# Patient Record
Sex: Male | Born: 1940 | Race: White | Hispanic: No | Smoking: Former smoker
Health system: Southern US, Community
[De-identification: ages and names within clinical notes are randomized; demographics above are authoritative.]

## PROBLEM LIST (undated history)

## (undated) DIAGNOSIS — I2510 Atherosclerotic heart disease of native coronary artery without angina pectoris: Secondary | ICD-10-CM

## (undated) DIAGNOSIS — Z8709 Personal history of other diseases of the respiratory system: Secondary | ICD-10-CM

## (undated) DIAGNOSIS — N183 Chronic kidney disease, stage 3 unspecified: Secondary | ICD-10-CM

## (undated) DIAGNOSIS — M1990 Unspecified osteoarthritis, unspecified site: Secondary | ICD-10-CM

## (undated) DIAGNOSIS — I10 Essential (primary) hypertension: Secondary | ICD-10-CM

## (undated) DIAGNOSIS — I714 Abdominal aortic aneurysm, without rupture, unspecified: Secondary | ICD-10-CM

## (undated) DIAGNOSIS — C649 Malignant neoplasm of unspecified kidney, except renal pelvis: Secondary | ICD-10-CM

## (undated) DIAGNOSIS — G4730 Sleep apnea, unspecified: Secondary | ICD-10-CM

## (undated) DIAGNOSIS — J449 Chronic obstructive pulmonary disease, unspecified: Secondary | ICD-10-CM

## (undated) DIAGNOSIS — G43909 Migraine, unspecified, not intractable, without status migrainosus: Secondary | ICD-10-CM

## (undated) DIAGNOSIS — B029 Zoster without complications: Secondary | ICD-10-CM

## (undated) DIAGNOSIS — E785 Hyperlipidemia, unspecified: Secondary | ICD-10-CM

## (undated) DIAGNOSIS — F32A Depression, unspecified: Secondary | ICD-10-CM

## (undated) DIAGNOSIS — I639 Cerebral infarction, unspecified: Secondary | ICD-10-CM

## (undated) DIAGNOSIS — K219 Gastro-esophageal reflux disease without esophagitis: Secondary | ICD-10-CM

## (undated) DIAGNOSIS — F329 Major depressive disorder, single episode, unspecified: Secondary | ICD-10-CM

## (undated) DIAGNOSIS — I5022 Chronic systolic (congestive) heart failure: Secondary | ICD-10-CM

## (undated) DIAGNOSIS — C4491 Basal cell carcinoma of skin, unspecified: Secondary | ICD-10-CM

## (undated) DIAGNOSIS — I482 Chronic atrial fibrillation, unspecified: Secondary | ICD-10-CM

## (undated) DIAGNOSIS — Z87442 Personal history of urinary calculi: Secondary | ICD-10-CM

## (undated) DIAGNOSIS — I219 Acute myocardial infarction, unspecified: Secondary | ICD-10-CM

## (undated) HISTORY — DX: Hyperlipidemia, unspecified: E78.5

## (undated) HISTORY — DX: Depression, unspecified: F32.A

## (undated) HISTORY — DX: Essential (primary) hypertension: I10

## (undated) HISTORY — DX: Malignant neoplasm of unspecified kidney, except renal pelvis: C64.9

## (undated) HISTORY — DX: Major depressive disorder, single episode, unspecified: F32.9

## (undated) HISTORY — DX: Atherosclerotic heart disease of native coronary artery without angina pectoris: I25.10

## (undated) HISTORY — DX: Abdominal aortic aneurysm, without rupture, unspecified: I71.40

## (undated) HISTORY — DX: Gastro-esophageal reflux disease without esophagitis: K21.9

## (undated) HISTORY — PX: LITHOTRIPSY: SUR834

## (undated) HISTORY — DX: Personal history of other diseases of the respiratory system: Z87.09

## (undated) HISTORY — DX: Abdominal aortic aneurysm, without rupture: I71.4

## (undated) HISTORY — PX: COLONOSCOPY: SHX174

## (undated) HISTORY — PX: KNEE ARTHROSCOPY: SHX127

---

## 1962-05-08 HISTORY — PX: TONSILLECTOMY: SUR1361

## 1997-11-05 ENCOUNTER — Ambulatory Visit (HOSPITAL_COMMUNITY): Admission: RE | Admit: 1997-11-05 | Discharge: 1997-11-05 | Payer: Self-pay | Admitting: Orthopedic Surgery

## 1997-11-17 ENCOUNTER — Ambulatory Visit (HOSPITAL_COMMUNITY): Admission: RE | Admit: 1997-11-17 | Discharge: 1997-11-17 | Payer: Self-pay | Admitting: Gastroenterology

## 2002-09-22 LAB — HM COLONOSCOPY: HM Colonoscopy: NORMAL

## 2004-01-06 ENCOUNTER — Emergency Department (HOSPITAL_COMMUNITY): Admission: EM | Admit: 2004-01-06 | Discharge: 2004-01-06 | Payer: Self-pay | Admitting: *Deleted

## 2004-05-08 DIAGNOSIS — I219 Acute myocardial infarction, unspecified: Secondary | ICD-10-CM

## 2004-05-08 HISTORY — DX: Acute myocardial infarction, unspecified: I21.9

## 2004-07-01 ENCOUNTER — Encounter: Admission: RE | Admit: 2004-07-01 | Discharge: 2004-07-01 | Payer: Self-pay | Admitting: *Deleted

## 2004-07-04 ENCOUNTER — Ambulatory Visit (HOSPITAL_COMMUNITY): Admission: RE | Admit: 2004-07-04 | Discharge: 2004-07-06 | Payer: Self-pay | Admitting: *Deleted

## 2004-07-04 HISTORY — PX: CARDIAC CATHETERIZATION: SHX172

## 2004-07-05 HISTORY — PX: CORONARY ANGIOPLASTY WITH STENT PLACEMENT: SHX49

## 2008-09-30 ENCOUNTER — Encounter: Admission: RE | Admit: 2008-09-30 | Discharge: 2008-11-05 | Payer: Self-pay | Admitting: Orthopedic Surgery

## 2008-11-11 ENCOUNTER — Ambulatory Visit: Payer: Self-pay | Admitting: Internal Medicine

## 2008-11-11 DIAGNOSIS — I2510 Atherosclerotic heart disease of native coronary artery without angina pectoris: Secondary | ICD-10-CM | POA: Insufficient documentation

## 2008-11-11 DIAGNOSIS — E785 Hyperlipidemia, unspecified: Secondary | ICD-10-CM

## 2008-11-11 DIAGNOSIS — Z87442 Personal history of urinary calculi: Secondary | ICD-10-CM

## 2008-11-11 DIAGNOSIS — K219 Gastro-esophageal reflux disease without esophagitis: Secondary | ICD-10-CM | POA: Insufficient documentation

## 2008-11-11 DIAGNOSIS — Z85828 Personal history of other malignant neoplasm of skin: Secondary | ICD-10-CM

## 2008-11-11 DIAGNOSIS — I10 Essential (primary) hypertension: Secondary | ICD-10-CM | POA: Insufficient documentation

## 2008-11-11 DIAGNOSIS — F329 Major depressive disorder, single episode, unspecified: Secondary | ICD-10-CM

## 2008-11-18 ENCOUNTER — Ambulatory Visit: Payer: Self-pay | Admitting: Cardiology

## 2008-11-18 ENCOUNTER — Encounter: Payer: Self-pay | Admitting: Cardiology

## 2008-11-26 ENCOUNTER — Telehealth (INDEPENDENT_AMBULATORY_CARE_PROVIDER_SITE_OTHER): Payer: Self-pay | Admitting: Radiology

## 2008-11-30 ENCOUNTER — Ambulatory Visit: Payer: Self-pay

## 2008-11-30 ENCOUNTER — Encounter: Payer: Self-pay | Admitting: Cardiovascular Disease

## 2009-03-01 ENCOUNTER — Telehealth: Payer: Self-pay | Admitting: Internal Medicine

## 2009-03-13 ENCOUNTER — Emergency Department (HOSPITAL_BASED_OUTPATIENT_CLINIC_OR_DEPARTMENT_OTHER): Admission: EM | Admit: 2009-03-13 | Discharge: 2009-03-13 | Payer: Self-pay | Admitting: Emergency Medicine

## 2009-03-13 ENCOUNTER — Ambulatory Visit: Payer: Self-pay | Admitting: Diagnostic Radiology

## 2009-04-05 ENCOUNTER — Telehealth: Payer: Self-pay | Admitting: Internal Medicine

## 2009-05-08 HISTORY — PX: ABDOMINAL AORTIC ANEURYSM REPAIR: SUR1152

## 2009-05-13 ENCOUNTER — Ambulatory Visit: Payer: Self-pay | Admitting: Internal Medicine

## 2009-05-13 ENCOUNTER — Ambulatory Visit (HOSPITAL_BASED_OUTPATIENT_CLINIC_OR_DEPARTMENT_OTHER): Admission: RE | Admit: 2009-05-13 | Discharge: 2009-05-13 | Payer: Self-pay | Admitting: Internal Medicine

## 2009-05-13 ENCOUNTER — Ambulatory Visit: Payer: Self-pay | Admitting: Diagnostic Radiology

## 2009-05-13 DIAGNOSIS — R066 Hiccough: Secondary | ICD-10-CM | POA: Insufficient documentation

## 2009-05-13 DIAGNOSIS — R05 Cough: Secondary | ICD-10-CM | POA: Insufficient documentation

## 2009-05-14 ENCOUNTER — Ambulatory Visit: Payer: Self-pay | Admitting: Internal Medicine

## 2009-05-14 ENCOUNTER — Ambulatory Visit: Payer: Self-pay | Admitting: Diagnostic Radiology

## 2009-05-14 ENCOUNTER — Telehealth: Payer: Self-pay | Admitting: Internal Medicine

## 2009-05-14 ENCOUNTER — Ambulatory Visit (HOSPITAL_BASED_OUTPATIENT_CLINIC_OR_DEPARTMENT_OTHER): Admission: RE | Admit: 2009-05-14 | Discharge: 2009-05-14 | Payer: Self-pay | Admitting: Internal Medicine

## 2009-05-14 LAB — CONVERTED CEMR LAB
AST: 33 units/L (ref 0–37)
Albumin: 4.1 g/dL (ref 3.5–5.2)
BUN: 33 mg/dL — ABNORMAL HIGH (ref 6–23)
Basophils Relative: 1 % (ref 0–1)
Bilirubin, Direct: 0.2 mg/dL (ref 0.0–0.3)
Cholesterol: 160 mg/dL (ref 0–200)
Creatinine, Ser: 1.69 mg/dL — ABNORMAL HIGH (ref 0.40–1.50)
Eosinophils Absolute: 0.1 10*3/uL (ref 0.0–0.7)
Hemoglobin: 13 g/dL (ref 13.0–17.0)
MCV: 91.1 fL (ref 78.0–100.0)
Neutro Abs: 3.8 10*3/uL (ref 1.7–7.7)
Neutrophils Relative %: 60 % (ref 43–77)
RBC: 4.48 M/uL (ref 4.22–5.81)
RDW: 14.9 % (ref 11.5–15.5)
Sodium: 143 meq/L (ref 135–145)
TSH: 1.678 microintl units/mL (ref 0.350–4.500)
Total CHOL/HDL Ratio: 3.6
Total Protein: 7.1 g/dL (ref 6.0–8.3)
Triglycerides: 89 mg/dL (ref <150)
VLDL: 18 mg/dL (ref 0–40)
WBC: 6.2 10*3/uL (ref 4.0–10.5)

## 2009-05-17 ENCOUNTER — Encounter: Payer: Self-pay | Admitting: Internal Medicine

## 2009-05-28 ENCOUNTER — Ambulatory Visit: Payer: Self-pay | Admitting: Interventional Radiology

## 2009-05-28 ENCOUNTER — Ambulatory Visit: Payer: Self-pay | Admitting: Internal Medicine

## 2009-05-28 ENCOUNTER — Ambulatory Visit (HOSPITAL_BASED_OUTPATIENT_CLINIC_OR_DEPARTMENT_OTHER): Admission: RE | Admit: 2009-05-28 | Discharge: 2009-05-28 | Payer: Self-pay | Admitting: Internal Medicine

## 2009-05-28 DIAGNOSIS — I714 Abdominal aortic aneurysm, without rupture: Secondary | ICD-10-CM

## 2009-05-28 DIAGNOSIS — N259 Disorder resulting from impaired renal tubular function, unspecified: Secondary | ICD-10-CM

## 2009-05-28 LAB — CONVERTED CEMR LAB
Bilirubin Urine: NEGATIVE
Calcium: 9 mg/dL (ref 8.4–10.5)
Glucose, Bld: 86 mg/dL (ref 70–99)
Ketones, ur: NEGATIVE mg/dL
Leukocytes, UA: NEGATIVE
Nitrite: NEGATIVE
Potassium: 3.7 meq/L (ref 3.5–5.3)
Protein, ur: NEGATIVE mg/dL
Sodium: 136 meq/L (ref 135–145)
Urobilinogen, UA: 0.2 (ref 0.0–1.0)

## 2009-05-29 ENCOUNTER — Encounter: Payer: Self-pay | Admitting: Internal Medicine

## 2009-05-31 ENCOUNTER — Ambulatory Visit: Payer: Self-pay | Admitting: Surgery

## 2009-05-31 ENCOUNTER — Encounter: Admission: RE | Admit: 2009-05-31 | Discharge: 2009-05-31 | Payer: Self-pay | Admitting: Surgery

## 2009-05-31 ENCOUNTER — Encounter: Payer: Self-pay | Admitting: Internal Medicine

## 2009-06-02 ENCOUNTER — Telehealth (INDEPENDENT_AMBULATORY_CARE_PROVIDER_SITE_OTHER): Payer: Self-pay | Admitting: *Deleted

## 2009-06-03 ENCOUNTER — Inpatient Hospital Stay (HOSPITAL_COMMUNITY): Admission: AD | Admit: 2009-06-03 | Discharge: 2009-06-04 | Payer: Self-pay | Admitting: Surgery

## 2009-06-03 ENCOUNTER — Ambulatory Visit: Payer: Self-pay | Admitting: Surgery

## 2009-06-04 ENCOUNTER — Telehealth: Payer: Self-pay | Admitting: Internal Medicine

## 2009-06-18 ENCOUNTER — Telehealth: Payer: Self-pay | Admitting: Internal Medicine

## 2009-06-28 ENCOUNTER — Ambulatory Visit: Payer: Self-pay | Admitting: Surgery

## 2009-06-28 ENCOUNTER — Encounter: Payer: Self-pay | Admitting: Cardiology

## 2009-06-28 ENCOUNTER — Encounter: Admission: RE | Admit: 2009-06-28 | Discharge: 2009-06-28 | Payer: Self-pay | Admitting: Surgery

## 2009-06-29 ENCOUNTER — Ambulatory Visit: Payer: Self-pay | Admitting: Internal Medicine

## 2009-06-29 LAB — CONVERTED CEMR LAB
Chloride: 101 meq/L (ref 96–112)
Creatinine, Ser: 1.55 mg/dL — ABNORMAL HIGH (ref 0.40–1.50)
Potassium: 4.1 meq/L (ref 3.5–5.3)

## 2009-06-30 ENCOUNTER — Encounter: Payer: Self-pay | Admitting: Internal Medicine

## 2009-08-05 ENCOUNTER — Ambulatory Visit: Payer: Self-pay | Admitting: Vascular Surgery

## 2009-08-30 ENCOUNTER — Ambulatory Visit: Payer: Self-pay | Admitting: Surgery

## 2009-09-27 ENCOUNTER — Ambulatory Visit: Payer: Self-pay | Admitting: Surgery

## 2009-09-27 ENCOUNTER — Encounter: Payer: Self-pay | Admitting: Internal Medicine

## 2009-09-27 ENCOUNTER — Encounter: Payer: Self-pay | Admitting: Cardiology

## 2009-10-13 ENCOUNTER — Encounter: Payer: Self-pay | Admitting: Internal Medicine

## 2009-12-14 ENCOUNTER — Encounter: Payer: Self-pay | Admitting: Internal Medicine

## 2009-12-21 ENCOUNTER — Ambulatory Visit: Payer: Self-pay | Admitting: Internal Medicine

## 2009-12-21 DIAGNOSIS — N400 Enlarged prostate without lower urinary tract symptoms: Secondary | ICD-10-CM | POA: Insufficient documentation

## 2009-12-27 ENCOUNTER — Encounter: Admission: RE | Admit: 2009-12-27 | Discharge: 2009-12-27 | Payer: Self-pay | Admitting: Surgery

## 2010-02-22 ENCOUNTER — Encounter: Payer: Self-pay | Admitting: Internal Medicine

## 2010-02-22 ENCOUNTER — Telehealth: Payer: Self-pay | Admitting: Internal Medicine

## 2010-02-22 LAB — CONVERTED CEMR LAB
ALT: 13 units/L (ref 0–53)
BUN: 29 mg/dL — ABNORMAL HIGH (ref 6–23)
Chloride: 103 meq/L (ref 96–112)
Creatinine, Ser: 1.53 mg/dL — ABNORMAL HIGH (ref 0.40–1.50)
Glucose, Bld: 95 mg/dL (ref 70–99)
Sodium: 139 meq/L (ref 135–145)

## 2010-02-25 ENCOUNTER — Ambulatory Visit: Payer: Self-pay | Admitting: Internal Medicine

## 2010-02-25 LAB — CONVERTED CEMR LAB
HDL: 37 mg/dL — ABNORMAL LOW (ref >39)
VLDL: 21 mg/dL (ref 0–40)

## 2010-05-12 ENCOUNTER — Ambulatory Visit
Admission: RE | Admit: 2010-05-12 | Discharge: 2010-05-12 | Payer: Self-pay | Source: Home / Self Care | Attending: Internal Medicine | Admitting: Internal Medicine

## 2010-05-12 LAB — CONVERTED CEMR LAB
BUN: 25 mg/dL — ABNORMAL HIGH (ref 6–23)
Calcium: 8.8 mg/dL (ref 8.4–10.5)
Chloride: 101 meq/L (ref 96–112)
Creatinine, Ser: 1.4 mg/dL (ref 0.40–1.50)
Potassium: 3.8 meq/L (ref 3.5–5.3)

## 2010-05-13 ENCOUNTER — Encounter: Payer: Self-pay | Admitting: Internal Medicine

## 2010-05-27 ENCOUNTER — Telehealth: Payer: Self-pay | Admitting: Internal Medicine

## 2010-06-09 NOTE — Progress Notes (Signed)
Summary: Pending ABN  Phone Note Other Incoming   Caller: Katrina @ [facility] ext [phone] Summary of Call: Received call from [place] in the lab stating pt had labs drawn today but an ABN printed stating labs were denied as too frequent. I forwarded a copy of the ABN to you for review. Please let me know what needs to be done with the blood that was drawn. Katrina is holding it until she hears back from [place].  Please advise. Nicki Guadalajara Fergerson CMA Duncan Dull)  February 22, 2010 11:27 AM

## 2010-06-09 NOTE — Progress Notes (Signed)
Summary: Lab Results  Phone Note Outgoing Call   Summary of Call: call pt - CT of chest negative for signs of malignancy.  It shows changes suggest of COPD.  inform pt kidney function blood test is slightly elevated.  please make sure he is not taking any NSAIDs.  I suggest he increase fluid intake, hold triamternene/hctz and f/u in 2 weeks Initial call taken by: D. Thomos Lemons DO,  May 14, 2009 1:04 PM  Follow-up for Phone Call        attempted to contact patient at [phone] no answer, detailed voice message left advising patient per Dr Artist Pais instructions. Message was left for patient to call back and schedule 2 week follow up Follow-up by: Glendell Docker CMA,  May 14, 2009 1:16 PM    New/Updated Medications: TRIAMTERENE-HCTZ 37.5-25 MG TABS (TRIAMTERENE-HCTZ) Take 1 tablet by mouth every morning (HOLD)

## 2010-06-09 NOTE — Assessment & Plan Note (Signed)
Summary: 2 month follow up/mhf rsc with pt from [place]/mhf   Vital Signs:  Patient profile:   70 year old male Height:      71 inches Weight:      228 pounds BMI:     31.91 O2 Sat:      93 % on Room air Temp:     98.2 degrees F oral Pulse rate:   73 / minute Resp:     18 per minute BP sitting:   110 / 80  (right arm) Cuff size:   large  Vitals Entered By: Glendell Docker CMA (May 12, 2010 10:42 AM)  O2 Flow:  Room air CC: 2 Month Follow up  Is Patient Diabetic? No Pain Assessment Patient in pain? no      Comments refill on medication to mail order   Primary Care Provider:  D. Thomos Lemons DO  CC:  2 Month Follow up .  History of Present Illness:  Hypertension Follow-Up      This is a 70 year old man who presents for Hypertension follow-up.  The patient denies lightheadedness and headaches.  The patient denies the following associated symptoms: chest pain.  Compliance with medications (by patient report) has been near 100%.  The patient reports that dietary compliance has been fair.  The patient reports exercising occasionally.    hyperlipidemia -stable.  denies myalgias  Preventive Screening-Counseling & Management  Alcohol-Tobacco     Smoking Status: quit  Allergies: 1)  ! * Intolerance Ace Inhibitors  Past History:  Past Medical History: Current Problems:  HYPERTENSION (ICD-401.9) HYPERLIPIDEMIA (ICD-272.4)   CORONARY ARTERY DISEASE (ICD-414.00)  FAMILY HISTORY OF CAD MALE 1ST DEGREE RELATIVE <60 (ICD-V16.49) NEPHROLITHIASIS, HX OF (ICD-V13.01) GERD (ICD-530.81) DEPRESSION (ICD-311)  SKIN CANCER, HX OF (ICD-V10.83) H/O pnemothorax AAA 9.1 cm - S/P repair 05/2009   Past Surgical History: cardiac Cath 06/2004   PROCEDURE:  Percutaneous coronary intervention, drug-eluting stent implantationproximal left anterior descending. Percutaneous coronary intervention, balloon dilatation only, first diagonal branch.Percutaneous closure right femoral  artery.ANGIOGRAPHY:  The lesion was treated with a complex LAD diagonal stenosis.  The diagonal had an 80% stenosis at its origin and then was patent for about 1 cm and then there was a 70-80% stenosis that extended about 1 cm.  The vessel was moderate in size.  The anterior descending artery itself had a90% diffuse stenosis.  Following balloon dilatation and stent implantation, there was no residual stenosis in the anterior descending artery and there was an 80% stenosis at the origin of the diagonal branch.  There was some dissection, nonpropagating and non-occlusive, seen about 10-15 mm more distally in the diagonal.  TIMI-3 flow was present throughout.HE/MEDQ  D:  07/05/2004  T:  07/06/2004  Job:  [id number] cc:   Tasia Catchings, M.D. [address]  [address] [place] [address] Fax: [phone] Colonoscopy early 30's - negative for polyp  Tonsillectomy Lithotripsy Arthroscopic knee surgery 9.1-cm infrarenal abdominal aortic aneurysm status post stent graft repair.   Social History: Retired Divorced 2 sons 23 &30   Occupation:  Part time sports Corporate investment banker  Smoking Status:  quit (08/24/1980) Packs/Day:  1.0  Caffeine use/day:  3 beverages daily  Does Patient Exercise:  no Alcohol Use - yes  Physical Exam  General:  alert, well-developed, and well-nourished.   Lungs:  normal respiratory effort and normal breath sounds.   Heart:  normal rate, regular rhythm, and no gallop.   Extremities:  trace left pedal edema and trace  right pedal edema.   Neurologic:  cranial nerves II-XII intact and gait normal.   Psych:  normally interactive, good eye contact, not anxious appearing, and not depressed appearing.     Impression & Recommendations:  Problem # 1:  HYPERTENSION (ICD-401.9) Assessment Improved  His updated medication list for this problem includes:    Amlodipine Besylate 10 Mg Tabs (Amlodipine besylate) .Marland Kitchen... Take one half  tablet by mouth daily    Triamterene-hctz 37.5-25 Mg Tabs  (Triamterene-hctz) .Marland Kitchen... Take 1/2  tablet by mouth every morning    Metoprolol Tartrate 100 Mg Tabs (Metoprolol tartrate) .Marland Kitchen... 1 tab by mouth two times a day    Losartan Potassium 25 Mg Tabs (Losartan potassium) ..... One by mouth once daily  Orders: T-Basic Metabolic Panel [id number])  BP today: 110/80 Prior BP: 122/78 (02/25/2010)  Labs Reviewed: K+: 3.9 (02/22/2010) Creat: : 1.53 (02/22/2010)   Chol: 136 (02/25/2010)   HDL: 37 (02/25/2010)   LDL: 78 (02/25/2010)   TG: 106 (02/25/2010)  Problem # 2:  HYPERLIPIDEMIA (ICD-272.4)  His updated medication list for this problem includes:    Simvastatin 20 Mg Tabs (Simvastatin) ..... One by mouth q pm  Labs Reviewed: SGOT: 22 (02/22/2010)   SGPT: 13 (02/22/2010)   HDL:37 (02/25/2010), 44 (05/14/2009)  LDL:78 (02/25/2010), 98 (05/14/2009)  Chol:136 (02/25/2010), 160 (05/14/2009)  Trig:106 (02/25/2010), 89 (05/14/2009)  Problem # 3:  DEPRESSION (ICD-311) Assessment: Unchanged Maintain current medication regimen.  His updated medication list for this problem includes:    Sertraline Hcl 100 Mg Tabs (Sertraline hcl) ..... One by mouth once daily  Complete Medication List: 1)  Multivitamins Tabs (Multiple vitamin) .... Take 1 tablet by mouth once a day 2)  Vitamin C 500 Mg Tabs (Ascorbic acid) .... Take 1 tablet by mouth once a day 3)  Fish Oil 1200 Mg Caps (Omega-3 fatty acids) .... Take 1 tablet by mouth once a day 4)  Amlodipine Besylate 10 Mg Tabs (Amlodipine besylate) .... Take one half  tablet by mouth daily 5)  Isosorbide Dinitrate 20 Mg Tabs (Isosorbide dinitrate) .... Take 1 tablet by mouth three times a day 6)  Triamterene-hctz 37.5-25 Mg Tabs (Triamterene-hctz) .... Take 1/2  tablet by mouth every morning 7)  Aspirin 81 Mg Tabs (Aspirin) .... Take 1 tablet by mouth four times a day 8)  Sertraline Hcl 100 Mg Tabs (Sertraline hcl) .... One by mouth once daily 9)  Metoprolol Tartrate 100 Mg Tabs (Metoprolol tartrate) .Marland Kitchen.. 1 tab  by mouth two times a day 10)  Ranitidine Hcl 150 Mg Tabs (Ranitidine hcl) .... One by mouth bid 11)  Pa Melatonin 5 Mg Tabs (Melatonin) .... Take 1 tab by mouth at bedtime 12)  Simvastatin 20 Mg Tabs (Simvastatin) .... One by mouth q pm 13)  Losartan Potassium 25 Mg Tabs (Losartan potassium) .... One by mouth once daily 14)  Co Q-10 100 Mg Caps (Coenzyme q10) .... Take 1 capsule by mouth once a day  Patient Instructions: 1)  Please schedule a follow-up appointment in 6 months for CPX Prescriptions: SIMVASTATIN 20 MG TABS (SIMVASTATIN) one by mouth q pm  #90 x 1   Entered and Authorized by:   D. Thomos Lemons DO   Signed by:   D. Thomos Lemons DO on 05/12/2010   Method used:   Faxed to ...       [facility] (mail-order)       [address]       [place], [place]  [id number]  Ph: [phone]       Fax: [phone]   RxID:   [id number] METOPROLOL TARTRATE 100 MG TABS (METOPROLOL TARTRATE) 1 tab by mouth two times a day  #180 x 1   Entered and Authorized by:   D. Thomos Lemons DO   Signed by:   D. Thomos Lemons DO on 05/12/2010   Method used:   Faxed to ...       [facility] (mail-order)       [address]       [place], [place]  [id number]       Ph: [phone]       Fax: [phone]   RxID:   [id number] SERTRALINE HCL 100 MG TABS (SERTRALINE HCL) one by mouth once daily  #90 x 1   Entered and Authorized by:   D. Thomos Lemons DO   Signed by:   D. Thomos Lemons DO on 05/12/2010   Method used:   Faxed to ...       [facility] (mail-order)       [address]       [place], [place]  [id number]       Ph: [phone]       Fax: [phone]   RxID:   [id number] TRIAMTERENE-HCTZ 37.5-25 MG TABS (TRIAMTERENE-HCTZ) Take 1/2  tablet by mouth every morning  #45 x 01   Entered and Authorized by:   D. Thomos Lemons DO   Signed by:   D. Thomos Lemons DO on 05/12/2010   Method used:   Faxed to ...       [facility] (mail-order)        [address]       [place], [place]  [id number]       Ph: [phone]       Fax: [phone]   RxID:   [id number] ISOSORBIDE DINITRATE 20 MG TABS (ISOSORBIDE DINITRATE) Take 1 tablet by mouth three times a day  #270 x 1   Entered and Authorized by:   D. Thomos Lemons DO   Signed by:   D. Thomos Lemons DO on 05/12/2010   Method used:   Faxed to ...       [facility] (mail-order)       [address]       [place], [place]  [id number]       Ph: [phone]       Fax: [phone]   RxID:   [id number] AMLODIPINE BESYLATE 10 MG TABS (AMLODIPINE BESYLATE) Take one half  tablet by mouth daily  #45 x 1   Entered and Authorized by:   D. Thomos Lemons DO   Signed by:   D. Thomos Lemons DO on 05/12/2010   Method used:   Faxed to ...       [facility] (mail-order)       [address]       [place], [place]  [id number]       Ph: [phone]       Fax: [phone]   RxID:   [id number]    Orders Added: 1)  T-Basic Metabolic Panel [phone] [id number])  Est. Patient Level III [[address]]    Current Allergies (reviewed today): ! * INTOLERANCE ACE INHIBITORS

## 2010-06-09 NOTE — Medication Information (Signed)
Summary: Nonadherence with Sertraline/[facility]  Nonadherence with Sertraline/[facility]   Imported By: Lanelle Bal 11/03/2009 08:34:10  _____________________________________________________________________  External Attachment:    Type:   Image     Comment:   External Document

## 2010-06-09 NOTE — Letter (Signed)
Summary: [facility]  [facility]   Imported By: Debby Freiberg 11/10/2009 14:47:28  _____________________________________________________________________  External Attachment:    Type:   Image     Comment:   External Document

## 2010-06-09 NOTE — Assessment & Plan Note (Signed)
Summary: 2 wk. f/u on cxr- jr   Vital Signs:  Patient profile:   70 year old male Weight:      225.50 pounds BMI:     31.56 Temp:     97.8 degrees F oral Pulse rate:   85 / minute Pulse rhythm:   regular Resp:     95 per minute BP sitting:   136 / 90  (left arm) Cuff size:   large  Vitals Entered By: Glendell Docker CMA (May 28, 2009 9:27 AM)  O2 Flow:  Room air  Primary Care Provider:  D. Thomos Lemons DO  CC:  2 Week Follow up.  History of Present Illness: 2 Week follow up Hiccups resolved  70 y/o white male with hx of CAD, Htn, and tobacco abuse for follow up. Hiccups resolved.  still has occasional cough. reviewed CT of chest -   1.  COPD/emphysema.  No acute cardiopulmonary disease. 2.  Borderline heart size.  Three-vessel coronary artery calcification and LAD stent. 3.  Borderline hiatal hernia.  New renal insuff - pt denies chronic use of NSAIDs.    no dehydrating illness  Allergies: 1)  ! * Intolerance Ace Inhibitors  Past History:  Past Medical History: Current Problems:  HYPERTENSION (ICD-401.9) HYPERLIPIDEMIA (ICD-272.4)  CORONARY ARTERY DISEASE (ICD-414.00) FAMILY HISTORY OF CAD MALE 1ST DEGREE RELATIVE <60 (ICD-V16.49) NEPHROLITHIASIS, HX OF (ICD-V13.01) GERD (ICD-530.81) DEPRESSION (ICD-311)  SKIN CANCER, HX OF (ICD-V10.83) H/O pnemothorax  Past Surgical History: cardiac Cath 06/2004  PROCEDURE:  Percutaneous coronary intervention, drug-eluting stent implantationproximal left anterior descending. Percutaneous coronary intervention, balloon dilatation only, first diagonal branch.Percutaneous closure right femoral artery.ANGIOGRAPHY:  The lesion was treated with a complex LAD diagonal stenosis.  The diagonal had an 80% stenosis at its origin and then was patent for about 1 cm and then there was a 70-80% stenosis that extended about 1 cm.  The vessel was moderate in size.  The anterior descending artery itself had a90% diffuse stenosis.   Following balloon dilatation and stent implantation, there was no residual stenosis in the anterior descending artery and there was an 80% stenosis at the origin of the diagonal branch.  There was some dissection, nonpropagating and non-occlusive, seen about 10-15 mm more distally in the diagonal.  TIMI-3 flow was present throughout.HE/MEDQ  D:  07/05/2004  T:  07/06/2004  Job:  [id number] cc:   Tasia Catchings, M.D. 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    Physical Exam  General:  alert, well-developed, and well-nourished.   Neck:  supple, no masses, and no carotid bruits.   Lungs:  normal respiratory effort and normal breath sounds.   Heart:  normal rate, regular rhythm, and no gallop.   Abdomen:  soft, non-tender, normal bowel sounds, and no  masses.   Extremities:  No lower extremity edema  Neurologic:  cranial nerves II-XII intact and gait normal.   Psych:  normally interactive and good eye contact.     Impression & Recommendations:  Problem # 1:  HICCUPS (ICD-786.8) Assessment Improved Resolved.  CT of chest negative for malignancy.  Problem # 2:  RENAL INSUFFICIENCY (ICD-588.9) Baseline Cr from 07/2004 1.1.  He denies hx of chronic NSAID use.  he takes occ BC  powder for headache.  Repeat BMET, U/A, renal ultrasound.  Pt expressed understanding that he will avoid NSADs and dehydration.  Hold maxzide. He did not hold maxzide as prev instructed.  Orders: T-Basic Metabolic Panel [id number]) T-Urinalysis [id number]) Ultrasound (Ultrasound)  Problem # 3:  ABDOMINAL AORTIC ANEURYSM (ICD-441.4)  Renal U/S shows very large AAA 9.2 cm.  Pt not having any abd pain.  We discussed risk of rupture.  Discussed case with Dr. Edilia Bo.  Pt advised to cancel his trip to [place].   Arrange early f/u with vascular surgery next week.  Pt advised to avoid strenuous activity and take BP meds as usual.  Orders: Vascular Clinic (Vascular)  Problem # 4:  HYPERTENSION (ICD-401.9) Assessment: Improved Maintain current medication regimen.  His updated medication list for this problem includes:    Amlodipine Besylate 10 Mg Tabs (Amlodipine besylate) .Marland Kitchen... Take one tablet by mouth daily    Triamterene-hctz 37.5-25 Mg Tabs (Triamterene-hctz) .Marland Kitchen... Take 1 tablet by mouth every morning    Metoprolol Tartrate 100 Mg Tabs (Metoprolol tartrate) .Marland Kitchen... 1 tab by mouth two times a day  BP today: 136/90 Prior BP: 142/100 (05/13/2009)  Labs Reviewed: K+: 3.8 (05/14/2009) Creat: : 1.69 (05/14/2009)   Chol: 160 (05/14/2009)   HDL: 44 (05/14/2009)   LDL: 98 (05/14/2009)   TG: 89 (05/14/2009)  Complete Medication List: 1)  Multivitamins Tabs (Multiple vitamin) .... Take 1 tablet by mouth once a day 2)  Vitamin C 500 Mg Tabs (Ascorbic acid) .... Take 1 tablet by mouth once a day 3)  Fish Oil 1200 Mg Caps (Omega-3 fatty acids) .... Take 1 tablet by mouth once a day 4)  Amlodipine Besylate 10 Mg Tabs (Amlodipine besylate) .... Take one tablet by mouth daily 5)  Isosorbide Dinitrate 20 Mg Tabs (Isosorbide dinitrate) .... Take 1 tablet by mouth three times a day 6)  Simvastatin 20 Mg Tabs (Simvastatin) .... Take 1 tablet by mouth once a day 7)  Triamterene-hctz 37.5-25 Mg Tabs  (Triamterene-hctz) .... Take 1 tablet by mouth every morning 8)  Aspirin 81 Mg Tabs (Aspirin) .... Take 1 tablet by mouth four times a day 9)  Sertraline Hcl 100 Mg Tabs (Sertraline hcl) .... One by mouth once daily 10)  Metoprolol Tartrate 100 Mg Tabs (Metoprolol tartrate) .Marland Kitchen.. 1 tab by mouth two times a day 11)  Polyethylene Glycol 8000 Powd (Polyethylene glycol 8000) .... Once daily 12)  Plavix 75 Mg Tabs (Clopidogrel bisulfate) .... Take 1 tablet by mouth once a day 13)  Chlorpromazine Hcl 25 Mg Tabs (Chlorpromazine hcl) .... One by mouth three times a day  Patient Instructions: 1)  Avoid NSAIDs 2)  Avoid dehydration 3)  Please schedule a follow-up appointment in 2 months.    Contraindications/Deferment of Procedures/Staging:    Test/Procedure: FLU VAX    Reason for deferment: patient declined   Current Allergies (reviewed today): ! * INTOLERANCE ACE INHIBITORS

## 2010-06-09 NOTE — Assessment & Plan Note (Signed)
Summary: 6 month follow up/mhf   Vital Signs:  Patient profile:   70 year old [name] Height:      71 inches Weight:      223.50 pounds BMI:     31.28 O2 Sat:      95 % on Room air Temp:     98.0 degrees F oral Pulse rate:   56 / minute Pulse rhythm:   regular Resp:     16 per minute BP sitting:   130 / 70  (right arm) Cuff size:   large  Vitals Entered By: Glendell Docker CMA (December 21, 2009 9:28 AM)  O2 Flow:  Room air CC: Rm 2- 6 Month Follow up  Is Patient Diabetic? No Pain Assessment Patient in pain? no      Comments questions about the Melatonin tabs if it is okay to continue to take them, it helps him sleep   Primary Care Tres Grzywacz:  D. Thomos Lemons DO  CC:  Rm 2- 6 Month Follow up .  History of Present Illness:  Hypertension Follow-Up      This is a 70 year old man who presents for Hypertension follow-up.  The patient denies lightheadedness.  The patient denies the following associated symptoms: chest pain.  Compliance with medications (by patient report) has been near 100%.  The patient reports that dietary compliance has been fair.    hyperlipidemia - stable  Preventive Screening-Counseling & Management  Alcohol-Tobacco     Smoking Status: quit  Allergies: 1)  ! * Intolerance Ace Inhibitors  Past History:  Past Medical History: Current Problems:  HYPERTENSION (ICD-401.9) HYPERLIPIDEMIA (ICD-272.4)  CORONARY ARTERY DISEASE (ICD-414.00) FAMILY HISTORY OF CAD [name] 1ST DEGREE RELATIVE <60 (ICD-V16.49) NEPHROLITHIASIS, HX OF (ICD-V13.01) GERD (ICD-530.81) DEPRESSION (ICD-311)  SKIN CANCER, HX OF (ICD-V10.83) H/O pnemothorax AAA 9.1 cm - S/P repair 05/2009   Family History: Family History of CAD [name] 1st degree relative <60 Family History High cholesterol Family History Hypertension Mother died of PE Family History of Prostate CA 1st degree relative Colon CA - no   Father has hx of popliteal aneurysm  No family hx of polycystic kidney disease     Social History: Retired Divorced 2 sons 47 &30   Occupation:  Part time sports Corporate investment banker  Smoking Status:  quit Packs/Day:  1.0  Caffeine use/day:  3 beverages daily  Does Patient Exercise:  no Alcohol Use - yes  Physical Exam  General:  alert, well-developed, and well-nourished.   Lungs:  normal respiratory effort and normal breath sounds.   Heart:  normal rate, regular rhythm, and no gallop.   Extremities:  trace left pedal edema and trace right pedal edema.     Impression & Recommendations:  Problem # 1:  HYPERTENSION (ICD-401.9) add low dose ARB considering renal insuff  His updated medication list for this problem includes:    Amlodipine Besylate 10 Mg Tabs (Amlodipine besylate) .Marland Kitchen... Take one tablet by mouth daily    Triamterene-hctz 37.5-25 Mg Tabs (Triamterene-hctz) .Marland Kitchen... Take 1/2  tablet by mouth every morning    Metoprolol Tartrate 100 Mg Tabs (Metoprolol tartrate) .Marland Kitchen... 1 tab by mouth two times a day    Losartan Potassium 25 Mg Tabs (Losartan potassium) ..... One by mouth once daily  Orders: T-Basic Metabolic Panel ([id number])  BP today: 130/70 Prior BP: 110/72 (06/29/2009)  Labs Reviewed: K+: 4.1 (06/29/2009) Creat: : 1.55 (06/29/2009)   Chol: 160 (05/14/2009)   HDL: 44 (05/14/2009)   LDL: 98 (05/14/2009)  TG: 89 (05/14/2009)  Problem # 2:  HYPERLIPIDEMIA (ICD-272.4) goal LDL < 70  The following medications were removed from the medication list:    Simvastatin 40 Mg Tabs (Simvastatin) ..... One by mouth qpm His updated medication list for this problem includes:    Crestor 20 Mg Tabs (Rosuvastatin calcium) ..... One by mouth once daily  Orders: T-Hepatic Function [id number]) T-Lipid Profile [id number])  Labs Reviewed: SGOT: 33 (05/14/2009)   SGPT: 19 (05/14/2009)   HDL:44 (05/14/2009)  LDL:98 (05/14/2009)  Chol:160 (05/14/2009)  Trig:89 (05/14/2009)  Complete Medication List: 1)  Multivitamins Tabs (Multiple vitamin) .... Take 1 tablet  by mouth once a day 2)  Vitamin C 500 Mg Tabs (Ascorbic acid) .... Take 1 tablet by mouth once a day 3)  Fish Oil 1200 Mg Caps (Omega-3 fatty acids) .... Take 1 tablet by mouth once a day 4)  Amlodipine Besylate 10 Mg Tabs (Amlodipine besylate) .... Take one tablet by mouth daily 5)  Isosorbide Dinitrate 20 Mg Tabs (Isosorbide dinitrate) .... Take 1 tablet by mouth three times a day 6)  Triamterene-hctz 37.5-25 Mg Tabs (Triamterene-hctz) .... Take 1/2  tablet by mouth every morning 7)  Aspirin 81 Mg Tabs (Aspirin) .... Take 1 tablet by mouth four times a day 8)  Sertraline Hcl 100 Mg Tabs (Sertraline hcl) .... One by mouth once daily 9)  Metoprolol Tartrate 100 Mg Tabs (Metoprolol tartrate) .Marland Kitchen.. 1 tab by mouth two times a day 10)  Ranitidine Hcl 150 Mg Tabs (Ranitidine hcl) .... One by mouth bid 11)  Pa Melatonin 5 Mg Tabs (Melatonin) .... Take 1 tab by mouth at bedtime 12)  Crestor 20 Mg Tabs (Rosuvastatin calcium) .... One by mouth once daily 13)  Losartan Potassium 25 Mg Tabs (Losartan potassium) .... One by mouth once daily  Other Orders: T-PSA ([id number])  Patient Instructions: 1)  Please schedule a follow-up appointment in 2 months. Prescriptions: LOSARTAN POTASSIUM 25 MG TABS (LOSARTAN POTASSIUM) one by mouth once daily  #30 x 2   Entered and Authorized by:   D. Thomos Lemons DO   Signed by:   D. Thomos Lemons DO on 12/21/2009   Method used:   Electronically to        [facility]* (retail)       [address] [place], [place]  [address]       Ph: [phone]       Fax: [phone]   RxID:   [id number] CRESTOR 20 MG TABS (ROSUVASTATIN CALCIUM) one by mouth once daily  #90 x 1   Entered and Authorized by:   D. Thomos Lemons DO   Signed by:   D. Thomos Lemons DO on 12/21/2009   Method used:   Electronically to        [facility]* (retail)       [address] [place], [place]  [address]       Ph: [phone]        Fax: [phone]   RxID:   [id number]   Current Allergies (reviewed today): ! * INTOLERANCE ACE INHIBITORS

## 2010-06-09 NOTE — Letter (Signed)
   [place] at [facility] [address] [place], [place]  [address]  [place] Phone: [phone]      June 04, 2009   [facility] [address] [place], [place] [address]  RE:  LAB RESULTS  Dear  Mr. ZINNI,  The following is an interpretation of your most recent lab tests.  Please take note of any instructions provided or changes to medications that have resulted from your lab work.  ELECTROLYTES:  Good - no changes needed  KIDNEY FUNCTION TESTS:  Stable - no changes needed        Sincerely Yours,    Dr. Thomos Lemons

## 2010-06-09 NOTE — Letter (Signed)
   [place] at [facility] [address] [place], [place]  [address]  [place] Phone: [phone]      June 30, 2009   [facility] [address] [place], [place] [address]  RE:  LAB RESULTS  Dear  Mr. SCHREIER,  The following is an interpretation of your most recent lab tests.  Please take note of any instructions provided or changes to medications that have resulted from your lab work.  ELECTROLYTES:  Good - no changes needed  KIDNEY FUNCTION TESTS:  Fair - review at your next visit    I suggest you cut your Triamterene/Hctz dose in half.   As previously discussed,  please avoid over the NSAIDs (ibuprofen, aleve, etc).       Sincerely Yours,    Dr. Thomos Lemons

## 2010-06-09 NOTE — Progress Notes (Signed)
Summary: wants cholesterol checked as well   Phone Note Call from Patient   Caller: Patient Call For: yoo  Summary of Call: He is scheduled for a PSA prior to his visit  He would like his cholestoral checked as well  Initial call taken by: Roselle Locus,  February 22, 2010 9:38 AM  Follow-up for Phone Call        orders entered Follow-up by: Glendell Docker CMA,  February 22, 2010 9:43 AM

## 2010-06-09 NOTE — Letter (Signed)
Summary: [facility]  [facility]   Imported By: Lanelle Bal 10/18/2009 12:14:44  _____________________________________________________________________  External Attachment:    Type:   Image     Comment:   External Document

## 2010-06-09 NOTE — Letter (Signed)
   [place] at [facility] [address] [place], [place]  [address]  [place] Phone: [phone]      May 13, 2010   [facility] [address] [place], [place] [address]  RE:  LAB RESULTS  Dear  Mr. ALCALDE,  The following is an interpretation of your most recent lab tests.  Please take note of any instructions provided or changes to medications that have resulted from your lab work.  ELECTROLYTES:  Good - no changes needed  KIDNEY FUNCTION TESTS:  Good - no changes needed         Sincerely Yours,    Dr. Thomos Lemons  Appended Document:  mailed

## 2010-06-09 NOTE — Letter (Signed)
   [place] at [facility] [address] [place], [place]  [address]  [place] Phone: [phone]      May 17, 2009   [facility] [address] [place], [place] [address]  RE:  LAB RESULTS  Dear  Mark [name],  The following is an interpretation of your most recent lab tests.  Please take note of any instructions provided or changes to medications that have resulted from your lab work.  ELECTROLYTES:  Good - no changes needed  KIDNEY FUNCTION TESTS:  Abnormal - schedule a follow-up appointment  LIVER FUNCTION TESTS:  Good - no changes needed  LIPID PANEL:  Stable - no changes needed Triglyceride: 89   Cholesterol: 160   LDL: 98   HDL: 44   Chol/HDL%:  3.6 Ratio  THYROID STUDIES:  Thyroid studies normal TSH: 1.678     CBC:  Good - no changes needed    I will further discuss your lab results at your next follow up appointment.     Sincerely Yours,    Dr. Thomos Lemons

## 2010-06-09 NOTE — Assessment & Plan Note (Signed)
Summary: 2 month fu/dt--Rm 2   Vital Signs:  Patient profile:   70 year old male Height:      71 inches Weight:      226.25 pounds BMI:     31.67 O2 Sat:      95 % on Room air Temp:     97.9 degrees F oral Pulse rate:   70 / minute Pulse rhythm:   regular Resp:     12 per minute BP sitting:   122 / 78  (right arm) Cuff size:   large  Vitals Entered By: Mervin Kung CMA Duncan Dull) (February 25, 2010 10:39 AM)  O2 Flow:  Room air CC: 2 month follow up. Would like to get Pneumonia vaccine today. Is Patient Diabetic? No Pain Assessment Patient in pain? no        Primary Care Provider:  Dondra Spry DO  CC:  2 month follow up. Would like to get Pneumonia vaccine today.Marland Kitchen  History of Present Illness: 70 y/o white male for f/u he never started crestor due to fear of increasing risk for diabetes finances also playing role  htn - only took losartan x 1 month  Preventive Screening-Counseling & Management  Alcohol-Tobacco     Alcohol drinks/day: 1 daily     Alcohol type: wine     Alcohol Counseling: to decrease amount and/or frequency of alcohol intake     Smoking Status: quit     Packs/Day: 1.0     Year Started: 1959     Year Quit: 1982     Tobacco Counseling: not to resume use of tobacco products  Allergies: 1)  ! * Intolerance Ace Inhibitors  Past History:  Past Medical History: Current Problems:  HYPERTENSION (ICD-401.9) HYPERLIPIDEMIA (ICD-272.4)   CORONARY ARTERY DISEASE (ICD-414.00) FAMILY HISTORY OF CAD MALE 1ST DEGREE RELATIVE <60 (ICD-V16.49) NEPHROLITHIASIS, HX OF (ICD-V13.01) GERD (ICD-530.81) DEPRESSION (ICD-311)  SKIN CANCER, HX OF (ICD-V10.83) H/O pnemothorax AAA 9.1 cm - S/P repair 05/2009   Past Surgical History: cardiac Cath 06/2004   PROCEDURE:  Percutaneous coronary intervention, drug-eluting stent implantationproximal left anterior descending. Percutaneous coronary intervention, balloon dilatation only, first diagonal  Jonte Wollam.Percutaneous closure right femoral artery.ANGIOGRAPHY:  The lesion was treated with a complex LAD diagonal stenosis.  The diagonal had an 80% stenosis at its origin and then was patent for about 1 cm and then there was a 70-80% stenosis that extended about 1 cm.  The vessel was moderate in size.  The anterior descending artery itself had a90% diffuse stenosis.  Following balloon dilatation and stent implantation, there was no residual stenosis in the anterior descending artery and there was an 80% stenosis at the origin of the diagonal Alim Cattell.  There was some dissection, nonpropagating and non-occlusive, seen about 10-15 mm more distally in the diagonal.  TIMI-3 flow was present throughout.HE/MEDQ  D:  07/05/2004  T:  07/06/2004  Job:  [id number] cc:   Tasia Catchings, M.D. [address] [place] [address] Fax: [phone] Colonoscopy early 12's - negative for polyp  Tonsillectomy Lithotripsy Arthroscopic knee surgery 9.1-cm infrarenal abdominal aortic aneurysm status post stent graft repair. PMH-FH-SH reviewed-no changes except otherwise noted  Family History: Family History of CAD Male 1st degree relative <60 Family History High cholesterol Family History Hypertension Mother died of PE Family History of Prostate CA 1st degree relative Colon CA - no   Father has hx of popliteal aneurysm  No family hx of polycystic kidney disease  Physical Exam  General:  alert, well-developed, and well-nourished.   Lungs:  normal respiratory effort and normal breath sounds.   Heart:  normal rate, regular rhythm, and no gallop.   Extremities:  trace left pedal edema and trace right pedal edema.     Impression & Recommendations:  Problem # 1:  HYPERLIPIDEMIA (ICD-272.4) pt never started crestor due to concerns of increase risk of diabetes with crestor  decrease simvastatin dose due to potential interaction with amlodipine  The following medications were removed from the medication  list:    Crestor 20 Mg Tabs (Rosuvastatin calcium) ..... One by mouth once daily His updated medication list for this problem includes:    Simvastatin 20 Mg Tabs (Simvastatin) ..... One by mouth q pm  Labs Reviewed: SGOT: 22 (02/22/2010)   SGPT: 13 (02/22/2010)   HDL:44 (05/14/2009)  LDL:98 (05/14/2009)  Chol:160 (05/14/2009)  Trig:89 (05/14/2009)  Problem # 2:  HYPERTENSION (ICD-401.9) pt only used losartan x 1 month I suggest he use daily lower amlodipine dose due to potential for hypotension  The following medications were removed from the medication list:    Losartan Potassium 25 Mg Tabs (Losartan potassium) ..... One by mouth once daily His updated medication list for this problem includes:    Amlodipine Besylate 10 Mg Tabs (Amlodipine besylate) .Marland Kitchen... Take one half  tablet by mouth daily    Triamterene-hctz 37.5-25 Mg Tabs (Triamterene-hctz) .Marland Kitchen... Take 1/2  tablet by mouth every morning    Metoprolol Tartrate 100 Mg Tabs (Metoprolol tartrate) .Marland Kitchen... 1 tab by mouth two times a day    Losartan Potassium 25 Mg Tabs (Losartan potassium) ..... One by mouth once daily  Complete Medication List: 1)  Multivitamins Tabs (Multiple vitamin) .... Take 1 tablet by mouth once a day 2)  Vitamin C 500 Mg Tabs (Ascorbic acid) .... Take 1 tablet by mouth once a day 3)  Fish Oil 1200 Mg Caps (Omega-3 fatty acids) .... Take 1 tablet by mouth once a day 4)  Amlodipine Besylate 10 Mg Tabs (Amlodipine besylate) .... Take one half  tablet by mouth daily 5)  Isosorbide Dinitrate 20 Mg Tabs (Isosorbide dinitrate) .... Take 1 tablet by mouth three times a day 6)  Triamterene-hctz 37.5-25 Mg Tabs (Triamterene-hctz) .... Take 1/2  tablet by mouth every morning 7)  Aspirin 81 Mg Tabs (Aspirin) .... Take 1 tablet by mouth four times a day 8)  Sertraline Hcl 100 Mg Tabs (Sertraline hcl) .... One by mouth once daily 9)  Metoprolol Tartrate 100 Mg Tabs (Metoprolol tartrate) .Marland Kitchen.. 1 tab by mouth two times a day 10)   Ranitidine Hcl 150 Mg Tabs (Ranitidine hcl) .... One by mouth bid 11)  Pa Melatonin 5 Mg Tabs (Melatonin) .... Take 1 tab by mouth at bedtime 12)  Simvastatin 20 Mg Tabs (Simvastatin) .... One by mouth q pm 13)  Losartan Potassium 25 Mg Tabs (Losartan potassium) .... One by mouth once daily  Other Orders: Flu Vaccine 27yrs + MEDICARE PATIENTS (J4782) Administration Flu vaccine - MCR (N5621) Pneumococcal Vaccine (30865) Admin 1st Vaccine (78469)  Patient Instructions: 1)  Please schedule a follow-up appointment in 2 months. 2)  Keep BP log - take blood pressure once daily Prescriptions: SIMVASTATIN 20 MG TABS (SIMVASTATIN) one by mouth q pm  #90 x 1   Entered and Authorized by:   D. Thomos Lemons DO   Signed by:   D. Thomos Lemons DO on 02/25/2010   Method used:   Faxed to .Marland KitchenMarland Kitchen  [facility] (mail-order)       [address]       [place], [place]  [id number]       Ph: [phone]       Fax: [phone]   RxID:   [id number] LOSARTAN POTASSIUM 25 MG TABS (LOSARTAN POTASSIUM) one by mouth once daily  #30 x 3   Entered and Authorized by:   D. Thomos Lemons DO   Signed by:   D. Thomos Lemons DO on 02/25/2010   Method used:   Print then Give to Patient   RxID:   [id number] SIMVASTATIN 20 MG TABS (SIMVASTATIN) one by mouth q pm  #90 x 1   Entered and Authorized by:   D. Thomos Lemons DO   Signed by:   D. Thomos Lemons DO on 02/25/2010   Method used:   Print then Give to Patient   RxID:   [id number]    Orders Added: 1)  Flu Vaccine 95yrs + MEDICARE PATIENTS [Q2039] 2)  Administration Flu vaccine - MCR [G0008] 3)  Pneumococcal Vaccine [90732] 4)  Admin 1st Vaccine [90471] 5)  Est. Patient Level III [606[address]]   Immunizations Administered:  Pneumonia Vaccine:    Vaccine Type: Pneumovax (Medicare)    Site: right deltoid    Mfr: Merck    Dose: 0.5 ml    Route: IM    Given by: Mervin Kung CMA (AAMA)    Exp. Date: 07/21/2011    Lot #: [id number]    VIS given:  04/12/09 version given February 28, 2010.  Vaccine was actually given on 02/25/10. Nicki Guadalajara Fergerson CMA Duncan Dull)  February 28, 2010 8:54 AM   Immunizations Administered:  Pneumonia Vaccine:    Vaccine Type: Pneumovax (Medicare)    Site: right deltoid    Mfr: Merck    Dose: 0.5 ml    Route: IM    Given by: Mervin Kung CMA (AAMA)    Exp. Date: 07/21/2011    Lot #: [id number]    VIS given: 04/12/09 version given February 28, 2010.    Current Allergies (reviewed today): ! * INTOLERANCE ACE INHIBITORS   Flu Vaccine Consent Questions     Do you have a history of severe allergic reactions to this vaccine? no    Any prior history of allergic reactions to egg and/or gelatin? no    Do you have a sensitivity to the preservative Thimersol? no    Do you have a past history of Guillan-Barre Syndrome? no    Do you currently have an acute febrile illness? no    Have you ever had a severe reaction to latex? no    Vaccine information given and explained to patient? yes    Are you currently pregnant? no    Lot Number:[id number]   Exp Date:11/05/2010   Site Given  Left Deltoid IM.  Nicki Guadalajara Fergerson CMA Duncan Dull)  February 25, 2010 10:57 AM

## 2010-06-09 NOTE — Progress Notes (Signed)
Summary: rantadine rx [facility]   Phone Note Call from Patient   Caller: patient live Call For: yoo  Summary of Call: he needs a writtne rx for rantidine for 90 day supply .  please send to [facility]  fax [phone] Initial call taken by: Roselle Locus,  June 18, 2009 4:14 PM    New/Updated Medications: RANITIDINE HCL 150 MG TABS (RANITIDINE HCL) one by mouth bid Prescriptions: RANITIDINE HCL 150 MG TABS (RANITIDINE HCL) one by mouth bid  #180 x 3   Entered and Authorized by:   D. Thomos Lemons DO   Signed by:   D. Thomos Lemons DO on 06/18/2009   Method used:   Faxed to ...       [facility] (mail-order)       [address]       [place], [place]  [id number]       Ph: [phone]       Fax: [phone]   RxID:   [id number]

## 2010-06-09 NOTE — Assessment & Plan Note (Signed)
Summary: 1 month follow up/mhf   Vital Signs:  Patient profile:   70 year old male Weight:      220.50 pounds BMI:     30.86 O2 Sat:      96 % on Room air Temp:     98.3 degrees F oral Pulse rate:   77 / minute Pulse rhythm:   regular Resp:     18 per minute BP sitting:   110 / 72  (right arm) Cuff size:   regular  Vitals Entered By: Glendell Docker CMA (June 29, 2009 8:57 AM)  O2 Flow:  Room air CC:  Rm 2 -1 month follow up Is Patient Diabetic? No Comments 1 month follow up had repair on anuerysm on 06/03/2009 , follow yesterday and was cleared   Primary Care Provider:  D. Thomos Lemons DO  CC:   Rm 2 -1 month follow up.  History of Present Illness: 69 y/o white male for f/u on prev visit, large 9.1cm  AAA discovered. he was urgently seen by vascular surgeon.   He underwent stend placement.   he did well,  no complications from procedure  Preventive Screening-Counseling & Management  Alcohol-Tobacco     Smoking Status: quit  Allergies: 1)  ! * Intolerance Ace Inhibitors  Past History:  Past Medical History: Current Problems:  HYPERTENSION (ICD-401.9) HYPERLIPIDEMIA (ICD-272.4)  CORONARY ARTERY DISEASE (ICD-414.00) FAMILY HISTORY OF CAD MALE 1ST DEGREE RELATIVE <60 (ICD-V16.49) NEPHROLITHIASIS, HX OF (ICD-V13.01) GERD (ICD-530.81) DEPRESSION (ICD-311)  SKIN CANCER, HX OF (ICD-V10.83) H/O pnemothorax AAA 9.1 cm - S/P repair 05/2009  Past Surgical History: cardiac Cath 06/2004  PROCEDURE:  Percutaneous coronary intervention, drug-eluting stent implantationproximal left anterior descending. Percutaneous coronary intervention, balloon dilatation only, first diagonal branch.Percutaneous closure right femoral artery.ANGIOGRAPHY:  The lesion was treated with a complex LAD diagonal stenosis.  The diagonal had an 80% stenosis at its origin and then was patent for about 1 cm and then there was a 70-80% stenosis that extended about 1 cm.  The vessel was moderate  in size.  The anterior descending artery itself had a90% diffuse stenosis.  Following balloon dilatation and stent implantation, there was no residual stenosis in the anterior descending artery and there was an 80% stenosis at the origin of the diagonal branch.  There was some dissection, nonpropagating and non-occlusive, seen about 10-15 mm more distally in the diagonal.  TIMI-3 flow was present throughout.HE/MEDQ  D:  07/05/2004  T:  07/06/2004  Job:  [id number] cc:   Tasia Catchings, M.D. [address] [place] [address] Fax: [phone] Colonoscopy early 3's - negative for polyp  Tonsillectomy Lithotripsy Arthroscopic knee surgery 9.1-cm infrarenal abdominal aortic aneurysm status post stent graft repair.  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   Neck:  supple, no masses, and no carotid bruits.   Lungs:  normal respiratory effort and normal breath sounds.   Heart:  normal rate,  regular rhythm, and no gallop.   Pulses:  dorsalis pedis and posterior tibial pulses are full and equal bilaterally Extremities:  trace left pedal edema and trace right pedal edema.     Impression & Recommendations:  Problem # 1:  ABDOMINAL AORTIC ANEURYSM (ICD-441.4) 9.1-cm infrarenal abdominal aortic aneurysm status post stent graft repair.  He did very well.   He has resumed his normal activity.  He has f/u vascular specialist in 3 months Dr. Myra Gianotti.     Carotid doppler screening performed- reported negative for significant carotid artery stenosis.  Problem # 2:  HYPERTENSION (ICD-401.9) well controlled.  repeat BMET.  Cr improving.  potassium was low during hospitalization.  His updated medication list for this problem includes:    Amlodipine Besylate 10 Mg Tabs (Amlodipine besylate) .Marland Kitchen... Take one tablet by mouth daily    Triamterene-hctz 37.5-25 Mg Tabs (Triamterene-hctz) .Marland Kitchen... Take 1 tablet by mouth every morning    Metoprolol Tartrate 100 Mg Tabs (Metoprolol tartrate) .Marland Kitchen... 1 tab by mouth two times a day  BP today: 110/72 Prior BP: 136/90 (05/28/2009)  Labs Reviewed: K+: 3.7 (05/28/2009) Creat: : 1.45 (05/28/2009)   Chol: 160 (05/14/2009)   HDL: 44 (05/14/2009)   LDL: 98 (05/14/2009)   TG: 89 (05/14/2009)  Problem # 3:  HYPERLIPIDEMIA (ICD-272.4) Goal LDL < 70.  Increase simvastatin to 40 mg.  Labs before next OV.  His updated medication list for this problem includes:    Simvastatin 40 Mg Tabs (Simvastatin) ..... One by mouth qpm  Labs Reviewed: SGOT: 33 (05/14/2009)   SGPT: 19 (05/14/2009)   HDL:44 (05/14/2009)  LDL:98 (05/14/2009)  Chol:160 (05/14/2009)  Trig:89 (05/14/2009)  Complete Medication List: 1)  Multivitamins Tabs (Multiple vitamin) .... Take 1 tablet by mouth once a day 2)  Vitamin C 500 Mg Tabs (Ascorbic acid) .... Take 1 tablet by mouth once a day 3)  Fish Oil 1200 Mg Caps (Omega-3 fatty acids) .... Take 1 tablet by mouth once a day 4)  Amlodipine Besylate 10 Mg Tabs (Amlodipine besylate) .... Take one tablet by mouth daily 5)  Isosorbide Dinitrate 20 Mg Tabs (Isosorbide dinitrate) .... Take 1 tablet by mouth three times a day 6)  Simvastatin 40 Mg Tabs (Simvastatin) .... One by mouth qpm 7)  Triamterene-hctz 37.5-25 Mg Tabs (Triamterene-hctz) .... Take 1 tablet by mouth every morning 8)  Aspirin 81 Mg Tabs (Aspirin) .... Take 1 tablet by mouth four times a day 9)  Sertraline Hcl 100 Mg Tabs (Sertraline  hcl) .... One by mouth once daily 10)  Metoprolol Tartrate 100 Mg Tabs (Metoprolol tartrate) .Marland Kitchen.. 1 tab by mouth two times a day 11)  Polyethylene Glycol 8000 Powd (Polyethylene glycol 8000) .... Once daily 12)  Ranitidine Hcl 150 Mg Tabs (Ranitidine hcl) .... One by mouth bid  Other Orders: T-Basic Metabolic Panel [id number])  Patient Instructions: 1)  Please schedule a follow-up appointment in 6 months. 2)  AST, ALT, FLP:  272.4 3)  BMET:  401.9 4)  PSA:  V76.44 5)  Please return for lab work one (1) week before your next appointment.  Prescriptions: SIMVASTATIN 40 MG TABS (SIMVASTATIN) one by mouth qpm  #90 x 3   Entered and Authorized by:   D. Thomos Lemons DO   Signed by:   D. Thomos Lemons DO on 06/29/2009   Method used:   Faxed to ...       [facility] (mail-order)       [address]  [place], [place]  [id number]       Ph: [phone]       Fax: [phone]   RxID:   [id number]   Current Allergies (reviewed today): ! * INTOLERANCE ACE INHIBITORS

## 2010-06-09 NOTE — Progress Notes (Signed)
Summary: Losartan Potassium   Phone Note Refill Request Message from:  Patient on May 27, 2010 1:19 PM  Refills Requested: Medication #1:  LOSARTAN POTASSIUM 25 MG TABS one by mouth once daily   Dosage confirmed as above?Dosage Confirmed   Brand Name Necessary? No   Supply Requested: 3 months he needs new rx [facility] as he has no refills left    Method Requested: Electronic Next Appointment Scheduled: 10-17-10 Dr Artist Pais  Initial call taken by: Roselle Locus,  May 27, 2010 1:20 PM    Prescriptions: LOSARTAN POTASSIUM 25 MG TABS (LOSARTAN POTASSIUM) one by mouth once daily  #90 x 1   Entered by:   Glendell Docker CMA   Authorized by:   D. Thomos Lemons DO   Signed by:   Glendell Docker CMA on 05/27/2010   Method used:   Faxed to ...       [facility] (mail-order)       [address]       [place], [place]  [id number]       Ph: [phone]       Fax: [phone]   RxID:   [id number]

## 2010-06-09 NOTE — Progress Notes (Signed)
  Phone Note From Other Clinic   Caller: Karen/[facility] Pre-Admit Details for Reason: Pt.Information Initial call taken by: Mikel Cella lead faxed to [phone] [facility]  June 02, 2009 11:27 AM

## 2010-06-09 NOTE — Consult Note (Signed)
Summary: [facility]  [facility]   Imported By: Lanelle Bal 06/07/2009 13:47:42  _____________________________________________________________________  External Attachment:    Type:   Image     Comment:   External Document

## 2010-06-09 NOTE — Progress Notes (Signed)
Summary: surgery update  Phone Note Call from Patient   Summary of Call: Pt. wants Dr.Makinna Andy to know that they think his surgery went well. His Creatnine is down and that is good because he said Dr.Elaisha Zahniser was a little concerned about that. Patient just wanted to mainly say thank you to Dr.Nayvie Lips and staff. Initial call taken by: Michaelle Copas,  June 04, 2009 12:40 PM  Follow-up for Phone Call        noted  Follow-up by: D. Thomos Lemons DO,  June 04, 2009 2:27 PM

## 2010-06-09 NOTE — Assessment & Plan Note (Signed)
Summary: hiccups x 3 days/mhf   Vital Signs:  Patient profile:   70 year old male Weight:      218.50 pounds BMI:     30.58 O2 Sat:      96 % on Room air Temp:     98.5 degrees F oral Pulse rate:   106 / minute Pulse rhythm:   regular Resp:     18 per minute BP sitting:   142 / 100  (right arm) Cuff size:   large  Vitals Entered By: Glendell Docker CMA (May 13, 2009 1:21 PM)  O2 Flow:  Room air   Primary Care Provider:  D. Thomos Lemons DO  CC:  Follow up .  History of Present Illness: Follow up   70 y/o with  hx of CAD and tobacco abuse c/o intractable hiccups since monday.  Previous he had on and off cough x 6 months.  cough stopped after hiccups started.  htn - he reports good med compliance  depression - stable.  he would like generic for lexapro  Preventive Screening-Counseling & Management  Alcohol-Tobacco     Smoking Status: quit  Allergies: 1)  ! * Intolerance Ace Inhibitors  Past History:  Past Medical History: Current Problems:  HYPERTENSION (ICD-401.9) HYPERLIPIDEMIA (ICD-272.4)  CORONARY ARTERY DISEASE (ICD-414.00) FAMILY HISTORY OF CAD MALE 1ST DEGREE RELATIVE <60 (ICD-V16.49) NEPHROLITHIASIS, HX OF (ICD-V13.01) GERD (ICD-530.81) DEPRESSION (ICD-311) SKIN CANCER, HX OF (ICD-V10.83) H/O pnemothorax  Family History: Family History of CAD Male 1st degree relative <60 Family History High cholesterol Family History Hypertension Mother died of PE Family History of Prostate CA 1st degree relative Colon CA - no   Social History: Retired Divorced 2 sons 31 &30 Occupation:  Part time sports Corporate investment banker Smoking Status:  quit Packs/Day:  1.0 Caffeine use/day:  3 beverages daily  Does Patient Exercise:  no Alcohol Use - yes  Physical Exam  General:  alert, well-developed, and well-nourished.   Eyes:  vision grossly intact, pupils equal, pupils round, and pupils reactive to light.   Lungs:  normal respiratory effort, normal breath  sounds, no crackles, and no wheezes.   Heart:  normal rate, regular rhythm, no murmur, and no gallop.  (heart sounds distant) Extremities:  trace left pedal edema and trace right pedal edema.   Psych:  normally interactive, good eye contact, and slightly anxious.     Impression & Recommendations:  Problem # 1:  HICCUPS (ICD-786.8) 70 y/o with intractable hiccups.  He has hx of tob abuse.  check CXR.   Orders: T-2 View CXR, Same Day (71020.5TC)  Problem # 2:  COUGH (ICD-786.2) 70 y/o with prev smoking hx with intermittent cough.  check cxr Orders: T-2 View CXR, Same Day (71020.5TC) Radiology Referral (Radiology)  Problem # 3:  HYPERTENSION (ICD-401.9) suboptimal  change verapemil to amlodipine  His updated medication list for this problem includes:    Amlodipine Besylate 10 Mg Tabs (Amlodipine besylate) .Marland Kitchen... Take one tablet by mouth daily    Triamterene-hctz 37.5-25 Mg Tabs (Triamterene-hctz) .Marland Kitchen... Take 1 tablet by mouth every morning (hold)    Metoprolol Tartrate 100 Mg Tabs (Metoprolol tartrate) .Marland Kitchen... 1 tab by mouth two times a day  Orders: T-Basic Metabolic Panel [phone])  BP today: 142/100 Prior BP: 150/100 (11/18/2008)  Problem # 4:  DEPRESSION (ICD-311) change to sertraline from lexapro due to cost reasons.  pt advised to report any significant change in mood His updated medication list for this problem includes:    Sertraline  Hcl 100 Mg Tabs (Sertraline hcl) ..... One by mouth once daily  Complete Medication List: 1)  Multivitamins Tabs (Multiple vitamin) .... Take 1 tablet by mouth once a day 2)  Vitamin C 500 Mg Tabs (Ascorbic acid) .... Take 1 tablet by mouth once a day 3)  Fish Oil 1200 Mg Caps (Omega-3 fatty acids) .... Take 1 tablet by mouth once a day 4)  Amlodipine Besylate 10 Mg Tabs (Amlodipine besylate) .... Take one tablet by mouth daily 5)  Isosorbide Dinitrate 20 Mg Tabs (Isosorbide dinitrate) .... Take 1 tablet by mouth three times a day 6)   Simvastatin 20 Mg Tabs (Simvastatin) .... Take 1 tablet by mouth once a day 7)  Triamterene-hctz 37.5-25 Mg Tabs (Triamterene-hctz) .... Take 1 tablet by mouth every morning (hold) 8)  Aspirin 81 Mg Tabs (Aspirin) .... Take 1 tablet by mouth four times a day 9)  Sertraline Hcl 100 Mg Tabs (Sertraline hcl) .... One by mouth once daily 10)  Metoprolol Tartrate 100 Mg Tabs (Metoprolol tartrate) .Marland Kitchen.. 1 tab by mouth two times a day 11)  Prilosec 20 Mg Cpdr (Omeprazole) .... Take 1 capsule by mouth once a day 12)  Polyethylene Glycol 8000 Powd (Polyethylene glycol 8000) .... Once daily 13)  Plavix 75 Mg Tabs (Clopidogrel bisulfate) .... Take 1 tablet by mouth once a day 14)  Chlorpromazine Hcl 25 Mg Tabs (Chlorpromazine hcl) .... One by mouth three times a day  Patient Instructions: 1)  Please schedule a follow-up appointment in 1 month. 2)  BMP prior to visit, ICD-9: 401.9 3)  Hepatic Panel prior to visit, ICD-9: 272.4 4)  Lipid Panel prior to visit, ICD-9: 272.4 5)  TSH prior to visit, ICD-9: 272.4 6)  CBC w/ Diff prior to visit, ICD-9: 401.9 7)  Please return for lab work within 1 - 2 days Prescriptions: PLAVIX 75 MG TABS (CLOPIDOGREL BISULFATE) Take 1 tablet by mouth once a day  #90 x 3   Entered and Authorized by:   D. Thomos Lemons DO   Signed by:   D. Thomos Lemons DO on 05/13/2009   Method used:   Print then Give to Patient   RxID:   [id number] METOPROLOL TARTRATE 100 MG TABS (METOPROLOL TARTRATE) 1 tab by mouth two times a day  #180 x 3   Entered and Authorized by:   D. Thomos Lemons DO   Signed by:   D. Thomos Lemons DO on 05/13/2009   Method used:   Print then Give to Patient   RxID:   [id number] TRIAMTERENE-HCTZ 37.5-25 MG TABS (TRIAMTERENE-HCTZ) Take 1 tablet by mouth every morning  #90 x 3   Entered and Authorized by:   D. Thomos Lemons DO   Signed by:   D. Thomos Lemons DO on 05/13/2009   Method used:   Print then Give to Patient   RxID:   [id number] SIMVASTATIN 20 MG TABS  (SIMVASTATIN) Take 1 tablet by mouth once a day  #90 x 3   Entered and Authorized by:   D. Thomos Lemons DO   Signed by:   D. Thomos Lemons DO on 05/13/2009   Method used:   Print then Give to Patient   RxID:   [id number] ISOSORBIDE DINITRATE 20 MG TABS (ISOSORBIDE DINITRATE) Take 1 tablet by mouth three times a day  #270 x 3   Entered and Authorized by:   D. Thomos Lemons DO   Signed by:   D. Thomos Lemons DO on 05/13/2009   Method  used:   Print then Give to Patient   RxID:   [id number] AMLODIPINE BESYLATE 10 MG TABS (AMLODIPINE BESYLATE) Take one tablet by mouth daily  #90 x 3   Entered and Authorized by:   D. Thomos Lemons DO   Signed by:   D. Thomos Lemons DO on 05/13/2009   Method used:   Print then Give to Patient   RxID:   [id number] SERTRALINE HCL 100 MG TABS (SERTRALINE HCL) one by mouth once daily  #90 x 3   Entered and Authorized by:   D. Thomos Lemons DO   Signed by:   D. Thomos Lemons DO on 05/13/2009   Method used:   Print then Give to Patient   RxID:   [id number] METOPROLOL TARTRATE 100 MG TABS (METOPROLOL TARTRATE) 1 tab by mouth two times a day  #180 x 3   Entered and Authorized by:   D. Thomos Lemons DO   Signed by:   D. Thomos Lemons DO on 05/13/2009   Method used:   Electronically to        [facility]* (retail)       [address] [place], [place]  [address]       Ph: [phone]       Fax: [phone]   RxID:   [id number] CHLORPROMAZINE HCL 25 MG TABS (CHLORPROMAZINE HCL) one by mouth three times a day  #15 x 0   Entered and Authorized by:   D. Thomos Lemons DO   Signed by:   D. Thomos Lemons DO on 05/13/2009   Method used:   Electronically to        [facility]* (retail)       [address] [place], [place]  [address]       Ph: [phone]       Fax: [phone]   RxID:   [id number]   Current Allergies (reviewed today): ! * INTOLERANCE ACE INHIBITORS

## 2010-06-09 NOTE — Letter (Signed)
Summary: [facility]   [facility]   Imported By: Roderic Ovens 07/15/2009 14:34:03  _____________________________________________________________________  External Attachment:    Type:   Image     Comment:   External Document

## 2010-06-09 NOTE — Letter (Signed)
   [facility] [address] [place], [place]  [address]  [place] Phone: [phone]      February 25, 2010   [facility] [address] [place], [place] [address]  RE:  LAB RESULTS  Dear  Mr. DAVITT,  The following is an interpretation of your most recent lab tests.  Please take note of any instructions provided or changes to medications that have resulted from your lab work.  LIPID PANEL:  Fair - review at your next visit Triglyceride: 106   Cholesterol: 136   LDL: 78   HDL: 37   Chol/HDL%:  3.7 Ratio    I have attached a low cholesterol diet handout.     Sincerely Yours,    Dr. Thomos Lemons  Appended Document:  mailed

## 2010-06-09 NOTE — Progress Notes (Signed)
Summary: ok to do ct without contrast per Dr Artist Pais   Phone Note From Other Clinic   Caller: [facility] Call For: Artist Pais  Summary of Call: Gerarda Gunther called from imaging.  The patient's labs are too high to inject him and she wanted verbal permisson to do CT without contrast.  Per Dr Artist Pais ok  Initial call taken by: Roselle Locus,  May 14, 2009 10:42 AM

## 2010-07-24 LAB — PROTIME-INR
INR: 1.03 (ref 0.00–1.49)
Prothrombin Time: 13.4 seconds (ref 11.6–15.2)

## 2010-07-24 LAB — URINALYSIS, ROUTINE W REFLEX MICROSCOPIC
Bilirubin Urine: NEGATIVE
Hgb urine dipstick: NEGATIVE
Specific Gravity, Urine: 1.018 (ref 1.005–1.030)
pH: 7 (ref 5.0–8.0)

## 2010-07-24 LAB — COMPREHENSIVE METABOLIC PANEL
Alkaline Phosphatase: 61 U/L (ref 39–117)
CO2: 22 mEq/L (ref 19–32)
Calcium: 8.9 mg/dL (ref 8.4–10.5)
Chloride: 104 mEq/L (ref 96–112)
GFR calc Af Amer: >60 mL/min (ref >60)
GFR calc non Af Amer: 51 mL/min — ABNORMAL LOW (ref >60)
Total Bilirubin: 0.6 mg/dL (ref 0.3–1.2)

## 2010-07-24 LAB — TYPE AND SCREEN: Antibody Screen: NEGATIVE

## 2010-07-24 LAB — BASIC METABOLIC PANEL
BUN: 12 mg/dL (ref 6–23)
Calcium: 8.1 mg/dL — ABNORMAL LOW (ref 8.4–10.5)
GFR calc non Af Amer: >60 mL/min (ref >60)
Potassium: 2.9 mEq/L — ABNORMAL LOW (ref 3.5–5.1)

## 2010-07-24 LAB — BLOOD GAS, ARTERIAL
FIO2: 0.21 %
Patient temperature: 98.6
pO2, Arterial: 72.9 mmHg — ABNORMAL LOW (ref 80.0–100.0)

## 2010-07-24 LAB — CBC
HCT: 32 % — ABNORMAL LOW (ref 39.0–52.0)
HCT: 37 % — ABNORMAL LOW (ref 39.0–52.0)
MCHC: 34.4 g/dL (ref 30.0–36.0)
MCV: 88.9 fL (ref 78.0–100.0)
Platelets: 143 10*3/uL — ABNORMAL LOW (ref 150–400)
RBC: 4.17 MIL/uL — ABNORMAL LOW (ref 4.22–5.81)
RDW: 14.5 % (ref 11.5–15.5)
WBC: 7.6 10*3/uL (ref 4.0–10.5)

## 2010-07-24 LAB — APTT: aPTT: 31 seconds (ref 24–37)

## 2010-07-24 LAB — ABO/RH: ABO/RH(D): A POS

## 2010-09-20 NOTE — Assessment & Plan Note (Signed)
OFFICE VISIT   Mark [name], Mark [name]  DOB:  June 07, 1940                                       08/05/2009  UXNAT#:[id number]   REASON FOR VISIT:  Right groin swelling, status post EVAR.   Patient is [name] 70 year old Caucasian male who is status post endovascular  repair of his abdominal aortic aneurysm on 06/03/2009 by Dr. Venida Jarvis.  The 9.1 cm aneurysm that was repaired with [name] Gore excluder  stent graft.  He was last seen for his 54-month follow-up on February 21.  The CT showed the graft in good position without evidence of endoleak.  Apparently he had mentioned that he had some bilateral groin swelling at  that time.  There is no specific mention of this in Dr. Theora Gianotti note but  appears that he felt these were most likely lymphoceles.  He was going  to monitor them.  Since that visit, his left groin swelling has nearly  resolved.  However, his right groin continues to be swollen.  There has  been no significant change from that visit, however.  There has been no  redness, and he has been afebrile.  It is not causing him [name] significant  amount of discomfort, although it is sore with palpation.   He underwent [name] right groin duplex at our office today which showed [name]  nonvascular cystic structure located at the right groin.  There was no  evidence of pseudoaneurysm or arteriovenous fistula.  The vascular  technician reported that there was no compression of his femoral vein by  the pseudoaneurysm.  He also underwent [name] right leg venous Doppler to  rule out DVT, as his right leg was more swollen than his left.  The  patient reports that it had been this way for about 1 month.  He has had  no pleuritic chest pain or shortness of breath or leg pain.  The right  leg venous duplex was negative for deep vein thrombosis.   Physical exam findings show [name] blood pressure of 141/89, heart rate of  70, oxygen saturation 96% on room air.  This is [name] well-nourished, well-  developed male who appears his stated age in no acute distress.  Lung  sounds were clear.  Heart has [name] regular rate and rhythm.  Abdomen is  soft.  Bilateral groins show evidence of well-healed incisions.  Left  groin is relatively flat.  The right groin shows [name] soft palpable mass  which measures approximately 8.5 x 4.5 cm.  There is no erythema or  drainage.  Lower extremities show mild generalized edema, more  pronounced on the right than on the left.  Next   Dr. 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  After discussing options of  aspiration of the lymphocele versus continue monitoring of the  lymphocele, the patient opted to proceed with drainage of the  lymphocele.  He was instructed that there was [name] possibility that it  could reaccumulate.  Dr. Edilia Bo aspirated 35 mL of clear yellow fluid  consistent with lymphatic fluid.  This was not sent for culture.  [name]  dressing was placed over the aspiration site.  Patient was told to call  our office if there is redness at the aspiration site.  If the  lymphocele  reaccumulates, he can continue to watch this unless the size  seems significantly worse, then he can let our office know, otherwise we  will plan on having him seen back in approximately 2 months for [name]  scheduled appointment with Dr. Myra Gianotti.   Jerold Coombe, PA   Di Kindle. Edilia Bo, M.D.  Electronically Signed   AWZ/MEDQ  D:  08/05/2009  T:  08/06/2009  Job:  [id number]

## 2010-09-20 NOTE — Procedures (Signed)
CAROTID DUPLEX EXAM   INDICATION:  Carotid bruit.   HISTORY:  Diabetes:  No.  Cardiac:  No.  Hypertension:  Yes.  Smoking:  Previous.  Previous Surgery:  AAA repair.  CV History:  Asymptomatic.  Amaurosis Fugax No, Paresthesias No, Hemiparesis No.                                       RIGHT             LEFT  Brachial systolic pressure:         131               132  Brachial Doppler waveforms:         Triphasic         Triphasic  Vertebral direction of flow:        Antegrade         Antegrade  DUPLEX VELOCITIES (cm/sec)  CCA peak systolic                   72                67  ECA peak systolic                   92                71  ICA peak systolic                   47                67  ICA end diastolic                   19                29  PLAQUE MORPHOLOGY:                  Calcified         Mixed  PLAQUE AMOUNT:                      Mild              Mild  PLAQUE LOCATION:                    Bifurcation       Bifurcation   IMPRESSION:  1. 1-19% stenosis noted in bilateral bifurcations.  2. Mild plaque noted,  bilateral bifurcations.        ___________________________________________  V. Charlena Cross, MD   CJ/MEDQ  D:  06/28/2009  T:  06/29/2009  Job:  [id number]

## 2010-09-20 NOTE — Assessment & Plan Note (Signed)
OFFICE VISIT   Mark [name], Mark [name]  DOB:  1940-10-17                                       06/28/2009  EAVWU#:[id number]   Patient returns today, having undergone endovascular repair of his  abdominal aortic aneurysm on 06/03/2009.  He had [name] 9.1 cm aneurysm that  was repaired with [name] Gore excluder device.  His postoperative course was  uncomplicated.  He comes back in today.  He has been doing well at home  without complaints.   The patient had [name] carotid ultrasound today which shows 1-19% stenosis  bilaterally.  All ABIs were calculated today.  Both legs were greater  than 1 with triphasic wave forms.   The patient had [name] CT scan today, which shows no evidence of endoleak,  and the graft is in good position.   With the patient's renal insufficiency, I will plan on obtaining as many  ultrasounds as possible to follow his aneurysm.  I will see him back in  3 months with an ultrasound.  I will also get an ultrasound in 6 months  and then will consider whether or not there is [name] need for repeat CT  scan.     Jorge Ny, MD  Electronically Signed   VWB/MEDQ  D:  06/28/2009  T:  06/29/2009  Job:  [id number]   cc:   Barbette Hair. Artist Pais, DO  Madolyn Frieze. Jens Som, MD, [facility]

## 2010-09-20 NOTE — Assessment & Plan Note (Signed)
OFFICE VISIT   Mark, MCCAREY [name]  DOB:  Nov 05, 1940                                       08/30/2009  NFAOZ#:[id number]   Mr. Mark [name] comes in today for worsening swelling in the right leg.  He  underwent endovascular aneurysm repair for [name] 9.5 Cm aneurysm.  He had  bilateral seromas postoperatively.  He was recently seen in the office  complaining of swelling in his right leg.  He was seen by Dr. Edilia Bo  and Revonda Standard.  [name] duplex was performed which was negative for DVT.  The  seroma was aspirated.  [name] clear fluid was returned.  The patient stated  that he had early recurrence of [name] seroma and that his swelling is  slightly worse.  I repeated his ultrasound today.  This again is  negative for DVT.  The right groin shows evidence of seroma.  There is  no evidence of infection.  I told the patient that I would not recommend  compression stockings to help with the swelling and this should resolve  over time.  I will plan on seeing him back at his next scheduled visit  to evaluate his stent graft.  He will call me if he has any questions.     Mark Ny, MD  Electronically Signed   VWB/MEDQ  D:  08/30/2009  T:  08/31/2009  Job:  [id number]

## 2010-09-20 NOTE — Assessment & Plan Note (Signed)
OFFICE VISIT   Mark Mark [name], Mark Mark [name]  DOB:  1941-03-30                                       05/31/2009  [id number]#:[id number]   REASON FOR VISIT:  Abdominal aneurysm.   CHIEF COMPLAINT:  None.   HISTORY:  This is Mark [name] 70 year old gentleman that I am seeing at the  request of Dr. Artist [name] for evaluation of abdominal aortic aneurysm.  The  patient was being evaluated for elevated creatinine and underwent an  abdominal ultrasound.  This revealed Mark [name] 9.1-cm abdominal aortic aneurysm.  The patient was sent to me for further evaluation and management.  The  patient has Mark [name] history of COPD and coronary artery disease, having  undergone an LAD stent in 2007.  He has recently been evaluated by Dr.  Jens [name], and per the patient's report, everything looked appropriate.  The patient also has Mark [name] history of hypercholesterolemia (he tells me his  cholesterol is 170), as well as hypertension which is medically managed.   REVIEW OF SYSTEMS:  GENERAL:  Negative for fevers, chills, weight gain,  weight loss.  CARDIAC:  Negative.  PULMONARY:  Positive for productive cough.  GI:  Negative.  GU:  Negative.  VASCULAR:  Aneurysm.  NEURO:  Positive for headaches with migraine symptoms.  MUSCULOSKELETAL:  Negative.  PSYCH:  Positive for depression.  ENT:  Positive for nosebleeds.  HEME:  Negative.  SKIN:  Negative.   PAST MEDICAL HISTORY:  Hypertension, hypercholesterolemia, COPD,  coronary artery disease.   PAST SURGICAL HISTORY:  LAD stent in 2007, right knee arthroscopy and  tonsillectomy.   FAMILY HISTORY:  Positive for cardiovascular in his father who had Mark [name]  popliteal aneurysm.   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  He does not smoke.  Has Mark [name] history of smoking but  quit in 1982.  Drinks alcohol approximately 1-2 drinks per week.   PHYSICAL EXAMINATION:  Heart rate 79, blood pressure is 118/84,  temperature is 98.7.  General:   He is well-appearing, in no distress.  HEENT:  Normal.  Pulmonary:  Lungs are clear bilaterally.  Cardiovascular:  Regular rate and rhythm.  No carotid bruits.  He has  bounding bilateral popliteal, dorsalis pedis and posterior tibial  pulses.  Abdomen:  Soft.  Pulsatile abdominal mass is easily  appreciated.  It is tender to the touch but is not causing him pain at  rest.  Musculoskeletal:  Without major deformities.  Neuro:  No has no  focal weaknesses.  Skin:  Without rash.   ASSESSMENT:  Large abdominal aortic aneurysm.   PLAN:  At this point I need more information to evaluate whether he is  an endovascular versus an open candidate.  Given that he was tender upon  palpation of his aneurysm, I am going to expedite Mark [name] CT scan and get this  done today.  He does have Mark [name] mild elevation in his creatinine; however, I  think the risks of aneurysm rupture far outweigh the risks of renal  insufficiency in this scenario.  The patient is being sent to our  radiology center for Mark [name] CT scan.  I will call him tonight with results,  and we will plan on his repair.  He does have full popliteal pulses.  He  will also need evaluation to rule out  popliteal aneurysm.  I will also  need ultrasound to evaluate his carotid disease; however, I think the  aorta takes precedence.     Mark Ny, MD  Electronically Signed   VWB/MEDQ  D:  05/31/2009  T:  06/01/2009  Job:  [id number]   cc:   Mark Hair. Artist Pais, DO  Mark Frieze. Jens Som, MD, [facility]

## 2010-09-20 NOTE — Procedures (Signed)
VASCULAR LAB EXAM   INDICATION:  Right groin knot.   HISTORY:   EXAM:  Right groin duplex.   IMPRESSION:  There appears to be a nonvascularized cystic structure  located at the right groin.  There does not appear to be any evidence of  a pseudoaneurysm or arteriovenous fistula noted.   ___________________________________________  Di Kindle. Edilia Bo, M.D.   CB/MEDQ  D:  08/05/2009  T:  08/06/2009  Job:  [id number]

## 2010-09-20 NOTE — Assessment & Plan Note (Signed)
OFFICE VISIT   Mark [name], Mark [name] A  DOB:  10-Nov-1940                                       09/27/2009  YNWGN#:[id number]   VISIT REASON:  Followup.   HISTORY:  This is a 70 year old gentleman who is status post  endovascular aneurysm repair for a 9.5 cm aneurysm on June 03, 2009.  A Gore device was deployed.  His postoperative course was complicated by  bilateral seromas.  One was drained by Dr. Edilia Bo but it recurred.  He  has also had swelling in his right leg.  He has had multiple venous  Dopplers which have been negative for DVT.  I placed him in compression  stockings the last time I saw him.  He is back today for follow-up.  He  denies having any abdominal pain at this time.  He feels his symptoms  are improving with compression.  It is more comfortable to walk and he  feels his groin lymphoceles are resolving.   REVIEW OF SYSTEMS:  Negative except what is documented in the HPI.   PHYSICAL EXAMINATION:  Heart is 51, blood pressure 160/90, temperature  is 97.7.  General:  He is well-appearing in no distress.  HEENT:  Within  normal limits.  Respirations:  Nonlabored.  Cardiovascular:  Regular  rate and rhythm.  Abdomen:  Soft.  Musculoskeletal:  Right leg edema.  No ulceration.  Neurology:  No focal deficits.  Skin:  Without rash.   DIAGNOSTIC STUDIES:  I have independently reviewed his ultrasound.  His  aneurysm has decreased in size.  It is now measuring 7.2 x 98.5.  ABIs  are  1.0 bilaterally.   ASSESSMENT/PLAN:  1. Status post endovascular aneurysm repair.  The patient's initial      postop CT scan showed no evidence of endoleak.  He had an      ultrasound today which shows that his aneurysm is decreasing in      size.  I will plan on repeating a CT angiogram in 3 months with      follow-up.  2. Right leg swelling.  I am optimistic that since his groin seromas      are decreasing in size, in fact, they are nearly resolved on      physical  exam,  I am hopeful that his right leg swelling will go      down over time.  If, however, he comes back and he still has      prominent right leg swelling, I will plan on getting a  venous      reflux study at that time.  He has been encouraged to continue      wearing his compression stockings.  I will see the patient back in      3 months.     Jorge Ny, MD  Electronically Signed   VWB/MEDQ  D:  09/27/2009  T:  09/28/2009  Job:  [id number]   cc:   Madolyn Frieze. Jens Som, MD, [facility]  Barbette Hair. Artist Pais, DO

## 2010-09-20 NOTE — Procedures (Signed)
ENDOVASCULAR STENT GRAFT EXAM   INDICATION:  Followup abdominal aortic aneurysm stent graft repair.   HISTORY:  Abdominal aortic aneurysm repair done on June 03, 2009                           DUPLEX EVALUATION   AAA Sac Size:                 7.21 CM AP            8.51 CM TRV  Previous Sac Size:            CM AP                 CM TRV  Evidence of an endoleak?      No                    No   Velocity Criteria:  Proximal Aorta                cm/sec  Proximal Stent Graft          scant cm/sec  Main Body Stent Graft-Mid     35 cm/sec  Right Limb-Proximal           46 cm/sec  Right Limb-Distal             70 cm/sec  Left Limb-Proximal            117 cm/sec  Left Limb-Distal              54 cm/sec  Patent Renal Arteries?        Yes   IMPRESSION:  1. Patent stent graft.  2. ABIs are normal with triphasic wave forms.  3. No evidence of endo leak.  4. Unable to visualize the proximal aorta and the proximal stent graft      due to excess bowel gas and patient body habitus.         ___________________________________________  V. Charlena Cross, MD   NT/MEDQ  D:  09/28/2009  T:  09/28/2009  Job:  [id number]

## 2010-09-20 NOTE — Procedures (Signed)
DUPLEX DEEP VENOUS EXAM - LOWER EXTREMITY   INDICATION:  Right lower extremity edema.   HISTORY:  Edema:  Right leg.  Trauma/Surgery:  Endograft 06/04/09.  Pain:  No.  PE:  No.  Previous DVT:  No.  Anticoagulants:  No.  Other:  No.   DUPLEX EXAM:                CFV   SFV   PopV  PTV    GSV                R  L  R  L  R  L  R   L  R  L  Thrombosis    o  o  o     o     o      o  Spontaneous   +  +  +     +     +      +  Phasic        +  +  +     +     +      +  Augmentation  +  +  +     +     +      +  Compressible  +  +  +     +     +      +  Competent     +  +  +     +     +      +   Legend:  + - yes  o - no  p - partial  D - decreased   IMPRESSION:  There does not appear to be any evidence of deep venous  thrombosis noted in the right lower extremity.    _____________________________  Di Kindle. Edilia Bo, M.D.   CB/MEDQ  D:  08/05/2009  T:  08/05/2009  Job:  [id number]

## 2010-09-23 NOTE — Cardiovascular Report (Signed)
NAMEJARMAN, LITTON NO.:  [id number]   MEDICAL RECORD NO.:  [id number]          PATIENT TYPE:  OIB   LOCATION:  6524                         FACILITY:  [facility]   PHYSICIAN:  Meade Maw, M.D.    DATE OF BIRTH:  [birth year]   DATE OF PROCEDURE:  DATE OF DISCHARGE:                              CARDIAC CATHETERIZATION   REFERRING PHYSICIAN:  Tasia Catchings, M.D.   INDICATIONS FOR PROCEDURE:  Ongoing atypical chest pain, recent stress  Cardiolite revealing inferior apical defect but no reversal.   DESCRIPTION OF PROCEDURE:  After obtaining written informed consent, the  patient was brought to the cardiac catheterization in a postabsorptive  state.  Preoperative sedation was achieved using IV Versed.  Groin was  prepped and draped in the usual sterile fashion.  Local anesthesia was  achieved using 1% Xylocaine.  A 6 French hemostasis sheath was placed into  the right femoral artery suing the modified Seldinger technique.  Selective  coronary angiography was performed using a JL4, JR4 Judkins catheter.  Multiple views were obtained.  All catheter exchanges were made over a  guidewire.  Single plain ventriculogram was performed in the RAO position  using the 6 French pigtail curved catheter.  The patient tolerated the  procedure well.  There was no immediate complications.  The films were  reviewed with Dr. Corliss Marcus.  Further intervention pending the discussion  between the patient and Dr. Amil Amen.   FINDINGS:  Aortic pressure is 130/79 with mean of 104.  LV pressure is 131/7  with EDP of 12.  Single plain ventriculogram revealed normal wall motion,  ejection fraction of 70%.  There was no mitral regurgitation noted.   Coronary angiography:  The left main bifurcates into the left anterior  descending and circumflex vessel. There is no significant disease in the  left main coronary artery.  There is mild calcification noted in the  proximal left anterior  descending. The left anterior descending gives rise  to a moderate large D1, moderate large D2, a smaller D3, it goes in as an  apical branch. There is a complex lesion in the proximal left anterior  descending that is involving the second and third diagonal.  The first  diagonal has luminal irregularities only.   Circumflex vessel:  Circumflex vessel is a moderate size vessel, gives rise  to trivial obtuse marginals and there is a small posterolateral branch.  There is no significant disease noted in the circumflex or its branches.   Right coronary artery:  The right coronary artery is a large artery.  There  are areas of ectasia noted on the right coronary artery in the proximal  portion.  There is an appearance of an ulcerated lesion in the distal right  coronary artery. There is a 50 to 60% proximal right coronary artery lesion  noted.  The PDA is subtotally occluded.   FINAL IMPRESSION:  1.  Complex disease involving the proximal left anterior descending,      diagonal 2 and diagonal 3.  2.  Subtotal posterior descending artery.  3.  Preserved systolic function,  ejection fraction 65 to 70%.  4.  Further intervention pending discussion with the patient.      HP/MEDQ  D:  07/04/2004  T:  07/04/2004  Job:  [id number]   cc:   Tasia Catchings, M.D.  [address]  [place]  [place] [address]  Fax: [phone]

## 2010-09-23 NOTE — Cardiovascular Report (Signed)
[name], [name]                ACCOUNT NO.:  [id number]   MEDICAL RECORD NO.:  [id number]          PATIENT TYPE:  OIB   LOCATION:  6524                         FACILITY:  [facility]   PHYSICIAN:  Francisca December, M.D.  DATE OF BIRTH:  18-Jul-1940   DATE OF PROCEDURE:  07/05/2004  DATE OF DISCHARGE:                              CARDIAC CATHETERIZATION   PROCEDURE:  1.  Percutaneous coronary intervention, drug-eluting stent implantation      proximal left anterior descending.  2.  Percutaneous coronary intervention, balloon dilatation only, first      diagonal branch.  3.  Percutaneous closure right femoral artery.   INDICATIONS:  Mr. Mark [name] is a 70 year old man who presented with  atypical angina.  He had symptoms of severe dyspnea and  lightheadedness/diaphoresis with exertion.  No clear anginal chest pain.  Dr. Meade Maw has completed coronary angiography revealing a complex LAD  diagonal stenosis.  He is to undergo catheter-based revascularization at  this time with the plan for a crushed stent technique.   DESCRIPTION OF PROCEDURE:  The patient was brought to the cardiac  catheterization laboratory in the fasting state.  The right groin was  prepped and draped in the usual sterile fashion.  Local anesthesia was  obtained with infiltration of 1% lidocaine.  A 7-French catheter sheath was  inserted percutaneously into the right femoral artery utilizing an anterior  approach over a guiding J wire.  The patient then received 0.75 mg/kg of  bivalirudin over two minutes and then a constant infusion of 1.75 mg/kg per  hour.  This resulted in ACT of greater than 400 seconds.  A 0.014 inch luge  intracoronary guidewire was passed across the lesion and into the diagonal  branch without excessive difficulty.  A second luge wire was advanced down  the anterior descending artery.  Initial balloon dilatation of the anterior  descending artery was performed with a 3.0/30 mm Voyager.   Three inflations  were performed for a peak pressure of 6 atmospheres for a peak duration 59  seconds.  A 2.5/20 mm Scimed Maverick intracoronary balloon is advanced into  the diagonal branch.  It was inflated on three occasions to a peak pressure  of 12 atmospheres for a peak duration of 84 seconds.  I then attempted to  advance a 2.5/18 mm Cypher intracoronary drug-eluting stent into the  diagonal.  It would not pass beyond the initial ostium despite adequate  backup.  It was removed and a 2.5/9 mm Scimed Monorail Lyon was advanced into  place and inflated 12 atmospheres for 46 seconds.  This was in the origin of  the diagonal and anterior descending artery.  This balloon was removed and  an attempt was made to pass a 2.5/16 mm Taxus intracoronary stent.  This was  unsuccessful.  We then attempted to use a buddy wire technique.  However,  was unsuccessful in advancing a high-torque floppy, extra support and a  grand slam wire into the diagonal period. They were entering a dissection  plane.  I, therefore, discontinued any further attempts at stent  implantation and dilated again in the diagonal with the 2.5/20 mm Maverick  to 5 atmospheres for 108 seconds.  I allowed the wire to remain in place and  a 3.5/28  mm Scimed Taxus intracoronary drug-eluting stent was advanced and  placed in the anterior descending artery.  It was inflated to 14 atmospheres  for 50 seconds.  This stent balloon was inflated and remove and a 3.25/20 mm  Scimed Quantum intracoronary balloon advanced and placed the more distal  portion of the stent, inflated to 16 atmospheres for 22 seconds.  It was  deflated and pulled proximally slightly and reinflated to 16 atmospheres for  23 seconds.  Finally the balloon was deflated and removed and following  confirmation of adequate patency and orthogonal views in the anterior  descending artery, the guiding catheter was removed.  There was residual 80%  stenosis in the ostium  of the diagonal.  A 45-degree RAO Finney femoral  arteriogram by hand injection via the sheath was performed.  It documented  adequate anatomy for deployment of the Angioseal device.  This was  successfully completed with good hemostasis and an intact distal pulse.  The  patient was transported to the recovery area in stable condition.   ANGIOGRAPHY:  The lesion was treated with a complex LAD diagonal stenosis.  The diagonal had an 80% stenosis at its origin and then was patent for about  1 cm and then there was a 70-80% stenosis that extended about 1 cm.  The  vessel was moderate in size.  The anterior descending artery itself had a  90% diffuse stenosis.  Following balloon dilatation and stent implantation,  there was no residual stenosis in the anterior descending artery and there  was an 80% stenosis at the origin of the diagonal branch.  There was some  dissection, nonpropagating and non-occlusive, seen about 10-15 mm more  distally in the diagonal.  TIMI-3 flow was present throughout.   FINAL IMPRESSION:  1.  Atherosclerotic coronary artery disease, two vessel.  2.  Status post successful percutaneous coronary intervention/drug-eluting      stent implantation in the left anterior descending.  3.  Unsuccessful percutaneous coronary intervention/balloon dilatation only      of the diagonal branch.  4.  Typical angina was not reproduced with device insertion or balloon      inflation.      JHE/MEDQ  D:  07/05/2004  T:  07/06/2004  Job:  [id number]   cc:   Tasia Catchings, M.D.  [address]  [place]  [place] [address]  Fax: [phone]

## 2010-10-07 ENCOUNTER — Telehealth: Payer: Self-pay | Admitting: *Deleted

## 2010-10-07 MED ORDER — RANITIDINE HCL 150 MG PO TABS: 150.0000 mg | ORAL_TABLET | Freq: Two times a day (BID) | ORAL | Status: DC

## 2010-10-07 MED ORDER — ISOSORBIDE DINITRATE 20 MG PO TABS: 20.0000 mg | ORAL_TABLET | Freq: Three times a day (TID) | ORAL | Status: DC

## 2010-10-07 NOTE — Telephone Encounter (Signed)
Ranitidine rx to [facility] was printed and faxed to [phone].

## 2010-10-07 NOTE — Telephone Encounter (Signed)
Received message from pt stating [facility] is unable to get a supply of Isosorbide Dinitrate until middle of July. Pt needs refill sent to [facility] for 1 month supply with additional refill. He also requests that refill of ranitidine 150mg  bid be sent to mail order. Pt notified requests have been completed.

## 2010-10-12 ENCOUNTER — Encounter: Payer: Self-pay | Admitting: Internal Medicine

## 2010-10-17 ENCOUNTER — Encounter: Payer: Self-pay | Admitting: Internal Medicine

## 2010-10-27 ENCOUNTER — Other Ambulatory Visit: Payer: Self-pay | Admitting: *Deleted

## 2010-10-27 MED ORDER — METOPROLOL TARTRATE 100 MG PO TABS: 100.0000 mg | ORAL_TABLET | Freq: Two times a day (BID) | ORAL | Status: DC

## 2010-10-27 NOTE — Telephone Encounter (Signed)
Patient called and left voice message requesting refill on Metoprolol and Losartan. He is requesting rx refill to [facility].   Call was placed to patient at [phone], he was informed that his rx's would be refilled, however additional refills would require office visit. He was informed per Dr Artist Pais last office note he was to schedule a 6 month follow to have CPX. Patient stated he was informed that he could not just stop taking his medication, and wanted to know what was he suppose to do.Patient was reminded the refill would take him out at least 3 months, and patient may not have heard what was being said to him, because he was constantly speaking and he was being informed. Patient hung the phone up in the middle of me explaining to him the status of his refills. The call was disconnected.  Rx refill per Dr Rodena Medin authorize for 30 days only. Patient will need to schedule office visit for future refills. Rx for Losartan not on patients current list of medication

## 2010-11-10 ENCOUNTER — Encounter: Payer: Self-pay | Admitting: Internal Medicine

## 2010-11-10 ENCOUNTER — Ambulatory Visit (INDEPENDENT_AMBULATORY_CARE_PROVIDER_SITE_OTHER): Payer: Medicare PPO | Admitting: Internal Medicine

## 2010-11-10 DIAGNOSIS — N259 Disorder resulting from impaired renal tubular function, unspecified: Secondary | ICD-10-CM

## 2010-11-10 DIAGNOSIS — N289 Disorder of kidney and ureter, unspecified: Secondary | ICD-10-CM

## 2010-11-10 DIAGNOSIS — E785 Hyperlipidemia, unspecified: Secondary | ICD-10-CM

## 2010-11-10 DIAGNOSIS — N400 Enlarged prostate without lower urinary tract symptoms: Secondary | ICD-10-CM

## 2010-11-10 DIAGNOSIS — I10 Essential (primary) hypertension: Secondary | ICD-10-CM

## 2010-11-10 DIAGNOSIS — N401 Enlarged prostate with lower urinary tract symptoms: Secondary | ICD-10-CM

## 2010-11-10 LAB — CBC WITH DIFFERENTIAL/PLATELET
Hemoglobin: 14.1 g/dL (ref 13.0–17.0)
Lymphocytes Relative: 24 % (ref 12–46)
Lymphs Abs: 1.6 10*3/uL (ref 0.7–4.0)
MCH: 30 pg (ref 26.0–34.0)
Monocytes Relative: 7 % (ref 3–12)
Neutro Abs: 4.5 10*3/uL (ref 1.7–7.7)
Neutrophils Relative %: 67 % (ref 43–77)
Platelets: 192 10*3/uL (ref 150–400)
RBC: 4.7 MIL/uL (ref 4.22–5.81)
WBC: 6.7 10*3/uL (ref 4.0–10.5)

## 2010-11-10 LAB — HEPATIC FUNCTION PANEL
AST: 22 U/L (ref 0–37)
Albumin: 4.4 g/dL (ref 3.5–5.2)
Bilirubin, Direct: 0.2 mg/dL (ref 0.0–0.3)
Total Bilirubin: 0.8 mg/dL (ref 0.3–1.2)

## 2010-11-10 LAB — BASIC METABOLIC PANEL
CO2: 31 mEq/L (ref 19–32)
Chloride: 104 mEq/L (ref 96–112)
Glucose, Bld: 87 mg/dL (ref 70–99)
Potassium: 4 mEq/L (ref 3.5–5.3)
Sodium: 142 mEq/L (ref 135–145)

## 2010-11-10 LAB — LIPID PANEL
HDL: 34 mg/dL — ABNORMAL LOW (ref >39)
Total CHOL/HDL Ratio: 4.7 Ratio
VLDL: 26 mg/dL (ref 0–40)

## 2010-11-10 MED ORDER — SIMVASTATIN 20 MG PO TABS: 20.0000 mg | ORAL_TABLET | Freq: Every day | ORAL | Status: DC

## 2010-11-10 MED ORDER — METOPROLOL TARTRATE 100 MG PO TABS: 100.0000 mg | ORAL_TABLET | Freq: Two times a day (BID) | ORAL | Status: DC

## 2010-11-10 MED ORDER — SERTRALINE HCL 100 MG PO TABS: 100.0000 mg | ORAL_TABLET | Freq: Every day | ORAL | Status: DC

## 2010-11-10 MED ORDER — RANITIDINE HCL 150 MG PO TABS: 150.0000 mg | ORAL_TABLET | Freq: Two times a day (BID) | ORAL | Status: DC

## 2010-11-10 MED ORDER — AMLODIPINE BESYLATE 10 MG PO TABS: 10.0000 mg | ORAL_TABLET | Freq: Every day | ORAL | Status: DC

## 2010-11-10 NOTE — Patient Instructions (Signed)
Lipid/lft 272.4  Cbc/chem7 (renal insufficiency) before next appt

## 2010-11-11 LAB — PSA: PSA: 2.28 ng/mL (ref <=4.00)

## 2010-11-12 NOTE — Assessment & Plan Note (Signed)
Discussed pro's and cons of psa screening. Obtain psa

## 2010-11-12 NOTE — Assessment & Plan Note (Signed)
Continue statin tx. Obtain flp/lft 

## 2010-11-12 NOTE — Progress Notes (Signed)
  Subjective:    Patient ID: Mark [name], male    DOB: 06-24-1940, 70 y.o.   MRN: [id number]  HPI Pt presents to clinic for f/u of mulitple medical problems. Tolerates statin tx without myalgias or abn lft. H/o GERD with c/o heartburn or dysphagia. Suffers from depression but believes is currently well controlled. H/o CRI avoiding nsaids. States h/o AAA now s/p stent placement. No active complaints.  Reviewed pmh, medications and allergies    Review of Systems see hpi     Objective:   Physical Exam  Nursing note and vitals reviewed. Constitutional: Mark [name] appears well-developed and well-nourished. No distress.  HENT:  Head: Normocephalic and atraumatic.  Right Ear: Tympanic membrane, external ear and ear canal normal.  Left Ear: Tympanic membrane, external ear and ear canal normal.  Nose: Nose normal.  Mouth/Throat: Oropharynx is clear and moist. No oropharyngeal exudate.  Eyes: Conjunctivae and EOM are normal. Pupils are equal, round, and reactive to light. No scleral icterus.  Neck: Neck supple. No JVD present. Carotid bruit is not present.  Cardiovascular: Normal rate, regular rhythm and normal heart sounds.  Exam reveals no gallop and no friction rub.   No murmur heard. Pulmonary/Chest: Effort normal and breath sounds normal. No respiratory distress. Mark [name] has no wheezes. Mark [name] has no rales.  Abdominal: Soft. Bowel sounds are normal. Mark [name] exhibits no distension and no mass. There is no tenderness. There is no rebound and no guarding. Hernia confirmed negative in the right inguinal area and confirmed negative in the left inguinal area.  Genitourinary: Testes normal. Right testis shows no mass and no tenderness. Left testis shows no mass and no tenderness.  Lymphadenopathy:    Mark [name] has no cervical adenopathy.       Right: No inguinal adenopathy present.       Left: No inguinal adenopathy present.  Neurological: Mark [name] is alert.  Skin: Skin is warm and dry. Mark [name] is not diaphoretic.  Psychiatric:  Mark [name] has a normal mood and affect.          Assessment & Plan:

## 2010-11-12 NOTE — Assessment & Plan Note (Signed)
Stable. No evidence of volume overload. Avoid nsaids. Obtain chem7

## 2010-11-12 NOTE — Assessment & Plan Note (Signed)
Stable. Continue current regimen. Obtain cbc and ua.

## 2010-11-15 ENCOUNTER — Encounter: Payer: Self-pay | Admitting: Internal Medicine

## 2010-11-17 ENCOUNTER — Other Ambulatory Visit: Payer: Self-pay | Admitting: Internal Medicine

## 2010-11-17 DIAGNOSIS — I10 Essential (primary) hypertension: Secondary | ICD-10-CM

## 2010-11-18 NOTE — Telephone Encounter (Signed)
Pt last seen 11/10/10, follow up appt on 05/11/11.  6 month rx sent.

## 2011-01-24 ENCOUNTER — Telehealth: Payer: Self-pay | Admitting: Internal Medicine

## 2011-01-24 DIAGNOSIS — E785 Hyperlipidemia, unspecified: Secondary | ICD-10-CM

## 2011-01-24 DIAGNOSIS — N289 Disorder of kidney and ureter, unspecified: Secondary | ICD-10-CM

## 2011-01-24 NOTE — Telephone Encounter (Signed)
Please order labs. Lipid/lft 272.4 Cbc/chem7 (renal insufficiency) before next appt on 05-21-11. Patient will be going downstairs.

## 2011-01-25 NOTE — Telephone Encounter (Signed)
Lab orders entered for December 2012. 

## 2011-02-02 ENCOUNTER — Encounter: Payer: Self-pay | Admitting: Internal Medicine

## 2011-02-02 ENCOUNTER — Ambulatory Visit (INDEPENDENT_AMBULATORY_CARE_PROVIDER_SITE_OTHER): Payer: Medicare PPO | Admitting: Internal Medicine

## 2011-02-02 DIAGNOSIS — R634 Abnormal weight loss: Secondary | ICD-10-CM

## 2011-02-02 DIAGNOSIS — R350 Frequency of micturition: Secondary | ICD-10-CM | POA: Insufficient documentation

## 2011-02-02 DIAGNOSIS — R112 Nausea with vomiting, unspecified: Secondary | ICD-10-CM

## 2011-02-02 LAB — CBC WITH DIFFERENTIAL/PLATELET
Basophils Absolute: 0 10*3/uL (ref 0.0–0.1)
Basophils Relative: 0 % (ref 0–1)
Eosinophils Relative: 0 % (ref 0–5)
HCT: 46.3 % (ref 39.0–52.0)
Hemoglobin: 15.2 g/dL (ref 13.0–17.0)
MCHC: 32.8 g/dL (ref 30.0–36.0)
MCV: 91.9 fL (ref 78.0–100.0)
Monocytes Absolute: 1.2 10*3/uL — ABNORMAL HIGH (ref 0.1–1.0)
Monocytes Relative: 11 % (ref 3–12)
RDW: 14.7 % (ref 11.5–15.5)

## 2011-02-02 LAB — BASIC METABOLIC PANEL
BUN: 31 mg/dL — ABNORMAL HIGH (ref 6–23)
Chloride: 98 mEq/L (ref 96–112)
Creat: 2.18 mg/dL — ABNORMAL HIGH (ref 0.50–1.35)
Glucose, Bld: 109 mg/dL — ABNORMAL HIGH (ref 70–99)
Potassium: 4 mEq/L (ref 3.5–5.3)

## 2011-02-02 LAB — HEPATIC FUNCTION PANEL
ALT: 11 U/L (ref 0–53)
AST: 22 U/L (ref 0–37)
Albumin: 4.4 g/dL (ref 3.5–5.2)
Bilirubin, Direct: 0.4 mg/dL — ABNORMAL HIGH (ref 0.0–0.3)
Total Bilirubin: 1.5 mg/dL — ABNORMAL HIGH (ref 0.3–1.2)

## 2011-02-02 LAB — URINALYSIS, ROUTINE W REFLEX MICROSCOPIC
Glucose, UA: NEGATIVE mg/dL
Hgb urine dipstick: NEGATIVE
Protein, ur: >300 mg/dL — AB
Urobilinogen, UA: 1 mg/dL (ref 0.0–1.0)

## 2011-02-02 LAB — AMYLASE: Amylase: 40 U/L (ref 0–105)

## 2011-02-02 MED ORDER — CIPROFLOXACIN HCL 500 MG PO TABS: 500.0000 mg | ORAL_TABLET | Freq: Two times a day (BID) | ORAL | Status: AC

## 2011-02-02 NOTE — Assessment & Plan Note (Signed)
Obtain ua and cx. Empiric course of cipro for possible prostatitis. Close followup scheduled for re-evaluation

## 2011-02-02 NOTE — Progress Notes (Signed)
  Subjective:    Patient ID: Mark [name], male    DOB: October 14, 1940, 70 y.o.   MRN: [id number]  HPI Pt presents to clinic for evaluation of wt loss and urinary frequency. Notes unintended wt loss x 3wks noted to be ~ 7lbs. Has associated decreased appetite, nocturia 3-4x/night, urinary frequency and bilateral inguinal pain. Had single episode of n/v yesterday. Denies f/c, dysuria, cp, dyspnea,  or blood in stool. No obvious alleviating or exacerbating factors. Home bp's nl. No other complaints.  Past Medical History  Diagnosis Date  . Hypertension   . Hyperlipidemia   . CAD (coronary artery disease)   . History of nephrolithiasis   . GERD (gastroesophageal reflux disease)   . Depression   . History of skin cancer   . History of pneumothorax   . AAA (abdominal aortic aneurysm)     9.1 cm repair 05/2009   Past Surgical History  Procedure Date  . Cardiac catheterization 06/2004    reports that he quit smoking about 30 years ago. He does not have any smokeless tobacco history on file. He reports that he drinks alcohol. His drug history not on file. family history includes Anuerysm in his father; Coronary artery disease in an unspecified family member; Hyperlipidemia in an unspecified family member; Hypertension in an unspecified family member; Prostate cancer in an unspecified family member; and Pulmonary embolism in his mother.  There is no history of Other. Allergies  Allergen Reactions  . Ace Inhibitors     REACTION: Cough       Review of Systems see hpi     Objective:   Physical Exam  Nursing note and vitals reviewed. Constitutional: He appears well-developed and well-nourished. No distress.  HENT:  Head: Normocephalic and atraumatic.  Right Ear: Tympanic membrane, external ear and ear canal normal.  Left Ear: Tympanic membrane, external ear and ear canal normal.  Nose: Nose normal.  Mouth/Throat: Oropharynx is clear and moist. No oropharyngeal exudate.  Eyes:  Conjunctivae are normal. Pupils are equal, round, and reactive to light. Right eye exhibits no discharge. Left eye exhibits no discharge. No scleral icterus.  Neck: Neck supple.  Cardiovascular: Normal rate, regular rhythm and normal heart sounds.  Exam reveals no gallop and no friction rub.   No murmur heard. Pulmonary/Chest: Effort normal and breath sounds normal. No respiratory distress. He has no wheezes. He has no rales.  Abdominal: Soft. Bowel sounds are normal. He exhibits no distension and no mass. There is no tenderness. There is no rebound.  Lymphadenopathy:    He has no cervical adenopathy.  Neurological: He is alert.  Skin: Skin is warm and dry. He is not diaphoretic.  Psychiatric: He has a normal mood and affect.          Assessment & Plan:

## 2011-02-02 NOTE — Assessment & Plan Note (Signed)
Obtain cbc, chem7, esr, amylase, lipase. Schedule close followup

## 2011-02-03 LAB — URINALYSIS, MICROSCOPIC ONLY: Bacteria, UA: NONE SEEN

## 2011-02-03 LAB — SEDIMENTATION RATE: Sed Rate: 30 mm/hr — ABNORMAL HIGH (ref 0–16)

## 2011-02-09 ENCOUNTER — Ambulatory Visit (INDEPENDENT_AMBULATORY_CARE_PROVIDER_SITE_OTHER): Payer: Medicare PPO | Admitting: Internal Medicine

## 2011-02-09 ENCOUNTER — Encounter: Payer: Self-pay | Admitting: Internal Medicine

## 2011-02-09 DIAGNOSIS — R748 Abnormal levels of other serum enzymes: Secondary | ICD-10-CM

## 2011-02-09 DIAGNOSIS — N419 Inflammatory disease of prostate, unspecified: Secondary | ICD-10-CM

## 2011-02-09 DIAGNOSIS — Z1211 Encounter for screening for malignant neoplasm of colon: Secondary | ICD-10-CM

## 2011-02-09 DIAGNOSIS — N289 Disorder of kidney and ureter, unspecified: Secondary | ICD-10-CM

## 2011-02-09 LAB — BASIC METABOLIC PANEL
CO2: 27 mEq/L (ref 19–32)
Calcium: 9.2 mg/dL (ref 8.4–10.5)
Creat: 1.5 mg/dL — ABNORMAL HIGH (ref 0.50–1.35)
Sodium: 139 mEq/L (ref 135–145)

## 2011-02-09 LAB — HEPATIC FUNCTION PANEL
ALT: 26 U/L (ref 0–53)
AST: 28 U/L (ref 0–37)
Bilirubin, Direct: 0.2 mg/dL (ref 0.0–0.3)
Indirect Bilirubin: 0.4 mg/dL (ref 0.0–0.9)
Total Protein: 6.8 g/dL (ref 6.0–8.3)

## 2011-02-12 DIAGNOSIS — N289 Disorder of kidney and ureter, unspecified: Secondary | ICD-10-CM | POA: Insufficient documentation

## 2011-02-12 DIAGNOSIS — R748 Abnormal levels of other serum enzymes: Secondary | ICD-10-CM | POA: Insufficient documentation

## 2011-02-12 DIAGNOSIS — N419 Inflammatory disease of prostate, unspecified: Secondary | ICD-10-CM | POA: Insufficient documentation

## 2011-02-12 NOTE — Progress Notes (Signed)
  Subjective:    Patient ID: Mark [name], male    DOB: July 16, 1940, 70 y.o.   MRN: [id number]  HPI Pt presents to clinic for followup of multiple medical problems. Recently tx'ed with abx course for possible prostatitis. Notes resolution of wt loss, decreased appetite and notes improved energy since taking abx. Reviewed elevated cr 2.18 up from baseline elevation as well as increased TB. States last colonoscopy over 10 years ago. Nocturia almost resolved and has no inguinal pain. Feels like voice mildly weak the last several days. Has some sinus drainage. No other complaints.  Past Medical History  Diagnosis Date  . Hypertension   . Hyperlipidemia   . CAD (coronary artery disease)   . History of nephrolithiasis   . GERD (gastroesophageal reflux disease)   . Depression   . History of skin cancer   . History of pneumothorax   . AAA (abdominal aortic aneurysm)     9.1 cm repair 05/2009   Past Surgical History  Procedure Date  . Cardiac catheterization 06/2004    reports that he quit smoking about 30 years ago. He has never used smokeless tobacco. He reports that he drinks alcohol. His drug history not on file. family history includes Anuerysm in his father; Coronary artery disease in an unspecified family member; Hyperlipidemia in an unspecified family member; Hypertension in an unspecified family member; Prostate cancer in an unspecified family member; and Pulmonary embolism in his mother.  There is no history of Other. Allergies  Allergen Reactions  . Ace Inhibitors     REACTION: Cough      Review of Systems see hpi    Objective:   Physical Exam  Nursing note and vitals reviewed. Constitutional: He appears well-developed and well-nourished. No distress.  HENT:  Head: Normocephalic and atraumatic.  Right Ear: Tympanic membrane, external ear and ear canal normal.  Left Ear: Tympanic membrane, external ear and ear canal normal.  Nose: Nose normal.  Mouth/Throat: Oropharynx is  clear and moist. No oropharyngeal exudate.       Op clear drainage   Skin: He is not diaphoretic.          Assessment & Plan:

## 2011-02-12 NOTE — Assessment & Plan Note (Signed)
Increase from baseline with recent infxn. Repeat chem7

## 2011-02-12 NOTE — Assessment & Plan Note (Signed)
Repeat lft

## 2011-02-15 ENCOUNTER — Other Ambulatory Visit: Payer: Self-pay | Admitting: Internal Medicine

## 2011-02-16 NOTE — Telephone Encounter (Signed)
Refills sent to mail order for #45 x 1 refill on each medication.

## 2011-02-16 NOTE — Telephone Encounter (Signed)
Patient returned call. Best # [phone]

## 2011-02-16 NOTE — Telephone Encounter (Signed)
Ok [id number] 

## 2011-02-16 NOTE — Telephone Encounter (Signed)
Spoke to pt and verified that he is currently taking Norvasc and Triamterene-HCTZ. Pt states he is taking 1/2 tablet daily on each of these medications. Please advise if ok to refill both meds and how many refills?

## 2011-02-16 NOTE — Telephone Encounter (Signed)
Need to verify Norvasc directions and that pt is taking Triamterene-HCT as it is not on current med list. Also noted 90 day supply with 2 refills of Norvasc on 11/10/10. Should still have refills remaining.  Per pharmacist at [facility], she states last refill on both medications was 05/12/10 #45 1/2 tablet daily on each and meds were prescribed by Dr Artist Pais.  Left message for pt to return my call for clarification.

## 2011-03-14 ENCOUNTER — Other Ambulatory Visit: Payer: Self-pay | Admitting: Internal Medicine

## 2011-03-14 ENCOUNTER — Encounter: Payer: Self-pay | Admitting: Internal Medicine

## 2011-03-15 ENCOUNTER — Telehealth: Payer: Self-pay | Admitting: Internal Medicine

## 2011-03-15 MED ORDER — TRIAMTERENE-HCTZ 37.5-25 MG PO TABS: ORAL_TABLET | ORAL | Status: DC

## 2011-03-15 MED ORDER — ISOSORBIDE DINITRATE 20 MG PO TABS: 20.0000 mg | ORAL_TABLET | Freq: Three times a day (TID) | ORAL | Status: DC

## 2011-03-15 MED ORDER — AMLODIPINE BESYLATE 10 MG PO TABS: ORAL_TABLET | ORAL | Status: DC

## 2011-03-15 NOTE — Telephone Encounter (Signed)
Rx refills sent to pharmacy. 

## 2011-03-15 NOTE — Telephone Encounter (Signed)
TRIAMTERINE HYDROCHLOTHIAZIDE 37.5 25 MG TAKE 1/2 PER DAY QTY 45  AMLODIPINE 10 MG 1/2 PILL PER DAY QTY 45  ISORIL 20 MG TAKE 3 PER DAY 270 QTY  [facility]

## 2011-03-15 NOTE — Telephone Encounter (Signed)
Rx refill sent to pharmacy. 

## 2011-04-06 ENCOUNTER — Ambulatory Visit (AMBULATORY_SURGERY_CENTER): Payer: Medicare PPO

## 2011-04-06 ENCOUNTER — Encounter: Payer: Self-pay | Admitting: Internal Medicine

## 2011-04-06 VITALS — Ht 71.0 in | Wt 221.0 lb

## 2011-04-06 DIAGNOSIS — Z1211 Encounter for screening for malignant neoplasm of colon: Secondary | ICD-10-CM

## 2011-04-06 MED ORDER — PEG-KCL-NACL-NASULF-NA ASC-C 100 G PO SOLR: 1.0000 | Freq: Once | ORAL | Status: DC

## 2011-04-20 ENCOUNTER — Other Ambulatory Visit: Payer: Medicare PPO | Admitting: Internal Medicine

## 2011-05-11 ENCOUNTER — Ambulatory Visit (INDEPENDENT_AMBULATORY_CARE_PROVIDER_SITE_OTHER): Payer: Medicare PPO | Admitting: Internal Medicine

## 2011-05-11 ENCOUNTER — Encounter: Payer: Self-pay | Admitting: Internal Medicine

## 2011-05-11 ENCOUNTER — Telehealth: Payer: Self-pay | Admitting: Internal Medicine

## 2011-05-11 VITALS — BP 130/80 | HR 69 | Temp 97.9°F | Resp 18 | Wt 227.0 lb

## 2011-05-11 DIAGNOSIS — E785 Hyperlipidemia, unspecified: Secondary | ICD-10-CM

## 2011-05-11 DIAGNOSIS — N259 Disorder resulting from impaired renal tubular function, unspecified: Secondary | ICD-10-CM

## 2011-05-11 DIAGNOSIS — N529 Male erectile dysfunction, unspecified: Secondary | ICD-10-CM

## 2011-05-11 DIAGNOSIS — Z23 Encounter for immunization: Secondary | ICD-10-CM

## 2011-05-11 NOTE — Progress Notes (Signed)
  Subjective:    Patient ID: Mark [name], male    DOB: Jan 15, 1941, 71 y.o.   MRN: [id number]  HPI Pt presents to clinic for followup of multiple medical problems. Did not go forward with colonoscopy. Aware of colorectal cancer screening recommendations. 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 Has had discussion with a medical supple company re possible vacuum pump. No other complaints.  Past Medical History  Diagnosis Date  . Hypertension   . Hyperlipidemia   . CAD (coronary artery disease)   . History of nephrolithiasis   . GERD (gastroesophageal reflux disease)   . Depression   . History of skin cancer   . History of pneumothorax   . AAA (abdominal aortic aneurysm)     9.1 cm repair 05/2009   Past Surgical History  Procedure Date  . Cardiac catheterization 06/2004    reports that he quit smoking about 30 years ago. He has never used smokeless tobacco. He reports that he drinks alcohol. His drug history not on file. family history includes Anuerysm in his father; Coronary artery disease in an unspecified family member; Hyperlipidemia in an unspecified family member; Hypertension in an unspecified family member; Prostate cancer in an unspecified family member; and Pulmonary embolism in his mother.  There is no history of Other and Colon cancer. Allergies  Allergen Reactions  . Ace Inhibitors     REACTION: Cough     Review of Systems see hpi     Objective:   Physical Exam  Physical Exam  Nursing note and vitals reviewed. Constitutional: Appears well-developed and well-nourished. No distress.  HENT:  Head: Normocephalic and atraumatic.  Right Ear: External ear normal.  Left Ear: External ear normal.  Eyes: Conjunctivae are normal. No scleral icterus.  Neck: Neck supple. .  Cardiovascular: Normal rate, regular rhythm and normal heart sounds.  Exam reveals no gallop and no friction rub.   No murmur heard. Pulmonary/Chest: Effort normal and  breath sounds normal. No respiratory distress. He has no wheezes. no rales.  Lymphadenopathy:    He has no cervical adenopathy.  Neurological:Alert.  Skin: Skin is warm and dry. Not diaphoretic.  Psychiatric: Has a normal mood and affect.        Assessment & Plan:

## 2011-05-11 NOTE — Telephone Encounter (Signed)
Lab orders entered for  January and July 2013.

## 2011-05-11 NOTE — Assessment & Plan Note (Signed)
Avoid medication due to nitroglycerin use. Ok to proceed with vacuum pump.

## 2011-05-11 NOTE — Assessment & Plan Note (Signed)
Obtain chem7 

## 2011-05-11 NOTE — Telephone Encounter (Signed)
Please schedule labs for next week. Chem7(renal insufficiency) and lipid/lft 272.4   Also, patient has upcoming appt on 11/08/11. Please schedule cbc,chem7(renal insufficiency) lipid/lft 272.4 prior to visit. Patient will be going to [place] [facility].

## 2011-05-11 NOTE — Assessment & Plan Note (Signed)
Obtain lipid/lft. 

## 2011-05-11 NOTE — Patient Instructions (Signed)
Please schedule chem7(renal insufficiency) and lipid/lft 272.4 next week. Also schedule cbc, chem7 (renal insufficiency) and lipid/lft 272.4 prior to next visit

## 2011-05-17 ENCOUNTER — Other Ambulatory Visit: Payer: Self-pay | Admitting: *Deleted

## 2011-05-17 ENCOUNTER — Telehealth: Payer: Self-pay | Admitting: Internal Medicine

## 2011-05-17 DIAGNOSIS — I10 Essential (primary) hypertension: Secondary | ICD-10-CM

## 2011-05-17 DIAGNOSIS — N259 Disorder resulting from impaired renal tubular function, unspecified: Secondary | ICD-10-CM

## 2011-05-17 DIAGNOSIS — E785 Hyperlipidemia, unspecified: Secondary | ICD-10-CM

## 2011-05-17 DIAGNOSIS — N289 Disorder of kidney and ureter, unspecified: Secondary | ICD-10-CM

## 2011-05-17 LAB — BASIC METABOLIC PANEL
CO2: 27 mEq/L (ref 19–32)
Calcium: 9 mg/dL (ref 8.4–10.5)
Creat: 1.34 mg/dL (ref 0.50–1.35)

## 2011-05-17 LAB — LIPID PANEL
HDL: 41 mg/dL (ref >39)
Triglycerides: 62 mg/dL (ref <150)

## 2011-05-17 LAB — HEPATIC FUNCTION PANEL
AST: 16 U/L (ref 0–37)
Albumin: 3.9 g/dL (ref 3.5–5.2)
Alkaline Phosphatase: 69 U/L (ref 39–117)
Total Bilirubin: 0.6 mg/dL (ref 0.3–1.2)

## 2011-05-17 MED ORDER — SIMVASTATIN 20 MG PO TABS: 20.0000 mg | ORAL_TABLET | Freq: Every day | ORAL | Status: DC

## 2011-05-17 MED ORDER — LOSARTAN POTASSIUM 25 MG PO TABS: 25.0000 mg | ORAL_TABLET | Freq: Every day | ORAL | Status: DC

## 2011-05-17 NOTE — Telephone Encounter (Signed)
Rx refills sent to pharmacy. 

## 2011-05-17 NOTE — Telephone Encounter (Signed)
Patient came in office wanting refill of losartan and simvastatin to be sent to [facility]  Also, patient states that he is almost out of simvastatin and would like that called in to [facility] pharmacy.-30 day supply.

## 2011-09-21 ENCOUNTER — Other Ambulatory Visit: Payer: Self-pay | Admitting: *Deleted

## 2011-09-21 DIAGNOSIS — I10 Essential (primary) hypertension: Secondary | ICD-10-CM

## 2011-09-21 MED ORDER — METOPROLOL TARTRATE 100 MG PO TABS: 100.0000 mg | ORAL_TABLET | Freq: Two times a day (BID) | ORAL | Status: DC

## 2011-09-21 MED ORDER — RANITIDINE HCL 150 MG PO TABS: 150.0000 mg | ORAL_TABLET | Freq: Two times a day (BID) | ORAL | Status: DC

## 2011-09-21 MED ORDER — ISOSORBIDE DINITRATE 20 MG PO TABS: 20.0000 mg | ORAL_TABLET | Freq: Three times a day (TID) | ORAL | Status: DC

## 2011-09-21 NOTE — Telephone Encounter (Signed)
Patient called and left voice message requesting refill on Metoprolol, isosorbide and Zantac to [facility]. His message stated the pharmacy informed him they have requested refills on these medications and have not heard anything from the office. Patient stated in his message that he a follow up appointment for July 2013 with Dr Rodena Medin.  Rx refills sent to pharmacy.  Call placed to patient at [phone], no answer.  A detailed voice message was left informing patient Rx request was not received from pharmacy, however the refill were sent. He was advised to call back if any questions.

## 2011-11-08 ENCOUNTER — Ambulatory Visit (INDEPENDENT_AMBULATORY_CARE_PROVIDER_SITE_OTHER): Payer: Medicare PPO | Admitting: Internal Medicine

## 2011-11-08 ENCOUNTER — Encounter: Payer: Self-pay | Admitting: Internal Medicine

## 2011-11-08 VITALS — BP 140/88 | HR 65 | Temp 98.0°F | Wt 233.0 lb

## 2011-11-08 DIAGNOSIS — I714 Abdominal aortic aneurysm, without rupture: Secondary | ICD-10-CM

## 2011-11-08 DIAGNOSIS — E785 Hyperlipidemia, unspecified: Secondary | ICD-10-CM

## 2011-11-08 DIAGNOSIS — I10 Essential (primary) hypertension: Secondary | ICD-10-CM

## 2011-11-08 LAB — CBC
HCT: 40.7 % (ref 39.0–52.0)
MCHC: 35.1 g/dL (ref 30.0–36.0)
MCV: 85.9 fL (ref 78.0–100.0)
RDW: 15.1 % (ref 11.5–15.5)

## 2011-11-08 NOTE — Addendum Note (Signed)
Addended by: Mervin Kung A on: 11/08/2011 08:45 AM   Modules accepted: Orders

## 2011-11-08 NOTE — Assessment & Plan Note (Addendum)
Mildly suboptimal control. Increase losartan 50mg  qd. Monitor bp as outpt and f/u in clinic as scheduled. Obtain cbc and chem7

## 2011-11-08 NOTE — Assessment & Plan Note (Signed)
Obtain lipid profile. 

## 2011-11-08 NOTE — Progress Notes (Signed)
  Subjective:    Patient ID: Mark [name], male    DOB: 1941-04-26, 71 y.o.   MRN: [id number]  HPI Pt presents to clinic for followup of multiple medical problems. BP remains mildly elevated. Compliant with medication without adverse effect. H/o AAA previously followed by vascular surgery however last evaluation ~2011. Attempting to change eating habits to lose weight.  Past Medical History  Diagnosis Date  . Hypertension   . Hyperlipidemia   . CAD (coronary artery disease)   . History of nephrolithiasis   . GERD (gastroesophageal reflux disease)   . Depression   . History of skin cancer   . History of pneumothorax   . AAA (abdominal aortic aneurysm)     9.1 cm repair 05/2009   Past Surgical History  Procedure Date  . Cardiac catheterization 06/2004    reports that he quit smoking about 31 years ago. He has never used smokeless tobacco. He reports that he drinks about 1.5 ounces of alcohol per week. He reports that he does not use illicit drugs. family history includes Anuerysm in his father; Coronary artery disease in an unspecified family member; Hyperlipidemia in an unspecified family member; Hypertension in an unspecified family member; Prostate cancer in an unspecified family member; and Pulmonary embolism in his mother.  There is no history of Other and Colon cancer. Allergies  Allergen Reactions  . Ace Inhibitors     REACTION: Cough      Review of Systems see hpi     Objective:   Physical Exam  Physical Exam  Nursing note and vitals reviewed. Constitutional: Appears well-developed and well-nourished. No distress.  HENT:  Head: Normocephalic and atraumatic.  Right Ear: External ear normal.  Left Ear: External ear normal.  Eyes: Conjunctivae are normal. No scleral icterus.  Neck: Neck supple. Carotid bruit is not present.  Cardiovascular: Normal rate, regular rhythm and normal heart sounds.  Exam reveals no gallop and no friction rub.   No murmur  heard. Pulmonary/Chest: Effort normal and breath sounds normal. No respiratory distress. He has no wheezes. no rales.  Lymphadenopathy:    He has no cervical adenopathy.  Neurological:Alert.  Skin: Skin is warm and dry. Not diaphoretic.  Psychiatric: Has a normal mood and affect.        Assessment & Plan:

## 2011-11-08 NOTE — Assessment & Plan Note (Signed)
Needs f/u with vascular. States will call for appt

## 2011-11-08 NOTE — Progress Notes (Signed)
Pt presented to the lab, future orders released. 

## 2011-11-09 LAB — BASIC METABOLIC PANEL
BUN: 20 mg/dL (ref 6–23)
Creat: 1.37 mg/dL — ABNORMAL HIGH (ref 0.50–1.35)

## 2011-11-09 LAB — LIPID PANEL
Cholesterol: 153 mg/dL (ref 0–200)
LDL Cholesterol: 98 mg/dL (ref 0–99)
Triglycerides: 97 mg/dL (ref <150)

## 2011-11-16 ENCOUNTER — Telehealth: Payer: Self-pay | Admitting: Internal Medicine

## 2011-11-20 MED ORDER — LOSARTAN POTASSIUM 50 MG PO TABS: 50.0000 mg | ORAL_TABLET | Freq: Every day | ORAL | Status: DC

## 2011-11-20 NOTE — Telephone Encounter (Signed)
Refill sent to [facility] for losartan #90 x 1 refill.

## 2011-12-01 ENCOUNTER — Telehealth: Payer: Self-pay | Admitting: *Deleted

## 2011-12-01 MED ORDER — SERTRALINE HCL 100 MG PO TABS: 100.0000 mg | ORAL_TABLET | Freq: Every day | ORAL | Status: DC

## 2011-12-01 NOTE — Telephone Encounter (Signed)
Refills sent

## 2011-12-01 NOTE — Telephone Encounter (Signed)
Request for refill Sertraline 100 mg to [facility] [last refill 07.05.12 #90x2]/SLS Please advise.

## 2011-12-01 NOTE — Telephone Encounter (Signed)
#  90 rf3 

## 2011-12-21 ENCOUNTER — Telehealth: Payer: Self-pay | Admitting: *Deleted

## 2011-12-21 NOTE — Telephone Encounter (Signed)
FYI: [facility] will be faxing medical clearance form for patient to participate in either: weight loss, aerobics, and/or weight training research study program-believes to be in line with discussed health needs; unsure of which program he will be placed in. Form received & placed in your folder/SLS

## 2011-12-28 ENCOUNTER — Other Ambulatory Visit: Payer: Self-pay | Admitting: Internal Medicine

## 2012-01-15 ENCOUNTER — Ambulatory Visit (HOSPITAL_BASED_OUTPATIENT_CLINIC_OR_DEPARTMENT_OTHER)
Admission: RE | Admit: 2012-01-15 | Discharge: 2012-01-15 | Disposition: A | Discharge status: Home / Self Care | Payer: Medicare PPO | Source: Ambulatory Visit | Attending: Internal Medicine | Admitting: Internal Medicine

## 2012-01-15 ENCOUNTER — Encounter: Payer: Self-pay | Admitting: Internal Medicine

## 2012-01-15 ENCOUNTER — Ambulatory Visit (INDEPENDENT_AMBULATORY_CARE_PROVIDER_SITE_OTHER): Payer: Medicare PPO | Admitting: Internal Medicine

## 2012-01-15 VITALS — BP 118/84 | HR 81 | Temp 98.0°F | Resp 16 | Wt 229.8 lb

## 2012-01-15 DIAGNOSIS — R079 Chest pain, unspecified: Secondary | ICD-10-CM | POA: Insufficient documentation

## 2012-01-15 DIAGNOSIS — W19XXXA Unspecified fall, initial encounter: Secondary | ICD-10-CM | POA: Insufficient documentation

## 2012-01-15 DIAGNOSIS — R0781 Pleurodynia: Secondary | ICD-10-CM

## 2012-01-15 MED ORDER — TRAMADOL HCL 50 MG PO TABS: 50.0000 mg | ORAL_TABLET | Freq: Three times a day (TID) | ORAL | Status: AC | PRN

## 2012-01-20 DIAGNOSIS — R0781 Pleurodynia: Secondary | ICD-10-CM | POA: Insufficient documentation

## 2012-01-20 DIAGNOSIS — R0789 Other chest pain: Secondary | ICD-10-CM | POA: Insufficient documentation

## 2012-01-20 NOTE — Assessment & Plan Note (Signed)
Obtain right rib series x-ray. Attempt Ultram when necessary pain. Followup if no improvement or worsening.

## 2012-01-20 NOTE — Progress Notes (Signed)
  Subjective:    Patient ID: Mark [name], male    DOB: 05-Sep-1940, 71 y.o.   MRN: [id number]  HPI patient presents to clinic for evaluation of fall. Last week fell striking his right lower rib/chest wall area against the sofa. Has had persistent pain without shortness of breath. Using over-the-counter medication for pain. Pain worsened by inspiration or movement. No other alleviating or exacerbating factors. No other complaints.  Past Medical History  Diagnosis Date  . Hypertension   . Hyperlipidemia   . CAD (coronary artery disease)   . History of nephrolithiasis   . GERD (gastroesophageal reflux disease)   . Depression   . History of skin cancer   . History of pneumothorax   . AAA (abdominal aortic aneurysm)     9.1 cm repair 05/2009   Past Surgical History  Procedure Date  . Cardiac catheterization 06/2004    reports that he quit smoking about 31 years ago. He has never used smokeless tobacco. He reports that he drinks about 1.5 ounces of alcohol per week. He reports that he does not use illicit drugs. family history includes Anuerysm in his father; Coronary artery disease in an unspecified family member; Hyperlipidemia in an unspecified family member; Hypertension in an unspecified family member; Prostate cancer in an unspecified family member; and Pulmonary embolism in his mother.  There is no history of Other and Colon cancer. Allergies  Allergen Reactions  . Ace Inhibitors     REACTION: Cough     Review of Systems see history of present illness     Objective:   Physical Exam  Nursing note and vitals reviewed. Constitutional: He appears well-developed and well-nourished. No distress.  HENT:  Head: Normocephalic and atraumatic.  Pulmonary/Chest: Effort normal and breath sounds normal. No respiratory distress. He has no wheezes. He has no rales.  Musculoskeletal:       Tenderness along her right anterior costal margin. No bony abnormality. Mild bruising noted  Skin:  He is not diaphoretic.          Assessment & Plan:

## 2012-02-09 ENCOUNTER — Ambulatory Visit: Payer: Medicare PPO | Admitting: Internal Medicine

## 2012-03-25 ENCOUNTER — Telehealth: Payer: Self-pay | Admitting: Internal Medicine

## 2012-03-25 MED ORDER — RANITIDINE HCL 150 MG PO TABS: 150.0000 mg | ORAL_TABLET | Freq: Two times a day (BID) | ORAL | Status: DC

## 2012-03-25 NOTE — Telephone Encounter (Signed)
Rx to pharmacy/SLS 

## 2012-03-25 NOTE — Telephone Encounter (Signed)
Refill- ranitidine 150mg  tab. Take one tablet twice daily. Qty 180

## 2012-03-28 ENCOUNTER — Telehealth: Payer: Self-pay | Admitting: *Deleted

## 2012-03-28 MED ORDER — ISOSORBIDE DINITRATE 20 MG PO TABS: 20.0000 mg | ORAL_TABLET | Freq: Three times a day (TID) | ORAL | Status: DC

## 2012-03-28 MED ORDER — METOPROLOL TARTRATE 100 MG PO TABS: 100.0000 mg | ORAL_TABLET | Freq: Two times a day (BID) | ORAL | Status: DC

## 2012-03-28 NOTE — Telephone Encounter (Signed)
Pt called reporting that there is an issue in "communication with [facility], that [facility] 'has the wrong numbers[s]'"? And that he finds it "absolutely horrid in his opinion to refuse to refill medication d/t non-compliance with OV & that he may have ot find new PCP, even though he does like Dr. Rodena Medin; he is not going to put up with '[facility]'".  Could not find any instance with Dr. Rodena Medin as PCP where medication was not filled for any reason; [last notation regarding this would have been on 06.21.12 with previous PCP] listed in EMR. Pt had refills to [facility] 07.13.12, 11.07.12 Reinaldo Raddle inquiry as to dosing of medications, still w/previous provider]. Pt has refills to [facility] with Dr. Rodena Medin as PCP 05.16.13 & 11.18.13 Dannielle Karvonen for Ranitidine only]. Reviewed pt's last refills & confirmed due for Metoprolol & Isosorbide refills [from last 05.16.13 to mail pharmacy] and sent those to [facility] as well; Maxide last filled 08.22.13 #45 x 1 refill [should not need until Feb 2014]/SLS

## 2012-04-17 ENCOUNTER — Telehealth: Payer: Self-pay | Admitting: Internal Medicine

## 2012-04-17 MED ORDER — LOSARTAN POTASSIUM 50 MG PO TABS: 50.0000 mg | ORAL_TABLET | Freq: Every day | ORAL | Status: DC

## 2012-04-17 NOTE — Telephone Encounter (Signed)
Rx sent 

## 2012-04-17 NOTE — Telephone Encounter (Signed)
Refill- losartan 50mg  tab. Take one tablet every day. Qty 90

## 2012-04-22 ENCOUNTER — Other Ambulatory Visit: Payer: Self-pay | Admitting: *Deleted

## 2012-04-22 MED ORDER — AMLODIPINE BESYLATE 10 MG PO TABS: ORAL_TABLET | ORAL | Status: DC

## 2012-04-22 NOTE — Telephone Encounter (Signed)
Pt called stating that there is "a communication problem between office & [facility]"; requesting refill Amlodipine, no request received from [facility] [or any other pharmacy]-Last Rx in EMR 11.07.12 #45x1]; Rx to pharmacy/SLS

## 2012-04-25 ENCOUNTER — Encounter: Payer: Self-pay | Admitting: Vascular Surgery

## 2012-06-19 ENCOUNTER — Other Ambulatory Visit: Payer: Self-pay | Admitting: Internal Medicine

## 2012-06-22 ENCOUNTER — Other Ambulatory Visit: Payer: Self-pay

## 2012-06-24 NOTE — Telephone Encounter (Signed)
Refills sent on losartan. Pt is due for follow up. Please call pt to arrange appt.

## 2012-06-25 NOTE — Telephone Encounter (Signed)
Informed patient of medication refill and he scheduled an appointment in march/2014

## 2012-06-26 ENCOUNTER — Ambulatory Visit (INDEPENDENT_AMBULATORY_CARE_PROVIDER_SITE_OTHER): Payer: Medicare PPO

## 2012-06-26 DIAGNOSIS — Z23 Encounter for immunization: Secondary | ICD-10-CM

## 2012-07-25 ENCOUNTER — Encounter: Payer: Self-pay | Admitting: Family

## 2012-07-25 ENCOUNTER — Ambulatory Visit (INDEPENDENT_AMBULATORY_CARE_PROVIDER_SITE_OTHER): Payer: Medicare PPO | Admitting: Family

## 2012-07-25 VITALS — BP 126/82 | HR 79 | Temp 97.9°F | Resp 16 | Ht 71.5 in | Wt 227.1 lb

## 2012-07-25 DIAGNOSIS — N289 Disorder of kidney and ureter, unspecified: Secondary | ICD-10-CM

## 2012-07-25 DIAGNOSIS — Z85828 Personal history of other malignant neoplasm of skin: Secondary | ICD-10-CM

## 2012-07-25 DIAGNOSIS — Z125 Encounter for screening for malignant neoplasm of prostate: Secondary | ICD-10-CM

## 2012-07-25 DIAGNOSIS — Z13820 Encounter for screening for osteoporosis: Secondary | ICD-10-CM

## 2012-07-25 DIAGNOSIS — F329 Major depressive disorder, single episode, unspecified: Secondary | ICD-10-CM

## 2012-07-25 DIAGNOSIS — I482 Chronic atrial fibrillation, unspecified: Secondary | ICD-10-CM | POA: Insufficient documentation

## 2012-07-25 DIAGNOSIS — I2510 Atherosclerotic heart disease of native coronary artery without angina pectoris: Secondary | ICD-10-CM

## 2012-07-25 DIAGNOSIS — Z0000 Encounter for general adult medical examination without abnormal findings: Secondary | ICD-10-CM

## 2012-07-25 DIAGNOSIS — E785 Hyperlipidemia, unspecified: Secondary | ICD-10-CM

## 2012-07-25 DIAGNOSIS — N401 Enlarged prostate with lower urinary tract symptoms: Secondary | ICD-10-CM

## 2012-07-25 DIAGNOSIS — I10 Essential (primary) hypertension: Secondary | ICD-10-CM

## 2012-07-25 DIAGNOSIS — N259 Disorder resulting from impaired renal tubular function, unspecified: Secondary | ICD-10-CM

## 2012-07-25 DIAGNOSIS — I4891 Unspecified atrial fibrillation: Secondary | ICD-10-CM

## 2012-07-25 LAB — LIPID PANEL
HDL: 46 mg/dL (ref >39)
LDL Cholesterol: 89 mg/dL (ref 0–99)
Total CHOL/HDL Ratio: 3.2 Ratio
Triglycerides: 70 mg/dL (ref <150)
VLDL: 14 mg/dL (ref 0–40)

## 2012-07-25 LAB — HEPATIC FUNCTION PANEL
Albumin: 4.3 g/dL (ref 3.5–5.2)
Total Bilirubin: 0.8 mg/dL (ref 0.3–1.2)
Total Protein: 6.9 g/dL (ref 6.0–8.3)

## 2012-07-25 LAB — BASIC METABOLIC PANEL WITH GFR
BUN: 34 mg/dL — ABNORMAL HIGH (ref 6–23)
CO2: 28 mEq/L (ref 19–32)
Chloride: 103 mEq/L (ref 96–112)
GFR, Est African American: 55 mL/min — ABNORMAL LOW
Glucose, Bld: 90 mg/dL (ref 70–99)
Potassium: 3.9 mEq/L (ref 3.5–5.3)

## 2012-07-25 LAB — PSA: PSA: 2.13 ng/mL (ref <=4.00)

## 2012-07-25 MED ORDER — RIVAROXABAN 20 MG PO TABS: 20.0000 mg | ORAL_TABLET | Freq: Every day | ORAL | Status: DC

## 2012-07-25 NOTE — Assessment & Plan Note (Signed)
Stable on Zoloft. Continue same. 

## 2012-07-25 NOTE — Assessment & Plan Note (Signed)
Tolerating statin, obtain flp/lft.  

## 2012-07-25 NOTE — Assessment & Plan Note (Signed)
Obtain BMET. 

## 2012-07-25 NOTE — Assessment & Plan Note (Signed)
New.  Noted on EKG today. Rate is stable. Will initiate xarelto and refer back to Dr. Jens Som.  We discussed risk of stroke with AF as well as risk of bleed on xarelto.  I believe that the benefits outweigh the risks.

## 2012-07-25 NOTE — Assessment & Plan Note (Signed)
BP Readings from Last 3 Encounters:  07/25/12 126/82  01/15/12 118/84  11/08/11 140/88   BP is stable.  Continue current meds.

## 2012-07-25 NOTE — Assessment & Plan Note (Signed)
Clinically stable off of meds.  Monitor.

## 2012-07-25 NOTE — Progress Notes (Signed)
Subjective:    Patient ID: Mark [name], male    DOB: 1940/05/12, 72 y.o.   MRN: [id number]  HPI  Subjective:   Patient here for Medicare annual wellness visit and management of other chronic and acute problems.  HTN- on losartan and amlodipine.  Denies SOB.  Mild LE edema.    Hyperlipidemia- on simvastatin, tolerating without myalgia.   Renal insufficiency-  Last Cr 1.37  CAD- Pt is maintained on metoprolol, statin, asprin and isosorbide. He is s/p cardiac cath in 2006.  Per pt the LAD had angioplasty.  He has worked with Dr. Jens Som.    GERD-On Zantac.  Hx Skin Cancer- basal cell carcinoma (nose) >10 yrs ago. Has a dermatologist who he sees.    Depression- on zoloft.   Reports that he is feeling well.    BPH- Not currently on medication for this.  He has some nocturia,  2-4 times.  Reports good stream and sense of bladder emptying.   AAA- has hx of stent.  He is followed with Dr. Myra Gianotti. He will arrange follow up.    Preventative- last colo 2004, Tetanus is up to date, pneumovax up to date.  Flu up to date.  He has not had zostavax.  Declines colo.   Risk factors: at risk for CAD complications and Skin cancer.    Roster of Physicians Providing Medical Care to Patient: Activities of Daily Living  In your present state of health, do you have any difficulty performing the following activities? Preparing food and eating?: No  Bathing yourself: No  Getting dressed: No  Using the toilet:No  Moving around from place to place: No  In the past year have you fallen or had a near fall?:Yes- reports 6 months ago, tripped on a cord.    Home Safety: Has smoke detector and wears seat belts. He has one gun in the house- no ammunition. No excess sun exposure.  Diet and Exercise  Current exercise habits: regular weight training Dietary issues discussed: healthy diet   Depression Screen  (Note: if answer to either of the following is "Yes", then a more complete depression  screening is indicated)  Q1: Over the past two weeks, have you felt down, depressed or hopeless?no  Q2: Over the past two weeks, have you felt little interest or pleasure in doing things? no   The following portions of the patient's history were reviewed and updated as appropriate: allergies, current medications, past family history, past medical history, past social history, past surgical history and problem list.   Past Medical History  Diagnosis Date  . Hypertension   . Hyperlipidemia   . CAD (coronary artery disease)   . History of nephrolithiasis   . GERD (gastroesophageal reflux disease)   . Depression   . History of skin cancer   . History of pneumothorax   . AAA (abdominal aortic aneurysm)     9.1 cm repair 05/2009    History   Social History  . Marital Status: Divorced    Spouse Name: N/A    Number of Children: N/A  . Years of Education: N/A   Occupational History  . Not on file.   Social History Main Topics  . Smoking status: Former Smoker    Quit date: 08/24/1980  . Smokeless tobacco: Never Used     Comment: quit   . Alcohol Use: 1.5 oz/week    3 drink(s) per week  . Drug Use: No  . Sexually Active: Not on file  Other Topics Concern  . Not on file   Social History Narrative   Retired   Divorced   2 sons 34 &30     Occupation:  Part time sports broadcaster    Smoking Status:  quit (08/24/1980)   Packs/Day:  1.0    Caffeine use/day:  3 beverages daily    Does Patient Exercise:  no   Alcohol Use - yes    Past Surgical History  Procedure Laterality Date  . Cardiac catheterization  06/2004    Family History  Problem Relation Age of Onset  . Coronary artery disease    . Hyperlipidemia    . Hypertension    . Pulmonary embolism Mother     died of PE  . Prostate cancer    . Anuerysm Father     history of popliteal  . Other Neg Hx     polycystic kidney disease, and colon cancer  . Colon cancer Neg Hx     Allergies  Allergen Reactions  .  Ace Inhibitors     REACTION: Cough    Current Outpatient Prescriptions on File Prior to Visit  Medication Sig Dispense Refill  . amLODipine (NORVASC) 10 MG tablet 1/2 tablet by mouth once a day  45 tablet  1  . aspirin 81 MG tablet Take 81 mg by mouth 4 (four) times daily.        . Coenzyme Q10 (COQ-10) 100 MG CAPS Take 200 mg by mouth daily.       . isosorbide dinitrate (ISORDIL) 20 MG tablet Take 1 tablet (20 mg total) by mouth 3 (three) times daily.  270 tablet  1  . losartan (COZAAR) 50 MG tablet TAKE 1 TABLET EVERY DAY  90 tablet  1  . metoprolol (LOPRESSOR) 100 MG tablet Take 1 tablet (100 mg total) by mouth 2 (two) times daily.  180 tablet  1  . Multiple Vitamin (MULTIVITAMIN) tablet Take 1 tablet by mouth daily.        . Nutritional Supplements (MELATONIN PO) Take by mouth daily as needed.       . Omega-3 Fatty Acids (FISH OIL) 1200 MG CAPS Take 1,200 mg by mouth 2 (two) times daily.       . ranitidine (ZANTAC) 150 MG tablet Take 1 tablet (150 mg total) by mouth 2 (two) times daily.  180 tablet  1  . sertraline (ZOLOFT) 100 MG tablet Take 1 tablet (100 mg total) by mouth daily.  90 tablet  3  . simvastatin (ZOCOR) 20 MG tablet Take 1 tablet (20 mg total) by mouth at bedtime.  90 tablet  1  . triamterene-hydrochlorothiazide (MAXZIDE-25) 37.5-25 MG per tablet TAKE 1/2 TABLET EVERY MORNING  45 tablet  1  . DiphenhydrAMINE HCl, Sleep, (SOMINEX PO) Take 25 mg by mouth 3 times/day as needed-between meals & bedtime.       . vitamin C (ASCORBIC ACID) 500 MG tablet Take 500 mg by mouth daily.         No current facility-administered medications on file prior to visit.    BP 126/82  Pulse 79  Temp(Src) 97.9 F (36.6 C) (Oral)  Resp 16  Ht 5' 11.5" (1.816 m)  Wt 227 lb 1.3 oz (103.003 kg)  BMI 31.23 kg/m2  SpO2 97%    Objective:   Vision: see nursing Hearing: able to hear forced whisper at 6 feet. Body mass index: see HPI Cognitive Impairment Assessment: cognition, memory and  judgment appear normal.   Assessment:  Medicare wellness utd on preventive parameters   Plan:   During the course of the visit the patient was educated and counseled about appropriate screening and preventive services including:       Fall prevention   Bone densitometry screening  Diabetes screening  Nutrition counseling   Vaccines / LABS He will consider zostavax and let us know if he wishes to proceed.  Fasting labs as below.   Patient Instructions (the written plan) was given to the patient.       Review of Systems     Objective:   Physical Exam  Constitutional: He is oriented to person, place, and time. He appears well-developed and well-nourished. No distress.  HENT:  Head: Normocephalic and atraumatic.  Mouth/Throat: No oropharyngeal exudate.  Cardiovascular: Normal rate.  An irregular rhythm present.  No murmur heard. Pulmonary/Chest: Effort normal and breath sounds normal.  Abdominal: Soft. Bowel sounds are normal.  Genitourinary:  Prostate 1+, no nodules or asymetry noted. Stool heme neg.  Musculoskeletal: He exhibits no edema.  Lymphadenopathy:    He has no cervical adenopathy.  Neurological: He is alert and oriented to person, place, and time.  Skin: Skin is warm and dry. No rash noted. No erythema. No pallor.  Psychiatric: He has a normal mood and affect. His behavior is normal. Judgment and thought content normal.          Assessment & Plan:

## 2012-07-25 NOTE — Assessment & Plan Note (Signed)
Recommended follow up with derm.  Pt wishes to arrange.

## 2012-07-25 NOTE — Patient Instructions (Addendum)
Complete your lab work prior to leaving.  Please schedule a follow up appointment with Dr. Jens Som, Dermatology and Dr. Myra Gianotti. You will be contacted about your bone density test. Follow up in 3 months.   Atrial Fibrillation Your caregiver has diagnosed you with atrial fibrillation (AFib). The heart normally beats very regularly; AFib is a type of irregular heartbeat. The heart rate may be faster or slower than normal. This can prevent your heart from pumping as well as it should. AFib can be constant (chronic) or intermittent (paroxysmal). CAUSES  Atrial fibrillation may be caused by:  Heart disease, including heart attack, coronary artery disease, heart failure, diseases of the heart valves, and others.  Blood clot in the lungs (pulmonary embolism).  Pneumonia or other infections.  Chronic lung disease.  Thyroid disease.  Toxins. These include alcohol, some medications (such as decongestant medications or diet pills), and caffeine. In some people, no cause for AFib can be found. This is referred to as Lone Atrial Fibrillation. SYMPTOMS   Palpitations or a fluttering in your chest.  A vague sense of chest discomfort.  Shortness of breath.  Sudden onset of lightheadedness or weakness. Sometimes, the first sign of AFib can be a complication of the condition. This could be a stroke or heart failure. DIAGNOSIS  Your description of your condition may make your caregiver suspicious of atrial fibrillation. Your caregiver will examine your pulse to determine if fibrillation is present. An EKG (electrocardiogram) will confirm the diagnosis. Further testing may help determine what caused you to have atrial fibrillation. This may include chest x-ray, echocardiogram, blood tests, or CT scans. PREVENTION  If you have previously had atrial fibrillation, your caregiver may advise you to avoid substances known to cause the condition (such as stimulant medications, and possibly caffeine or  alcohol). You may be advised to use medications to prevent recurrence. Proper treatment of any underlying condition is important to help prevent recurrence. PROGNOSIS  Atrial fibrillation does tend to become a chronic condition over time. It can cause significant complications (see below). Atrial fibrillation is not usually immediately life-threatening, but it can shorten your life expectancy. This seems to be worse in women. If you have lone atrial fibrillation and are under 42 years old, the risk of complications is very low, and life expectancy is not shortened. RISKS AND COMPLICATIONS  Complications of atrial fibrillation can include stroke, chest pain, and heart failure. Your caregiver will recommend treatments for the atrial fibrillation, as well as for any underlying conditions, to help minimize risk of complications. TREATMENT  Treatment for AFib is divided into several categories:  Treatment of any underlying condition.  Converting you out of AFib into a regular (sinus) rhythm.  Controlling rapid heart rate.  Prevention of blood clots and stroke. Medications and procedures are available to convert your atrial fibrillation to sinus rhythm. However, recent studies have shown that this may not offer you any advantage, and cardiac experts are continuing research and debate on this topic. More important is controlling your rapid heartbeat. The rapid heartbeat causes more symptoms, and places strain on your heart. Your caregiver will advise you on the use of medications that can control your heart rate. Atrial fibrillation is a strong stroke risk. You can lessen this risk by taking blood thinning medications such as Coumadin (warfarin), or sometimes aspirin. These medications need close monitoring by your caregiver. Over-medication can cause bleeding. Too little medication may not protect against stroke. HOME CARE INSTRUCTIONS   If your caregiver prescribed medicine  to make your heartbeat more  normally, take as directed.  If blood thinners were prescribed by your caregiver, take EXACTLY as directed.  Perform blood tests EXACTLY as directed.  Quit smoking. Smoking increases your cardiac and lung (pulmonary) risks.  DO NOT drink alcohol.  DO NOT drink caffeinated drinks (e.g. coffee, soda, chocolate, and leaf teas). You may drink decaffeinated coffee, soda or tea.  If you are overweight, you should choose a reduced calorie diet to lose weight. Please see a registered dietitian if you need more information about healthy weight loss. DO NOT USE DIET PILLS as they may aggravate heart problems.  If you have other heart problems that are causing AFib, you may need to eat a low salt, fat, and cholesterol diet. Your caregiver will tell you if this is necessary.  Exercise every day to improve your physical fitness. Stay active unless advised otherwise.  If your caregiver has given you a follow-up appointment, it is very important to keep that appointment. Not keeping the appointment could result in heart failure or stroke. If there is any problem keeping the appointment, you must call back to this facility for assistance. SEEK MEDICAL CARE IF:  You notice a change in the rate, rhythm or strength of your heartbeat.  You develop an infection or any other change in your overall health status. SEEK IMMEDIATE MEDICAL CARE IF:   You develop chest pain, abdominal pain, sweating, weakness or feel sick to your stomach (nausea).  You develop shortness of breath.  You develop swollen feet and ankles.  You develop dizziness, numbness, or weakness of your face or limbs, or any change in vision or speech. MAKE SURE YOU:   Understand these instructions.  Will watch your condition.  Will get help right away if you are not doing well or get worse. Document Released: 04/24/2005 Document Revised: 07/17/2011 Document Reviewed: 11/27/2007 [facility] Patient Information 2013 [place], [place].

## 2012-08-14 ENCOUNTER — Other Ambulatory Visit: Payer: Self-pay | Admitting: Internal Medicine

## 2012-08-14 NOTE — Telephone Encounter (Signed)
Rx request to pharmacy/SLS  

## 2012-08-16 ENCOUNTER — Other Ambulatory Visit: Payer: Self-pay | Admitting: *Deleted

## 2012-08-16 MED ORDER — RIVAROXABAN 20 MG PO TABS: 20.0000 mg | ORAL_TABLET | Freq: Every day | ORAL | Status: DC

## 2012-08-20 ENCOUNTER — Telehealth: Payer: Self-pay | Admitting: Internal Medicine

## 2012-08-20 ENCOUNTER — Other Ambulatory Visit: Payer: Self-pay

## 2012-08-20 MED ORDER — SIMVASTATIN 20 MG PO TABS: 20.0000 mg | ORAL_TABLET | Freq: Every day | ORAL | Status: DC

## 2012-08-20 MED ORDER — SIMVASTATIN 20 MG PO TABS
20.0000 mg | ORAL_TABLET | Freq: Every day | ORAL | Status: DC
Start: 2012-08-20 — End: 2012-10-30

## 2012-08-20 NOTE — Telephone Encounter (Signed)
RX sent

## 2012-08-20 NOTE — Telephone Encounter (Signed)
SIMVASTATIN 20 MG QTY 90

## 2012-08-28 ENCOUNTER — Ambulatory Visit: Payer: Medicare PPO | Admitting: Cardiology

## 2012-10-22 ENCOUNTER — Telehealth: Payer: Self-pay | Admitting: *Deleted

## 2012-10-22 ENCOUNTER — Other Ambulatory Visit: Payer: Self-pay | Admitting: Internal Medicine

## 2012-10-22 MED ORDER — AMLODIPINE BESYLATE 10 MG PO TABS: ORAL_TABLET | ORAL | Status: DC

## 2012-10-22 MED ORDER — TRIAMTERENE-HCTZ 37.5-25 MG PO TABS: ORAL_TABLET | ORAL | Status: DC

## 2012-10-22 NOTE — Telephone Encounter (Signed)
Left msg on vm twice. Pt states pharmacy states they sent request on meds. Haven't received response bck from office. Requesting call back ASAP. Called pt back inform him office hasn't received request from [facility]. Inform pt will send refill and also will send 2 week supply to [facility] since he is out of meds...Raechel Chute

## 2012-10-22 NOTE — Telephone Encounter (Signed)
Escript request for SERTRALINE Last Rx: 07.26.13, #90x3 Last OV: 03.20.14 Return: 3-Months Rx to pharmacy per [facility] refill protocol/SLS

## 2012-10-30 ENCOUNTER — Encounter: Payer: Self-pay | Admitting: Cardiology

## 2012-10-30 ENCOUNTER — Ambulatory Visit (INDEPENDENT_AMBULATORY_CARE_PROVIDER_SITE_OTHER): Payer: Medicare PPO | Admitting: Family

## 2012-10-30 ENCOUNTER — Encounter: Payer: Self-pay | Admitting: Family

## 2012-10-30 ENCOUNTER — Ambulatory Visit (INDEPENDENT_AMBULATORY_CARE_PROVIDER_SITE_OTHER): Payer: Medicare PPO | Admitting: Cardiology

## 2012-10-30 VITALS — BP 128/80 | HR 81 | Wt 233.0 lb

## 2012-10-30 VITALS — HR 80 | Temp 97.6°F | Resp 16 | Ht 71.5 in | Wt 233.0 lb

## 2012-10-30 DIAGNOSIS — I10 Essential (primary) hypertension: Secondary | ICD-10-CM

## 2012-10-30 DIAGNOSIS — I4891 Unspecified atrial fibrillation: Secondary | ICD-10-CM

## 2012-10-30 DIAGNOSIS — N259 Disorder resulting from impaired renal tubular function, unspecified: Secondary | ICD-10-CM

## 2012-10-30 DIAGNOSIS — E785 Hyperlipidemia, unspecified: Secondary | ICD-10-CM

## 2012-10-30 MED ORDER — PRAVASTATIN SODIUM 40 MG PO TABS: 40.0000 mg | ORAL_TABLET | Freq: Every evening | ORAL | Status: DC

## 2012-10-30 MED ORDER — APIXABAN 5 MG PO TABS: 5.0000 mg | ORAL_TABLET | Freq: Two times a day (BID) | ORAL | Status: DC

## 2012-10-30 NOTE — Patient Instructions (Addendum)
Please schedule a follow up appointment in 6 months.   

## 2012-10-30 NOTE — Patient Instructions (Addendum)
Your physician wants you to follow-up in: 6 MONTHS WITH DR Jens Som You will receive a reminder letter in the mail two months in advance. If you don't receive a letter, please call our office to schedule the follow-up appointment.   STOP ASPIRIN  STOP SIMVASTATIN  START PRAVASTATIN 40 MG ONCE DAILY AT BEDTIME  STOP XARELTO  START ELIQUIS 5 MG ONE TABLET TWICE DAILY  Your physician recommends that you return for lab work in: 4 WEEKS [place]  Your physician has requested that you have an echocardiogram. Echocardiography is a painless test that uses sound waves to create images of your heart. It provides your doctor with information about the size and shape of your heart and how well your heart's chambers and valves are working. This procedure takes approximately one hour. There are no restrictions for this procedure.   Your physician has requested that you have a lexiscan myoview. For further information please visit [email or web address] Please follow instruction sheet, as given.

## 2012-10-30 NOTE — Assessment & Plan Note (Signed)
Blood pressure controlled. Continue present medications. 

## 2012-10-30 NOTE — Assessment & Plan Note (Signed)
The patient has atrial fibrillation of unknown duration. He is asymptomatic. Continue metoprolol for rate control. Embolic risk factors of age greater than 59 and hypertension. He has renal insufficiency on previous lab work. Discontinue xeralto and begin apixaban 5 mg po BID. Check potassium, renal function and hemoglobin in 4 weeks. We discussed rate control versus rhythm control. Given that he is asymptomatic we will plan rate control and anticoagulation. Check echocardiogram and TSH.

## 2012-10-30 NOTE — Assessment & Plan Note (Signed)
Management per primary care. 

## 2012-10-30 NOTE — Progress Notes (Signed)
Subjective:    Patient ID: Mark [name], male    DOB: 1940/08/01, 72 y.o.   MRN: [id number]  HPI  Mark [name] is a 72 yr old male who presents today for follow up.  1) Renal Insufficiency- last creatine was 1.49.  2) HTN- BP stable.  Amlodipine, lopressor, maxide.  3) Hyperlipidemia- cardiology d/cd simvastatin and started pravastatin.    Review of Systems See HPI  Past Medical History  Diagnosis Date  . Hypertension   . Hyperlipidemia   . CAD (coronary artery disease)   . History of nephrolithiasis   . GERD (gastroesophageal reflux disease)   . Depression   . History of skin cancer   . History of pneumothorax   . AAA (abdominal aortic aneurysm)     9.1 cm repair 05/2009  . Atrial fibrillation     History   Social History  . Marital Status: Divorced    Spouse Name: N/A    Number of Children: 2  . Years of Education: N/A   Occupational History  .     Social History Main Topics  . Smoking status: Former Smoker    Quit date: 08/24/1980  . Smokeless tobacco: Never Used     Comment: quit   . Alcohol Use: 1.5 oz/week    3 drink(s) per week     Comment: Occasional  . Drug Use: No  . Sexually Active: Not on file   Other Topics Concern  . Not on file   Social History Narrative   Retired   Divorced   2 sons 20 &30     Occupation:  Part time sports broadcaster    Smoking Status:  quit (08/24/1980)   Packs/Day:  1.0    Caffeine use/day:  3 beverages daily    Does Patient Exercise:  no   Alcohol Use - yes    Past Surgical History  Procedure Laterality Date  . Cardiac catheterization  06/2004  . Abdominal aortic aneurysm repair    . Tonsillectomy    . Athroscopic knee surgery      Family History  Problem Relation Age of Onset  . Coronary artery disease    . Hyperlipidemia    . Hypertension    . Pulmonary embolism Mother     died of PE  . Prostate cancer    . Anuerysm Father     history of popliteal  . Other Neg Hx     polycystic kidney  disease, and colon cancer  . Colon cancer Neg Hx     Allergies  Allergen Reactions  . Ace Inhibitors     REACTION: Cough    Current Outpatient Prescriptions on File Prior to Visit  Medication Sig Dispense Refill  . amLODipine (NORVASC) 10 MG tablet 1/2 tablet by mouth once a day  10 tablet  0  . Coenzyme Q10 (COQ-10) 100 MG CAPS Take 200 mg by mouth daily.       . isosorbide dinitrate (ISORDIL) 20 MG tablet TAKE 1 TABLET THREE TIMES DAILY  270 tablet  1  . losartan (COZAAR) 50 MG tablet TAKE 1 TABLET EVERY DAY  90 tablet  1  . metoprolol (LOPRESSOR) 100 MG tablet TAKE 1 TABLET TWICE DAILY  180 tablet  1  . Multiple Vitamin (MULTIVITAMIN) tablet Take 1 tablet by mouth daily.        . Nutritional Supplements (MELATONIN PO) Take by mouth daily as needed.       . Omega-3 Fatty Acids (FISH OIL)  1200 MG CAPS Take 1,200 mg by mouth 2 (two) times daily.       . ranitidine (ZANTAC) 150 MG tablet TAKE 1 TABLET TWICE DAILY  180 tablet  1  . sertraline (ZOLOFT) 100 MG tablet TAKE 1 TABLET EVERY DAY  90 tablet  1  . triamterene-hydrochlorothiazide (MAXZIDE-25) 37.5-25 MG per tablet TAKE 1/2 TABLET EVERY MORNING  10 tablet  0   No current facility-administered medications on file prior to visit.    Pulse 80  Temp(Src) 97.6 F (36.4 C) (Oral)  Resp 16  Ht 5' 11.5" (1.816 m)  Wt 233 lb (105.688 kg)  BMI 32.05 kg/m2  SpO2 96%       Objective:   Physical Exam  Constitutional: He is oriented to person, place, and time. He appears well-developed and well-nourished. No distress.  Cardiovascular: Normal rate and regular rhythm.   No murmur heard. Pulmonary/Chest: Effort normal and breath sounds normal. No respiratory distress. He has no wheezes. He has no rales. He exhibits no tenderness.  Neurological: He is alert and oriented to person, place, and time.  Skin: Skin is warm and dry.  Psychiatric: He has a normal mood and affect. His behavior is normal. Judgment and thought content normal.           Assessment & Plan:   BP Readings from Last 3 Encounters:  10/30/12 128/80  07/25/12 126/82  01/15/12 118/84

## 2012-10-30 NOTE — Assessment & Plan Note (Signed)
Given the potential interaction with Norvasc I will discontinue Zocor. Instead I will treat with Pravachol 40 mg daily. Check lipids and liver in 6 weeks.

## 2012-10-30 NOTE — Progress Notes (Signed)
HPI: 72 yo male with past medical history of coronary disease, hypertension and hyperlipidemia presents for establishment. Patient was seen in this office previously but not since July of 2010. The patient's cardiac history dates back to 2006 when he had PCI of his LAD and first diagonal. Note his LV function was normal. He also had a subtotal of his PDA. Myoview in July of 2010 showed and EF of 64 and inferior ischemia; medial therapy recommended. Patient had repair of AAA in Jan 2011. Patient also noted to have atrial fibrillation on ECG in March of 2014. Recent laboratories in March of 2014 showed a BUN of 34 and creatinine of 1.49. Patient has mild dyspnea on exertion which is chronic and unchanged. No orthopnea, PND, pedal edema, exertional chest pain, palpitations or bleeding.  Current Outpatient Prescriptions  Medication Sig Dispense Refill  . amLODipine (NORVASC) 10 MG tablet 1/2 tablet by mouth once a day  10 tablet  0  . aspirin 81 MG tablet Take 81 mg by mouth 4 (four) times daily.        . Coenzyme Q10 (COQ-10) 100 MG CAPS Take 200 mg by mouth daily.       . isosorbide dinitrate (ISORDIL) 20 MG tablet TAKE 1 TABLET THREE TIMES DAILY  270 tablet  1  . losartan (COZAAR) 50 MG tablet TAKE 1 TABLET EVERY DAY  90 tablet  1  . metoprolol (LOPRESSOR) 100 MG tablet TAKE 1 TABLET TWICE DAILY  180 tablet  1  . Multiple Vitamin (MULTIVITAMIN) tablet Take 1 tablet by mouth daily.        . Nutritional Supplements (MELATONIN PO) Take by mouth daily as needed.       . Omega-3 Fatty Acids (FISH OIL) 1200 MG CAPS Take 1,200 mg by mouth 2 (two) times daily.       . ranitidine (ZANTAC) 150 MG tablet TAKE 1 TABLET TWICE DAILY  180 tablet  1  . Rivaroxaban (XARELTO) 20 MG TABS Take 1 tablet (20 mg total) by mouth daily.  90 tablet  1  . sertraline (ZOLOFT) 100 MG tablet TAKE 1 TABLET EVERY DAY  90 tablet  1  . simvastatin (ZOCOR) 20 MG tablet Take 1 tablet (20 mg total) by mouth at bedtime.  90 tablet  1    . triamterene-hydrochlorothiazide (MAXZIDE-25) 37.5-25 MG per tablet TAKE 1/2 TABLET EVERY MORNING  10 tablet  0   No current facility-administered medications for this visit.    Allergies  Allergen Reactions  . Ace Inhibitors     REACTION: Cough    Past Medical History  Diagnosis Date  . Hypertension   . Hyperlipidemia   . CAD (coronary artery disease)   . History of nephrolithiasis   . GERD (gastroesophageal reflux disease)   . Depression   . History of skin cancer   . History of pneumothorax   . AAA (abdominal aortic aneurysm)     9.1 cm repair 05/2009  . Atrial fibrillation     Past Surgical History  Procedure Laterality Date  . Cardiac catheterization  06/2004  . Abdominal aortic aneurysm repair    . Tonsillectomy    . Athroscopic knee surgery      History   Social History  . Marital Status: Divorced    Spouse Name: N/A    Number of Children: 2  . Years of Education: N/A   Occupational History  .     Social History Main Topics  . Smoking status: Former Smoker  Quit date: 08/24/1980  . Smokeless tobacco: Never Used     Comment: quit   . Alcohol Use: 1.5 oz/week    3 drink(s) per week     Comment: Occasional  . Drug Use: No  . Sexually Active: Not on file   Other Topics Concern  . Not on file   Social History Narrative   Retired   Divorced   2 sons 59 &30     Occupation:  Part time sports Corporate investment banker    Smoking Status:  quit (08/24/1980)   Packs/Day:  1.0    Caffeine use/day:  3 beverages daily    Does Patient Exercise:  no   Alcohol Use - yes    Family History  Problem Relation Age of Onset  . Coronary artery disease    . Hyperlipidemia    . Hypertension    . Pulmonary embolism Mother     died of PE  . Prostate cancer    . Anuerysm Father     history of popliteal  . Other Neg Hx     polycystic kidney disease, and colon cancer  . Colon cancer Neg Hx     ROS: no fevers or chills, productive cough, hemoptysis, dysphasia,  odynophagia, melena, hematochezia, dysuria, hematuria, rash, seizure activity, orthopnea, PND, pedal edema, claudication. Remaining systems are negative.  Physical Exam:   Blood pressure 128/80, pulse 81, weight 233 lb (105.688 kg).  General:  Well developed/well nourished in NAD Skin warm/dry Patient not depressed No peripheral clubbing Back-normal HEENT-normal/normal eyelids Neck supple/normal carotid upstroke bilaterally; no bruits; no JVD; no thyromegaly chest - CTA/ normal expansion CV - irregular/normal S1 and S2; no murmurs, rubs or gallops;  PMI nondisplaced Abdomen -NT/ND, no HSM, no mass, + bowel sounds, no bruit 2+ femoral pulses, no bruits Ext-no edema, chords, 2+ DP Neuro-grossly nonfocal  ECG atrial fibrillation at a rate of 81. Nonspecific ST changes.

## 2012-10-30 NOTE — Assessment & Plan Note (Signed)
Patient instructed to fu with vascular surgery.

## 2012-10-30 NOTE — Assessment & Plan Note (Signed)
Discontinue aspirin given need for anticoagulation. Continue statin. Schedule Myoview for risk stratification.

## 2012-10-31 NOTE — Assessment & Plan Note (Signed)
BP stable on current meds continue same.  

## 2012-10-31 NOTE — Assessment & Plan Note (Addendum)
Cr has been stable. He is ordered for a follow up bmet with cardiology.

## 2012-10-31 NOTE — Assessment & Plan Note (Signed)
Tolerating statin. He will return for flp per cardiology orders.

## 2012-11-04 ENCOUNTER — Ambulatory Visit (HOSPITAL_COMMUNITY): Payer: Medicare PPO | Attending: Cardiology | Admitting: Radiology

## 2012-11-04 ENCOUNTER — Other Ambulatory Visit (HOSPITAL_COMMUNITY): Payer: Self-pay | Admitting: Radiology

## 2012-11-04 DIAGNOSIS — I4891 Unspecified atrial fibrillation: Secondary | ICD-10-CM

## 2012-11-04 DIAGNOSIS — I10 Essential (primary) hypertension: Secondary | ICD-10-CM | POA: Insufficient documentation

## 2012-11-04 DIAGNOSIS — I2510 Atherosclerotic heart disease of native coronary artery without angina pectoris: Secondary | ICD-10-CM | POA: Insufficient documentation

## 2012-11-04 DIAGNOSIS — N289 Disorder of kidney and ureter, unspecified: Secondary | ICD-10-CM | POA: Insufficient documentation

## 2012-11-04 NOTE — Progress Notes (Signed)
Echocardiogram performed.  

## 2012-11-05 ENCOUNTER — Ambulatory Visit (HOSPITAL_COMMUNITY): Payer: Medicare PPO | Attending: Cardiology | Admitting: Radiology

## 2012-11-05 VITALS — BP 142/115 | Ht 71.0 in | Wt 233.0 lb

## 2012-11-05 DIAGNOSIS — I10 Essential (primary) hypertension: Secondary | ICD-10-CM | POA: Insufficient documentation

## 2012-11-05 DIAGNOSIS — R0989 Other specified symptoms and signs involving the circulatory and respiratory systems: Secondary | ICD-10-CM

## 2012-11-05 DIAGNOSIS — Z8249 Family history of ischemic heart disease and other diseases of the circulatory system: Secondary | ICD-10-CM | POA: Insufficient documentation

## 2012-11-05 DIAGNOSIS — Z9861 Coronary angioplasty status: Secondary | ICD-10-CM | POA: Insufficient documentation

## 2012-11-05 DIAGNOSIS — E785 Hyperlipidemia, unspecified: Secondary | ICD-10-CM | POA: Insufficient documentation

## 2012-11-05 DIAGNOSIS — Z87891 Personal history of nicotine dependence: Secondary | ICD-10-CM | POA: Insufficient documentation

## 2012-11-05 DIAGNOSIS — R0609 Other forms of dyspnea: Secondary | ICD-10-CM | POA: Insufficient documentation

## 2012-11-05 DIAGNOSIS — I4891 Unspecified atrial fibrillation: Secondary | ICD-10-CM | POA: Insufficient documentation

## 2012-11-05 DIAGNOSIS — J449 Chronic obstructive pulmonary disease, unspecified: Secondary | ICD-10-CM | POA: Insufficient documentation

## 2012-11-05 DIAGNOSIS — J4489 Other specified chronic obstructive pulmonary disease: Secondary | ICD-10-CM | POA: Insufficient documentation

## 2012-11-05 DIAGNOSIS — I2510 Atherosclerotic heart disease of native coronary artery without angina pectoris: Secondary | ICD-10-CM

## 2012-11-05 MED ORDER — TECHNETIUM TC 99M SESTAMIBI GENERIC - CARDIOLITE
11.0000 | Freq: Once | INTRAVENOUS | Status: AC | PRN
  Administered 2012-11-05: 11 via INTRAVENOUS

## 2012-11-05 MED ORDER — TECHNETIUM TC 99M SESTAMIBI GENERIC - CARDIOLITE
33.0000 | Freq: Once | INTRAVENOUS | Status: AC | PRN
  Administered 2012-11-05: 33 via INTRAVENOUS

## 2012-11-05 MED ORDER — REGADENOSON 0.4 MG/5ML IV SOLN
0.4000 mg | Freq: Once | INTRAVENOUS | Status: AC
  Administered 2012-11-05: 0.4 mg via INTRAVENOUS

## 2012-11-05 NOTE — Progress Notes (Signed)
[facility] SITE 3 NUCLEAR MED [address] [place], [place] [address] [phone]    Cardiology Nuclear Med Study  Mark [name] is a 72 y.o. male     MRN : [id number]     DOB: [birth year]  Procedure Date: 11/05/2012  Nuclear Med Background Indication for Stress Test:  Evaluation for Ischemia and PTCA/Stent Patency History:  COPD and AFIB, 2006 Heart Cath: Stent/PTCA LAD residual Dx Dz, 11/2008 inferior ischemia  EF: 64% low risk, AAA repair 05/2009  Cardiac Risk Factors: Family History - CAD, History of Smoking, Hypertension and Lipids  Symptoms:  DOE   Nuclear Pre-Procedure Caffeine/Decaff Intake:  None > 12 hrs NPO After: 10:00pm   Lungs:  clear O2 Sat: 96% on room air. IV 0.9% NS with Angio Cath:  20g  IV Site: R Antecubital x 1, tolerated well IV Started by:  Irean Hong, RN  Chest Size (in):  44 Cup Size: n/a  Height: 5\' 11"  (1.803 m)  Weight:  233 lb (105.688 kg)  BMI:  Body mass index is 32.51 kg/(m^2). Tech Comments:  Took lopressor this am    Nuclear Med Study 1 or 2 day study: 1 day  Stress Test Type:  Treadmill/Lexiscan  Reading MD: Olga Millers, MD  Order Authorizing Provider:  Olga Millers, MD  Resting Radionuclide: Technetium 3m Sestamibi  Resting Radionuclide Dose: 11.0 mCi   Stress Radionuclide:  Technetium 25m Sestamibi  Stress Radionuclide Dose: 33.0 mCi           Stress Protocol Rest HR: 75 Stress HR: 92  Rest BP: 142/115 Stress BP: 161/108  Exercise Time (min): n/a METS: n/a   Predicted Max HR: 151 bpm % Max HR: 60.93 bpm Rate Pressure Product: 21308   Dose of Adenosine (mg):  n/a Dose of Lexiscan: 0.4 mg  Dose of Atropine (mg): n/a Dose of Dobutamine: n/a mcg/kg/min (at max HR)  Stress Test Technologist: Milana Na, EMT-P  Nuclear Technologist:  Domenic Polite, CNMT     Rest Procedure:  Myocardial perfusion imaging was performed at rest 45 minutes following the intravenous administration of Technetium 37m  Sestamibi. Rest ECG: Atrial fibrillation, nonspecific ST changes.  Stress Procedure:  The patient received IV Lexiscan 0.4 mg over 15-seconds with concurrent low level exercise and then Technetium 30m Sestamibi was injected at 30-seconds while the patient continued walking one more minute. This patient had sob, and fatigue with the Lexiscan injection. Quantitative spect images were obtained after a 45-minute delay. Stress ECG: No significant ST segment change suggestive of ischemia.  QPS Raw Data Images:  Acquisition technically good; normal left ventricular size. Stress Images:  Normal homogeneous uptake in all areas of the myocardium. Rest Images:  Normal homogeneous uptake in all areas of the myocardium. Subtraction (SDS):  No evidence of ischemia. Transient Ischemic Dilatation (Normal <1.22):  n/a Lung/Heart Ratio (Normal <0.45):  0.38  Quantitative Gated Spect Images QGS EDV:  73 ml QGS ESV:  23 ml  Impression Exercise Capacity:  Lexiscan with no exercise. BP Response:  Normal blood pressure response. Clinical Symptoms:  There is dyspnea. ECG Impression:  No significant ST segment change suggestive of ischemia. Comparison with Prior Nuclear Study: Since study of July 2010, inferior ischemia absent based on comparison with report.  Overall Impression:  Normal stress nuclear study.  LV Ejection Fraction: 69%.  LV Wall Motion:  NL LV Function; NL Wall Motion  Olga Millers

## 2012-11-06 ENCOUNTER — Other Ambulatory Visit: Payer: Self-pay | Admitting: *Deleted

## 2012-11-06 DIAGNOSIS — I4891 Unspecified atrial fibrillation: Secondary | ICD-10-CM

## 2012-11-06 MED ORDER — APIXABAN 5 MG PO TABS: 5.0000 mg | ORAL_TABLET | Freq: Two times a day (BID) | ORAL | Status: DC

## 2012-11-06 NOTE — Telephone Encounter (Signed)
spoke to laurie, cleared up the question about eliquis or xarelto. per last OV 10/30/12 pt was switched to eliquis. verified with [facility] on 11/06/12 @ 845

## 2013-01-08 ENCOUNTER — Telehealth: Payer: Self-pay | Admitting: Cardiology

## 2013-01-08 NOTE — Telephone Encounter (Signed)
Spoke with pt, answered regarding medicine answered. Orders for labs mailed to pt for lab draw in [place]

## 2013-01-08 NOTE — Telephone Encounter (Signed)
New Prob  Pt would like to speak with you regarding his medication. He would not give any other information.

## 2013-01-13 ENCOUNTER — Telehealth: Payer: Self-pay | Admitting: *Deleted

## 2013-01-13 MED ORDER — ISOSORBIDE DINITRATE 20 MG PO TABS: ORAL_TABLET | ORAL | Status: DC

## 2013-01-13 NOTE — Telephone Encounter (Signed)
Faxed refill request received from [facility] pharmacy for Isosorbide stating pt "is out of medication for 5 days" Last filled by MD on 04.09.14, #[id number] [to [facility]: 6-mth supply] Last AEX - 06.25.14 Phoned patient to inquire as to why he is out of medication, as he should have enough medicine via [facility] pharmacy to last until October 2014. Pt was rude [as usual] and states that "just as he had told Dr. Ludwig Clarks office, it is a bunch of crap, that he has to wait on his medication?" Informed pt that we are just trying to clarify if he needs short term supply while awaiting mail order and/or if there is another reason that new local pharmacy is requesting refill on his medication. Finally understood that pt can no longer use [facility] w/o large OOP cost d/t Insurance and medication needs to be sent to local pharmacy; informed pt that we understood & would send medication to requested pharmacy. Rx request to pharmacy/SLS

## 2013-01-29 ENCOUNTER — Other Ambulatory Visit: Payer: Self-pay | Admitting: Cardiology

## 2013-01-29 ENCOUNTER — Telehealth: Payer: Self-pay | Admitting: Family

## 2013-01-29 LAB — TSH: TSH: 2.888 u[IU]/mL (ref 0.350–4.500)

## 2013-01-29 LAB — CBC
MCH: 29.8 pg (ref 26.0–34.0)
MCHC: 34.2 g/dL (ref 30.0–36.0)
Platelets: 181 10*3/uL (ref 150–400)
RDW: 15.2 % (ref 11.5–15.5)

## 2013-01-29 LAB — HEPATIC FUNCTION PANEL
ALT: 10 U/L (ref 0–53)
AST: 18 U/L (ref 0–37)
Albumin: 3.9 g/dL (ref 3.5–5.2)
Bilirubin, Direct: 0.2 mg/dL (ref 0.0–0.3)

## 2013-01-29 LAB — LIPID PANEL
Cholesterol: 158 mg/dL (ref 0–200)
HDL: 35 mg/dL — ABNORMAL LOW (ref >39)
Total CHOL/HDL Ratio: 4.5 Ratio

## 2013-01-29 LAB — BASIC METABOLIC PANEL
Calcium: 9 mg/dL (ref 8.4–10.5)
Sodium: 141 mEq/L (ref 135–145)

## 2013-01-29 NOTE — Telephone Encounter (Signed)
Patient was told by [facility] that they contacted the office several times and heard nothing.  According to [facility] what we faxed to them had the wrong fax number on it.  He says it was Dr Hodgin's  Previous fax number.  [facility] says they cant change the fax number until they get a message from the doctors office.  He is in the donut hole.  He will not be using [facility] for a while.  He will be using assorted pharmacies.  Who ever can give the best price.

## 2013-01-29 NOTE — Telephone Encounter (Signed)
Attempted to reach pt and verify if refills are needed at this time and left message for pt to return my call.

## 2013-01-31 ENCOUNTER — Ambulatory Visit (INDEPENDENT_AMBULATORY_CARE_PROVIDER_SITE_OTHER): Payer: Medicare PPO | Admitting: Family

## 2013-01-31 ENCOUNTER — Encounter: Payer: Self-pay | Admitting: Family

## 2013-01-31 VITALS — BP 126/88 | HR 85 | Temp 97.9°F | Resp 18 | Ht 71.5 in | Wt 236.1 lb

## 2013-01-31 DIAGNOSIS — I10 Essential (primary) hypertension: Secondary | ICD-10-CM

## 2013-01-31 DIAGNOSIS — Z23 Encounter for immunization: Secondary | ICD-10-CM

## 2013-01-31 DIAGNOSIS — L989 Disorder of the skin and subcutaneous tissue, unspecified: Secondary | ICD-10-CM

## 2013-01-31 DIAGNOSIS — I4891 Unspecified atrial fibrillation: Secondary | ICD-10-CM

## 2013-01-31 MED ORDER — SERTRALINE HCL 100 MG PO TABS: ORAL_TABLET | ORAL | Status: DC

## 2013-01-31 MED ORDER — TRIAMTERENE-HCTZ 37.5-25 MG PO TABS: ORAL_TABLET | ORAL | Status: DC

## 2013-01-31 MED ORDER — AMLODIPINE BESYLATE 10 MG PO TABS: ORAL_TABLET | ORAL | Status: DC

## 2013-01-31 MED ORDER — RANITIDINE HCL 150 MG PO TABS: 150.0000 mg | ORAL_TABLET | Freq: Two times a day (BID) | ORAL | Status: DC

## 2013-01-31 MED ORDER — LOSARTAN POTASSIUM 50 MG PO TABS: ORAL_TABLET | ORAL | Status: DC

## 2013-01-31 MED ORDER — METOPROLOL TARTRATE 100 MG PO TABS: ORAL_TABLET | ORAL | Status: DC

## 2013-01-31 NOTE — Assessment & Plan Note (Signed)
Appears to be a keratosis, but the area is irregular in color.  Advised pt to have this biopsied.  He declines due to finances.

## 2013-01-31 NOTE — Patient Instructions (Addendum)
Please follow up in 6 months. Schedule a skin biopsy when you are able.

## 2013-01-31 NOTE — Assessment & Plan Note (Signed)
BP Readings from Last 3 Encounters:  01/31/13 126/88  11/05/12 142/115  10/30/12 128/80   BP Readings from Last 3 Encounters:  01/31/13 126/88  11/05/12 142/115  10/30/12 128/80    BP stable on current meds continue same.  Reviewed BMET results- kidney function is stable.

## 2013-01-31 NOTE — Progress Notes (Signed)
Subjective:    Patient ID: Mark [name], male    DOB: Jul 07, 1940, 72 y.o.   MRN: [id number]  HPI  Mr. Mark [name] is a 72 yr old male who presents today for follow up.  1) Skin lesion- pt has noticed a lesion on the right side of his back.   2) HTN-  Currently maintainted on  Amlodipine, losartan, metoprolol.  3) Hyperlipidemia-  Currently maintained on pravastatin. LDL 104- this was ordered by Dr. Jens Som   Review of Systems See HPI  Past Medical History  Diagnosis Date  . Hypertension   . Hyperlipidemia   . CAD (coronary artery disease)   . History of nephrolithiasis   . GERD (gastroesophageal reflux disease)   . Depression   . History of skin cancer   . History of pneumothorax   . AAA (abdominal aortic aneurysm)     9.1 cm repair 05/2009  . Atrial fibrillation     History   Social History  . Marital Status: Divorced    Spouse Name: N/A    Number of Children: 2  . Years of Education: N/A   Occupational History  .     Social History Main Topics  . Smoking status: Former Smoker    Quit date: 08/24/1980  . Smokeless tobacco: Never Used     Comment: quit   . Alcohol Use: 1.5 oz/week    3 drink(s) per week     Comment: Occasional  . Drug Use: No  . Sexual Activity: Not on file   Other Topics Concern  . Not on file   Social History Narrative   Retired   Divorced   2 sons 10 &30     Occupation:  Part time sports broadcaster    Smoking Status:  quit (08/24/1980)   Packs/Day:  1.0    Caffeine use/day:  3 beverages daily    Does Patient Exercise:  no   Alcohol Use - yes    Past Surgical History  Procedure Laterality Date  . Cardiac catheterization  06/2004  . Abdominal aortic aneurysm repair    . Tonsillectomy    . Athroscopic knee surgery      Family History  Problem Relation Age of Onset  . Coronary artery disease    . Hyperlipidemia    . Hypertension    . Pulmonary embolism Mother     died of PE  . Prostate cancer    . Anuerysm Father    history of popliteal  . Other Neg Hx     polycystic kidney disease, and colon cancer  . Colon cancer Neg Hx     Allergies  Allergen Reactions  . Ace Inhibitors     REACTION: Cough    Current Outpatient Prescriptions on File Prior to Visit  Medication Sig Dispense Refill  . apixaban (ELIQUIS) 5 MG TABS tablet Take 1 tablet (5 mg total) by mouth 2 (two) times daily.  180 tablet  3  . Coenzyme Q10 (COQ-10) 100 MG CAPS Take 200 mg by mouth daily.       . isosorbide dinitrate (ISORDIL) 20 MG tablet TAKE 1 TABLET THREE TIMES DAILY  90 tablet  1  . Multiple Vitamin (MULTIVITAMIN) tablet Take 1 tablet by mouth daily.        . Nutritional Supplements (MELATONIN PO) Take by mouth daily as needed.       . Omega-3 Fatty Acids (FISH OIL) 1200 MG CAPS Take 1,200 mg by mouth 2 (two) times daily.       Marland Kitchen  pravastatin (PRAVACHOL) 40 MG tablet Take 1 tablet (40 mg total) by mouth every evening.  90 tablet  3   No current facility-administered medications on file prior to visit.    BP 126/88  Pulse 85  Temp(Src) 97.9 F (36.6 C) (Oral)  Resp 18  Ht 5' 11.5" (1.816 m)  Wt 236 lb 1.3 oz (107.085 kg)  BMI 32.47 kg/m2  SpO2 96%       Objective:   Physical Exam  Constitutional: He is oriented to person, place, and time. He appears well-developed and well-nourished. No distress.  Cardiovascular: Normal rate and regular rhythm.   Pulmonary/Chest: Effort normal and breath sounds normal. No respiratory distress. He has no wheezes. He has no rales. He exhibits no tenderness.  Musculoskeletal: He exhibits no edema.  Neurological: He is alert and oriented to person, place, and time.  Psychiatric: He has a normal mood and affect. His behavior is normal. Judgment and thought content normal.  skin: flat rough raised lesion right mid back.  approx 1 cm long, some darkening noted on edge        Assessment & Plan:

## 2013-02-14 ENCOUNTER — Telehealth: Payer: Self-pay | Admitting: *Deleted

## 2013-02-14 MED ORDER — ISOSORBIDE DINITRATE 20 MG PO TABS: ORAL_TABLET | ORAL | Status: DC

## 2013-02-14 NOTE — Telephone Encounter (Signed)
Received call from pt that [facility] did not receive our previous rx for isosorbide. Spoke with Nehemiah Settle, and gave verbal for #[phone] as pt is requesting 90 day supply. Pt made aware.

## 2013-02-27 ENCOUNTER — Telehealth: Payer: Self-pay | Admitting: Cardiology

## 2013-02-27 DIAGNOSIS — I4891 Unspecified atrial fibrillation: Secondary | ICD-10-CM

## 2013-02-27 MED ORDER — APIXABAN 5 MG PO TABS: 5.0000 mg | ORAL_TABLET | Freq: Two times a day (BID) | ORAL | Status: DC

## 2013-02-27 NOTE — Telephone Encounter (Signed)
Spoke with pt, discount cards and scripts placed at the front desk for pt to pick up. eliquis not coumadin.

## 2013-02-27 NOTE — Telephone Encounter (Signed)
New Problem:  Pt states he is calling about samples or coumadin... I offered to transfer the pt to our refill team but he denied my offer. Pt states he wants me to send a message to Stanton Kidney b/c she's his nurse. Please advise.

## 2013-03-04 ENCOUNTER — Telehealth: Payer: Self-pay | Admitting: Cardiology

## 2013-03-04 DIAGNOSIS — I4891 Unspecified atrial fibrillation: Secondary | ICD-10-CM

## 2013-03-04 MED ORDER — PRAVASTATIN SODIUM 40 MG PO TABS: 40.0000 mg | ORAL_TABLET | Freq: Every evening | ORAL | Status: DC

## 2013-03-04 NOTE — Telephone Encounter (Signed)
New message    Out of pravastatin----[facility] [place]/  Want msg to go to [facility] instead of refill dept

## 2013-04-02 ENCOUNTER — Telehealth: Payer: Self-pay | Admitting: *Deleted

## 2013-04-02 NOTE — Telephone Encounter (Signed)
Patient requests samples of eliquis. Patient aware samples left at front desk for pick up.

## 2013-04-14 ENCOUNTER — Telehealth: Payer: Self-pay | Admitting: Cardiology

## 2013-04-14 NOTE — Telephone Encounter (Signed)
New message  Pt returned call about Eliquis alternative.Mark [name] He is down to two days of medication.. Please assist

## 2013-04-14 NOTE — Telephone Encounter (Signed)
Left message for pt, eliquis samples placed at the front desk for pick up

## 2013-04-23 ENCOUNTER — Encounter: Payer: Self-pay | Admitting: Family

## 2013-04-23 ENCOUNTER — Ambulatory Visit (INDEPENDENT_AMBULATORY_CARE_PROVIDER_SITE_OTHER): Payer: Medicare PPO | Admitting: Family

## 2013-04-23 VITALS — BP 140/86 | HR 95 | Temp 97.8°F | Resp 16 | Ht 71.5 in | Wt 237.1 lb

## 2013-04-23 DIAGNOSIS — I10 Essential (primary) hypertension: Secondary | ICD-10-CM

## 2013-04-23 DIAGNOSIS — E785 Hyperlipidemia, unspecified: Secondary | ICD-10-CM

## 2013-04-23 DIAGNOSIS — N400 Enlarged prostate without lower urinary tract symptoms: Secondary | ICD-10-CM

## 2013-04-23 LAB — BASIC METABOLIC PANEL
BUN: 34 mg/dL — ABNORMAL HIGH (ref 6–23)
CO2: 25 mEq/L (ref 19–32)
Calcium: 9.3 mg/dL (ref 8.4–10.5)
Chloride: 103 mEq/L (ref 96–112)
Creat: 1.54 mg/dL — ABNORMAL HIGH (ref 0.50–1.35)
Glucose, Bld: 99 mg/dL (ref 70–99)
Potassium: 3.8 mEq/L (ref 3.5–5.3)

## 2013-04-23 LAB — HEPATIC FUNCTION PANEL
Albumin: 4.1 g/dL (ref 3.5–5.2)
Alkaline Phosphatase: 63 U/L (ref 39–117)
Bilirubin, Direct: 0.2 mg/dL (ref 0.0–0.3)
Indirect Bilirubin: 0.9 mg/dL (ref 0.0–0.9)
Total Bilirubin: 1.1 mg/dL (ref 0.3–1.2)

## 2013-04-23 MED ORDER — TAMSULOSIN HCL 0.4 MG PO CAPS: 0.4000 mg | ORAL_CAPSULE | Freq: Every day | ORAL | Status: DC

## 2013-04-23 NOTE — Progress Notes (Signed)
Subjective:    Patient ID: Mark [name], male    DOB: Mar 29, 1941, 72 y.o.   MRN: [id number]  HPI  Mark [name] is a 72 yr old male who presents today for follow up of his HTN.   BP Readings from Last 3 Encounters:  04/23/13 140/86  01/31/13 126/88  11/05/12 142/115   Nocturia- 5-6 times a night.  Review of Systems See HPI  Past Medical History  Diagnosis Date  . Hypertension   . Hyperlipidemia   . CAD (coronary artery disease)   . History of nephrolithiasis   . GERD (gastroesophageal reflux disease)   . Depression   . History of skin cancer   . History of pneumothorax   . AAA (abdominal aortic aneurysm)     9.1 cm repair 05/2009  . Atrial fibrillation     History   Social History  . Marital Status: Divorced    Spouse Name: N/A    Number of Children: 2  . Years of Education: N/A   Occupational History  .     Social History Main Topics  . Smoking status: Former Smoker    Quit date: 08/24/1980  . Smokeless tobacco: Never Used     Comment: quit   . Alcohol Use: 1.5 oz/week    3 drink(s) per week     Comment: Occasional  . Drug Use: No  . Sexual Activity: Not on file   Other Topics Concern  . Not on file   Social History Narrative   Retired   Divorced   2 sons 46 &30     Occupation:  Part time sports broadcaster    Smoking Status:  quit (08/24/1980)   Packs/Day:  1.0    Caffeine use/day:  3 beverages daily    Does Patient Exercise:  no   Alcohol Use - yes    Past Surgical History  Procedure Laterality Date  . Cardiac catheterization  06/2004  . Abdominal aortic aneurysm repair    . Tonsillectomy    . Athroscopic knee surgery      Family History  Problem Relation Age of Onset  . Coronary artery disease    . Hyperlipidemia    . Hypertension    . Pulmonary embolism Mother     died of PE  . Prostate cancer    . Anuerysm Father     history of popliteal  . Other Neg Hx     polycystic kidney disease, and colon cancer  . Colon cancer Neg  Hx     Allergies  Allergen Reactions  . Ace Inhibitors     REACTION: Cough    Current Outpatient Prescriptions on File Prior to Visit  Medication Sig Dispense Refill  . amLODipine (NORVASC) 10 MG tablet 1/2 tablet by mouth once a day  45 tablet  5  . apixaban (ELIQUIS) 5 MG TABS tablet Take 1 tablet (5 mg total) by mouth 2 (two) times daily.  60 tablet  12  . Coenzyme Q10 (COQ-10) 100 MG CAPS Take 200 mg by mouth daily.       . isosorbide dinitrate (ISORDIL) 20 MG tablet TAKE 1 TABLET THREE TIMES DAILY  270 tablet  1  . losartan (COZAAR) 50 MG tablet TAKE 1 TABLET EVERY DAY  90 tablet  1  . metoprolol (LOPRESSOR) 100 MG tablet TAKE 1 TABLET TWICE DAILY  180 tablet  1  . Multiple Vitamin (MULTIVITAMIN) tablet Take 1 tablet by mouth daily.        Marland Kitchen  Nutritional Supplements (MELATONIN PO) Take by mouth daily as needed.       . Omega-3 Fatty Acids (FISH OIL) 1200 MG CAPS Take 1,200 mg by mouth 2 (two) times daily.       . pravastatin (PRAVACHOL) 40 MG tablet Take 1 tablet (40 mg total) by mouth every evening.  90 tablet  3  . ranitidine (ZANTAC) 150 MG tablet Take 1 tablet (150 mg total) by mouth 2 (two) times daily.  180 tablet  1  . sertraline (ZOLOFT) 100 MG tablet TAKE 1 TABLET EVERY DAY  90 tablet  1  . triamterene-hydrochlorothiazide (MAXZIDE-25) 37.5-25 MG per tablet TAKE 1/2 TABLET EVERY MORNING  45 tablet  1   No current facility-administered medications on file prior to visit.    BP 140/86  Pulse 95  Temp(Src) 97.8 F (36.6 C) (Oral)  Resp 16  Ht 5' 11.5" (1.816 m)  Wt 237 lb 1.3 oz (107.539 kg)  BMI 32.61 kg/m2  SpO2 98%       Objective:   Physical Exam  Constitutional: He is oriented to person, place, and time. He appears well-developed and well-nourished. No distress.  HENT:  Head: Normocephalic and atraumatic.  Cardiovascular: Normal rate and regular rhythm.   No murmur heard. Pulmonary/Chest: Effort normal and breath sounds normal. No respiratory distress. He  has no wheezes. He has no rales. He exhibits no tenderness.  Musculoskeletal: He exhibits no edema.  Lymphadenopathy:    He has no cervical adenopathy.  Neurological: He is alert and oriented to person, place, and time.  Psychiatric: He has a normal mood and affect. His behavior is normal. Judgment and thought content normal.          Assessment & Plan:

## 2013-04-23 NOTE — Patient Instructions (Signed)
Start flomax when you are able.  Call if night time urination worsens or if not improved with use of flomax. Please schedule a follow up appointment in 3 months.

## 2013-04-23 NOTE — Progress Notes (Signed)
Pre visit review using our clinic review tool, if applicable. No additional management support is needed unless otherwise documented below in the visit note. 

## 2013-04-24 ENCOUNTER — Encounter: Payer: Self-pay | Admitting: Family

## 2013-04-24 NOTE — Assessment & Plan Note (Signed)
Stable BP, continue maxide, amlodipine, obtain bmet.

## 2013-04-24 NOTE — Assessment & Plan Note (Signed)
Now with bothersome nocturia. Trial of flomax.  Hx normal PSAs :  Lab Results  Component Value Date   PSA 2.13 07/25/2012   PSA 2.28 11/10/2010   PSA 1.55 02/22/2010

## 2013-05-19 ENCOUNTER — Telehealth: Payer: Self-pay | Admitting: Family

## 2013-05-19 DIAGNOSIS — I4891 Unspecified atrial fibrillation: Secondary | ICD-10-CM

## 2013-05-19 MED ORDER — SERTRALINE HCL 100 MG PO TABS: ORAL_TABLET | ORAL | Status: DC

## 2013-05-19 MED ORDER — ISOSORBIDE DINITRATE 20 MG PO TABS: ORAL_TABLET | ORAL | Status: DC

## 2013-05-19 MED ORDER — TAMSULOSIN HCL 0.4 MG PO CAPS: 0.4000 mg | ORAL_CAPSULE | Freq: Every day | ORAL | Status: DC

## 2013-05-19 MED ORDER — AMLODIPINE BESYLATE 10 MG PO TABS: ORAL_TABLET | ORAL | Status: DC

## 2013-05-19 MED ORDER — PRAVASTATIN SODIUM 40 MG PO TABS: 40.0000 mg | ORAL_TABLET | Freq: Every evening | ORAL | Status: DC

## 2013-05-19 NOTE — Telephone Encounter (Signed)
Patient is requesting refills of sertraline, amlodipine, pravastatin, and Isordil sent to [facility]. Patient is also requesting flomax .4mg  to be sent as well.

## 2013-05-19 NOTE — Telephone Encounter (Signed)
Refills sent. Notified pt. 

## 2013-06-02 ENCOUNTER — Telehealth: Payer: Self-pay | Admitting: Cardiology

## 2013-06-02 NOTE — Telephone Encounter (Signed)
Spoke with pt, samples placed at the front desk for pick up

## 2013-06-02 NOTE — Telephone Encounter (Signed)
New Prob    Pt has some questions regarding Eliquis. Please call.

## 2013-06-09 ENCOUNTER — Other Ambulatory Visit: Payer: Self-pay | Admitting: *Deleted

## 2013-06-09 NOTE — Telephone Encounter (Signed)
Pt left message requesting refills of metoprolol, losartan and ranitidine be sent to [facility] mail order.  Please advise re: contraindication below:   High   Drug-Drug: triamterene-hydrochlorothiazide and losartan  The risk of hyperkalemia may be increased when Potassium-Sparing Diuretics are co-administered with Angiotensin II Receptor Antagonists.  DetailsOverride Reason.Marland KitchenMarland KitchenBenefit outweighs riskDose AppropriateClinician ReviewedDefer to [employer]

## 2013-06-10 MED ORDER — METOPROLOL TARTRATE 100 MG PO TABS: ORAL_TABLET | ORAL | Status: DC

## 2013-06-10 MED ORDER — LOSARTAN POTASSIUM 50 MG PO TABS: ORAL_TABLET | ORAL | Status: DC

## 2013-06-10 MED ORDER — RANITIDINE HCL 150 MG PO TABS: 150.0000 mg | ORAL_TABLET | Freq: Two times a day (BID) | ORAL | Status: DC

## 2013-06-23 ENCOUNTER — Telehealth: Payer: Self-pay | Admitting: *Deleted

## 2013-06-23 NOTE — Telephone Encounter (Signed)
2 weeks eliquis samples, patient assistance form mailed also

## 2013-06-30 ENCOUNTER — Telehealth: Payer: Self-pay | Admitting: Cardiology

## 2013-06-30 NOTE — Telephone Encounter (Signed)
New problem   Pt having SOB and need to speak to nurse concerning this matter.

## 2013-06-30 NOTE — Telephone Encounter (Signed)
Returned call to patient no answer.LMTC. 

## 2013-07-01 NOTE — Telephone Encounter (Signed)
Left message for patient to call back  

## 2013-07-01 NOTE — Telephone Encounter (Signed)
New Prob     Pt states he is having some side effects to Eliquis. He reports SOB under mild exertion. Please call.

## 2013-07-09 NOTE — Telephone Encounter (Signed)
Spoke with pt, he reports having an increase in his SOB. He is getting SOB when he goes up the stairs. Also he feels the SOB has increased with little exertion. This is a change from his normal. He denies chest pain. Follow up appt made and samples of eliquis placed at the front desk for pt pick up. Pt agreed with this plan.

## 2013-07-09 NOTE — Telephone Encounter (Signed)
Left message for pt to call.

## 2013-07-09 NOTE — Telephone Encounter (Signed)
Follow up  ° ° ° °Returning call back to nurse  °

## 2013-07-16 ENCOUNTER — Encounter: Payer: Self-pay | Admitting: Family

## 2013-07-16 ENCOUNTER — Ambulatory Visit (INDEPENDENT_AMBULATORY_CARE_PROVIDER_SITE_OTHER): Payer: Medicare PPO | Admitting: Family

## 2013-07-16 ENCOUNTER — Telehealth: Payer: Self-pay | Admitting: Family

## 2013-07-16 VITALS — BP 128/88 | HR 88 | Temp 97.5°F | Resp 20 | Ht 71.5 in | Wt 238.0 lb

## 2013-07-16 DIAGNOSIS — N259 Disorder resulting from impaired renal tubular function, unspecified: Secondary | ICD-10-CM

## 2013-07-16 DIAGNOSIS — N400 Enlarged prostate without lower urinary tract symptoms: Secondary | ICD-10-CM

## 2013-07-16 DIAGNOSIS — E785 Hyperlipidemia, unspecified: Secondary | ICD-10-CM

## 2013-07-16 DIAGNOSIS — Z23 Encounter for immunization: Secondary | ICD-10-CM

## 2013-07-16 DIAGNOSIS — F3289 Other specified depressive episodes: Secondary | ICD-10-CM

## 2013-07-16 DIAGNOSIS — I4891 Unspecified atrial fibrillation: Secondary | ICD-10-CM

## 2013-07-16 DIAGNOSIS — R0602 Shortness of breath: Secondary | ICD-10-CM

## 2013-07-16 DIAGNOSIS — F329 Major depressive disorder, single episode, unspecified: Secondary | ICD-10-CM

## 2013-07-16 DIAGNOSIS — I10 Essential (primary) hypertension: Secondary | ICD-10-CM

## 2013-07-16 LAB — LIPID PANEL
CHOLESTEROL: 162 mg/dL (ref 0–200)
HDL: 34 mg/dL — AB (ref >39)
LDL CALC: 109 mg/dL — AB (ref 0–99)
TRIGLYCERIDES: 97 mg/dL (ref <150)
Total CHOL/HDL Ratio: 4.8 Ratio
VLDL: 19 mg/dL (ref 0–40)

## 2013-07-16 LAB — BASIC METABOLIC PANEL WITH GFR
BUN: 32 mg/dL — ABNORMAL HIGH (ref 6–23)
CALCIUM: 9.2 mg/dL (ref 8.4–10.5)
CO2: 24 mEq/L (ref 19–32)
Chloride: 104 mEq/L (ref 96–112)
Creat: 1.37 mg/dL — ABNORMAL HIGH (ref 0.50–1.35)
GFR, EST AFRICAN AMERICAN: 60 mL/min
GFR, Est Non African American: 52 mL/min — ABNORMAL LOW
GLUCOSE: 100 mg/dL — AB (ref 70–99)
Potassium: 3.9 mEq/L (ref 3.5–5.3)
Sodium: 141 mEq/L (ref 135–145)

## 2013-07-16 LAB — HEPATIC FUNCTION PANEL
ALT: 11 U/L (ref 0–53)
AST: 17 U/L (ref 0–37)
Albumin: 3.9 g/dL (ref 3.5–5.2)
Alkaline Phosphatase: 67 U/L (ref 39–117)
BILIRUBIN DIRECT: 0.2 mg/dL (ref 0.0–0.3)
Indirect Bilirubin: 0.5 mg/dL (ref 0.2–1.2)
Total Bilirubin: 0.7 mg/dL (ref 0.2–1.2)
Total Protein: 6.7 g/dL (ref 6.0–8.3)

## 2013-07-16 MED ORDER — TRIAMTERENE-HCTZ 37.5-25 MG PO TABS: ORAL_TABLET | ORAL | Status: DC

## 2013-07-16 NOTE — Assessment & Plan Note (Signed)
Repeat lipid panel, hepatic panel, continue statin.

## 2013-07-16 NOTE — Assessment & Plan Note (Signed)
Stable on sertraline Continue same.  

## 2013-07-16 NOTE — Assessment & Plan Note (Signed)
Some improvement in nocturia with flomax, continue same.

## 2013-07-16 NOTE — Telephone Encounter (Signed)
Relevant patient education assigned to patient using Emmi. ° °

## 2013-07-16 NOTE — Progress Notes (Signed)
Subjective:    Patient ID: Mark [name], male    DOB: 1940/08/03, 73 y.o.   MRN: [id number]  HPI  Mr. Mark [name] is a 73 yr old male who presents today for follow up.  1) HTN- current BP meds include losartan, amlodipine, metoprolol, triamterene hctz.  BP Readings from Last 3 Encounters:  07/16/13 128/88  04/23/13 140/86  01/31/13 126/88     2) Hx CAD/AF- reports sob. Can occur at rest and is worse with exertion. Feels that SOB has worsened since he started the eliquis. On eliquis. HR today is 88. Had normal myoview in July.  Reports that he has had some intermittent "heartburn" symptoms.  Wt Readings from Last 3 Encounters:  07/16/13 238 lb (107.956 kg)  04/23/13 237 lb 1.3 oz (107.539 kg)  01/31/13 236 lb 1.3 oz (107.085 kg)   3) Hyperlipidemia-  Maintained on pravastatin.  Last LDL 104 in September.  Denies myalgia.   4) renal insufficiency- last creatinine was 1.54 in December.    5) Depression- not currently on antidepressant. Reports stable on sertraline.  6) BPH-  Last visit Flomax was added to his regimen due to nocturia. Notes some improvement in his nocturia.  Now getting up 3 times a night.     Review of Systems    see HPI  Past Medical History  Diagnosis Date  . Hypertension   . Hyperlipidemia   . CAD (coronary artery disease)   . History of nephrolithiasis   . GERD (gastroesophageal reflux disease)   . Depression   . History of skin cancer   . History of pneumothorax   . AAA (abdominal aortic aneurysm)     9.1 cm repair 05/2009  . Atrial fibrillation     History   Social History  . Marital Status: Divorced    Spouse Name: N/A    Number of Children: 2  . Years of Education: N/A   Occupational History  .     Social History Main Topics  . Smoking status: Former Smoker    Quit date: 08/24/1980  . Smokeless tobacco: Never Used     Comment: quit   . Alcohol Use: 1.5 oz/week    3 drink(s) per week     Comment: Occasional  . Drug Use: No  .  Sexual Activity: Not on file   Other Topics Concern  . Not on file   Social History Narrative   Retired   Divorced   2 sons 88 &30     Occupation:  Part time sports broadcaster    Smoking Status:  quit (08/24/1980)   Packs/Day:  1.0    Caffeine use/day:  3 beverages daily    Does Patient Exercise:  no   Alcohol Use - yes    Past Surgical History  Procedure Laterality Date  . Cardiac catheterization  06/2004  . Abdominal aortic aneurysm repair    . Tonsillectomy    . Athroscopic knee surgery      Family History  Problem Relation Age of Onset  . Coronary artery disease    . Hyperlipidemia    . Hypertension    . Pulmonary embolism Mother     died of PE  . Prostate cancer    . Anuerysm Father     history of popliteal  . Other Neg Hx     polycystic kidney disease, and colon cancer  . Colon cancer Neg Hx     Allergies  Allergen Reactions  . Ace Inhibitors  REACTION: Cough    Current Outpatient Prescriptions on File Prior to Visit  Medication Sig Dispense Refill  . amLODipine (NORVASC) 10 MG tablet 1/2 tablet by mouth once a day  45 tablet  1  . apixaban (ELIQUIS) 5 MG TABS tablet Take 1 tablet (5 mg total) by mouth 2 (two) times daily.  60 tablet  12  . Coenzyme Q10 (COQ-10) 100 MG CAPS Take 200 mg by mouth daily.       . isosorbide dinitrate (ISORDIL) 20 MG tablet TAKE 1 TABLET THREE TIMES DAILY  270 tablet  1  . losartan (COZAAR) 50 MG tablet TAKE 1 TABLET EVERY DAY  90 tablet  1  . metoprolol (LOPRESSOR) 100 MG tablet TAKE 1 TABLET TWICE DAILY  180 tablet  1  . Multiple Vitamin (MULTIVITAMIN) tablet Take 1 tablet by mouth daily.        . Nutritional Supplements (MELATONIN PO) Take by mouth daily as needed.       . Omega-3 Fatty Acids (FISH OIL) 1200 MG CAPS Take 1,200 mg by mouth 2 (two) times daily.       . pravastatin (PRAVACHOL) 40 MG tablet Take 1 tablet (40 mg total) by mouth every evening.  90 tablet  1  . ranitidine (ZANTAC) 150 MG tablet Take 1 tablet  (150 mg total) by mouth 2 (two) times daily.  180 tablet  1  . sertraline (ZOLOFT) 100 MG tablet TAKE 1 TABLET EVERY DAY  90 tablet  1  . tamsulosin (FLOMAX) 0.4 MG CAPS capsule Take 1 capsule (0.4 mg total) by mouth daily.  90 capsule  1  . triamterene-hydrochlorothiazide (MAXZIDE-25) 37.5-25 MG per tablet TAKE 1/2 TABLET EVERY MORNING  45 tablet  1   No current facility-administered medications on file prior to visit.    BP 128/88  Pulse 88  Temp(Src) 97.5 F (36.4 C) (Oral)  Resp 20  Ht 5' 11.5" (1.816 m)  Wt 238 lb (107.956 kg)  BMI 32.74 kg/m2  SpO2 99%    Objective:   Physical Exam  Constitutional: He is oriented to person, place, and time. He appears well-developed and well-nourished. No distress.  HENT:  Head: Normocephalic and atraumatic.  Cardiovascular: An irregularly irregular rhythm present.  Pulmonary/Chest: Effort normal and breath sounds normal.  Musculoskeletal: He exhibits no edema.  Neurological: He is alert and oriented to person, place, and time.  Psychiatric: He has a normal mood and affect. His behavior is normal. Judgment and thought content normal.          Assessment & Plan:

## 2013-07-16 NOTE — Assessment & Plan Note (Addendum)
EKG is performed in office today and notes AF- rate controlled.  Continue metoprolol for rate control. Continue eliquis. Keep upcoming follow up with Dr. Stanford Breed.  I suspect that his SOB is related to his atrial fibrillation. Could consider pulmonary referral if cardiology does not feel that his SOB is cardiac in nature.  He appears euvolemic today.

## 2013-07-16 NOTE — Patient Instructions (Signed)
Please complete your lab work prior to leaving. Keep upcoming appointment with Dr. Stanford Breed. Let me know if shortness of breath worsens, or if it does not improve.  Follow up with [place] in 3 months.

## 2013-07-16 NOTE — Assessment & Plan Note (Signed)
Check bmet to assess renal function.

## 2013-07-16 NOTE — Assessment & Plan Note (Addendum)
BP is stable on current meds. Continue same, obtain bmet.  

## 2013-07-17 ENCOUNTER — Telehealth: Payer: Self-pay | Admitting: Family

## 2013-07-17 DIAGNOSIS — E785 Hyperlipidemia, unspecified: Secondary | ICD-10-CM

## 2013-07-17 MED ORDER — PRAVASTATIN SODIUM 80 MG PO TABS: 80.0000 mg | ORAL_TABLET | Freq: Every day | ORAL | Status: DC

## 2013-07-17 NOTE — Telephone Encounter (Signed)
Cholesterol above goal. Increase pravastatin from 40mg  to 80 mg.  Repeat FLP/LFT in 6 weeks.

## 2013-07-18 NOTE — Telephone Encounter (Signed)
Notified pt and orders entered.

## 2013-07-30 ENCOUNTER — Other Ambulatory Visit: Payer: Self-pay | Admitting: *Deleted

## 2013-07-30 ENCOUNTER — Encounter: Payer: Self-pay | Admitting: Cardiology

## 2013-07-30 ENCOUNTER — Ambulatory Visit (INDEPENDENT_AMBULATORY_CARE_PROVIDER_SITE_OTHER): Payer: Medicare PPO | Admitting: Cardiology

## 2013-07-30 VITALS — BP 142/86 | HR 86 | Ht 71.0 in | Wt 236.0 lb

## 2013-07-30 DIAGNOSIS — R0609 Other forms of dyspnea: Secondary | ICD-10-CM

## 2013-07-30 DIAGNOSIS — R0989 Other specified symptoms and signs involving the circulatory and respiratory systems: Secondary | ICD-10-CM

## 2013-07-30 DIAGNOSIS — I4891 Unspecified atrial fibrillation: Secondary | ICD-10-CM

## 2013-07-30 DIAGNOSIS — I714 Abdominal aortic aneurysm, without rupture, unspecified: Secondary | ICD-10-CM

## 2013-07-30 DIAGNOSIS — R0602 Shortness of breath: Secondary | ICD-10-CM

## 2013-07-30 DIAGNOSIS — R0600 Dyspnea, unspecified: Secondary | ICD-10-CM | POA: Insufficient documentation

## 2013-07-30 LAB — BRAIN NATRIURETIC PEPTIDE: Brain Natriuretic Peptide: 306.1 pg/mL — ABNORMAL HIGH (ref 0.0–100.0)

## 2013-07-30 LAB — CBC
HCT: 41.2 % (ref 39.0–52.0)
HEMOGLOBIN: 13.9 g/dL (ref 13.0–17.0)
MCH: 29.9 pg (ref 26.0–34.0)
MCHC: 33.7 g/dL (ref 30.0–36.0)
MCV: 88.6 fL (ref 78.0–100.0)
Platelets: 178 10*3/uL (ref 150–400)
RBC: 4.65 MIL/uL (ref 4.22–5.81)
RDW: 15.4 % (ref 11.5–15.5)
WBC: 8 10*3/uL (ref 4.0–10.5)

## 2013-07-30 LAB — BASIC METABOLIC PANEL WITH GFR
BUN: 26 mg/dL — ABNORMAL HIGH (ref 6–23)
CO2: 24 mEq/L (ref 19–32)
CREATININE: 1.48 mg/dL — AB (ref 0.50–1.35)
Calcium: 9.3 mg/dL (ref 8.4–10.5)
Chloride: 106 mEq/L (ref 96–112)
GFR, Est African American: 55 mL/min — ABNORMAL LOW
GFR, Est Non African American: 47 mL/min — ABNORMAL LOW
GLUCOSE: 103 mg/dL — AB (ref 70–99)
POTASSIUM: 3.8 meq/L (ref 3.5–5.3)
Sodium: 142 mEq/L (ref 135–145)

## 2013-07-30 MED ORDER — FUROSEMIDE 40 MG PO TABS
40.0000 mg | ORAL_TABLET | Freq: Every day | ORAL | Status: DC
Start: 2013-07-30 — End: 2013-10-23

## 2013-07-30 MED ORDER — AMLODIPINE BESYLATE 10 MG PO TABS: 10.0000 mg | ORAL_TABLET | Freq: Every day | ORAL | Status: DC

## 2013-07-30 NOTE — Assessment & Plan Note (Signed)
Continue statin. 

## 2013-07-30 NOTE — Patient Instructions (Addendum)
Your physician recommends that you schedule a follow-up appointment in: [place]  Your physician recommends that you HAVE LAB [place] physician has recommended that you wear a 24 HOUR holter monitor. Holter monitors are medical devices that record the heart's electrical activity. Doctors most often use these monitors to diagnose arrhythmias. Arrhythmias are problems with the speed or rhythm of the heartbeat. The monitor is a small, portable device. You can wear one while you do your normal daily activities. This is usually used to diagnose what is causing palpitations/syncope (passing out).    INCREASE AMLODIPINE TO 10 MG ONCE DAILY

## 2013-07-30 NOTE — Assessment & Plan Note (Signed)
Blood pressure elevated. Increase amlodipine to 10 mg daily. 

## 2013-07-30 NOTE — Assessment & Plan Note (Signed)
Continue beta blocker and apixaban. Check hemoglobin and renal function.

## 2013-07-30 NOTE — Progress Notes (Signed)
HPI: FU coronary disease, hypertension and hyperlipidemia. The patient's cardiac history dates back to 2006 when he had PCI of his LAD and first diagonal. Note his LV function was normal. He also had a subtotal of his PDA. Patient had repair of AAA in Jan 2011. Myovue in July of 2014 showed ejection fraction 69% and normal perfusion. Echocardiogram in June of 2014 showed normal LV function, mild left ventricular hypertrophy, mild aortic and mitral regurgitation and biatrial enlargement. Patient also with h/o atrial fibrillation treated with rate control and anticoagulation. Since he was last seen he notes increased dyspnea with activities. No orthopnea, PND, pedal edema, palpitations or syncope. Occasional brief chest pain for seconds but no exertional symptoms.  Current Outpatient Prescriptions  Medication Sig Dispense Refill  . amLODipine (NORVASC) 10 MG tablet 1/2 tablet by mouth once a day  45 tablet  1  . apixaban (ELIQUIS) 5 MG TABS tablet Take 1 tablet (5 mg total) by mouth 2 (two) times daily.  60 tablet  12  . Coenzyme Q10 (COQ-10) 100 MG CAPS Take 200 mg by mouth daily.       . isosorbide dinitrate (ISORDIL) 20 MG tablet TAKE 1 TABLET THREE TIMES DAILY  270 tablet  1  . losartan (COZAAR) 50 MG tablet TAKE 1 TABLET EVERY DAY  90 tablet  1  . metoprolol (LOPRESSOR) 100 MG tablet TAKE 1 TABLET TWICE DAILY  180 tablet  1  . Multiple Vitamin (MULTIVITAMIN) tablet Take 1 tablet by mouth daily.        . Nutritional Supplements (MELATONIN PO) Take by mouth daily as needed.       . Omega-3 Fatty Acids (FISH OIL) 1200 MG CAPS Take 1,200 mg by mouth 2 (two) times daily.       . pravastatin (PRAVACHOL) 80 MG tablet Take 1 tablet (80 mg total) by mouth daily.  90 tablet  0  . ranitidine (ZANTAC) 150 MG tablet Take 1 tablet (150 mg total) by mouth 2 (two) times daily.  180 tablet  1  . sertraline (ZOLOFT) 100 MG tablet TAKE 1 TABLET EVERY DAY  90 tablet  1  . tamsulosin (FLOMAX) 0.4 MG CAPS  capsule Take 1 capsule (0.4 mg total) by mouth daily.  90 capsule  1  . triamterene-hydrochlorothiazide (MAXZIDE-25) 37.5-25 MG per tablet TAKE 1/2 TABLET EVERY MORNING  45 tablet  1   No current facility-administered medications for this visit.     Past Medical History  Diagnosis Date  . Hypertension   . Hyperlipidemia   . CAD (coronary artery disease)   . History of nephrolithiasis   . GERD (gastroesophageal reflux disease)   . Depression   . History of skin cancer   . History of pneumothorax   . AAA (abdominal aortic aneurysm)     9.1 cm repair 05/2009  . Atrial fibrillation     Past Surgical History  Procedure Laterality Date  . Cardiac catheterization  06/2004  . Abdominal aortic aneurysm repair    . Tonsillectomy    . Athroscopic knee surgery      History   Social History  . Marital Status: Divorced    Spouse Name: N/A    Number of Children: 2  . Years of Education: N/A   Occupational History  .     Social History Main Topics  . Smoking status: Former Smoker    Quit date: 08/24/1980  . Smokeless tobacco: Never Used     Comment:  quit   . Alcohol Use: 1.5 oz/week    3 drink(s) per week     Comment: Occasional  . Drug Use: No  . Sexual Activity: Not on file   Other Topics Concern  . Not on file   Social History Narrative   Retired   Divorced   2 sons 59 &30     Occupation:  Part time sports Tax adviser    Smoking Status:  quit (08/24/1980)   Packs/Day:  1.0    Caffeine use/day:  3 beverages daily    Does Patient Exercise:  no   Alcohol Use - yes    ROS: no fevers or chills, productive cough, hemoptysis, dysphasia, odynophagia, melena, hematochezia, dysuria, hematuria, rash, seizure activity, orthopnea, PND, pedal edema, claudication. Remaining systems are negative.  Physical Exam: Well-developed well-nourished in no acute distress.  Skin is warm and dry.  HEENT is normal.  Neck is supple.  Chest is clear to auscultation with normal expansion.   Cardiovascular exam is irregular Abdominal exam nontender or distended. No masses palpated. Extremities show no edema. neuro grossly intact  ECG 07/16/2013-atrial fibrillation with nonspecific ST changes.

## 2013-07-30 NOTE — Assessment & Plan Note (Signed)
follow up vascular surgery  

## 2013-07-30 NOTE — Assessment & Plan Note (Signed)
Etiology unclear. Not volume overloaded on examination. Check BNP. He remains in atrial fibrillation but was not having symptoms previously. Check 24-hour Holter monitor to make sure that rate is controlled. Previous nuclear study did not suggest ischemia. Question contribution from COPD.

## 2013-07-30 NOTE — Assessment & Plan Note (Signed)
Continue statin. Not on aspirin given need for anticoagulation. 

## 2013-08-01 ENCOUNTER — Encounter (INDEPENDENT_AMBULATORY_CARE_PROVIDER_SITE_OTHER): Payer: Medicare PPO

## 2013-08-01 ENCOUNTER — Encounter: Payer: Self-pay | Admitting: Radiology

## 2013-08-01 DIAGNOSIS — I4891 Unspecified atrial fibrillation: Secondary | ICD-10-CM

## 2013-08-01 NOTE — Progress Notes (Signed)
Patient ID: Mark [name], male   DOB: [birth year], 73 y.o.   MRN: [id number] E cardio 24hr holter applied

## 2013-08-19 ENCOUNTER — Telehealth: Payer: Self-pay | Admitting: *Deleted

## 2013-08-19 NOTE — Telephone Encounter (Signed)
Monitor reviewed by dr Stanford Breed shows atrial fib with pvc's or aberrantly conducted beats.  Left message for pt to call

## 2013-08-22 ENCOUNTER — Telehealth: Payer: Self-pay | Admitting: *Deleted

## 2013-08-22 ENCOUNTER — Telehealth: Payer: Self-pay | Admitting: Physician Assistant

## 2013-08-22 NOTE — Telephone Encounter (Signed)
Patient requests eliquis samples. I will place at the front desk for pick up. 

## 2013-08-22 NOTE — Telephone Encounter (Addendum)
I tried an additional time to contact the patient and also his emergency contacts without success to make sure the patient has safely made it to the ER, since he has not yet registered in any of the [facility] departments. I got no response. I called [employer] and was forwarded to [employer] to request a wellness visit to make sure he is OK. They will be sending someone out as soon as possible to check on him. I have also passed on this info to the oncoming cardiologist. Melina Copa PA-C

## 2013-08-22 NOTE — Telephone Encounter (Signed)
Received page that patient called to let us know he was on the way to the hospital. Attempted to call pt back x 2 but no answer, LMOM to let him know I received his message and to call back if needed. Simmie Camerer PA-C

## 2013-08-25 ENCOUNTER — Telehealth: Payer: Self-pay | Admitting: *Deleted

## 2013-08-25 ENCOUNTER — Telehealth: Payer: Self-pay | Admitting: Cardiology

## 2013-08-25 NOTE — Telephone Encounter (Signed)
Per telephone call from Hinton Dyer who was on call this weekend - states she had received a call telling her this pt was on the way to the ED but he never showed up.  She was not able to contact him.  She would like for [place] to attempt to contact him to make sure he is OK.  Spoke with pt who stated he is not sure how things got confused but he was never going to the ED - he came to the office to pick up medications but that was all.  He does state he was very SOB and may have sounded like he needed to go there but that was never his intention.

## 2013-08-25 NOTE — Telephone Encounter (Signed)
pt aware of results  

## 2013-08-25 NOTE — Telephone Encounter (Signed)
New Message:  PT is requesting a call back with his holter monitor results.

## 2013-08-25 NOTE — Telephone Encounter (Signed)
Spoke with pt, aware of monitor results. 

## 2013-09-03 ENCOUNTER — Telehealth: Payer: Self-pay | Admitting: Family

## 2013-09-03 ENCOUNTER — Other Ambulatory Visit: Payer: Self-pay | Admitting: Cardiology

## 2013-09-03 ENCOUNTER — Telehealth: Payer: Self-pay | Admitting: Cardiology

## 2013-09-03 ENCOUNTER — Ambulatory Visit (HOSPITAL_BASED_OUTPATIENT_CLINIC_OR_DEPARTMENT_OTHER)
Admission: RE | Admit: 2013-09-03 | Discharge: 2013-09-03 | Disposition: A | Discharge status: Home / Self Care | Payer: Medicare PPO | Source: Ambulatory Visit | Attending: Cardiology | Admitting: Cardiology

## 2013-09-03 ENCOUNTER — Other Ambulatory Visit: Payer: Self-pay | Admitting: *Deleted

## 2013-09-03 ENCOUNTER — Ambulatory Visit (INDEPENDENT_AMBULATORY_CARE_PROVIDER_SITE_OTHER): Payer: Medicare PPO | Admitting: Cardiology

## 2013-09-03 ENCOUNTER — Encounter: Payer: Self-pay | Admitting: Cardiology

## 2013-09-03 VITALS — BP 124/86 | HR 78 | Ht 71.0 in | Wt 236.0 lb

## 2013-09-03 DIAGNOSIS — I714 Abdominal aortic aneurysm, without rupture, unspecified: Secondary | ICD-10-CM

## 2013-09-03 DIAGNOSIS — J449 Chronic obstructive pulmonary disease, unspecified: Secondary | ICD-10-CM | POA: Insufficient documentation

## 2013-09-03 DIAGNOSIS — R0609 Other forms of dyspnea: Secondary | ICD-10-CM

## 2013-09-03 DIAGNOSIS — J4489 Other specified chronic obstructive pulmonary disease: Secondary | ICD-10-CM | POA: Insufficient documentation

## 2013-09-03 DIAGNOSIS — R0989 Other specified symptoms and signs involving the circulatory and respiratory systems: Secondary | ICD-10-CM

## 2013-09-03 DIAGNOSIS — R069 Unspecified abnormalities of breathing: Secondary | ICD-10-CM

## 2013-09-03 LAB — BASIC METABOLIC PANEL WITH GFR
BUN: 23 mg/dL (ref 6–23)
CALCIUM: 9 mg/dL (ref 8.4–10.5)
CHLORIDE: 103 meq/L (ref 96–112)
CO2: 27 meq/L (ref 19–32)
Creat: 1.38 mg/dL — ABNORMAL HIGH (ref 0.50–1.35)
GFR, Est African American: 59 mL/min — ABNORMAL LOW
GFR, Est Non African American: 51 mL/min — ABNORMAL LOW
Glucose, Bld: 94 mg/dL (ref 70–99)
Potassium: 3.6 mEq/L (ref 3.5–5.3)
SODIUM: 138 meq/L (ref 135–145)

## 2013-09-03 MED ORDER — ISOSORBIDE DINITRATE 20 MG PO TABS: ORAL_TABLET | ORAL | Status: DC

## 2013-09-03 NOTE — Assessment & Plan Note (Signed)
Continue statin. 

## 2013-09-03 NOTE — Telephone Encounter (Signed)
Spoke with pt, CTA to be scheduled in [place] once pre-cert is complete.

## 2013-09-03 NOTE — Telephone Encounter (Signed)
Patient is returning your call. Please call back.  °

## 2013-09-03 NOTE — Assessment & Plan Note (Signed)
Not on aspirin and the need for anticoagulation. Continue statin.

## 2013-09-03 NOTE — Assessment & Plan Note (Signed)
Continue present blood pressure medications. 

## 2013-09-03 NOTE — Patient Instructions (Signed)
Your physician wants you to follow-up in: [name] will receive a reminder letter in the mail two months in advance. If you don't receive a letter, please call our office to schedule the follow-up appointment.   A chest x-ray takes a picture of the organs and structures inside the chest, including the heart, lungs, and blood vessels. This test can show several things, including, whether the heart is enlarges; whether fluid is building up in the lungs; and whether pacemaker / defibrillator leads are still in place.   Your physician has requested that you have an abdominal aorta duplex. During this test, an ultrasound is used to evaluate the aorta. Allow 30 minutes for this exam. Do not eat after midnight the day before and avoid carbonated beverages TODAY AT 10:30 AM  Your physician recommends that you HAVE LAB WORK TODAY

## 2013-09-03 NOTE — Assessment & Plan Note (Signed)
Repeat ultrasound ?

## 2013-09-03 NOTE — Assessment & Plan Note (Addendum)
Dyspnea persists but he attributes this to deconditioning. Heart rate is controlled. LV function normal. Not volume overloaded on examination. Continue present medications. Check chest x-ray. If dyspnea persists we will consider pulmonary functions in the future. Continue present dose of Lasix. Check potassium and renal function.

## 2013-09-03 NOTE — Progress Notes (Signed)
HPI: FU atrial fibrillation, coronary disease, hypertension and hyperlipidemia. The patient's cardiac history dates back to 2006 when he had PCI of his LAD and first diagonal. Note his LV function was normal. He also had a subtotal of his PDA. Patient had repair of AAA in Jan 2011. Myovue in July of 2014 showed ejection fraction 69% and normal perfusion. Echocardiogram in June of 2014 showed normal LV function, mild left ventricular hypertrophy, mild aortic and mitral regurgitation and biatrial enlargement. Patient also with h/o atrial fibrillation treated with rate control and anticoagulation. Patient last seen in March 2015 and was complaining of increased dyspnea. Laboratories in March showed a hemoglobin of 13.9, BUN and creatinine of 26 and 1.48 and BNP 306. Holter monitor in April of 2015 showed atrial fibrillation with PVCs or aberrantly conducted beats. Ventricular rate controlled. Triamterene hydrochlorothiazide discontinued and Lasix 40 mg daily initiated. Since then, He has some dyspnea with moderate activities. No orthopnea, PND, chest pain, palpitations, syncope or pedal edema.   Current Outpatient Prescriptions  Medication Sig Dispense Refill  . amLODipine (NORVASC) 10 MG tablet Take 1 tablet (10 mg total) by mouth daily.  90 tablet  3  . apixaban (ELIQUIS) 5 MG TABS tablet Take 1 tablet (5 mg total) by mouth 2 (two) times daily.  60 tablet  12  . Coenzyme Q10 (COQ-10) 100 MG CAPS Take 200 mg by mouth daily.       . furosemide (LASIX) 40 MG tablet Take 1 tablet (40 mg total) by mouth daily.  90 tablet  3  . isosorbide dinitrate (ISORDIL) 20 MG tablet TAKE 1 TABLET THREE TIMES DAILY  270 tablet  1  . losartan (COZAAR) 50 MG tablet TAKE 1 TABLET EVERY DAY  90 tablet  1  . metoprolol (LOPRESSOR) 100 MG tablet TAKE 1 TABLET TWICE DAILY  180 tablet  1  . Multiple Vitamin (MULTIVITAMIN) tablet Take 1 tablet by mouth daily.        . Nutritional Supplements (MELATONIN PO) Take by mouth  daily as needed.       . Omega-3 Fatty Acids (FISH OIL) 1200 MG CAPS Take 1,200 mg by mouth 2 (two) times daily.       . pravastatin (PRAVACHOL) 80 MG tablet Take 1 tablet (80 mg total) by mouth daily.  90 tablet  0  . ranitidine (ZANTAC) 150 MG tablet Take 1 tablet (150 mg total) by mouth 2 (two) times daily.  180 tablet  1  . sertraline (ZOLOFT) 100 MG tablet TAKE 1 TABLET EVERY DAY  90 tablet  1  . tamsulosin (FLOMAX) 0.4 MG CAPS capsule Take 1 capsule (0.4 mg total) by mouth daily.  90 capsule  1   No current facility-administered medications for this visit.     Past Medical History  Diagnosis Date  . Hypertension   . Hyperlipidemia   . CAD (coronary artery disease)   . History of nephrolithiasis   . GERD (gastroesophageal reflux disease)   . Depression   . History of skin cancer   . History of pneumothorax   . AAA (abdominal aortic aneurysm)     9.1 cm repair 05/2009  . Atrial fibrillation     Past Surgical History  Procedure Laterality Date  . Cardiac catheterization  06/2004  . Abdominal aortic aneurysm repair    . Tonsillectomy    . Athroscopic knee surgery      History   Social History  . Marital Status: Divorced  Spouse Name: N/A    Number of Children: 2  . Years of Education: N/A   Occupational History  .     Social History Main Topics  . Smoking status: Former Smoker    Quit date: 08/24/1980  . Smokeless tobacco: Never Used     Comment: quit   . Alcohol Use: 1.5 oz/week    3 drink(s) per week     Comment: Occasional  . Drug Use: No  . Sexual Activity: Not on file   Other Topics Concern  . Not on file   Social History Narrative   Retired   Divorced   2 sons 55 &30     Occupation:  Part time sports Tax adviser    Smoking Status:  quit (08/24/1980)   Packs/Day:  1.0    Caffeine use/day:  3 beverages daily    Does Patient Exercise:  no   Alcohol Use - yes    ROS: no fevers or chills, productive cough, hemoptysis, dysphasia, odynophagia,  melena, hematochezia, dysuria, hematuria, rash, seizure activity, orthopnea, PND, pedal edema, claudication. Remaining systems are negative.  Physical Exam: Well-developed well-nourished in no acute distress.  Skin is warm and dry.  HEENT is normal.  Neck is supple.  Chest is clear to auscultation with normal expansion.  Cardiovascular exam is irregular Abdominal exam nontender or distended. No masses palpated. Extremities show trace edema. neuro grossly intact

## 2013-09-03 NOTE — Assessment & Plan Note (Signed)
- 

## 2013-09-05 NOTE — Telephone Encounter (Signed)
Pre-cert complete.  CTA scheduled for 09-08-13 @ 8:30 am in [place] Patient voiced understanding to hold lasix sat, sun and Monday Will need to have labs checked the end of the week.

## 2013-09-08 ENCOUNTER — Encounter (HOSPITAL_BASED_OUTPATIENT_CLINIC_OR_DEPARTMENT_OTHER): Payer: Self-pay

## 2013-09-08 ENCOUNTER — Ambulatory Visit (HOSPITAL_BASED_OUTPATIENT_CLINIC_OR_DEPARTMENT_OTHER)
Admission: RE | Admit: 2013-09-08 | Discharge: 2013-09-08 | Disposition: A | Discharge status: Home / Self Care | Payer: Medicare PPO | Source: Ambulatory Visit | Attending: Cardiology | Admitting: Cardiology

## 2013-09-08 DIAGNOSIS — Z9889 Other specified postprocedural states: Secondary | ICD-10-CM | POA: Insufficient documentation

## 2013-09-08 DIAGNOSIS — I723 Aneurysm of iliac artery: Secondary | ICD-10-CM | POA: Insufficient documentation

## 2013-09-08 DIAGNOSIS — D4959 Neoplasm of unspecified behavior of other genitourinary organ: Secondary | ICD-10-CM | POA: Insufficient documentation

## 2013-09-08 DIAGNOSIS — I714 Abdominal aortic aneurysm, without rupture, unspecified: Secondary | ICD-10-CM

## 2013-09-08 MED ORDER — IOHEXOL 350 MG/ML SOLN
100.0000 mL | Freq: Once | INTRAVENOUS | Status: AC | PRN
  Administered 2013-09-08: 100 mL via INTRAVENOUS

## 2013-09-09 ENCOUNTER — Other Ambulatory Visit: Payer: Self-pay | Admitting: *Deleted

## 2013-09-09 DIAGNOSIS — N2889 Other specified disorders of kidney and ureter: Secondary | ICD-10-CM

## 2013-09-09 DIAGNOSIS — N259 Disorder resulting from impaired renal tubular function, unspecified: Secondary | ICD-10-CM

## 2013-09-09 DIAGNOSIS — I714 Abdominal aortic aneurysm, without rupture, unspecified: Secondary | ICD-10-CM

## 2013-09-11 ENCOUNTER — Other Ambulatory Visit: Payer: Self-pay | Admitting: *Deleted

## 2013-09-11 MED ORDER — ISOSORBIDE DINITRATE 20 MG PO TABS: ORAL_TABLET | ORAL | Status: DC

## 2013-09-15 LAB — BASIC METABOLIC PANEL WITH GFR
BUN: 20 mg/dL (ref 6–23)
CHLORIDE: 104 meq/L (ref 96–112)
CO2: 26 mEq/L (ref 19–32)
Calcium: 9.4 mg/dL (ref 8.4–10.5)
Creat: 1.32 mg/dL (ref 0.50–1.35)
GFR, EST NON AFRICAN AMERICAN: 54 mL/min — AB
GFR, Est African American: 63 mL/min
Glucose, Bld: 98 mg/dL (ref 70–99)
POTASSIUM: 3.8 meq/L (ref 3.5–5.3)
Sodium: 142 mEq/L (ref 135–145)

## 2013-09-19 ENCOUNTER — Encounter: Payer: Self-pay | Admitting: Surgery

## 2013-09-22 ENCOUNTER — Ambulatory Visit (INDEPENDENT_AMBULATORY_CARE_PROVIDER_SITE_OTHER): Payer: Medicare PPO | Admitting: Surgery

## 2013-09-22 ENCOUNTER — Encounter: Payer: Self-pay | Admitting: Surgery

## 2013-09-22 ENCOUNTER — Telehealth: Payer: Self-pay | Admitting: *Deleted

## 2013-09-22 VITALS — BP 148/92 | HR 76 | Resp 18 | Ht 71.0 in | Wt 236.7 lb

## 2013-09-22 DIAGNOSIS — I714 Abdominal aortic aneurysm, without rupture, unspecified: Secondary | ICD-10-CM

## 2013-09-22 NOTE — Telephone Encounter (Signed)
Spoke to patient re: renal mass on CT.   He tells me that spoke to Dr. Tresa Moore (urology) who is working with his scheduler to get the patient in for an appointment.  Advised pt to contact me if he has any problems getting in for an appointment and he verbalizes understanding.

## 2013-09-22 NOTE — Progress Notes (Signed)
   Patient name: Mark [name] MRN: [id number] DOB: 06/27/1940 Sex: male     Chief Complaint  Patient presents with  . Follow-up    FU EVAR 2011 by Dr. Donnabelle Blanchard ,  Dr. Crenshaw referring  enlarged aneursym     . AAA    HISTORY OF PRESENT ILLNESS: The patient comes in today for evaluation of his abdominal aortic aneurysm.  The last time I saw him was in 2011.  He is status post endovascular aneurysm repair for a 9.5 cm aneurysm on 06/03/2009.  His postoperative course was complicated by bilateral seromas.  One required drainage, but it recurred.  The patient had several CT scans which showed decrease in the size of his aneurysm.  It got down to approximately 7-1/2 cm.  He stopped following up.  He recently had a CT scan that showed an increase in the size of his aneurysm.  No definitive endoleak was identified.  He now has a left renal mass.  Past Medical History  Diagnosis Date  . Hypertension   . Hyperlipidemia   . CAD (coronary artery disease)   . History of nephrolithiasis   . GERD (gastroesophageal reflux disease)   . Depression   . History of skin cancer   . History of pneumothorax   . AAA (abdominal aortic aneurysm)     9.1 cm repair 05/2009  . Atrial fibrillation     Past Surgical History  Procedure Laterality Date  . Cardiac catheterization  06/2004  . Abdominal aortic aneurysm repair    . Tonsillectomy    . Athroscopic knee surgery      History   Social History  . Marital Status: Divorced    Spouse Name: N/A    Number of Children: 2  . Years of Education: N/A   Occupational History  .     Social History Main Topics  . Smoking status: Former Smoker    Quit date: 08/24/1980  . Smokeless tobacco: Never Used     Comment: quit   . Alcohol Use: 1.5 oz/week    3 drink(s) per week     Comment: Occasional  . Drug Use: No  . Sexual Activity: Not on file   Other Topics Concern  . Not on file   Social History Narrative   Retired   Divorced   2 sons 38  &30     Occupation:  Part time sports broadcaster    Smoking Status:  quit (08/24/1980)   Packs/Day:  1.0    Caffeine use/day:  3 beverages daily    Does Patient Exercise:  no   Alcohol Use - yes    Family History  Problem Relation Age of Onset  . Coronary artery disease    . Hyperlipidemia    . Hypertension    . Pulmonary embolism Mother     died of PE  . Prostate cancer    . Anuerysm Father     history of popliteal  . Other Neg Hx     polycystic kidney disease, and colon cancer  . Colon cancer Neg Hx     Allergies as of 09/22/2013 - Review Complete 09/22/2013  Allergen Reaction Noted  . Ace inhibitors      Current Outpatient Prescriptions on File Prior to Visit  Medication Sig Dispense Refill  . amLODipine (NORVASC) 10 MG tablet Take 1 tablet (10 mg total) by mouth daily.  90 tablet  3  . apixaban (ELIQUIS) 5 MG TABS tablet Take 1   tablet (5 mg total) by mouth 2 (two) times daily.  60 tablet  12  . Coenzyme Q10 (COQ-10) 100 MG CAPS Take 200 mg by mouth daily.       . furosemide (LASIX) 40 MG tablet Take 1 tablet (40 mg total) by mouth daily.  90 tablet  3  . isosorbide dinitrate (ISORDIL) 20 MG tablet TAKE 1 TABLET THREE TIMES DAILY  270 tablet  3  . losartan (COZAAR) 50 MG tablet TAKE 1 TABLET EVERY DAY  90 tablet  1  . metoprolol (LOPRESSOR) 100 MG tablet TAKE 1 TABLET TWICE DAILY  180 tablet  1  . Multiple Vitamin (MULTIVITAMIN) tablet Take 1 tablet by mouth daily.        . Nutritional Supplements (MELATONIN PO) Take by mouth daily as needed.       . Omega-3 Fatty Acids (FISH OIL) 1200 MG CAPS Take 1,200 mg by mouth 2 (two) times daily.       . pravastatin (PRAVACHOL) 80 MG tablet Take 1 tablet (80 mg total) by mouth daily.  90 tablet  0  . ranitidine (ZANTAC) 150 MG tablet Take 1 tablet (150 mg total) by mouth 2 (two) times daily.  180 tablet  1  . sertraline (ZOLOFT) 100 MG tablet TAKE 1 TABLET EVERY DAY  90 tablet  1  . tamsulosin (FLOMAX) 0.4 MG CAPS capsule Take 1  capsule (0.4 mg total) by mouth daily.  90 capsule  1   No current facility-administered medications on file prior to visit.     REVIEW OF SYSTEMS: Cardiovascular:  positive for shortness of breath with exertion  No history of DVT or phlebitis. Pulmonary: positive for productive cough . Neurologic: No weakness, paresthesias, aphasia, or amaurosis. No dizziness. Hematologic: No bleeding problems or clotting disorders. Musculoskeletal: No joint pain or joint swelling. Gastrointestinal: No blood in stool or hematemesis Genitourinary: No dysuria or hematuria. Psychiatric:: No history of major depression. Integumentary: No rashes or ulcers. Constitutional: No fever or chills.  PHYSICAL EXAMINATION:   Vital signs are BP 148/92  Pulse 76  Resp 18  Ht 5\' 11"  (1.803 m)  Wt 236 lb 11.2 oz (107.366 kg)  BMI 33.03 kg/m2 General: The patient appears their stated age. HEENT:  No gross abnormalities Pulmonary:  Non labored breathing Abdomen: Soft and non-tender Musculoskeletal: There are no major deformities. Neurologic: No focal weakness or paresthesias are detected, Skin: There are no ulcer or rashes noted. Psychiatric: The patient has normal affect. Cardiovascular: There is a regular rate and rhythm without significant murmur appreciated.Palpable femoral pulses.     Diagnostic Studies I have reviewed his CT scan.  There does appear to be contrast within the aneurysm sac.  No definitive leak is identified, however this may be coming from the inferior mesenteric artery.  The left limb is no longer in apposition to the wall distally, however it is approximately.  I do not think that this is the explanation for the increase in size of his aneurysm sac.  Assessment:  status post endovascular aneurysm repair with increase in size of the aneurysm, now measuring 9.5 cm  Plan:  while I do not see a definitive endoleak, I think this needs to be further evaluated, given the increase in size his  aneurysm.  I am scheduling him for abdominal angiography.  He has been on Eliquis, but he did not take it yesterday.  I'm going to schedule his angiogram for this Wednesday.  I will look for endoleak.  If  it is something that I can definitely visualized and intervened on I will.  My hope is that I find a source.  V. Wells Raeonna Milo IV, M.D. [facility] Office: [phone] Pager:  [phone]    

## 2013-09-22 NOTE — Telephone Encounter (Signed)
Eliquis samples left at front desk.

## 2013-09-23 ENCOUNTER — Other Ambulatory Visit: Payer: Self-pay | Admitting: Urology

## 2013-09-23 ENCOUNTER — Other Ambulatory Visit: Payer: Self-pay

## 2013-09-23 ENCOUNTER — Encounter (HOSPITAL_COMMUNITY): Payer: Self-pay | Admitting: Pharmacy Technician

## 2013-09-23 MED ORDER — SODIUM CHLORIDE 0.9 % IV SOLN
INTRAVENOUS | Status: DC
  Administered 2013-09-24: 13:00:00 via INTRAVENOUS

## 2013-09-23 MED ORDER — CEFAZOLIN SODIUM 1-5 GM-% IV SOLN: 1.0000 g | INTRAVENOUS | Status: DC

## 2013-09-24 ENCOUNTER — Encounter (HOSPITAL_COMMUNITY)
Admission: RE | Disposition: A | Discharge status: Home / Self Care | Payer: Self-pay | Source: Ambulatory Visit | Attending: Surgery

## 2013-09-24 ENCOUNTER — Ambulatory Visit (HOSPITAL_COMMUNITY)
Admission: RE | Admit: 2013-09-24 | Discharge: 2013-09-24 | Disposition: A | Discharge status: Home / Self Care | Payer: Medicare PPO | Source: Ambulatory Visit | Attending: Surgery | Admitting: Surgery

## 2013-09-24 ENCOUNTER — Other Ambulatory Visit: Payer: Self-pay | Admitting: *Deleted

## 2013-09-24 DIAGNOSIS — E785 Hyperlipidemia, unspecified: Secondary | ICD-10-CM | POA: Insufficient documentation

## 2013-09-24 DIAGNOSIS — I2510 Atherosclerotic heart disease of native coronary artery without angina pectoris: Secondary | ICD-10-CM | POA: Insufficient documentation

## 2013-09-24 DIAGNOSIS — I714 Abdominal aortic aneurysm, without rupture, unspecified: Secondary | ICD-10-CM

## 2013-09-24 DIAGNOSIS — I10 Essential (primary) hypertension: Secondary | ICD-10-CM | POA: Insufficient documentation

## 2013-09-24 DIAGNOSIS — K219 Gastro-esophageal reflux disease without esophagitis: Secondary | ICD-10-CM | POA: Insufficient documentation

## 2013-09-24 DIAGNOSIS — Z9889 Other specified postprocedural states: Secondary | ICD-10-CM | POA: Insufficient documentation

## 2013-09-24 DIAGNOSIS — F3289 Other specified depressive episodes: Secondary | ICD-10-CM | POA: Insufficient documentation

## 2013-09-24 DIAGNOSIS — F329 Major depressive disorder, single episode, unspecified: Secondary | ICD-10-CM | POA: Insufficient documentation

## 2013-09-24 DIAGNOSIS — T82330A Leakage of aortic (bifurcation) graft (replacement), initial encounter: Secondary | ICD-10-CM

## 2013-09-24 DIAGNOSIS — IMO0002 Reserved for concepts with insufficient information to code with codable children: Secondary | ICD-10-CM

## 2013-09-24 DIAGNOSIS — I4891 Unspecified atrial fibrillation: Secondary | ICD-10-CM | POA: Insufficient documentation

## 2013-09-24 DIAGNOSIS — Z87891 Personal history of nicotine dependence: Secondary | ICD-10-CM | POA: Insufficient documentation

## 2013-09-24 HISTORY — PX: ABDOMINAL AORTAGRAM: SHX5454

## 2013-09-24 LAB — POCT I-STAT, CHEM 8
BUN: 28 mg/dL — ABNORMAL HIGH (ref 6–23)
Calcium, Ion: 1.18 mmol/L (ref 1.13–1.30)
Chloride: 106 mEq/L (ref 96–112)
Creatinine, Ser: 1.7 mg/dL — ABNORMAL HIGH (ref 0.50–1.35)
Glucose, Bld: 85 mg/dL (ref 70–99)
HEMATOCRIT: 38 % — AB (ref 39.0–52.0)
HEMOGLOBIN: 12.9 g/dL — AB (ref 13.0–17.0)
POTASSIUM: 3.4 meq/L — AB (ref 3.7–5.3)
SODIUM: 142 meq/L (ref 137–147)
TCO2: 20 mmol/L (ref 0–100)

## 2013-09-24 LAB — PROTIME-INR
INR: 1.13 (ref 0.00–1.49)
PROTHROMBIN TIME: 14.3 s (ref 11.6–15.2)

## 2013-09-24 SURGERY — ABDOMINAL AORTAGRAM
Anesthesia: LOCAL

## 2013-09-24 MED ORDER — ACETAMINOPHEN 325 MG PO TABS: 325.0000 mg | ORAL_TABLET | ORAL | Status: DC | PRN

## 2013-09-24 MED ORDER — HEPARIN (PORCINE) IN NACL 2-0.9 UNIT/ML-% IJ SOLN
INTRAMUSCULAR | Status: AC
Start: 2013-09-24 — End: 2013-09-24
  Filled 2013-09-24: qty 1000

## 2013-09-24 MED ORDER — HYDRALAZINE HCL 20 MG/ML IJ SOLN: 10.0000 mg | INTRAMUSCULAR | Status: DC | PRN

## 2013-09-24 MED ORDER — ACETAMINOPHEN 325 MG RE SUPP: 325.0000 mg | RECTAL | Status: DC | PRN

## 2013-09-24 MED ORDER — LABETALOL HCL 5 MG/ML IV SOLN: 10.0000 mg | INTRAVENOUS | Status: DC | PRN

## 2013-09-24 MED ORDER — METOPROLOL TARTRATE 1 MG/ML IV SOLN: 2.0000 mg | INTRAVENOUS | Status: DC | PRN

## 2013-09-24 MED ORDER — ONDANSETRON HCL 4 MG/2ML IJ SOLN: 4.0000 mg | Freq: Four times a day (QID) | INTRAMUSCULAR | Status: DC | PRN

## 2013-09-24 MED ORDER — LIDOCAINE HCL (PF) 1 % IJ SOLN
INTRAMUSCULAR | Status: AC
  Filled 2013-09-24: qty 30

## 2013-09-24 MED ORDER — ALUM & MAG HYDROXIDE-SIMETH 200-200-20 MG/5ML PO SUSP: 15.0000 mL | ORAL | Status: DC | PRN

## 2013-09-24 MED ORDER — SODIUM CHLORIDE 0.9 % IV SOLN: 1.0000 mL/kg/h | INTRAVENOUS | Status: DC

## 2013-09-24 MED ORDER — PHENOL 1.4 % MT LIQD: 1.0000 | OROMUCOSAL | Status: DC | PRN

## 2013-09-24 MED ORDER — GUAIFENESIN-DM 100-10 MG/5ML PO SYRP: 15.0000 mL | ORAL_SOLUTION | ORAL | Status: DC | PRN

## 2013-09-24 MED ORDER — OXYCODONE HCL 5 MG PO TABS: 5.0000 mg | ORAL_TABLET | ORAL | Status: DC | PRN

## 2013-09-24 SURGICAL SUPPLY — 53 items
BANDAGE ELASTIC 4 VELCRO ST LF (GAUZE/BANDAGES/DRESSINGS) IMPLANT
BANDAGE ESMARK 6X9 LF (GAUZE/BANDAGES/DRESSINGS) IMPLANT
BNDG ESMARK 6X9 LF (GAUZE/BANDAGES/DRESSINGS)
CANISTER SUCTION 2500CC (MISCELLANEOUS) ×2 IMPLANT
CLIP TI MEDIUM 24 (CLIP) ×2 IMPLANT
CLIP TI WIDE RED SMALL 24 (CLIP) ×2 IMPLANT
COVER SURGICAL LIGHT HANDLE (MISCELLANEOUS) ×2 IMPLANT
CUFF TOURNIQUET SINGLE 24IN (TOURNIQUET CUFF) IMPLANT
CUFF TOURNIQUET SINGLE 34IN LL (TOURNIQUET CUFF) IMPLANT
CUFF TOURNIQUET SINGLE 44IN (TOURNIQUET CUFF) IMPLANT
DERMABOND ADVANCED (GAUZE/BANDAGES/DRESSINGS) ×1
DERMABOND ADVANCED .7 DNX12 (GAUZE/BANDAGES/DRESSINGS) ×1 IMPLANT
DRAIN CHANNEL 15F RND FF W/TCR (WOUND CARE) IMPLANT
DRAPE WARM FLUID 44X44 (DRAPE) ×2 IMPLANT
DRAPE X-RAY CASS 24X20 (DRAPES) IMPLANT
DRSG COVADERM 4X10 (GAUZE/BANDAGES/DRESSINGS) IMPLANT
DRSG COVADERM 4X8 (GAUZE/BANDAGES/DRESSINGS) IMPLANT
ELECT REM PT RETURN 9FT ADLT (ELECTROSURGICAL) ×2
ELECTRODE REM PT RTRN 9FT ADLT (ELECTROSURGICAL) ×1 IMPLANT
EVACUATOR SILICONE 100CC (DRAIN) IMPLANT
GLOVE BIOGEL PI IND STRL 7.5 (GLOVE) ×1 IMPLANT
GLOVE BIOGEL PI INDICATOR 7.5 (GLOVE) ×1
GLOVE SURG SS PI 7.5 STRL IVOR (GLOVE) ×2 IMPLANT
GOWN PREVENTION PLUS XXLARGE (GOWN DISPOSABLE) ×2 IMPLANT
GOWN STRL NON-REIN LRG LVL3 (GOWN DISPOSABLE) ×6 IMPLANT
HEMOSTAT SNOW SURGICEL 2X4 (HEMOSTASIS) IMPLANT
KIT BASIN OR (CUSTOM PROCEDURE TRAY) ×2 IMPLANT
KIT ROOM TURNOVER OR (KITS) ×2 IMPLANT
MARKER GRAFT CORONARY BYPASS (MISCELLANEOUS) IMPLANT
NS IRRIG 1000ML POUR BTL (IV SOLUTION) ×4 IMPLANT
PACK PERIPHERAL VASCULAR (CUSTOM PROCEDURE TRAY) ×2 IMPLANT
PAD ARMBOARD 7.5X6 YLW CONV (MISCELLANEOUS) ×4 IMPLANT
PADDING CAST COTTON 6X4 STRL (CAST SUPPLIES) IMPLANT
SET COLLECT BLD 21X3/4 12 (NEEDLE) IMPLANT
STOPCOCK 4 WAY LG BORE MALE ST (IV SETS) IMPLANT
SUT ETHILON 3 0 PS 1 (SUTURE) IMPLANT
SUT PROLENE 5 0 C 1 24 (SUTURE) ×2 IMPLANT
SUT PROLENE 6 0 BV (SUTURE) ×2 IMPLANT
SUT PROLENE 7 0 BV 1 (SUTURE) IMPLANT
SUT SILK 2 0 SH (SUTURE) ×2 IMPLANT
SUT SILK 3 0 (SUTURE)
SUT SILK 3-0 18XBRD TIE 12 (SUTURE) IMPLANT
SUT VIC AB 2-0 CT1 27 (SUTURE) ×2
SUT VIC AB 2-0 CT1 TAPERPNT 27 (SUTURE) ×2 IMPLANT
SUT VIC AB 3-0 SH 27 (SUTURE) ×2
SUT VIC AB 3-0 SH 27X BRD (SUTURE) ×2 IMPLANT
SUT VICRYL 4-0 PS2 18IN ABS (SUTURE) ×4 IMPLANT
TOWEL OR 17X24 6PK STRL BLUE (TOWEL DISPOSABLE) ×4 IMPLANT
TOWEL OR 17X26 10 PK STRL BLUE (TOWEL DISPOSABLE) ×4 IMPLANT
TRAY FOLEY CATH 16FRSI W/METER (SET/KITS/TRAYS/PACK) ×2 IMPLANT
TUBING EXTENTION W/L.L. (IV SETS) IMPLANT
UNDERPAD 30X30 INCONTINENT (UNDERPADS AND DIAPERS) ×2 IMPLANT
WATER STERILE IRR 1000ML POUR (IV SOLUTION) ×2 IMPLANT

## 2013-09-24 NOTE — Op Note (Addendum)
    Patient name: Mark [name] MRN: [id number] DOB: 11/30/1940 Sex: male  09/24/2013 Pre-operative Diagnosis: Enlarging abdominal aortic aneurysm status post endovascular aneurysm repair Post-operative diagnosis:  Same Surgeon:  Serafina Mitchell Procedure Performed:  1.  ultrasound-guided access left femoral artery  2.  abdominal aortogram  3.  first order catheterization (superior mesenteric artery)  4.  superior mesenteric artery angiogram  5.  first order catheterization (left hypogastric artery)  6.  pelvic angiogram   Indications:  The patient underwent endovascular aneurysm repair of a 9.5 cm aneurysm several years ago.  His most recent CT scan dated back to 2011 where his aneurysm had decreased to 7 cm.  He was lost to followup.  A recent CT scan showed a large aneurysm, now measuring 9.6 cm.  No definitive endoleak was identified.  He is here for further evaluation.  Procedure:  The patient was identified in the holding area and taken to room 8.  The patient was then placed supine on the table and prepped and draped in the usual sterile fashion.  A time out was called.  Ultrasound was used to evaluate the left common femoral artery.  It was patent .  A digital ultrasound image was acquired.  A micropuncture needle was used to access the left common femoral artery under ultrasound guidance.  An 018 wire was advanced without resistance and a micropuncture sheath was placed.  The 018 wire was removed and a benson wire was placed.  The micropuncture sheath was exchanged for a 5 french sheath.  An omniflush catheter was advanced over the wire to the level of L-1.  An abdominal angiogram was obtained.  Next, I used a SOS catheter to select the superior mesenteric artery, and a superior mesenteric artery angiogram was performed.  Next, the catheter was used to select the left hypogastric artery and a left hypogastric artery/pelvic antegrade was performed.  Finally, the Omni flush catheter was  advanced above the celiac artery and a repeat abdominal aortogram was performed  Findings:   Aortogram:  Celiac artery in its main branches are widely patent.  The superior mesenteric artery is widely patent.  No evidence of renal artery stenosis.  An abdominal aortic stent graft is visualized without obvious complication.  Bilateral external iliac and hypogastric arteries are widely patent.  Superior mesenteric artery:  The main trunk of the superior mesenteric artery and its branches are widely patent.  There is a dark of real and which opacifies late.  Off of this the inferior mesenteric artery is identified going up to the aneurysm sac.  Left hypogastric:  Distal branches of the hypogastric artery leak 2 lumbar arteries which do opacify at the aneurysm sac.  Intervention:  None  Impression:  #1  patent endovascular abdominal aortic stent graft.  #2  the aortic branches in the visceral section are widely patent.  #3  there is the possibility of a endoleak from the inferior mesenteric artery which is visualized off of the arc of real and on the superior mesenteric artery injections.  #4  another possibility for the endoleak is a lumbar artery off of the left hypogastric artery.   Theotis Burrow, M.D. [facility] Office: [phone] Pager:  [phone]

## 2013-09-24 NOTE — Discharge Instructions (Signed)

## 2013-09-24 NOTE — Progress Notes (Signed)
Dr. Trula Slade paged and informed that patient does not have anyone to stay overnight with him and that he refuses to be admitted overnight. Dr. Trula Slade states that he is okay to be discharged to home with self.

## 2013-09-25 ENCOUNTER — Other Ambulatory Visit: Payer: Self-pay | Admitting: *Deleted

## 2013-09-25 DIAGNOSIS — T82330A Leakage of aortic (bifurcation) graft (replacement), initial encounter: Secondary | ICD-10-CM

## 2013-09-25 DIAGNOSIS — IMO0002 Reserved for concepts with insufficient information to code with codable children: Secondary | ICD-10-CM

## 2013-09-25 NOTE — Interval H&P Note (Signed)
History and Physical Interval Note:  09/25/2013 9:42 PM  Mark [name]  has presented today for surgery, with the diagnosis of PVD  The various methods of treatment have been discussed with the patient and family. After consideration of risks, benefits and other options for treatment, the patient has consented to  Procedure(s): ABDOMINAL AORTAGRAM (N/A) as a surgical intervention .  The patient's history has been reviewed, patient examined, no change in status, stable for surgery.  I have reviewed the patient's chart and labs.  Questions were answered to the patient's satisfaction.     Serafina Mitchell

## 2013-09-25 NOTE — H&P (View-Only) (Signed)
Patient name: Mark [name] MRN: [id number] DOB: 01-05-1941 Sex: male     Chief Complaint  Patient presents with  . Follow-up    [facility] 2011 by Dr. Trula Slade ,  Dr. Stanford Breed referring  enlarged aneursym     . AAA    HISTORY OF PRESENT ILLNESS: The patient comes in today for evaluation of his abdominal aortic aneurysm.  The last time I saw him was in 2011.  He is status post endovascular aneurysm repair for a 9.5 cm aneurysm on 06/03/2009.  His postoperative course was complicated by bilateral seromas.  One required drainage, but it recurred.  The patient had several CT scans which showed decrease in the size of his aneurysm.  It got down to approximately 7-1/2 cm.  He stopped following up.  He recently had a CT scan that showed an increase in the size of his aneurysm.  No definitive endoleak was identified.  He now has a left renal mass.  Past Medical History  Diagnosis Date  . Hypertension   . Hyperlipidemia   . CAD (coronary artery disease)   . History of nephrolithiasis   . GERD (gastroesophageal reflux disease)   . Depression   . History of skin cancer   . History of pneumothorax   . AAA (abdominal aortic aneurysm)     9.1 cm repair 05/2009  . Atrial fibrillation     Past Surgical History  Procedure Laterality Date  . Cardiac catheterization  06/2004  . Abdominal aortic aneurysm repair    . Tonsillectomy    . Athroscopic knee surgery      History   Social History  . Marital Status: Divorced    Spouse Name: N/A    Number of Children: 2  . Years of Education: N/A   Occupational History  .     Social History Main Topics  . Smoking status: Former Smoker    Quit date: 08/24/1980  . Smokeless tobacco: Never Used     Comment: quit   . Alcohol Use: 1.5 oz/week    3 drink(s) per week     Comment: Occasional  . Drug Use: No  . Sexual Activity: Not on file   Other Topics Concern  . Not on file   Social History Narrative   Retired   Divorced   2 sons 22  &30     Occupation:  Part time sports Tax adviser    Smoking Status:  quit (08/24/1980)   Packs/Day:  1.0    Caffeine use/day:  3 beverages daily    Does Patient Exercise:  no   Alcohol Use - yes    Family History  Problem Relation Age of Onset  . Coronary artery disease    . Hyperlipidemia    . Hypertension    . Pulmonary embolism Mother     died of PE  . Prostate cancer    . Anuerysm Father     history of popliteal  . Other Neg Hx     polycystic kidney disease, and colon cancer  . Colon cancer Neg Hx     Allergies as of 09/22/2013 - Review Complete 09/22/2013  Allergen Reaction Noted  . Ace inhibitors      Current Outpatient Prescriptions on File Prior to Visit  Medication Sig Dispense Refill  . amLODipine (NORVASC) 10 MG tablet Take 1 tablet (10 mg total) by mouth daily.  90 tablet  3  . apixaban (ELIQUIS) 5 MG TABS tablet Take 1  tablet (5 mg total) by mouth 2 (two) times daily.  60 tablet  12  . Coenzyme Q10 (COQ-10) 100 MG CAPS Take 200 mg by mouth daily.       . furosemide (LASIX) 40 MG tablet Take 1 tablet (40 mg total) by mouth daily.  90 tablet  3  . isosorbide dinitrate (ISORDIL) 20 MG tablet TAKE 1 TABLET THREE TIMES DAILY  270 tablet  3  . losartan (COZAAR) 50 MG tablet TAKE 1 TABLET EVERY DAY  90 tablet  1  . metoprolol (LOPRESSOR) 100 MG tablet TAKE 1 TABLET TWICE DAILY  180 tablet  1  . Multiple Vitamin (MULTIVITAMIN) tablet Take 1 tablet by mouth daily.        . Nutritional Supplements (MELATONIN PO) Take by mouth daily as needed.       . Omega-3 Fatty Acids (FISH OIL) 1200 MG CAPS Take 1,200 mg by mouth 2 (two) times daily.       . pravastatin (PRAVACHOL) 80 MG tablet Take 1 tablet (80 mg total) by mouth daily.  90 tablet  0  . ranitidine (ZANTAC) 150 MG tablet Take 1 tablet (150 mg total) by mouth 2 (two) times daily.  180 tablet  1  . sertraline (ZOLOFT) 100 MG tablet TAKE 1 TABLET EVERY DAY  90 tablet  1  . tamsulosin (FLOMAX) 0.4 MG CAPS capsule Take 1  capsule (0.4 mg total) by mouth daily.  90 capsule  1   No current facility-administered medications on file prior to visit.     REVIEW OF SYSTEMS: Cardiovascular:  positive for shortness of breath with exertion  No history of DVT or phlebitis. Pulmonary: positive for productive cough . Neurologic: No weakness, paresthesias, aphasia, or amaurosis. No dizziness. Hematologic: No bleeding problems or clotting disorders. Musculoskeletal: No joint pain or joint swelling. Gastrointestinal: No blood in stool or hematemesis Genitourinary: No dysuria or hematuria. Psychiatric:: No history of major depression. Integumentary: No rashes or ulcers. Constitutional: No fever or chills.  PHYSICAL EXAMINATION:   Vital signs are BP 148/92  Pulse 76  Resp 18  Ht 5\' 11"  (1.803 m)  Wt 236 lb 11.2 oz (107.366 kg)  BMI 33.03 kg/m2 General: The patient appears their stated age. HEENT:  No gross abnormalities Pulmonary:  Non labored breathing Abdomen: Soft and non-tender Musculoskeletal: There are no major deformities. Neurologic: No focal weakness or paresthesias are detected, Skin: There are no ulcer or rashes noted. Psychiatric: The patient has normal affect. Cardiovascular: There is a regular rate and rhythm without significant murmur appreciated.Palpable femoral pulses.     Diagnostic Studies I have reviewed his CT scan.  There does appear to be contrast within the aneurysm sac.  No definitive leak is identified, however this may be coming from the inferior mesenteric artery.  The left limb is no longer in apposition to the wall distally, however it is approximately.  I do not think that this is the explanation for the increase in size of his aneurysm sac.  Assessment:  status post endovascular aneurysm repair with increase in size of the aneurysm, now measuring 9.5 cm  Plan:  while I do not see a definitive endoleak, I think this needs to be further evaluated, given the increase in size his  aneurysm.  I am scheduling him for abdominal angiography.  He has been on Eliquis, but he did not take it yesterday.  I'm going to schedule his angiogram for this Wednesday.  I will look for endoleak.  If  it is something that I can definitely visualized and intervened on I will.  My hope is that I find a source.  Eldridge Abrahams, M.D. [facility] Office: [phone] Pager:  [phone]

## 2013-09-26 ENCOUNTER — Telehealth: Payer: Self-pay | Admitting: Surgery

## 2013-09-26 NOTE — Telephone Encounter (Addendum)
Message copied by Gena Fray on Fri Sep 26, 2013  4:20 PM ------      Message from: [place], [place] K      Created: Wed Sep 24, 2013  3:29 PM      Regarding: Schedule                   ----- Message -----         From: Serafina Mitchell, MD         Sent: 09/24/2013   2:57 PM           To: [facility] Charge Pool            09/24/2013:            Surgeon:  Serafina Mitchell      Procedure Performed:       1.  ultrasound-guided access left femoral artery       2.  abdominal aortogram       3.  first order catheterization (superior mesenteric artery)       4.  superior mesenteric artery angiogram       5.  first order catheterization (left hypogastric artery)       6.  pelvic angiogram            The patient needs to see Dr. Kathlene Cote with interventional radiology next week for evaluation of endoleak management.  I discussed this with him via telephone this afternoon.  Please contact the patient with the time and date of his appointment. ------  09/26/13: spoke with Tammy at IR- she had Zigmund Daniel put in order. She has attempted to call patient- LM for him to cb to schedule, dpm

## 2013-09-30 ENCOUNTER — Ambulatory Visit
Admission: RE | Admit: 2013-09-30 | Discharge: 2013-09-30 | Disposition: A | Discharge status: Home / Self Care | Payer: Medicare PPO | Source: Ambulatory Visit | Attending: Surgery | Admitting: Surgery

## 2013-09-30 DIAGNOSIS — T82330A Leakage of aortic (bifurcation) graft (replacement), initial encounter: Secondary | ICD-10-CM

## 2013-09-30 DIAGNOSIS — IMO0002 Reserved for concepts with insufficient information to code with codable children: Secondary | ICD-10-CM

## 2013-10-07 ENCOUNTER — Other Ambulatory Visit (HOSPITAL_COMMUNITY): Payer: Self-pay | Admitting: Interventional Radiology

## 2013-10-07 DIAGNOSIS — I714 Abdominal aortic aneurysm, without rupture, unspecified: Secondary | ICD-10-CM

## 2013-10-10 ENCOUNTER — Other Ambulatory Visit: Payer: Self-pay | Admitting: Radiology

## 2013-10-10 ENCOUNTER — Encounter (HOSPITAL_COMMUNITY): Payer: Self-pay | Admitting: Pharmacy Technician

## 2013-10-13 ENCOUNTER — Encounter (HOSPITAL_COMMUNITY): Payer: Self-pay

## 2013-10-13 ENCOUNTER — Other Ambulatory Visit (HOSPITAL_COMMUNITY): Payer: Self-pay | Admitting: Interventional Radiology

## 2013-10-13 ENCOUNTER — Telehealth: Payer: Self-pay

## 2013-10-13 ENCOUNTER — Ambulatory Visit (HOSPITAL_COMMUNITY)
Admission: RE | Admit: 2013-10-13 | Discharge: 2013-10-13 | Disposition: A | Discharge status: Home / Self Care | Payer: Medicare PPO | Source: Ambulatory Visit | Attending: Interventional Radiology | Admitting: Interventional Radiology

## 2013-10-13 VITALS — BP 149/89 | HR 92 | Temp 97.8°F | Resp 16 | Ht 71.0 in | Wt 228.0 lb

## 2013-10-13 DIAGNOSIS — I714 Abdominal aortic aneurysm, without rupture, unspecified: Secondary | ICD-10-CM | POA: Insufficient documentation

## 2013-10-13 DIAGNOSIS — I2510 Atherosclerotic heart disease of native coronary artery without angina pectoris: Secondary | ICD-10-CM | POA: Insufficient documentation

## 2013-10-13 DIAGNOSIS — I4891 Unspecified atrial fibrillation: Secondary | ICD-10-CM | POA: Insufficient documentation

## 2013-10-13 DIAGNOSIS — K219 Gastro-esophageal reflux disease without esophagitis: Secondary | ICD-10-CM | POA: Insufficient documentation

## 2013-10-13 DIAGNOSIS — F329 Major depressive disorder, single episode, unspecified: Secondary | ICD-10-CM | POA: Insufficient documentation

## 2013-10-13 DIAGNOSIS — Z85828 Personal history of other malignant neoplasm of skin: Secondary | ICD-10-CM | POA: Insufficient documentation

## 2013-10-13 DIAGNOSIS — I10 Essential (primary) hypertension: Secondary | ICD-10-CM | POA: Insufficient documentation

## 2013-10-13 DIAGNOSIS — E785 Hyperlipidemia, unspecified: Secondary | ICD-10-CM | POA: Insufficient documentation

## 2013-10-13 DIAGNOSIS — Z87891 Personal history of nicotine dependence: Secondary | ICD-10-CM | POA: Insufficient documentation

## 2013-10-13 DIAGNOSIS — F3289 Other specified depressive episodes: Secondary | ICD-10-CM | POA: Insufficient documentation

## 2013-10-13 LAB — COMPREHENSIVE METABOLIC PANEL
ALT: 8 U/L (ref 0–53)
AST: 19 U/L (ref 0–37)
Albumin: 3.6 g/dL (ref 3.5–5.2)
Alkaline Phosphatase: 69 U/L (ref 39–117)
BUN: 21 mg/dL (ref 6–23)
CALCIUM: 9.4 mg/dL (ref 8.4–10.5)
CHLORIDE: 105 meq/L (ref 96–112)
CO2: 23 meq/L (ref 19–32)
CREATININE: 1.59 mg/dL — AB (ref 0.50–1.35)
GFR calc Af Amer: 49 mL/min — ABNORMAL LOW (ref >90)
GFR, EST NON AFRICAN AMERICAN: 42 mL/min — AB (ref >90)
Glucose, Bld: 98 mg/dL (ref 70–99)
Potassium: 3.6 mEq/L — ABNORMAL LOW (ref 3.7–5.3)
SODIUM: 144 meq/L (ref 137–147)
Total Bilirubin: 0.8 mg/dL (ref 0.3–1.2)
Total Protein: 7.2 g/dL (ref 6.0–8.3)

## 2013-10-13 LAB — CBC
HCT: 40.7 % (ref 39.0–52.0)
Hemoglobin: 13.8 g/dL (ref 13.0–17.0)
MCH: 30.2 pg (ref 26.0–34.0)
MCHC: 33.9 g/dL (ref 30.0–36.0)
MCV: 89.1 fL (ref 78.0–100.0)
Platelets: 150 10*3/uL (ref 150–400)
RBC: 4.57 MIL/uL (ref 4.22–5.81)
RDW: 14.7 % (ref 11.5–15.5)
WBC: 6.8 10*3/uL (ref 4.0–10.5)

## 2013-10-13 LAB — PROTIME-INR
INR: 1.02 (ref 0.00–1.49)
Prothrombin Time: 13.2 seconds (ref 11.6–15.2)

## 2013-10-13 LAB — APTT: aPTT: 32 seconds (ref 24–37)

## 2013-10-13 MED ORDER — FENTANYL CITRATE 0.05 MG/ML IJ SOLN
INTRAMUSCULAR | Status: AC
  Filled 2013-10-13: qty 4

## 2013-10-13 MED ORDER — HYDRALAZINE HCL 20 MG/ML IJ SOLN
INTRAMUSCULAR | Status: AC
  Filled 2013-10-13: qty 1

## 2013-10-13 MED ORDER — MIDAZOLAM HCL 2 MG/2ML IJ SOLN
INTRAMUSCULAR | Status: AC
  Filled 2013-10-13: qty 2

## 2013-10-13 MED ORDER — SODIUM CHLORIDE 0.9 % IV SOLN
INTRAVENOUS | Status: DC
  Administered 2013-10-13: 10:00:00 via INTRAVENOUS

## 2013-10-13 MED ORDER — CEFAZOLIN SODIUM-DEXTROSE 2-3 GM-% IV SOLR
2.0000 g | Freq: Once | INTRAVENOUS | Status: AC
  Administered 2013-10-13: 2 g via INTRAVENOUS

## 2013-10-13 MED ORDER — HYDRALAZINE HCL 20 MG/ML IJ SOLN
INTRAMUSCULAR | Status: AC | PRN
Start: 2013-10-13 — End: 2013-10-13
  Administered 2013-10-13 (×2): 10 mg via INTRAVENOUS

## 2013-10-13 MED ORDER — MIDAZOLAM HCL 2 MG/2ML IJ SOLN
INTRAMUSCULAR | Status: AC | PRN
  Administered 2013-10-13: 1 mg via INTRAVENOUS
  Administered 2013-10-13: 0.5 mg via INTRAVENOUS

## 2013-10-13 MED ORDER — IOHEXOL 300 MG/ML  SOLN
150.0000 mL | Freq: Once | INTRAMUSCULAR | Status: AC | PRN
  Administered 2013-10-13: 1 mL via INTRAVENOUS

## 2013-10-13 MED ORDER — MIDAZOLAM HCL 2 MG/2ML IJ SOLN
INTRAMUSCULAR | Status: AC
  Filled 2013-10-13: qty 4

## 2013-10-13 MED ORDER — FENTANYL CITRATE 0.05 MG/ML IJ SOLN
INTRAMUSCULAR | Status: AC | PRN
  Administered 2013-10-13 (×2): 25 ug via INTRAVENOUS

## 2013-10-13 MED ORDER — SODIUM CHLORIDE 0.9 % IV SOLN: INTRAVENOUS | Status: DC

## 2013-10-13 NOTE — Sedation Documentation (Addendum)
Sheath pulled and manual pressure held by Michela Pitcher, RT while Dr. Kathlene Cote s/w  Pt.

## 2013-10-13 NOTE — H&P (Signed)
Agree.  For angio with attempt at endoleak repair.  Will plan to try to retrograde cath IMA via SMA collateral and embolize sac and/or IMA trunk.  Will hydrate for mild renal insufficiency.

## 2013-10-13 NOTE — Sedation Documentation (Signed)
MD at bedside s/w pt regarding procedure outcome and recommendations.  Questions answered

## 2013-10-13 NOTE — Sedation Documentation (Signed)
NS decreased to 100cc/hr per Dr. Kathlene Cote.  Bp improving.

## 2013-10-13 NOTE — Sedation Documentation (Signed)
Pt requested and used urinal while lying flat on table, no difficulty

## 2013-10-13 NOTE — Sedation Documentation (Signed)
NS infusing at 125cc/hr per Dr. Kathlene Cote

## 2013-10-13 NOTE — Procedures (Signed)
Procedure:  Visceral arteriography and right internal iliac arteriography Access:  Right CFA Findings:  SMA catheterized and angio shows collateral supply to IMA trunk.  Microcatheter advanced through collateral to proximal IMA trunk.  IMA trunk terminates in arterial supply to region of spine/spinal column as well as branch to bowel.  No supply to active endoleak identified. Right hypogastric angio shows collaterals reconsituting 2 separate right lumbar arteries, but no endoleak supply identified. Plan:  Manual compression and 4 hour recovery.  Will discuss case w/ Dr. Trula Slade.

## 2013-10-13 NOTE — Sedation Documentation (Signed)
Discussed pt's increasing Bp w/ Dr. Kathlene Cote and pt has not taken Norvasc or Cozaar since yesterday, took Metoprolol and Lasix early this am.  Order given for 10mg  Apresoline IV now.  Pt resting comfortably, eyes closed.

## 2013-10-13 NOTE — Sedation Documentation (Signed)
Patient is resting comfortably.  Occasional snoring, mouth breathing noted.  Awakens easily.

## 2013-10-13 NOTE — H&P (Signed)
Mark [name] is an 73 y.o. male.   Chief Complaint: Pt has had history of abdominal aorta aneurysm repair 2011 Was followed without issue until recently when pt was having evaluation of L renal mass. [place] was performed and found incidental enlarging aortic aneurysmal sac.  CTA 09/08/13 confirmed this finding. Arteriography 09/24/13 revealed patent endograft. 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 Also noted was opacification of lumbar arteries that appear to opacify aneurysmal sac. 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  HPI: HTN; HLD; CAD; GERD; skin ca; AAA- repair 2011; A fib (off Eloquis)  Past Medical History  Diagnosis Date  . Hypertension   . Hyperlipidemia   . CAD (coronary artery disease)   . History of nephrolithiasis   . GERD (gastroesophageal reflux disease)   . Depression   . History of skin cancer   . History of pneumothorax   . AAA (abdominal aortic aneurysm)     9.1 cm repair 05/2009  . Atrial fibrillation     Past Surgical History  Procedure Laterality Date  . Cardiac catheterization  06/2004  . Abdominal aortic aneurysm repair    . Tonsillectomy    . Athroscopic knee surgery      Family History  Problem Relation Age of Onset  . Coronary artery disease    . Hyperlipidemia    . Hypertension    . Pulmonary embolism Mother     died of PE  . Prostate cancer    . Anuerysm Father     history of popliteal  . Other Neg Hx     polycystic kidney disease, and colon cancer  . Colon cancer Neg Hx    Social History:  reports that he quit smoking about 33 years ago. He has never used smokeless tobacco. He reports that he drinks about 1.5 ounces of alcohol per week. He reports that he does not use illicit drugs.  Allergies:   Allergies  Allergen Reactions  . Ace Inhibitors     REACTION: Cough     (Not in a hospital admission)  Results for orders placed during the hospital encounter of 10/13/13 (from the past 48 hour(s))  CBC     Status: None   Collection Time    10/13/13 10:09 AM      Result Value Ref Range   WBC 6.8  4.0 - 10.5 K/uL   RBC 4.57  4.22 - 5.81 MIL/uL   Hemoglobin 13.8  13.0 - 17.0 g/dL   HCT 40.7  39.0 - 52.0 %   MCV 89.1  78.0 - 100.0 fL   MCH 30.2  26.0 - 34.0 pg   MCHC 33.9  30.0 - 36.0 g/dL   RDW 14.7  11.5 - 15.5 %   Platelets 150  150 - 400 K/uL   No results found.  Review of Systems  Constitutional: Negative for fever and weight loss.  HENT: Negative for hearing loss.   Respiratory: Positive for shortness of breath.        With stairs  Cardiovascular: Negative for chest pain.  Gastrointestinal: Negative for nausea, vomiting and abdominal pain.  Musculoskeletal: Negative for back pain.  Neurological: Negative for dizziness and weakness.  Psychiatric/Behavioral: Negative for substance abuse.    Blood pressure 118/87, pulse 85, temperature 97.8 F (36.6 C), temperature source Oral, resp. rate  20, height 5\' 11"  (1.803 m), weight 103.42 kg (228 lb), SpO2 94.00%. Physical Exam  Constitutional: He is oriented to person, place, and time. He appears well-nourished.  Cardiovascular:  No murmur heard. irreg  Respiratory: Effort normal and breath sounds normal. He has no wheezes.  GI: Soft. Bowel sounds are normal. There is no tenderness.  Musculoskeletal: Normal range of motion.  Neurological: He is alert and oriented to person, place, and time.  Skin: Skin is warm and dry.  Psychiatric: He has a normal mood and affect. His behavior is normal. Judgment and thought content normal.     Assessment/Plan AAA repair 2011 No issue til recent eval of L renal mass revealed incidental enlarging aortic aneurysmal sac Consulted with Dr Kathlene Cote at Dr Trula Slade request Now scheduled  for arteriography of SMA; poss arteriography of iliac arteries with probable aortic endoleak embolization. Pt and family aware of procedure benefits and risks and agreeable to proceed Consent signed andin chart   [name] 10/13/2013, 10:44 AM

## 2013-10-13 NOTE — Telephone Encounter (Signed)
Patient came to office to get samples of eliquis gave them to him at the front desk

## 2013-10-13 NOTE — Sedation Documentation (Signed)
Pt c/o slight pain during procedure.

## 2013-10-13 NOTE — Discharge Instructions (Signed)
Angiography, Care After ° °Refer to this sheet in the next few weeks. These instructions provide you with information on caring for yourself after your procedure. Your health care provider may also give you more specific instructions. Your treatment has been planned according to current medical practices, but problems sometimes occur. Call your health care provider if you have any problems or questions after your procedure.  °WHAT TO EXPECT AFTER THE PROCEDURE °After your procedure, it is typical to have the following sensations: °· Minor discomfort or tenderness and a small bump at the catheter insertion site. The bump should usually decrease in size and tenderness within 1 to 2 weeks. °· Any bruising will usually fade within 2 to 4 weeks. °HOME CARE INSTRUCTIONS  °· You may need to keep taking blood thinners if they were prescribed for you. Only take over-the-counter or prescription medicines for pain, fever, or discomfort as directed by your health care provider. °· Do not apply powder or lotion to the site. °· Do not sit in a bathtub, swimming pool, or whirlpool for 5 to 7 days. °· You may shower 24 hours after the procedure. Remove the bandage (dressing) and gently wash the site with plain soap and water. Gently pat the site dry. °· Inspect the site at least twice daily. °· Limit your activity for the first 24 hours. Do not bend, squat, or lift anything over 10 lb (9 kg) or as directed by your health care provider. °· Do not drive home if you are discharged the day of the procedure. Have someone else drive you. Follow instructions about when you can drive or return to work. °SEEK MEDICAL CARE IF: °· You get lightheaded when standing up. °· You have drainage (other than a small amount of blood on the dressing). °· You have chills. °· You have a fever. °· You have redness, warmth, swelling, or pain at the insertion site. °SEEK IMMEDIATE MEDICAL CARE IF:  °· You develop chest pain or shortness of breath, feel  faint, or pass out. °· You have bleeding, swelling larger than a walnut, or drainage from the catheter insertion site. °· You develop pain, discoloration, coldness, or severe bruising in the leg or arm that held the catheter. °· You have heavy bleeding from the site. If this happens, hold pressure on the site. °MAKE SURE YOU: °· Understand these instructions. °· Will watch your condition. °· Will get help right away if you are not doing well or get worse. °Document Released: 11/10/2004 Document Revised: 12/25/2012 Document Reviewed: 09/16/2012 °[employer]® Patient Information ©2014 [employer]. ° °

## 2013-10-14 ENCOUNTER — Ambulatory Visit: Payer: Medicare PPO | Admitting: Family

## 2013-10-14 ENCOUNTER — Telehealth: Payer: Self-pay | Admitting: Family

## 2013-10-14 NOTE — Telephone Encounter (Signed)
Patient wanted to cancel appointment for this morning stating that he had a procedure last night and did not feel well enough to come in today. He states that he will be having his kidney removed later this month or early July and will reschedule after that is done.

## 2013-10-14 NOTE — Telephone Encounter (Signed)
OK to waive No Show fee please.

## 2013-10-14 NOTE — Telephone Encounter (Signed)
FYI

## 2013-10-24 ENCOUNTER — Encounter (HOSPITAL_COMMUNITY): Payer: Self-pay | Admitting: Pharmacy Technician

## 2013-10-29 ENCOUNTER — Ambulatory Visit
Admission: RE | Admit: 2013-10-29 | Discharge: 2013-10-29 | Disposition: A | Discharge status: Home / Self Care | Payer: Medicare PPO | Source: Ambulatory Visit | Attending: Interventional Radiology | Admitting: Interventional Radiology

## 2013-10-29 ENCOUNTER — Ambulatory Visit (INDEPENDENT_AMBULATORY_CARE_PROVIDER_SITE_OTHER): Payer: Medicare PPO | Admitting: Physician Assistant

## 2013-10-29 ENCOUNTER — Other Ambulatory Visit (HOSPITAL_COMMUNITY): Payer: Self-pay | Admitting: Interventional Radiology

## 2013-10-29 ENCOUNTER — Encounter: Payer: Self-pay | Admitting: Physician Assistant

## 2013-10-29 VITALS — BP 122/100 | HR 81 | Temp 97.9°F | Resp 16 | Ht 71.0 in | Wt 225.5 lb

## 2013-10-29 DIAGNOSIS — R21 Rash and other nonspecific skin eruption: Secondary | ICD-10-CM

## 2013-10-29 DIAGNOSIS — B029 Zoster without complications: Secondary | ICD-10-CM

## 2013-10-29 MED ORDER — LIDOCAINE 5 % EX OINT: 1.0000 "application " | TOPICAL_OINTMENT | Freq: Two times a day (BID) | CUTANEOUS | Status: DC | PRN

## 2013-10-29 NOTE — Progress Notes (Signed)
Pre visit review using our clinic review tool, if applicable. No additional management support is needed unless otherwise documented below in the visit note/SLS  

## 2013-10-29 NOTE — Patient Instructions (Signed)
Unfortunately your shingles rash has been present for too long that you are outside the window where antivirals will be effective.  Apply topical Lidocaine (while wearing gloves) to the affected area.  Keep the area covered.  Use tylenol if needed for pain.  Please discuss the shingles with your surgeon in case they had to make extra precautions for your surgery.  Shingles Shingles (herpes zoster) is an infection that is caused by the same virus that causes chickenpox (varicella). The infection causes a painful skin rash and fluid-filled blisters, which eventually break open, crust over, and heal. It may occur in any area of the body, but it usually affects only one side of the body or face. The pain of shingles usually lasts about 1 month. However, some people with shingles may develop long-term (chronic) pain in the affected area of the body. Shingles often occurs many years after the person had chickenpox. It is more common:  In people older than 50 years.  In people with weakened immune systems, such as those with HIV, AIDS, or cancer.  In people taking medicines that weaken the immune system, such as transplant medicines.  In people under great stress. CAUSES  Shingles is caused by the varicella zoster virus (VZV), which also causes chickenpox. After a person is infected with the virus, it can remain in the person's body for years in an inactive state (dormant). To cause shingles, the virus reactivates and breaks out as an infection in a nerve root. The virus can be spread from person to person (contagious) through contact with open blisters of the shingles rash. It will only spread to people who have not had chickenpox. When these people are exposed to the virus, they may develop chickenpox. They will not develop shingles. Once the blisters scab over, the person is no longer contagious and cannot spread the virus to others. SYMPTOMS  Shingles shows up in stages. The initial symptoms may be  pain, itching, and tingling in an area of the skin. This pain is usually described as burning, stabbing, or throbbing.In a few days or weeks, a painful red rash will appear in the area where the pain, itching, and tingling were felt. The rash is usually on one side of the body in a band or belt-like pattern. Then, the rash usually turns into fluid-filled blisters. They will scab over and dry up in approximately 2-3 weeks. Flu-like symptoms may also occur with the initial symptoms, the rash, or the blisters. These may include:  Fever.  Chills.  Headache.  Upset stomach. DIAGNOSIS  Your caregiver will perform a skin exam to diagnose shingles. Skin scrapings or fluid samples may also be taken from the blisters. This sample will be examined under a microscope or sent to a lab for further testing. TREATMENT  There is no specific cure for shingles. Your caregiver will likely prescribe medicines to help you manage the pain, recover faster, and avoid long-term problems. This may include antiviral drugs, anti-inflammatory drugs, and pain medicines. HOME CARE INSTRUCTIONS   Take a cool bath or apply cool compresses to the area of the rash or blisters as directed. This may help with the pain and itching.   Only take over-the-counter or prescription medicines as directed by your caregiver.   Rest as directed by your caregiver.  Keep your rash and blisters clean with mild soap and cool water or as directed by your caregiver.  Do not pick your blisters or scratch your rash. Apply an anti-itch cream or  numbing creams to the affected area as directed by your caregiver.  Keep your shingles rash covered with a loose bandage (dressing).  Avoid skin contact with:  Babies.   Pregnant women.   Children with eczema.   Elderly people with transplants.   People with chronic illnesses, such as leukemia or AIDS.   Wear loose-fitting clothing to help ease the pain of material rubbing against the  rash.  Keep all follow-up appointments with your caregiver.If the area involved is on your face, you may receive a referral for follow-up to a specialist, such as an eye doctor (ophthalmologist) or an ear, nose, and throat (ENT) doctor. Keeping all follow-up appointments will help you avoid eye complications, chronic pain, or disability.  SEEK IMMEDIATE MEDICAL CARE IF:   You have facial pain, pain around the eye area, or loss of feeling on one side of your face.  You have ear pain or ringing in your ear.  You have loss of taste.  Your pain is not relieved with prescribed medicines.   Your redness or swelling spreads.   You have more pain and swelling.  Your condition is worsening or has changed.   You have a feveror persistent symptoms for more than 2-3 days.  You have a fever and your symptoms suddenly get worse. MAKE SURE YOU:  Understand these instructions.  Will watch your condition.  Will get help right away if you are not doing well or get worse. Document Released: 04/24/2005 Document Revised: 01/17/2012 Document Reviewed: 12/07/2011 [facility] Patient Information 2015 [place], [place]. This information is not intended to replace advice given to you by your health care provider. Make sure you discuss any questions you have with your health care provider.

## 2013-10-29 NOTE — Progress Notes (Signed)
Patient presents to clinic today c/o 10 days of a blistering, itchy and burning rash of right groin region that wraps around to his lower back. Patient has had chickenpox before. Denies fever, chills, malaise.  Has not needed anything for pain.  Has history of chicken pox.  Denies hx of shingles rash.  Past Medical History  Diagnosis Date  . Hypertension   . Hyperlipidemia   . CAD (coronary artery disease)   . History of nephrolithiasis   . GERD (gastroesophageal reflux disease)   . Depression   . History of skin cancer   . History of pneumothorax   . AAA (abdominal aortic aneurysm)     9.1 cm repair 05/2009  . Atrial fibrillation     Current Outpatient Prescriptions on File Prior to Visit  Medication Sig Dispense Refill  . amLODipine (NORVASC) 10 MG tablet Take 10 mg by mouth every evening.      Marland Kitchen apixaban (ELIQUIS) 5 MG TABS tablet Take 5 mg by mouth 2 (two) times daily.      . Aspirin-Acetaminophen-Caffeine (GOODY HEADACHE PO) Take 1 packet by mouth every 8 (eight) hours as needed (headache).      . Coenzyme Q10 200 MG capsule Take 200 mg by mouth daily.      . furosemide (LASIX) 40 MG tablet Take 40 mg by mouth daily with lunch.      . isosorbide dinitrate (ISORDIL) 20 MG tablet Take 20 mg by mouth 3 (three) times daily.      Marland Kitchen losartan (COZAAR) 50 MG tablet Take 50 mg by mouth every evening.       . metoprolol (LOPRESSOR) 100 MG tablet Take 100 mg by mouth 2 (two) times daily.      . Multiple Vitamin (MULTIVITAMIN) tablet Take 1 tablet by mouth daily.        . Omega-3 Fatty Acids (FISH OIL) 1200 MG CAPS Take 1,200 mg by mouth 2 (two) times daily.       . pravastatin (PRAVACHOL) 80 MG tablet Take 80 mg by mouth every evening.      . ranitidine (ZANTAC) 150 MG tablet Take 150 mg by mouth 2 (two) times daily.      . sertraline (ZOLOFT) 100 MG tablet Take 100 mg by mouth every evening.       . tamsulosin (FLOMAX) 0.4 MG CAPS capsule Take 0.4 mg by mouth daily after breakfast.        No current facility-administered medications on file prior to visit.    Allergies  Allergen Reactions  . Ace Inhibitors     REACTION: Cough    Family History  Problem Relation Age of Onset  . Coronary artery disease    . Hyperlipidemia    . Hypertension    . Pulmonary embolism Mother     died of PE  . Prostate cancer    . Anuerysm Father     history of popliteal  . Other Neg Hx     polycystic kidney disease, and colon cancer  . Colon cancer Neg Hx     History   Social History  . Marital Status: Divorced    Spouse Name: N/A    Number of Children: 2  . Years of Education: N/A   Occupational History  .     Social History Main Topics  . Smoking status: Former Smoker    Quit date: 08/24/1980  . Smokeless tobacco: Never Used     Comment: quit   . Alcohol Use:  1.5 oz/week    3 drink(s) per week     Comment: Occasional  . Drug Use: No  . Sexual Activity: None   Other Topics Concern  . None   Social History Narrative   Retired   Divorced   2 sons 34 &30     Occupation:  Part time sports broadcaster    Smoking Status:  quit (08/24/1980)   Packs/Day:  1.0    Caffeine use/day:  3 beverages daily    Does Patient Exercise:  no   Alcohol Use - yes   Review of Systems - See HPI.  All other ROS are negative.  BP 122/100  Pulse 81  Temp(Src) 97.9 F (36.6 C) (Oral)  Resp 16  Ht 5' 11"  (1.803 m)  Wt 225 lb 8 oz (102.286 kg)  BMI 31.46 kg/m2  SpO2 96%  Physical Exam  Vitals reviewed. Constitutional: He is oriented to person, place, and time and well-developed, well-nourished, and in no distress.  HENT:  Head: Normocephalic and atraumatic.  Cardiovascular: Normal rate, regular rhythm, normal heart sounds and intact distal pulses.   Pulmonary/Chest: Breath sounds normal. No respiratory distress. He has no wheezes. He has no rales. He exhibits no tenderness.  Neurological: He is alert and oriented to person, place, and time.  Skin:  Presence of a  papulovesicular, erythematous rash stretching from right lower back around into the right groin, following a dermatomal distribution.  Rash does not cross the midline.    Recent Results (from the past 2160 hour(s))  BASIC METABOLIC PANEL WITH GFR     Status: Abnormal   Collection Time    09/03/13  9:15 AM      Result Value Ref Range   Sodium 138  135 - 145 mEq/L   Potassium 3.6  3.5 - 5.3 mEq/L   Chloride 103  96 - 112 mEq/L   CO2 27  19 - 32 mEq/L   Glucose, Bld 94  70 - 99 mg/dL   BUN 23  6 - 23 mg/dL   Creat 1.38 (*) 0.50 - 1.35 mg/dL   Calcium 9.0  8.4 - 10.5 mg/dL   GFR, Est African American 59 (*)    GFR, Est Non African American 51 (*)    Comment:       The estimated GFR is a calculation valid for adults (>=26 years old)     that uses the CKD-EPI algorithm to adjust for age and sex. It is       not to be used for children, pregnant women, hospitalized patients,        patients on dialysis, or with rapidly changing kidney function.     According to the NKDEP, eGFR >89 is normal, 60-89 shows mild     impairment, 30-59 shows moderate impairment, 15-29 shows severe     impairment and <15 is ESRD.        BASIC METABOLIC PANEL WITH GFR     Status: Abnormal   Collection Time    09/15/13  8:17 AM      Result Value Ref Range   Sodium 142  135 - 145 mEq/L   Potassium 3.8  3.5 - 5.3 mEq/L   Chloride 104  96 - 112 mEq/L   CO2 26  19 - 32 mEq/L   Glucose, Bld 98  70 - 99 mg/dL   BUN 20  6 - 23 mg/dL   Creat 1.32  0.50 - 1.35 mg/dL   Calcium 9.4  8.4 - 10.5 mg/dL   GFR, Est African American 63     GFR, Est Non African American 54 (*)    Comment:       The estimated GFR is a calculation valid for adults (>=47 years old)     that uses the CKD-EPI algorithm to adjust for age and sex. It is       not to be used for children, pregnant women, hospitalized patients,        patients on dialysis, or with rapidly changing kidney function.     According to the NKDEP, eGFR >89 is normal,  60-89 shows mild     impairment, 30-59 shows moderate impairment, 15-29 shows severe     impairment and <15 is ESRD.        PROTIME-INR     Status: None   Collection Time    09/24/13  1:00 PM      Result Value Ref Range   Prothrombin Time 14.3  11.6 - 15.2 seconds   INR 1.13  0.00 - 1.49  POCT I-STAT, CHEM 8     Status: Abnormal   Collection Time    09/24/13  1:12 PM      Result Value Ref Range   Sodium 142  137 - 147 mEq/L   Potassium 3.4 (*) 3.7 - 5.3 mEq/L   Chloride 106  96 - 112 mEq/L   BUN 28 (*) 6 - 23 mg/dL   Creatinine, Ser 1.70 (*) 0.50 - 1.35 mg/dL   Glucose, Bld 85  70 - 99 mg/dL   Calcium, Ion 1.18  1.13 - 1.30 mmol/L   TCO2 20  0 - 100 mmol/L   Hemoglobin 12.9 (*) 13.0 - 17.0 g/dL   HCT 38.0 (*) 39.0 - 52.0 %  APTT     Status: None   Collection Time    10/13/13 10:09 AM      Result Value Ref Range   aPTT 32  24 - 37 seconds  CBC     Status: None   Collection Time    10/13/13 10:09 AM      Result Value Ref Range   WBC 6.8  4.0 - 10.5 K/uL   RBC 4.57  4.22 - 5.81 MIL/uL   Hemoglobin 13.8  13.0 - 17.0 g/dL   HCT 40.7  39.0 - 52.0 %   MCV 89.1  78.0 - 100.0 fL   MCH 30.2  26.0 - 34.0 pg   MCHC 33.9  30.0 - 36.0 g/dL   RDW 14.7  11.5 - 15.5 %   Platelets 150  150 - 400 K/uL  COMPREHENSIVE METABOLIC PANEL     Status: Abnormal   Collection Time    10/13/13 10:09 AM      Result Value Ref Range   Sodium 144  137 - 147 mEq/L   Potassium 3.6 (*) 3.7 - 5.3 mEq/L   Chloride 105  96 - 112 mEq/L   CO2 23  19 - 32 mEq/L   Glucose, Bld 98  70 - 99 mg/dL   BUN 21  6 - 23 mg/dL   Creatinine, Ser 1.59 (*) 0.50 - 1.35 mg/dL   Calcium 9.4  8.4 - 10.5 mg/dL   Total Protein 7.2  6.0 - 8.3 g/dL   Albumin 3.6  3.5 - 5.2 g/dL   AST 19  0 - 37 U/L   ALT 8  0 - 53 U/L   Alkaline Phosphatase 69  39 - 117 U/L  Total Bilirubin 0.8  0.3 - 1.2 mg/dL   GFR calc non Af Amer 42 (*) >90 mL/min   GFR calc Af Amer 49 (*) >90 mL/min   Comment: (NOTE)     The eGFR has been  calculated using the CKD EPI equation.     This calculation has not been validated in all clinical situations.     eGFR's persistently <90 mL/min signify possible Chronic Kidney     Disease.  PROTIME-INR     Status: None   Collection Time    10/13/13 10:09 AM      Result Value Ref Range   Prothrombin Time 13.2  11.6 - 15.2 seconds   INR 1.02  0.00 - 1.49    Assessment/Plan: Shingles Unfortunately patient is out of time frame where antiviral therapy will be beneficial.  Rash is already healing in multiple places.Rx Lidocaine ointment.  Tylenol for pain. Patient refuses stronger medication. Discussed contagious nature of rash.  Patient given instruction to keep vesicles covered.

## 2013-10-29 NOTE — Progress Notes (Signed)
Pt walked in office w/ rash extending from rt groin around to back region.  Dr Pascal Lux saw pt and recommended for pt to go to primary care for possible shingles or poison ivy.

## 2013-10-29 NOTE — Assessment & Plan Note (Signed)
Unfortunately patient is out of time frame where antiviral therapy will be beneficial.  Rash is already healing in multiple places.Rx Lidocaine ointment.  Tylenol for pain. Patient refuses stronger medication. Discussed contagious nature of rash.  Patient given instruction to keep vesicles covered.

## 2013-10-30 ENCOUNTER — Encounter (HOSPITAL_COMMUNITY)
Admission: RE | Admit: 2013-10-30 | Discharge: 2013-10-30 | Disposition: A | Discharge status: Home / Self Care | Payer: Medicare PPO | Source: Ambulatory Visit | Attending: Urology | Admitting: Urology

## 2013-10-30 ENCOUNTER — Encounter (HOSPITAL_COMMUNITY): Payer: Self-pay

## 2013-10-30 DIAGNOSIS — Z01818 Encounter for other preprocedural examination: Secondary | ICD-10-CM | POA: Insufficient documentation

## 2013-10-30 DIAGNOSIS — Z01812 Encounter for preprocedural laboratory examination: Secondary | ICD-10-CM | POA: Insufficient documentation

## 2013-10-30 HISTORY — DX: Unspecified osteoarthritis, unspecified site: M19.90

## 2013-10-30 HISTORY — DX: Zoster without complications: B02.9

## 2013-10-30 HISTORY — DX: Acute myocardial infarction, unspecified: I21.9

## 2013-10-30 LAB — BASIC METABOLIC PANEL
BUN: 25 mg/dL — AB (ref 6–23)
CALCIUM: 9 mg/dL (ref 8.4–10.5)
CO2: 26 mEq/L (ref 19–32)
CREATININE: 1.7 mg/dL — AB (ref 0.50–1.35)
Chloride: 102 mEq/L (ref 96–112)
GFR calc Af Amer: 45 mL/min — ABNORMAL LOW (ref >90)
GFR, EST NON AFRICAN AMERICAN: 39 mL/min — AB (ref >90)
GLUCOSE: 93 mg/dL (ref 70–99)
Potassium: 3.8 mEq/L (ref 3.7–5.3)
Sodium: 139 mEq/L (ref 137–147)

## 2013-10-30 LAB — PROTIME-INR
INR: 1.28 (ref 0.00–1.49)
Prothrombin Time: 16 seconds — ABNORMAL HIGH (ref 11.6–15.2)

## 2013-10-30 LAB — CBC
HEMATOCRIT: 38.3 % — AB (ref 39.0–52.0)
HEMOGLOBIN: 12.8 g/dL — AB (ref 13.0–17.0)
MCH: 29.9 pg (ref 26.0–34.0)
MCHC: 33.4 g/dL (ref 30.0–36.0)
MCV: 89.5 fL (ref 78.0–100.0)
Platelets: 153 10*3/uL (ref 150–400)
RBC: 4.28 MIL/uL (ref 4.22–5.81)
RDW: 14.6 % (ref 11.5–15.5)
WBC: 5 10*3/uL (ref 4.0–10.5)

## 2013-10-30 LAB — ABO/RH: ABO/RH(D): A POS

## 2013-10-30 LAB — APTT: APTT: 39 s — AB (ref 24–37)

## 2013-10-30 NOTE — Progress Notes (Signed)
10/30/13 1100  OBSTRUCTIVE SLEEP APNEA  Have you ever been diagnosed with sleep apnea through a sleep study? No  Do you snore loudly (loud enough to be heard through closed doors)?  0  Do you often feel tired, fatigued, or sleepy during the daytime? 1  Has anyone observed you stop breathing during your sleep? 0  Do you have, or are you being treated for high blood pressure? 1  BMI more than 35 kg/m2? 0  Age over 71 years old? 1  Neck circumference greater than 40 cm/16 inches? 1  Gender: 1  Obstructive Sleep Apnea Score 5  Score 4 or greater  Results sent to PCP

## 2013-10-30 NOTE — Patient Instructions (Addendum)
FOLLOW YOUR BOWEL PREP INSTRUCTIONS DAY BEFORE YOUR SURGERY - INSTRUCTIONS ARE FROM DR. MANNY'S OFFICE.  YOUR SURGERY IS SCHEDULED AT [facility]  ON:  Thursday  7/2  REPORT TO  SHORT STAY CENTER AT:  5:30 AM   PLEASE COME IN THE [facility] MAIN HOSPITAL ENTRANCE AND FOLLOW SIGNS TO SHORT STAY CENTER.  DO NOT EAT OR DRINK ANYTHING AFTER MIDNIGHT THE NIGHT BEFORE YOUR SURGERY.  YOU MAY BRUSH YOUR TEETH, RINSE OUT YOUR MOUTH--BUT NO WATER, NO FOOD, NO CHEWING GUM, NO MINTS, NO CANDIES, NO CHEWING TOBACCO.  PLEASE TAKE THE FOLLOWING MEDICATIONS THE AM OF YOUR SURGERY WITH A FEW SIPS OF WATER:  ISOSORBIDE, METOPROLOL, ZANTAC  I DO NOT BRING VALUABLES, MONEY, CREDIT CARDS.  DO NOT WEAR JEWELRY, MAKE-UP, NAIL POLISH AND NO METAL PINS OR CLIPS IN YOUR HAIR. CONTACT LENS, DENTURES / PARTIALS, GLASSES SHOULD NOT BE WORN TO SURGERY AND IN MOST CASES-HEARING AIDS WILL NEED TO BE REMOVED.  BRING YOUR GLASSES CASE, ANY EQUIPMENT NEEDED FOR YOUR CONTACT LENS. FOR PATIENTS ADMITTED TO THE HOSPITAL--CHECK OUT TIME THE DAY OF DISCHARGE IS 11:00 AM.  ALL INPATIENT ROOMS ARE PRIVATE - WITH BATHROOM, TELEPHONE, TELEVISION AND WIFI INTERNET.  OU AT HOME THE FIRST 24 HOURS AFTER SURGERY. RESPONSIBLE DRIVER'S NAME / PHONE                                                    PLEASE BE AWARE THAT YOU MAY NEED ADDITIONAL BLOOD DRAWN DAY OF YOUR SURGERY  _______________________________________________________________________   [facility] - Preparing for Surgery Before surgery, you can play an important role.  Because skin is not sterile, your skin needs to be as free of germs as possible.  You can reduce the number of germs on your skin by washing with CHG (chlorahexidine gluconate) soap before surgery.  CHG is an antiseptic cleaner which kills germs and bonds with the skin to continue killing germs even after washing. Please DO NOT use if you have an allergy to CHG or antibacterial soaps.  If your skin  becomes reddened/irritated stop using the CHG and inform your nurse when you arrive at Short Stay. Do not shave (including legs and underarms) for at least 48 hours prior to the first CHG shower.  You may shave your face/neck. Please follow these instructions carefully:  1.  Shower with CHG Soap the night before surgery and the  morning of Surgery.  2.  If you choose to wash your hair, wash your hair first as usual with your  normal  shampoo.  3.  After you shampoo, rinse your hair and body thoroughly to remove the  shampoo.                           4.  Use CHG as you would any other liquid soap.  You can apply chg directly  to the skin and wash                       Gently with a scrungie or clean washcloth.  5.  Apply the CHG Soap to your body ONLY FROM THE NECK DOWN.   Do not use on face/ open  Wound or open sores. Avoid contact with eyes, ears mouth and genitals (private parts).                       Wash face,  Genitals (private parts) with your normal soap.             6.  Wash thoroughly, paying special attention to the area where your surgery  will be performed.  7.  Thoroughly rinse your body with warm water from the neck down.  8.  DO NOT shower/wash with your normal soap after using and rinsing off  the CHG Soap.                9.  Pat yourself dry with a clean towel.            10.  Wear clean pajamas.            11.  Place clean sheets on your bed the night of your first shower and do not  sleep with pets. Day of Surgery : Do not apply any lotions/deodorants the morning of surgery.  Please wear clean clothes to the hospital/surgery center.  FAILURE TO FOLLOW THESE INSTRUCTIONS MAY RESULT IN THE CANCELLATION OF YOUR SURGERY PATIENT SIGNATURE_________________________________  NURSE SIGNATURE__________________________________  ________________________________________________________________________ [facility] - Preparing for Surgery Before surgery, you  can play an important role.  Because skin is not sterile, your skin needs to be as free of germs as possible.  You can reduce the number of germs on your skin by washing with CHG (chlorahexidine gluconate) soap before surgery.  CHG is an antiseptic cleaner which kills germs and bonds with the skin to continue killing germs even after washing. Please DO NOT use if you have an allergy to CHG or antibacterial soaps.  If your skin becomes reddened/irritated stop using the CHG and inform your nurse when you arrive at Short Stay. Do not shave (including legs and underarms) for at least 48 hours prior to the first CHG shower.  You may shave your face/neck. Please follow these instructions carefully:  1.  Shower with CHG Soap the night before surgery and the  morning of Surgery.  2.  If you choose to wash your hair, wash your hair first as usual with your  normal  shampoo.  3.  After you shampoo, rinse your hair and body thoroughly to remove the  shampoo.                           4.  Use CHG as you would any other liquid soap.  You can apply chg directly  to the skin and wash                       Gently with a scrungie or clean washcloth.  5.  Apply the CHG Soap to your body ONLY FROM THE NECK DOWN.   Do not use on face/ open                           Wound or open sores. Avoid contact with eyes, ears mouth and genitals (private parts).                       Wash face,  Genitals (private parts) with your normal soap.  6.  Wash thoroughly, paying special attention to the area where your surgery  will be performed.  7.  Thoroughly rinse your body with warm water from the neck down.  8.  DO NOT shower/wash with your normal soap after using and rinsing off  the CHG Soap.                9.  Pat yourself dry with a clean towel.            10.  Wear clean pajamas.            11.  Place clean sheets on your bed the night of your first shower and do not  sleep with pets. Day of Surgery : Do not apply  any lotions/deodorants the morning of surgery.  Please wear clean clothes to the hospital/surgery center.  FAILURE TO FOLLOW THESE INSTRUCTIONS MAY RESULT IN THE CANCELLATION OF YOUR SURGERY PATIENT SIGNATURE_________________________________  NURSE SIGNATURE__________________________________  ________________________________________________________________________  WHAT IS A BLOOD TRANSFUSION? Blood Transfusion Information  A transfusion is the replacement of blood or some of its parts. Blood is made up of multiple cells which provide different functions.  Red blood cells carry oxygen and are used for blood loss replacement.  White blood cells fight against infection.  Platelets control bleeding.  Plasma helps clot blood.  Other blood products are available for specialized needs, such as hemophilia or other clotting disorders. BEFORE THE TRANSFUSION  Who gives blood for transfusions?   Healthy volunteers who are fully evaluated to make sure their blood is safe. This is blood bank blood. Transfusion therapy is the safest it has ever been in the practice of medicine. Before blood is taken from a donor, a complete history is taken to make sure that person has no history of diseases nor engages in risky social behavior (examples are intravenous drug use or sexual activity with multiple partners). The donor's travel history is screened to minimize risk of transmitting infections, such as malaria. The donated blood is tested for signs of infectious diseases, such as HIV and hepatitis. The blood is then tested to be sure it is compatible with you in order to minimize the chance of a transfusion reaction. If you or a relative donates blood, this is often done in anticipation of surgery and is not appropriate for emergency situations. It takes many days to process the donated blood. RISKS AND COMPLICATIONS Although transfusion therapy is very safe and saves many lives, the main dangers of  transfusion include:   Getting an infectious disease.  Developing a transfusion reaction. This is an allergic reaction to something in the blood you were given. Every precaution is taken to prevent this. The decision to have a blood transfusion has been considered carefully by your caregiver before blood is given. Blood is not given unless the benefits outweigh the risks. AFTER THE TRANSFUSION  Right after receiving a blood transfusion, you will usually feel much better and more energetic. This is especially true if your red blood cells have gotten low (anemic). The transfusion raises the level of the red blood cells which carry oxygen, and this usually causes an energy increase.  The nurse administering the transfusion will monitor you carefully for complications. HOME CARE INSTRUCTIONS  No special instructions are needed after a transfusion. You may find your energy is better. Speak with your caregiver about any limitations on activity for underlying diseases you may have. SEEK MEDICAL CARE IF:   Your condition is not improving after your  transfusion.  You develop redness or irritation at the intravenous (IV) site. SEEK IMMEDIATE MEDICAL CARE IF:  Any of the following symptoms occur over the next 12 hours:  Shaking chills.  You have a temperature by mouth above 102 F (38.9 C), not controlled by medicine.  Chest, back, or muscle pain.  People around you feel you are not acting correctly or are confused.  Shortness of breath or difficulty breathing.  Dizziness and fainting.  You get a rash or develop hives.  You have a decrease in urine output.  Your urine turns a dark color or changes to pink, red, or brown. Any of the following symptoms occur over the next 10 days:  You have a temperature by mouth above 102 F (38.9 C), not controlled by medicine.  Shortness of breath.  Weakness after normal activity.  The white part of the eye turns yellow (jaundice).  You have a  decrease in the amount of urine or are urinating less often.  Your urine turns a dark color or changes to pink, red, or brown. Document Released: 04/21/2000 Document Revised: 07/17/2011 Document Reviewed: 12/09/2007 [facility] Patient Information 2014 [place], [place].  _______________________________________________________________________

## 2013-10-30 NOTE — Pre-Procedure Instructions (Addendum)
EKG REPORT 07-16-13 IN EPIC. 2-D ECHO REPORT 6-30/14 IN EPIC.   CXR REPORT IN EPIC FROM 09-03-13. CARDIOLOGY OFFICE NOTE DR. CRENSHAW IN EPIC FROM 09/03/13. PT STATES HE IS TO STOP HIS ELIQUIS 3 DAYS BEFORE HIS SURGERY. NOTIFIED SELITA AT DR. MANNY'S OFFICE OF PT'S SHINGLES RT GROIN AND RT TRUNK - PT STATES JUST DIAGNOSED AND DR. MANNY NOT AWARE.  [place] NOTIFIED DR. MANNY AND HE LEFT MESSAGE WITH SELITA THAT OK TO GO AHEAD WITH PT'S SURGERY - LARGE RENAL MASS THAT NEEDS REMOVING.

## 2013-11-03 ENCOUNTER — Telehealth: Payer: Self-pay | Admitting: *Deleted

## 2013-11-03 MED ORDER — PRAVASTATIN SODIUM 80 MG PO TABS: 80.0000 mg | ORAL_TABLET | Freq: Every evening | ORAL | Status: DC

## 2013-11-03 MED ORDER — LOSARTAN POTASSIUM 50 MG PO TABS: 50.0000 mg | ORAL_TABLET | Freq: Every evening | ORAL | Status: DC

## 2013-11-03 NOTE — Telephone Encounter (Signed)
Received fax requests from [facility] for pravastatin and losartan.  Refills sent for each, #90 x 1 refill.

## 2013-11-05 ENCOUNTER — Telehealth: Payer: Self-pay | Admitting: *Deleted

## 2013-11-05 ENCOUNTER — Telehealth: Payer: Self-pay | Admitting: Emergency Medicine

## 2013-11-05 MED ORDER — TAMSULOSIN HCL 0.4 MG PO CAPS: 0.4000 mg | ORAL_CAPSULE | Freq: Every day | ORAL | Status: DC

## 2013-11-05 NOTE — Telephone Encounter (Signed)
Received fax from [facility] for refill of tamsulosin. Refill sent.

## 2013-11-05 NOTE — Anesthesia Preprocedure Evaluation (Addendum)
Anesthesia Evaluation  Patient identified by MRN, date of birth, ID band Patient awake    Reviewed: Allergy & Precautions, H&P , NPO status , Patient's Chart, lab work & pertinent test results  Airway Mallampati: II TM Distance: >3 FB Neck ROM: Full    Dental  (+) Dental Advisory Given   Pulmonary shortness of breath, former smoker,  breath sounds clear to auscultation        Cardiovascular hypertension, Pt. on medications + CAD, + Past MI and + Peripheral Vascular Disease + dysrhythmias Atrial Fibrillation Rhythm:Regular Rate:Normal  Echo 10/2012 - Left ventricle: The cavity size was normal. Wall thickness  was increased in a pattern of mild LVH. There was mild  focal basal hypertrophy of the septum. Systolic function  was normal. The estimated ejection fraction was in the  range of 60% to 65%. Wall motion was normal; there were no  regional wall motion abnormalities. - Aortic valve: Mild regurgitation. - Mitral valve: Mild regurgitation. - Left atrium: The atrium was moderately dilated. - Right atrium: The atrium was mildly dilated.   Neuro/Psych PSYCHIATRIC DISORDERS Depression negative neurological ROS     GI/Hepatic Neg liver ROS, GERD-  Medicated,  Endo/Other  negative endocrine ROS  Renal/GU CRF and Renal InsufficiencyRenal disease     Musculoskeletal negative musculoskeletal ROS (+)   Abdominal   Peds  Hematology negative hematology ROS (+)   Anesthesia Other Findings   Reproductive/Obstetrics                          Anesthesia Physical Anesthesia Plan  ASA: III  Anesthesia Plan: General   Post-op Pain Management:    Induction: Intravenous  Airway Management Planned: Oral ETT  Additional Equipment:   Intra-op Plan:   Post-operative Plan: Extubation in OR  Informed Consent: I have reviewed the patients History and Physical, chart, labs and discussed the procedure including  the risks, benefits and alternatives for the proposed anesthesia with the patient or authorized representative who has indicated his/her understanding and acceptance.   Dental advisory given  Plan Discussed with: CRNA  Anesthesia Plan Comments:         Anesthesia Quick Evaluation

## 2013-11-05 NOTE — Telephone Encounter (Signed)
CALLED PT TO MAKE HIM AWARE THAT DR GY DOES NOT NEED TO F/U WITH HIM AT THE MOMENT.  WILL LEAVE THE F/U AND IMAGING TO DR BRABHAM.  TOLD PT TO CALL WHEN SCHEDULED FOR F/U ANGIO AND WOULD BE HAPPY TO SEE HIM AFTER.   PT HAVING LAP PARTIAL NEPHRECTOMY 11-06-13

## 2013-11-06 ENCOUNTER — Inpatient Hospital Stay (HOSPITAL_COMMUNITY): Payer: Medicare PPO | Admitting: Anesthesiology

## 2013-11-06 ENCOUNTER — Encounter (HOSPITAL_COMMUNITY)
Admission: RE | Disposition: A | Discharge status: Home / Self Care | Payer: Self-pay | Source: Ambulatory Visit | Attending: Urology

## 2013-11-06 ENCOUNTER — Encounter (HOSPITAL_COMMUNITY): Payer: Medicare PPO | Admitting: Anesthesiology

## 2013-11-06 ENCOUNTER — Inpatient Hospital Stay (HOSPITAL_COMMUNITY)
Admission: RE | Admit: 2013-11-06 | Discharge: 2013-11-08 | DRG: 658 | Disposition: A | Discharge status: Home / Self Care | Payer: Medicare PPO | Source: Ambulatory Visit | Attending: Urology | Admitting: Urology

## 2013-11-06 ENCOUNTER — Encounter (HOSPITAL_COMMUNITY): Payer: Self-pay

## 2013-11-06 ENCOUNTER — Telehealth: Payer: Self-pay | Admitting: Family

## 2013-11-06 DIAGNOSIS — I252 Old myocardial infarction: Secondary | ICD-10-CM | POA: Diagnosis not present

## 2013-11-06 DIAGNOSIS — Z87442 Personal history of urinary calculi: Secondary | ICD-10-CM | POA: Diagnosis not present

## 2013-11-06 DIAGNOSIS — Z85828 Personal history of other malignant neoplasm of skin: Secondary | ICD-10-CM

## 2013-11-06 DIAGNOSIS — E785 Hyperlipidemia, unspecified: Secondary | ICD-10-CM | POA: Diagnosis present

## 2013-11-06 DIAGNOSIS — Z87891 Personal history of nicotine dependence: Secondary | ICD-10-CM | POA: Diagnosis not present

## 2013-11-06 DIAGNOSIS — M129 Arthropathy, unspecified: Secondary | ICD-10-CM | POA: Diagnosis present

## 2013-11-06 DIAGNOSIS — I714 Abdominal aortic aneurysm, without rupture, unspecified: Secondary | ICD-10-CM | POA: Diagnosis present

## 2013-11-06 DIAGNOSIS — C649 Malignant neoplasm of unspecified kidney, except renal pelvis: Principal | ICD-10-CM | POA: Diagnosis present

## 2013-11-06 DIAGNOSIS — I4891 Unspecified atrial fibrillation: Secondary | ICD-10-CM | POA: Diagnosis present

## 2013-11-06 DIAGNOSIS — Z800 Family history of malignant neoplasm of digestive organs: Secondary | ICD-10-CM | POA: Diagnosis not present

## 2013-11-06 DIAGNOSIS — I129 Hypertensive chronic kidney disease with stage 1 through stage 4 chronic kidney disease, or unspecified chronic kidney disease: Secondary | ICD-10-CM | POA: Diagnosis present

## 2013-11-06 DIAGNOSIS — I2510 Atherosclerotic heart disease of native coronary artery without angina pectoris: Secondary | ICD-10-CM | POA: Diagnosis present

## 2013-11-06 DIAGNOSIS — N183 Chronic kidney disease, stage 3 unspecified: Secondary | ICD-10-CM | POA: Diagnosis present

## 2013-11-06 DIAGNOSIS — Z8042 Family history of malignant neoplasm of prostate: Secondary | ICD-10-CM | POA: Diagnosis not present

## 2013-11-06 DIAGNOSIS — Z79899 Other long term (current) drug therapy: Secondary | ICD-10-CM | POA: Diagnosis not present

## 2013-11-06 DIAGNOSIS — Z8271 Family history of polycystic kidney: Secondary | ICD-10-CM

## 2013-11-06 DIAGNOSIS — N289 Disorder of kidney and ureter, unspecified: Secondary | ICD-10-CM | POA: Diagnosis present

## 2013-11-06 DIAGNOSIS — Z888 Allergy status to other drugs, medicaments and biological substances status: Secondary | ICD-10-CM

## 2013-11-06 DIAGNOSIS — K219 Gastro-esophageal reflux disease without esophagitis: Secondary | ICD-10-CM | POA: Diagnosis present

## 2013-11-06 DIAGNOSIS — Z01812 Encounter for preprocedural laboratory examination: Secondary | ICD-10-CM

## 2013-11-06 DIAGNOSIS — Z8249 Family history of ischemic heart disease and other diseases of the circulatory system: Secondary | ICD-10-CM | POA: Diagnosis not present

## 2013-11-06 DIAGNOSIS — B029 Zoster without complications: Secondary | ICD-10-CM | POA: Diagnosis present

## 2013-11-06 HISTORY — PX: ROBOT ASSISTED LAPAROSCOPIC NEPHRECTOMY: SHX5140

## 2013-11-06 LAB — TYPE AND SCREEN
ABO/RH(D): A POS
Antibody Screen: NEGATIVE

## 2013-11-06 SURGERY — ROBOTIC ASSISTED LAPAROSCOPIC NEPHRECTOMY
Anesthesia: General | Laterality: Left

## 2013-11-06 MED ORDER — SENNA 8.6 MG PO TABS
1.0000 | ORAL_TABLET | Freq: Two times a day (BID) | ORAL | Status: DC
  Administered 2013-11-06 – 2013-11-08 (×5): 8.6 mg via ORAL
  Filled 2013-11-06 (×5): qty 1

## 2013-11-06 MED ORDER — SIMVASTATIN 40 MG PO TABS
40.0000 mg | ORAL_TABLET | Freq: Every day | ORAL | Status: DC
  Filled 2013-11-06: qty 1

## 2013-11-06 MED ORDER — HYDROMORPHONE HCL PF 1 MG/ML IJ SOLN
INTRAMUSCULAR | Status: AC
  Filled 2013-11-06: qty 1

## 2013-11-06 MED ORDER — DEXTROSE 5 % IV SOLN
INTRAVENOUS | Status: AC
  Filled 2013-11-06: qty 2

## 2013-11-06 MED ORDER — DEXTROSE-NACL 5-0.45 % IV SOLN
INTRAVENOUS | Status: DC
  Administered 2013-11-06 (×2): via INTRAVENOUS

## 2013-11-06 MED ORDER — DEXTROSE 5 % IV SOLN
1.0000 g | Freq: Two times a day (BID) | INTRAVENOUS | Status: DC
  Filled 2013-11-06: qty 10

## 2013-11-06 MED ORDER — METOPROLOL TARTRATE 100 MG PO TABS
100.0000 mg | ORAL_TABLET | Freq: Two times a day (BID) | ORAL | Status: DC
  Administered 2013-11-06 – 2013-11-08 (×4): 100 mg via ORAL
  Filled 2013-11-06 (×5): qty 1

## 2013-11-06 MED ORDER — FENTANYL CITRATE 0.05 MG/ML IJ SOLN
INTRAMUSCULAR | Status: AC
  Filled 2013-11-06: qty 5

## 2013-11-06 MED ORDER — OXYCODONE HCL 5 MG PO TABS
5.0000 mg | ORAL_TABLET | ORAL | Status: DC | PRN
  Administered 2013-11-06 – 2013-11-08 (×4): 5 mg via ORAL
  Filled 2013-11-06 (×4): qty 1

## 2013-11-06 MED ORDER — NEOSTIGMINE METHYLSULFATE 10 MG/10ML IV SOLN
INTRAVENOUS | Status: DC | PRN
  Administered 2013-11-06: 4 mg via INTRAVENOUS

## 2013-11-06 MED ORDER — HYDROMORPHONE HCL PF 1 MG/ML IJ SOLN
0.2500 mg | INTRAMUSCULAR | Status: DC | PRN
  Administered 2013-11-06 (×2): 0.25 mg via INTRAVENOUS

## 2013-11-06 MED ORDER — FUROSEMIDE 40 MG PO TABS
40.0000 mg | ORAL_TABLET | Freq: Every day | ORAL | Status: DC
  Administered 2013-11-06 – 2013-11-08 (×3): 40 mg via ORAL
  Filled 2013-11-06 (×3): qty 1

## 2013-11-06 MED ORDER — ATORVASTATIN CALCIUM 20 MG PO TABS
20.0000 mg | ORAL_TABLET | Freq: Every day | ORAL | Status: DC
  Administered 2013-11-06 – 2013-11-07 (×2): 20 mg via ORAL
  Filled 2013-11-06 (×3): qty 1

## 2013-11-06 MED ORDER — BUPIVACAINE LIPOSOME 1.3 % IJ SUSP
20.0000 mL | Freq: Once | INTRAMUSCULAR | Status: AC
  Administered 2013-11-06: 20 mL
  Filled 2013-11-06: qty 20

## 2013-11-06 MED ORDER — AMLODIPINE BESYLATE 10 MG PO TABS
10.0000 mg | ORAL_TABLET | Freq: Every evening | ORAL | Status: DC
  Administered 2013-11-06 – 2013-11-07 (×2): 10 mg via ORAL
  Filled 2013-11-06 (×3): qty 1

## 2013-11-06 MED ORDER — MEPERIDINE HCL 50 MG/ML IJ SOLN: 6.2500 mg | INTRAMUSCULAR | Status: DC | PRN

## 2013-11-06 MED ORDER — SODIUM CHLORIDE 0.9 % IV SOLN
INTRAVENOUS | Status: DC | PRN
  Administered 2013-11-06: 20 mL

## 2013-11-06 MED ORDER — STERILE WATER FOR IRRIGATION IR SOLN
Status: DC | PRN
  Administered 2013-11-06: 1500 mL

## 2013-11-06 MED ORDER — LOSARTAN POTASSIUM 50 MG PO TABS
50.0000 mg | ORAL_TABLET | Freq: Every evening | ORAL | Status: DC
  Administered 2013-11-06 – 2013-11-07 (×2): 50 mg via ORAL
  Filled 2013-11-06 (×3): qty 1

## 2013-11-06 MED ORDER — TAMSULOSIN HCL 0.4 MG PO CAPS: 0.4000 mg | ORAL_CAPSULE | Freq: Every day | ORAL | Status: DC

## 2013-11-06 MED ORDER — PROPOFOL 10 MG/ML IV BOLUS
INTRAVENOUS | Status: AC
  Filled 2013-11-06: qty 20

## 2013-11-06 MED ORDER — LACTATED RINGERS IV SOLN
INTRAVENOUS | Status: DC | PRN
  Administered 2013-11-06 (×2): via INTRAVENOUS

## 2013-11-06 MED ORDER — ONDANSETRON HCL 4 MG/2ML IJ SOLN
INTRAMUSCULAR | Status: AC
  Filled 2013-11-06: qty 2

## 2013-11-06 MED ORDER — ACETAMINOPHEN 500 MG PO TABS
1000.0000 mg | ORAL_TABLET | Freq: Three times a day (TID) | ORAL | Status: AC
  Administered 2013-11-06 – 2013-11-07 (×2): 1000 mg via ORAL
  Filled 2013-11-06 (×2): qty 2

## 2013-11-06 MED ORDER — ISOSORBIDE DINITRATE 20 MG PO TABS
20.0000 mg | ORAL_TABLET | Freq: Three times a day (TID) | ORAL | Status: DC
  Administered 2013-11-06 – 2013-11-08 (×6): 20 mg via ORAL
  Filled 2013-11-06 (×8): qty 1

## 2013-11-06 MED ORDER — TAMSULOSIN HCL 0.4 MG PO CAPS
0.4000 mg | ORAL_CAPSULE | Freq: Every day | ORAL | Status: DC
  Administered 2013-11-07 – 2013-11-08 (×2): 0.4 mg via ORAL
  Filled 2013-11-06 (×3): qty 1

## 2013-11-06 MED ORDER — ONDANSETRON HCL 4 MG/2ML IJ SOLN: 4.0000 mg | INTRAMUSCULAR | Status: DC | PRN

## 2013-11-06 MED ORDER — LIDOCAINE HCL (CARDIAC) 20 MG/ML IV SOLN
INTRAVENOUS | Status: AC
  Filled 2013-11-06: qty 5

## 2013-11-06 MED ORDER — PROPOFOL 10 MG/ML IV BOLUS
INTRAVENOUS | Status: DC | PRN
  Administered 2013-11-06: 150 mg via INTRAVENOUS
  Administered 2013-11-06: 50 mg via INTRAVENOUS

## 2013-11-06 MED ORDER — SODIUM CHLORIDE 0.9 % IJ SOLN
INTRAMUSCULAR | Status: AC
  Filled 2013-11-06: qty 20

## 2013-11-06 MED ORDER — GLYCOPYRROLATE 0.2 MG/ML IJ SOLN
INTRAMUSCULAR | Status: AC
  Filled 2013-11-06: qty 4

## 2013-11-06 MED ORDER — PROMETHAZINE HCL 25 MG/ML IJ SOLN: 6.2500 mg | INTRAMUSCULAR | Status: DC | PRN

## 2013-11-06 MED ORDER — HYDROMORPHONE HCL PF 1 MG/ML IJ SOLN: 0.5000 mg | INTRAMUSCULAR | Status: DC | PRN

## 2013-11-06 MED ORDER — OXYCODONE HCL 5 MG/5ML PO SOLN
5.0000 mg | Freq: Once | ORAL | Status: DC | PRN
  Filled 2013-11-06: qty 5

## 2013-11-06 MED ORDER — GLYCOPYRROLATE 0.2 MG/ML IJ SOLN
INTRAMUSCULAR | Status: DC | PRN
  Administered 2013-11-06: .8 mg via INTRAVENOUS

## 2013-11-06 MED ORDER — OXYCODONE HCL 5 MG PO TABS: 5.0000 mg | ORAL_TABLET | Freq: Once | ORAL | Status: DC | PRN

## 2013-11-06 MED ORDER — ROCURONIUM BROMIDE 100 MG/10ML IV SOLN
INTRAVENOUS | Status: DC | PRN
  Administered 2013-11-06: 10 mg via INTRAVENOUS
  Administered 2013-11-06: 20 mg via INTRAVENOUS
  Administered 2013-11-06: 10 mg via INTRAVENOUS
  Administered 2013-11-06: 50 mg via INTRAVENOUS

## 2013-11-06 MED ORDER — ROCURONIUM BROMIDE 100 MG/10ML IV SOLN
INTRAVENOUS | Status: AC
  Filled 2013-11-06: qty 1

## 2013-11-06 MED ORDER — LACTATED RINGERS IR SOLN
Status: DC | PRN
  Administered 2013-11-06: 1000 mL

## 2013-11-06 MED ORDER — FENTANYL CITRATE 0.05 MG/ML IJ SOLN
INTRAMUSCULAR | Status: DC | PRN
  Administered 2013-11-06 (×4): 50 ug via INTRAVENOUS
  Administered 2013-11-06: 100 ug via INTRAVENOUS
  Administered 2013-11-06 (×2): 50 ug via INTRAVENOUS

## 2013-11-06 MED ORDER — ONDANSETRON HCL 4 MG/2ML IJ SOLN
INTRAMUSCULAR | Status: DC | PRN
  Administered 2013-11-06: 4 mg via INTRAVENOUS

## 2013-11-06 MED ORDER — SERTRALINE HCL 100 MG PO TABS
100.0000 mg | ORAL_TABLET | Freq: Every evening | ORAL | Status: DC
  Administered 2013-11-06 – 2013-11-07 (×2): 100 mg via ORAL
  Filled 2013-11-06 (×3): qty 1

## 2013-11-06 MED ORDER — DOCUSATE SODIUM 100 MG PO CAPS
100.0000 mg | ORAL_CAPSULE | Freq: Two times a day (BID) | ORAL | Status: DC
  Administered 2013-11-06 – 2013-11-08 (×4): 100 mg via ORAL
  Filled 2013-11-06 (×5): qty 1

## 2013-11-06 MED ORDER — DEXTROSE 5 % IV SOLN
1.0000 g | Freq: Two times a day (BID) | INTRAVENOUS | Status: AC
  Administered 2013-11-06: 1 g via INTRAVENOUS
  Filled 2013-11-06: qty 10

## 2013-11-06 MED ORDER — LIDOCAINE HCL (CARDIAC) 20 MG/ML IV SOLN
INTRAVENOUS | Status: DC | PRN
  Administered 2013-11-06: 50 mg via INTRAVENOUS

## 2013-11-06 SURGICAL SUPPLY — 56 items
CHLORAPREP W/TINT 26ML (MISCELLANEOUS) ×3 IMPLANT
CLIP LIGATING HEM O LOK PURPLE (MISCELLANEOUS) IMPLANT
CLIP LIGATING HEMO LOK XL GOLD (MISCELLANEOUS) ×6 IMPLANT
CLIP LIGATING HEMO O LOK GREEN (MISCELLANEOUS) IMPLANT
CORDS BIPOLAR (ELECTRODE) ×3 IMPLANT
COVER SURGICAL LIGHT HANDLE (MISCELLANEOUS) IMPLANT
COVER TIP SHEARS 8 DVNC (MISCELLANEOUS) ×1 IMPLANT
COVER TIP SHEARS 8MM DA VINCI (MISCELLANEOUS) ×2
CUTTER ECHEON FLEX ENDO 45 340 (ENDOMECHANICALS) ×3 IMPLANT
DECANTER SPIKE VIAL GLASS SM (MISCELLANEOUS) IMPLANT
DERMABOND ADVANCED (GAUZE/BANDAGES/DRESSINGS) ×4
DERMABOND ADVANCED .7 DNX12 (GAUZE/BANDAGES/DRESSINGS) ×2 IMPLANT
DISSECTOR BLUNT TIP ENDO 5MM (MISCELLANEOUS) ×3 IMPLANT
DRAIN CHANNEL 15F RND FF 3/16 (WOUND CARE) IMPLANT
DRAPE ARM DVNC X/XI (DISPOSABLE) ×4 IMPLANT
DRAPE COLUMN DVNC XI (DISPOSABLE) ×1 IMPLANT
DRAPE DA VINCI XI ARM (DISPOSABLE) ×8
DRAPE DA VINCI XI COLUMN (DISPOSABLE) ×2
DRAPE INCISE IOBAN 66X45 STRL (DRAPES) ×3 IMPLANT
DRAPE LAPAROSCOPIC ABDOMINAL (DRAPES) ×3 IMPLANT
DRAPE LG THREE QUARTER DISP (DRAPES) ×6 IMPLANT
DRAPE TABLE BACK 44X90 PK DISP (DRAPES) ×3 IMPLANT
DRAPE WARM FLUID 44X44 (DRAPE) ×3 IMPLANT
ELECT REM PT RETURN 9FT ADLT (ELECTROSURGICAL) ×3
ELECTRODE REM PT RTRN 9FT ADLT (ELECTROSURGICAL) ×1 IMPLANT
EVACUATOR SILICONE 100CC (DRAIN) IMPLANT
GLOVE BIOGEL M STRL SZ7.5 (GLOVE) ×9 IMPLANT
GOWN STRL REUS W/TWL LRG LVL3 (GOWN DISPOSABLE) ×6 IMPLANT
KIT ACCESSORY DA VINCI DISP (KITS)
KIT ACCESSORY DVNC DISP (KITS) IMPLANT
KIT BASIN OR (CUSTOM PROCEDURE TRAY) ×3 IMPLANT
MANIFOLD NEPTUNE II (INSTRUMENTS) ×3 IMPLANT
PEN SKIN MARKING BROAD (MISCELLANEOUS) ×3 IMPLANT
PENCIL BUTTON HOLSTER BLD 10FT (ELECTRODE) ×3 IMPLANT
POSITIONER SURGICAL ARM (MISCELLANEOUS) ×3 IMPLANT
POUCH ENDO CATCH II 15MM (MISCELLANEOUS) ×3 IMPLANT
RELOAD WH ECHELON 45 (STAPLE) ×15 IMPLANT
SEAL CANN UNIV 5-8 DVNC XI (MISCELLANEOUS) ×4 IMPLANT
SEAL XI 5MM-8MM UNIVERSAL (MISCELLANEOUS) ×8
SET TUBE IRRIG SUCTION NO TIP (IRRIGATION / IRRIGATOR) ×3 IMPLANT
SOLUTION ANTI FOG 6CC (MISCELLANEOUS) ×3 IMPLANT
SOLUTION ELECTROLUBE (MISCELLANEOUS) ×3 IMPLANT
SPONGE LAP 18X18 X RAY DECT (DISPOSABLE) ×3 IMPLANT
SPONGE LAP 4X18 X RAY DECT (DISPOSABLE) ×3 IMPLANT
SUT ETHILON 3 0 PS 1 (SUTURE) IMPLANT
SUT MNCRL AB 4-0 PS2 18 (SUTURE) ×6 IMPLANT
SUT PDS AB 1 CT1 27 (SUTURE) ×12 IMPLANT
SUT VICRYL 0 UR6 27IN ABS (SUTURE) IMPLANT
SYR BULB IRRIGATION 50ML (SYRINGE) IMPLANT
TOWEL OR NON WOVEN STRL DISP B (DISPOSABLE) ×6 IMPLANT
TRAY FOLEY CATH 14FRSI W/METER (CATHETERS) ×3 IMPLANT
TRAY LAP CHOLE (CUSTOM PROCEDURE TRAY) ×3 IMPLANT
TROCAR 12M 150ML BLUNT (TROCAR) ×3 IMPLANT
TROCAR XCEL 12X100 BLDLESS (ENDOMECHANICALS) ×3 IMPLANT
TUBING INSUFFLATION 10FT LAP (TUBING) ×3 IMPLANT
WATER STERILE IRR 1500ML POUR (IV SOLUTION) IMPLANT

## 2013-11-06 NOTE — Op Note (Signed)
NAMEPRATT, BRESS NO.:  [id number]  MEDICAL RECORD NO.:  [id number]  LOCATION:  [facility]                         FACILITY:  [facility]  PHYSICIAN:  Alexis Frock, MD     DATE OF BIRTH:  09/26/1940  DATE OF PROCEDURE: 11/06/2013 DATE OF DISCHARGE:                              OPERATIVE REPORT   DIAGNOSES: 1. Large left renal mass. 2. History of left abdominal aortic aneurysm.  PROCEDURE:  Robotic assisted laparoscopic left radical nephrectomy.  ASSISTANT:  Rolan Bucco, M.D.  ESTIMATED BLOOD LOSS:  80 mL.  COMPLICATIONS:  None.  SPECIMEN:  Left radical nephrectomy.  FINDINGS: 1. Single artery, single vein, left renovascular anatomy. 2. 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  DRAINS:  Foley catheter straight drain.  INDICATION:  Mr. Mark [name] is a very pleasant 73 year old gentleman with multiple medical problems including moderate renal insufficiency, large abdominal aortic aneurysm, status post endovascular repair as well as an enhancing left renal mass.  He was initially on surveillance of his left mass; however, it became significantly larger and more complex, now 9 cm or so in total.  He underwent metastatic survey with chest x-ray and thermal imaging, which revealed no enlarged adenopathy or obvious distant disease.  Options were discussed including continued observation versus nephrectomy, and he wished to proceed with the latter.  Informed consent was obtained and placed in medical record.  Notably, he had his abdominal aortic aneurysm recently studied and has found that his endovascular repair was continuing to function properly.  Informed consent was obtained and placed in medical record.  PROCEDURE IN DETAIL:  The patient being Mark [name] verified, procedure being left robotic radical nephrectomy was confirmed.  Procedure was carried out.   Time-out was performed.  Intravenous antibiotics were administered.  General endotracheal anesthesia was introduced.  The patient was placed into a left side up full flank position applying 15 degrees stable flexion, superior arm elevator, axillary roll, sequential compression devices, 15 degrees stable flexion, and bean bag. Sterile field was created using chlorhexidine gluconate after taping his supraxiphoid chest and pelvis to the table.  Next, a high-flow low pressure pneumoperitoneum was obtained using Veress technique in position approximately 1 handbreadth superior medial to the anterior iliac spine well away from his area of known abdominal aortic aneurysm.  This passed the aspiration and drop test.  Next, the robotic camera port was placed in position approximately 1 handbreadth superolateral to the umbilicus.  Laparoscopic examination of the peritoneal cavity revealed no significant adhesions, no visceral injury. Additional ports were placed as follows.  Left subcostal 8-mm robotic port, left inferior paramedian 8-mm robotic port, approximately 1 handbreadth superior to the pubic ramus, and a left far lateral 8-mm robotic port, approximately 1 handbreadth superior to the anterior iliac spine.  Two system ports were placed in the midline, 12 mm in size, one superior and one inferior to the camera port.  Robot was docked and passed through electronic checks. Initial attention was directed at development of the retroperitoneum.  Incision was made lateral to the  descending colon from the area of the splenic flexure towards the area of the internal ring and it was carefully mobilized medially.  Lateral splenic attachments were taken down allowing this plain to rotate superomedially.  The lower pole of the kidney was identified, placed on gentle lateral traction.  Dissection proceeded medial to this.  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  This parasitic vessels were controlled using endovascular load stapler.  Ureter was controlled using Weck clips with cold ligation.  Dissection was then proceeded within this triangle superiorly towards the area of the renal hilum.  As we began close to the renal hilum, there were significant inflammatory response and small parasitic vessels as expected, and these were controlled using coagulation current.  The renal hilum was consisted of a single artery and a single early branching vein, left renovascular anatomy as suspected.  Circumferential mobilization was performed near the structures.  Artery was controlled using an extra large clip proximally followed by endovascular load stapler distally.  The vein was controlled using endovascular load stapler.  The adrenal was partially spared and separated from the upper pole of the kidney using endovascular load stapler.  Further medial and superior attachments were taken down using cautery scissors.  Lateral attachments were then taken down.  This completely freed up the left nephrectomy specimen, which was placed into an extra large EndoCatch bag for later retrieval.  Repeat examination of peritoneal cavity and inspection of the hilum revealed excellent hemostasis.  There was no obvious injury to the pancreas or spleen, so that peritoneal drain was not warranted.  As such, the robot was undocked.  The system was retrieved by connecting the previous two 12-mm assistant ports in the midline removing the specimen in its entirety setting aside for permanent pathology.  The fascia was closed using figure-of-eight PDS x7 followed by Scarpa's using running Vicryl.  All incisions were infiltrated with 40 mL of lyophilized Marcaine and closed level of skin using subcuticular Monocryl followed by Dermabond. Procedure was then terminated.  The patient tolerated the procedure well.  There were no  immediate periprocedural complications.  The patient was taken to the postanesthesia care in stable condition.          ______________________________ Alexis Frock, MD     TM/MEDQ  D:  11/06/2013  T:  11/06/2013  Job:  [id number]

## 2013-11-06 NOTE — Telephone Encounter (Signed)
Refill-tamsulosin  [employer]

## 2013-11-06 NOTE — Brief Op Note (Signed)
11/06/2013  10:29 AM  PATIENT:  Mark [name]  73 y.o. male  PRE-OPERATIVE DIAGNOSIS:  LARGE LEFT RENAL MASS NEAR ABDOMINAL ANEURYSM  POST-OPERATIVE DIAGNOSIS:  LARGE LEFT RENAL MASS NEAR ABDOMINAL ANEURYSM  PROCEDURE:  Procedure(s): ROBOTIC ASSISTED LAPAROSCOPIC RADICAL  NEPHRECTOMY (Left)  SURGEON:  Surgeon(s) and Role:    * Alexis Frock, MD - Primary    * Sharyn Creamer, MD - Assisting  PHYSICIAN ASSISTANT:   ASSISTANTS: Rolan Bucco MD   ANESTHESIA:   local and general  EBL:  Total I/O In: 1000 [I.V.:1000] Out: 350 [Urine:300; Blood:50]  BLOOD ADMINISTERED:none  DRAINS: foley    LOCAL MEDICATIONS USED:  MARCAINE     SPECIMEN:  Source of Specimen:  Left Radical Nephrectomy  DISPOSITION OF SPECIMEN:  PATHOLOGY  COUNTS:  YES  TOURNIQUET:  * No tourniquets in log *  DICTATION: .Other Dictation: Dictation Number [id number]  PLAN OF CARE: Admit to inpatient   PATIENT DISPOSITION:  PACU - hemodynamically stable.   Delay start of Pharmacological VTE agent (>24hrs) due to surgical blood loss or risk of bleeding: yes

## 2013-11-06 NOTE — Transfer of Care (Signed)
Immediate Anesthesia Transfer of Care Note  Patient: Mark [name]  Procedure(s) Performed: Procedure(s) (LRB): ROBOTIC ASSISTED LAPAROSCOPIC RADICAL  NEPHRECTOMY (Left)  Patient Location: PACU  Anesthesia Type: General  Level of Consciousness: sedated, patient cooperative and responds to stimulation  Airway & Oxygen Therapy: Patient Spontanous Breathing and Patient connected to face mask oxgen  Post-op Assessment: Report given to PACU RN and Post -op Vital signs reviewed and stable  Post vital signs: Reviewed and stable  Complications: No apparent anesthesia complications

## 2013-11-06 NOTE — H&P (Signed)
Mark [name] is an 73 y.o. male.    Chief Complaint: Pre-Op Left Robotic Radical Nephrectomy  HPI:   1 - Enlarging Left Renal Mass - 9cm left lower pole renal mass, predominantly cystic with some enhancing nodules, increasing since at least 2011. Recent CT 09/2013 without locally advanced or distant disesae. Appears 1 artery / 1 vein renovascular anatomy. Had LARGE AAA that begins below renal pedicle vasculature.  2 - Chronic Renal Insufficiency - Baseline Cr 1.4 range. No hydro / stones / renal vascular lesions by serial imaging.  PMH sig for CAD/Stents, AAA/EVAR (follows [facility]), TNA. His PCP is [name].   Today Jerid is seen to proceed with left robotic radical nephrectomy. He had repeat angiography recently which demonstrated NO evidence of endoleak form his EVAR.    Past Medical History  Diagnosis Date  . Hypertension   . Hyperlipidemia   . CAD (coronary artery disease)   . History of nephrolithiasis   . GERD (gastroesophageal reflux disease)   . Depression   . History of skin cancer   . History of pneumothorax     LONG TIME AGO  . Atrial fibrillation   . Dysrhythmia     AF  . Left renal mass   . Chronic kidney disease     CHRONIC RENAL INSUFFICIENCY  . Arthritis     LEFT HAND, RT KNEE  . Cancer     BASAL CELL CA OF NOSE  . Shortness of breath   . Myocardial infarction 2006  . Shingles outbreak     PT NOTICED SKIN RASH RT GROIN AND RIGHT TRUNK ON MONDAY 10/27/13 - NOT A LOT OF DISCOMFORT- NOT ON ANY ORAL MEDS TO TREAT.  Marland Kitchen AAA (abdominal aortic aneurysm)     9.1 cm repair 05/2009 ( ENDOVASCULAR REPAIR ).  MORE RECENT RE-EVALUATION OF ENLARGING AAA BY DR. Trula Slade AND BY DR. Kathlene Cote  MAY 2015 - PT STATES HE UNDERSTANDS THAT NOTHING IS LEAKING AT PRESENT TIME AND HE IS TO FOLLOW UP WITH HIS DOCTORS FOR FUTURE MONITORING I    Past Surgical History  Procedure Laterality Date  . Cardiac catheterization  06/2004  . Abdominal aortic aneurysm  repair  JAN 2011    ENDOVASCULAR - DR. BRABHAM  . Tonsillectomy    . Athroscopic knee surgery      RIGHT KNEE  . Lithotripsy      Family History  Problem Relation Age of Onset  . Coronary artery disease    . Hyperlipidemia    . Hypertension    . Pulmonary embolism Mother     died of PE  . Prostate cancer    . Anuerysm Father     history of popliteal  . Other Neg Hx     polycystic kidney disease, and colon cancer  . Colon cancer Neg Hx    Social History:  reports that he quit smoking about 33 years ago. He has never used smokeless tobacco. He reports that he drinks about 1.5 ounces of alcohol per week. He reports that he does not use illicit drugs.  Allergies:  Allergies  Allergen Reactions  . Ace Inhibitors     REACTION: Cough    Medications Prior to Admission  Medication Sig Dispense Refill  . amLODipine (NORVASC) 10 MG tablet Take 10 mg by mouth every evening.      Marland Kitchen apixaban (ELIQUIS) 5 MG TABS tablet Take 5 mg by mouth 2 (two) times daily.      Marland Kitchen  Coenzyme Q10 200 MG capsule Take 200 mg by mouth daily.      . furosemide (LASIX) 40 MG tablet Take 40 mg by mouth daily with lunch.      . isosorbide dinitrate (ISORDIL) 20 MG tablet Take 20 mg by mouth 3 (three) times daily.      Marland Kitchen losartan (COZAAR) 50 MG tablet Take 1 tablet (50 mg total) by mouth every evening.  90 tablet  1  . metoprolol (LOPRESSOR) 100 MG tablet Take 100 mg by mouth 2 (two) times daily.      . Multiple Vitamin (MULTIVITAMIN) tablet Take 1 tablet by mouth daily.        . Omega-3 Fatty Acids (FISH OIL) 1200 MG CAPS Take 1,200 mg by mouth 2 (two) times daily.       . pravastatin (PRAVACHOL) 80 MG tablet Take 1 tablet (80 mg total) by mouth every evening.  90 tablet  1  . ranitidine (ZANTAC) 150 MG tablet Take 150 mg by mouth 2 (two) times daily.      . sertraline (ZOLOFT) 100 MG tablet Take 100 mg by mouth every evening.       . tamsulosin (FLOMAX) 0.4 MG CAPS capsule Take 1 capsule (0.4 mg total) by mouth  daily after breakfast.  90 capsule  1  . Aspirin-Acetaminophen-Caffeine (GOODY HEADACHE PO) Take 1 packet by mouth every 8 (eight) hours as needed (headache).      . lidocaine (XYLOCAINE) 5 % ointment Apply 1 application topically 2 (two) times daily as needed.  50 g  11    No results found for this or any previous visit (from the past 48 hour(s)). No results found.  Review of Systems  Constitutional: Negative.  Negative for fever and chills.  HENT: Negative.   Eyes: Negative.   Respiratory: Negative.   Cardiovascular: Negative.   Gastrointestinal: Negative.   Genitourinary: Negative.   Musculoskeletal: Negative.   Skin: Negative.   Neurological: Negative.   Endo/Heme/Allergies: Negative.   Psychiatric/Behavioral: Negative.     Blood pressure 109/78, pulse 98, temperature 97.5 F (36.4 C), temperature source Oral, resp. rate 18, height 5\' 11"  (1.803 m), weight 104.327 kg (230 lb), SpO2 97.00%. Physical Exam  Constitutional: He is oriented to person, place, and time. He appears well-developed and well-nourished.  HENT:  Head: Normocephalic and atraumatic.  Eyes: EOM are normal. Pupils are equal, round, and reactive to light.  Neck: Normal range of motion. Neck supple.  Cardiovascular: Normal rate.   Respiratory: Effort normal.  GI: Soft. Bowel sounds are normal.  Some truncal obesity  Genitourinary: Penis normal.  No CVAT  Musculoskeletal: Normal range of motion.  Neurological: He is alert and oriented to person, place, and time.  Skin: Skin is warm and dry.  Psychiatric: He has a normal mood and affect. His behavior is normal. Judgment and thought content normal.     Assessment/Plan   1 - Enlarging Left Renal Mass - Worrisome, expanding mass likley c/w cyctic renal cell carcinoma, localized. Not amenable to partial nephrectomy. He has significnat comorbidity as well as risky anatomic features with large AAA that would make nephrectomy risky, but certainly plausible and  most definitive means of management.    We rediscussed the role of radical nephrectomy with the overall goal of complete surgical excision (negative margins) and better staging / diagnosis. We specifically rediscussed that with removal of the kidney there would be an overall renal function decline with attendant risks of renal failure and need for  dialysis in some cases, and need for kidney-friendly lifestyle post-op with excellent blood pressure and glycemic control. We then rediscussed surgical approaches including robotic and open techniques with robotic associated with a shorter convalescence. I showed the patient on their abdomen the approximately 4-6 incision (trocar) sites as well as presumed extraction sites with robotic approach as well as possible open incision sites. We specifically readdressed that there may be need to alter operative plans according to intraopertive findings including conversion to open procedure. We rediscussed specific peri-operative risks including bleeding, infection, deep vein thrombosis, pulmonary embolism, compartment syndrome, nuropathy / neuropraxia, heart attack, stroke, death, as well as long-term risks such as non-cure / need for additional therapy and need for imaging and lab based post-op surveillance protocols. We rediscussed typical hospital course of approximately 2 day hospitalization, need for peri-operative drains / catheters, and typical post-hospital course with return to most non-strenuous activities by 2 weeks and ability to return to most jobs and more strenuous activity such as exercise by 6 weeks.    After this lengthy and detail discussion, including answering all of the patient's questions to their satisfaction, they have chosen to proceed today as planned.   2 - Chronic Renal Insufficiency - Discussed again that post nephrectomy he will have much less reserve in this regard, but likely not require dialysis (though possible). This would also impact  his ability to have contrast vascular imaging in future as well.   Rickie Gutierres 11/06/2013, 6:12 AM

## 2013-11-06 NOTE — Progress Notes (Signed)
Patient arrived w Shingles right side, lower abd and right groin. Lesions scabed over. No drainage notes. Dr Tresa Moore aware.

## 2013-11-06 NOTE — Anesthesia Postprocedure Evaluation (Signed)
Anesthesia Post Note  Patient: Mark [name]  Procedure(s) Performed: Procedure(s) (LRB): ROBOTIC ASSISTED LAPAROSCOPIC RADICAL  NEPHRECTOMY (Left)  Anesthesia type: General  Patient location: PACU  Post pain: Pain level controlled  Post assessment: Post-op Vital signs reviewed  Last Vitals: BP 151/99  Pulse 85  Temp(Src) 36.4 C (Oral)  Resp 14  Ht 5\' 11"  (1.803 m)  Wt 230 lb (104.327 kg)  BMI 32.09 kg/m2  SpO2 94%  Post vital signs: Reviewed  Level of consciousness: sedated  Complications: No apparent anesthesia complications

## 2013-11-06 NOTE — Discharge Instructions (Signed)

## 2013-11-07 LAB — BASIC METABOLIC PANEL
ANION GAP: 11 (ref 5–15)
BUN: 20 mg/dL (ref 6–23)
CHLORIDE: 100 meq/L (ref 96–112)
CO2: 28 meq/L (ref 19–32)
Calcium: 9.3 mg/dL (ref 8.4–10.5)
Creatinine, Ser: 1.77 mg/dL — ABNORMAL HIGH (ref 0.50–1.35)
GFR calc Af Amer: 43 mL/min — ABNORMAL LOW (ref >90)
GFR calc non Af Amer: 37 mL/min — ABNORMAL LOW (ref >90)
GLUCOSE: 129 mg/dL — AB (ref 70–99)
Potassium: 3.6 mEq/L — ABNORMAL LOW (ref 3.7–5.3)
Sodium: 139 mEq/L (ref 137–147)

## 2013-11-07 LAB — HEMOGLOBIN AND HEMATOCRIT, BLOOD
HEMATOCRIT: 40.1 % (ref 39.0–52.0)
Hemoglobin: 13.4 g/dL (ref 13.0–17.0)

## 2013-11-07 NOTE — Clinical Documentation Improvement (Signed)
Presents with malignant neoplasm of kidney; radical robotic assisted nephrectomy performed. CKD is documented.   White male  GFR is 37 for this admission  Please clarify the stage of CKD the patient has to demonstrate an accurate Severity of Illness and Risk of Mortality score in the care you are giving this patient. Please document findings in next progress note and discharge summary.   _______CKD Stage I - GFR > OR = 90 _______CKD Stage II - GFR 60-80 _______CKD Stage III - GFR 30-59 _______CKD Stage IV - GFR 15-29 _______CKD Stage V - GFR < 15 _______ESRD (End Stage Renal Disease) _______Other condition_____________   Angela Adam ,RN Clinical Documentation Specialist:  [phone]  [facility] [employer] [facility]

## 2013-11-07 NOTE — Progress Notes (Signed)
Urology Inpatient Progress Report Status post left radical nephrectomy Intv/Subj: No acute events overnight. Patient is without complaint. Tolerated breakfast without issues. Denies nausea/vomiting, shortness of breath, or chest pain. Has had several elevated blood pressures, currently after receiving his scheduled blood pressure medication his blood pressure has trended back down to normal.  Past Medical History  Diagnosis Date  . Hypertension   . Hyperlipidemia   . CAD (coronary artery disease)   . History of nephrolithiasis   . GERD (gastroesophageal reflux disease)   . Depression   . History of skin cancer   . History of pneumothorax     LONG TIME AGO  . Atrial fibrillation   . Dysrhythmia     AF  . Left renal mass   . Chronic kidney disease     CHRONIC RENAL INSUFFICIENCY  . Arthritis     LEFT HAND, RT KNEE  . Cancer     BASAL CELL CA OF NOSE  . Shortness of breath   . Myocardial infarction 2006  . Shingles outbreak     PT NOTICED SKIN RASH RT GROIN AND RIGHT TRUNK ON MONDAY 10/27/13 - NOT A LOT OF DISCOMFORT- NOT ON ANY ORAL MEDS TO TREAT.  Marland Kitchen AAA (abdominal aortic aneurysm)     9.1 cm repair 05/2009 ( ENDOVASCULAR REPAIR ).  MORE RECENT RE-EVALUATION OF ENLARGING AAA BY DR. Trula Slade AND BY DR. Kathlene Cote  MAY 2015 - PT STATES HE UNDERSTANDS THAT NOTHING IS LEAKING AT PRESENT TIME AND HE IS TO FOLLOW UP WITH HIS DOCTORS FOR FUTURE MONITORING I   Current Facility-Administered Medications  Medication Dose Route Frequency Provider Last Rate Last Dose  . acetaminophen (TYLENOL) tablet 1,000 mg  1,000 mg Oral Q8H Alexis Frock, MD   1,000 mg at 11/07/13 0617  . amLODipine (NORVASC) tablet 10 mg  10 mg Oral QPM Alexis Frock, MD   10 mg at 11/06/13 1822  . atorvastatin (LIPITOR) tablet 20 mg  20 mg Oral q1800 Alexis Frock, MD   20 mg at 11/06/13 1822  . dextrose 5 %-0.45 % sodium chloride infusion   Intravenous Continuous Alexis Frock, MD 75 mL/hr at 11/06/13 2327    .  docusate sodium (COLACE) capsule 100 mg  100 mg Oral BID Alexis Frock, MD   100 mg at 11/07/13 1022  . furosemide (LASIX) tablet 40 mg  40 mg Oral Q lunch Alexis Frock, MD   40 mg at 11/06/13 1455  . HYDROmorphone (DILAUDID) injection 0.5-1 mg  0.5-1 mg Intravenous Q2H PRN Alexis Frock, MD      . isosorbide dinitrate (ISORDIL) tablet 20 mg  20 mg Oral TID Alexis Frock, MD   20 mg at 11/07/13 1022  . losartan (COZAAR) tablet 50 mg  50 mg Oral QPM Alexis Frock, MD   50 mg at 11/06/13 1308  . metoprolol (LOPRESSOR) tablet 100 mg  100 mg Oral BID Alexis Frock, MD   100 mg at 11/07/13 1022  . ondansetron (ZOFRAN) injection 4 mg  4 mg Intravenous Q4H PRN Alexis Frock, MD      . oxyCODONE (Oxy IR/ROXICODONE) immediate release tablet 5 mg  5 mg Oral Q4H PRN Alexis Frock, MD   5 mg at 11/07/13 0617  . senna (SENOKOT) tablet 8.6 mg  1 tablet Oral BID Alexis Frock, MD   8.6 mg at 11/07/13 1022  . sertraline (ZOLOFT) tablet 100 mg  100 mg Oral QPM Alexis Frock, MD   100 mg at 11/06/13 1823  . tamsulosin (  FLOMAX) capsule 0.4 mg  0.4 mg Oral QPC breakfast Alexis Frock, MD   0.4 mg at 11/07/13 0815     Objective: Vital: Filed Vitals:   11/07/13 0500 11/07/13 0700 11/07/13 0715 11/07/13 1021  BP: 162/101   118/82  Pulse:    80  Temp: 97.8 F (36.6 C)   97.9 F (36.6 C)  TempSrc: Oral   Oral  Resp: 18   18  Height:      Weight:      SpO2: 92% 95% 91% 94%   I/Os: I/O last 3 completed shifts: In: 3350 [I.V.:3350] Out: 3748 [Urine:4475; Blood:50]  Physical Exam:  General: Patient is in no apparent distress Lungs: Normal respiratory effort, chest expands symmetrically. Abdomen: Incisions are clean dry and intact. His abdomen is appropriately tender. Foley catheter is draining clear yellow urine. Ext: lower extremities symmetric  Lab Results:  Recent Labs  11/07/13 0454  HGB 13.4  HCT 40.1    Recent Labs  11/07/13 0454  NA 139  K 3.6*  CL 100  CO2 28  GLUCOSE  129*  BUN 20  CREATININE 1.77*  CALCIUM 9.3   No results found for this basename: LABPT, INR,  in the last 72 hours No results found for this basename: LABURIN,  in the last 72 hours Results for orders placed in visit on 02/02/11  URINE CULTURE     Status: None   Collection Time    02/02/11 10:31 AM      Result Value Ref Range Status   Colony Count NO GROWTH   Final   Organism ID, Bacteria NO GROWTH   Final    Studies/Results:   Assessment: 1 Day Post-Op status post left robotic-assisted laparoscopic radical nephrectomy, doing very well.  Plan: Transition to oral pain medicine DC Foley Hep-Lock IV fluids Encourage by mouth intake Encourage ambulation Hydralazine when necessary for elevated blood pressures Plan for discharge tomorrow.   Louis Meckel W 11/07/2013, 12:09 PM

## 2013-11-08 MED ORDER — APIXABAN 5 MG PO TABS: 5.0000 mg | ORAL_TABLET | Freq: Two times a day (BID) | ORAL | Status: DC

## 2013-11-08 NOTE — Progress Notes (Signed)
Pt leaving at this time with his "friend". Pt alert, oriented, and without c/o. Discharge instructions given/explained with pt verbalizing understanding. Followup appointment noted.  Pt leaving with glasses and cell phone.

## 2013-11-08 NOTE — Discharge Summary (Addendum)
Physician Discharge Summary  Patient ID: Mark [name] MRN: [id number] DOB/AGE: 1941/03/26 73 y.o.  Admit date: 11/06/2013 Discharge date: 11/08/2013  Admission Diagnoses: Renal mass, left Discharge Diagnoses:  Active Problems:   Renal mass, left   Discharged Condition: good  Hospital Course: Patient admitted following Robotic assisted laparoscopic left radical nephrectomy. Today, POD#2 pt is feeling well and hoping to go home. He is tolerating a regular diet, has adequate pain control and is ambulating without difficulty.    Consults: None  Significant Diagnostic Studies: none  Treatments: surgery: Robotic assisted laparoscopic left radical nephrectomy   Discharge Exam: Blood pressure 142/83, pulse 63, temperature 97.9 F (36.6 C), temperature source Oral, resp. rate 18, height 5\' 11"  (1.803 m), weight 104.327 kg (230 lb), SpO2 91.00%. NAD Watching TV CV - RRR Lungs - CTA B Abd - soft, NT, incisions c/d/i, good BS Ext - no calf pain or swelling.    Disposition: 01-Home or Self Care     Medication List         amLODipine 10 MG tablet  Commonly known as:  NORVASC  Take 10 mg by mouth every evening.     apixaban 5 MG Tabs tablet  Commonly known as:  ELIQUIS  Take 1 tablet (5 mg total) by mouth 2 (two) times daily.  Start taking on:  11/11/2013     Coenzyme Q10 200 MG capsule  Take 200 mg by mouth daily.     Fish Oil 1200 MG Caps  Take 1,200 mg by mouth 2 (two) times daily.     furosemide 40 MG tablet  Commonly known as:  LASIX  Take 40 mg by mouth daily with lunch.     GOODY HEADACHE PO  Take 1 packet by mouth every 8 (eight) hours as needed (headache).     isosorbide dinitrate 20 MG tablet  Commonly known as:  ISORDIL  Take 20 mg by mouth 3 (three) times daily.     lidocaine 5 % ointment  Commonly known as:  XYLOCAINE  Apply 1 application topically 2 (two) times daily as needed.     losartan 50 MG tablet  Commonly known as:  COZAAR  Take 1 tablet  (50 mg total) by mouth every evening.     metoprolol 100 MG tablet  Commonly known as:  LOPRESSOR  Take 100 mg by mouth 2 (two) times daily.     multivitamin tablet  Take 1 tablet by mouth daily.     pravastatin 80 MG tablet  Commonly known as:  PRAVACHOL  Take 1 tablet (80 mg total) by mouth every evening.     ranitidine 150 MG tablet  Commonly known as:  ZANTAC  Take 150 mg by mouth 2 (two) times daily.     sertraline 100 MG tablet  Commonly known as:  ZOLOFT  Take 100 mg by mouth every evening.     tamsulosin 0.4 MG Caps capsule  Commonly known as:  FLOMAX  Take 1 capsule (0.4 mg total) by mouth daily after breakfast.           Follow-up Information   Follow up with Alexis Frock, MD On 11/20/2013. (at 8:15 AM for MD visit)    Specialty:  Urology   Contact information:   [place] [address] [phone]       Signed: Festus Aloe 11/08/2013, 11:52 AM   Final pathology report confirms Stage 3 renal cell carcinoma. Patient's discharge labs confirm stage 3 chronic renal failure  with GFR 63mL calculatd by basic metabolc profile.

## 2013-11-10 ENCOUNTER — Encounter (HOSPITAL_COMMUNITY): Payer: Self-pay | Admitting: Urology

## 2013-11-18 ENCOUNTER — Encounter: Payer: Self-pay | Admitting: Family

## 2013-11-18 ENCOUNTER — Ambulatory Visit (INDEPENDENT_AMBULATORY_CARE_PROVIDER_SITE_OTHER): Payer: Medicare PPO | Admitting: Family

## 2013-11-18 VITALS — BP 146/90 | HR 84 | Temp 97.7°F | Resp 16 | Ht 71.5 in | Wt 221.0 lb

## 2013-11-18 DIAGNOSIS — E785 Hyperlipidemia, unspecified: Secondary | ICD-10-CM

## 2013-11-18 DIAGNOSIS — G4710 Hypersomnia, unspecified: Secondary | ICD-10-CM

## 2013-11-18 DIAGNOSIS — I10 Essential (primary) hypertension: Secondary | ICD-10-CM

## 2013-11-18 DIAGNOSIS — E876 Hypokalemia: Secondary | ICD-10-CM

## 2013-11-18 DIAGNOSIS — F3289 Other specified depressive episodes: Secondary | ICD-10-CM

## 2013-11-18 DIAGNOSIS — F329 Major depressive disorder, single episode, unspecified: Secondary | ICD-10-CM

## 2013-11-18 DIAGNOSIS — R400 Somnolence: Secondary | ICD-10-CM

## 2013-11-18 NOTE — Progress Notes (Signed)
Pre visit review using our clinic review tool, if applicable. No additional management support is needed unless otherwise documented below in the visit note. 

## 2013-11-18 NOTE — Progress Notes (Signed)
Subjective:    Patient ID: Mark [name], male    DOB: 1940-12-21, 73 y.o.   MRN: [id number]  HPI  Mark [name] is a 73 yr old male who presents today for hospital follow up. He was admitted on 7/2-7/4 for robitic assisted laparoscopic left radical nephrectomy. Path revealed:  CLEAR CELL RENAL CELL CARCINOMA, FUHRMAN NUCLEAR GRADE III, 8.2 CM, INVADING INTO THE PERIRENAL AND RENAL SINUS FATTY TISSUE.  He is scheduled for follow up with Dr. Alexis Frock on 7/16.  Note to pt from Dr. Tresa Moore indicates that biopsy showed negative margins and that they will perform periodic "surveillance" with x-rays and blood work to verify no recurrence.  HTN-  BP Readings from Last 3 Encounters:  11/18/13 146/90  11/08/13 142/83  11/08/13 142/83   Depression- reports that he has handled his diagnosis well.  Mood has been stable.   Shingles- saw Cody on 10/29/13.  Reports that the area on his right hip is sensitive.  No real neuropathic pain.    I did receive a note from the RN who performed pre-op eval that pt scored high on the OSA screening tool. He does not some daytime somnolence and is aware that he snores.  Review of Systems See HPI  Past Medical History  Diagnosis Date  . Hypertension   . Hyperlipidemia   . CAD (coronary artery disease)   . History of nephrolithiasis   . GERD (gastroesophageal reflux disease)   . Depression   . History of skin cancer   . History of pneumothorax     LONG TIME AGO  . Atrial fibrillation   . Dysrhythmia     AF  . Left renal mass   . Chronic kidney disease     CHRONIC RENAL INSUFFICIENCY  . Arthritis     LEFT HAND, RT KNEE  . Cancer     BASAL CELL CA OF NOSE  . Shortness of breath   . Myocardial infarction 2006  . Shingles outbreak     PT NOTICED SKIN RASH RT GROIN AND RIGHT TRUNK ON MONDAY 10/27/13 - NOT A LOT OF DISCOMFORT- NOT ON ANY ORAL MEDS TO TREAT.  Marland Kitchen AAA (abdominal aortic aneurysm)     9.1 cm repair 05/2009 ( ENDOVASCULAR REPAIR ).  MORE  RECENT RE-EVALUATION OF ENLARGING AAA BY DR. Trula Slade AND BY DR. Kathlene Cote  MAY 2015 - PT STATES HE UNDERSTANDS THAT NOTHING IS LEAKING AT PRESENT TIME AND HE IS TO FOLLOW UP WITH HIS DOCTORS FOR FUTURE MONITORING I    History   Social History  . Marital Status: Divorced    Spouse Name: N/A    Number of Children: 2  . Years of Education: N/A   Occupational History  .     Social History Main Topics  . Smoking status: Former Smoker    Quit date: 08/24/1980  . Smokeless tobacco: Never Used     Comment: quit   . Alcohol Use: 1.5 oz/week    3 drink(s) per week     Comment: Occasional  . Drug Use: No  . Sexual Activity: Not on file   Other Topics Concern  . Not on file   Social History Narrative   Retired   Divorced   2 sons 55 &30     Occupation:  Part time sports broadcaster    Smoking Status:  quit (08/24/1980)   Packs/Day:  1.0    Caffeine use/day:  3 beverages daily    Does Patient Exercise:  no   Alcohol Use - yes    Past Surgical History  Procedure Laterality Date  . Cardiac catheterization  06/2004  . Abdominal aortic aneurysm repair  JAN 2011    ENDOVASCULAR - DR. BRABHAM  . Tonsillectomy    . Athroscopic knee surgery      RIGHT KNEE  . Lithotripsy    . Robot assisted laparoscopic nephrectomy Left 11/06/2013    Procedure: ROBOTIC ASSISTED LAPAROSCOPIC RADICAL  NEPHRECTOMY;  Surgeon: Alexis Frock, MD;  Location: [facility];  Service: Urology;  Laterality: Left;    Family History  Problem Relation Age of Onset  . Coronary artery disease    . Hyperlipidemia    . Hypertension    . Pulmonary embolism Mother     died of PE  . Prostate cancer    . Anuerysm Father     history of popliteal  . Other Neg Hx     polycystic kidney disease, and colon cancer  . Colon cancer Neg Hx     Allergies  Allergen Reactions  . Ace Inhibitors     REACTION: Cough    Current Outpatient Prescriptions on File Prior to Visit  Medication Sig Dispense Refill  . amLODipine  (NORVASC) 10 MG tablet Take 10 mg by mouth every evening.      Marland Kitchen apixaban (ELIQUIS) 5 MG TABS tablet Take 1 tablet (5 mg total) by mouth 2 (two) times daily.  60 tablet    . Coenzyme Q10 200 MG capsule Take 200 mg by mouth daily.      . furosemide (LASIX) 40 MG tablet Take 40 mg by mouth daily with lunch.      . isosorbide dinitrate (ISORDIL) 20 MG tablet Take 20 mg by mouth 3 (three) times daily.      Marland Kitchen lidocaine (XYLOCAINE) 5 % ointment Apply 1 application topically 2 (two) times daily as needed.  50 g  11  . losartan (COZAAR) 50 MG tablet Take 1 tablet (50 mg total) by mouth every evening.  90 tablet  1  . metoprolol (LOPRESSOR) 100 MG tablet Take 100 mg by mouth 2 (two) times daily.      . Multiple Vitamin (MULTIVITAMIN) tablet Take 1 tablet by mouth daily.        . Omega-3 Fatty Acids (FISH OIL) 1200 MG CAPS Take 1,200 mg by mouth 2 (two) times daily.       . pravastatin (PRAVACHOL) 80 MG tablet Take 1 tablet (80 mg total) by mouth every evening.  90 tablet  1  . ranitidine (ZANTAC) 150 MG tablet Take 150 mg by mouth 2 (two) times daily.      . sertraline (ZOLOFT) 100 MG tablet Take 100 mg by mouth every evening.       . tamsulosin (FLOMAX) 0.4 MG CAPS capsule Take 1 capsule (0.4 mg total) by mouth daily after breakfast.  90 capsule  1   No current facility-administered medications on file prior to visit.    BP 146/90  Pulse 84  Temp(Src) 97.7 F (36.5 C) (Axillary)  Resp 16  Ht 5' 11.5" (1.816 m)  Wt 221 lb 0.6 oz (100.263 kg)  BMI 30.40 kg/m2  SpO2 97%       Objective:   Physical Exam  Constitutional: He is oriented to person, place, and time. He appears well-developed and well-nourished. No distress.  Cardiovascular: Normal rate and regular rhythm.   No murmur heard. Pulmonary/Chest: Effort normal and breath sounds normal. No respiratory distress. He has  no wheezes. He has no rales. He exhibits no tenderness.  Neurological: He is alert and oriented to person, place, and  time.  Skin:  Scabbed zoster lesions noted right thigh/groin  Psychiatric: He has a normal mood and affect. His behavior is normal. Judgment and thought content normal.          Assessment & Plan:

## 2013-11-18 NOTE — Patient Instructions (Signed)
Please check your blood pressure once daily for 1 week and send me your readings through Mychart. Please complete your lab work prior to leaving. Follow up in 3-4 months, sooner if problems/concerns.

## 2013-11-19 LAB — BASIC METABOLIC PANEL
BUN: 27 mg/dL — AB (ref 6–23)
CALCIUM: 8.9 mg/dL (ref 8.4–10.5)
CHLORIDE: 104 meq/L (ref 96–112)
CO2: 26 mEq/L (ref 19–32)
Creat: 2.08 mg/dL — ABNORMAL HIGH (ref 0.50–1.35)
GLUCOSE: 93 mg/dL (ref 70–99)
Potassium: 4 mEq/L (ref 3.5–5.3)
Sodium: 140 mEq/L (ref 135–145)

## 2013-11-19 LAB — LIPID PANEL
Cholesterol: 165 mg/dL (ref 0–200)
HDL: 37 mg/dL — AB (ref >39)
LDL CALC: 107 mg/dL — AB (ref 0–99)
Total CHOL/HDL Ratio: 4.5 Ratio
Triglycerides: 103 mg/dL (ref <150)
VLDL: 21 mg/dL (ref 0–40)

## 2013-11-20 ENCOUNTER — Telehealth: Payer: Self-pay | Admitting: Family

## 2013-11-20 DIAGNOSIS — E7889 Other lipoprotein metabolism disorders: Secondary | ICD-10-CM

## 2013-11-20 DIAGNOSIS — R400 Somnolence: Secondary | ICD-10-CM | POA: Insufficient documentation

## 2013-11-20 DIAGNOSIS — Z79899 Other long term (current) drug therapy: Secondary | ICD-10-CM

## 2013-11-20 MED ORDER — ATORVASTATIN CALCIUM 40 MG PO TABS: 40.0000 mg | ORAL_TABLET | Freq: Every day | ORAL | Status: DC

## 2013-11-20 NOTE — Assessment & Plan Note (Signed)
We discussed possibility of OSA. He would like to proceed with home sleep study.

## 2013-11-20 NOTE — Assessment & Plan Note (Signed)
S/p nephrectomy.  Management per Urology- Dr. Tresa Moore. Creatinine has risen slightly from his preoperative reading.   Lab Results  Component Value Date   CREATININE 2.08* 11/18/2013   CREATININE 1.77* 11/07/2013   CREATININE 1.70* 10/30/2013

## 2013-11-20 NOTE — Telephone Encounter (Signed)
(  kidney function) has deteriorated slightly since your surgery.  This is not surprising since he is down 1 kidney.  Cholesterol is just slightly above goal.  I would like to switch him from pravastatin to atorvastatin (stronger med).  Repeat flp/lft in 6 weeks.

## 2013-11-20 NOTE — Assessment & Plan Note (Signed)
Stable on zoloft, continue same.  

## 2013-11-20 NOTE — Assessment & Plan Note (Signed)
BP borderline elevated. Advised pt to check BP once daily x 1 week and contact us with these readings.

## 2013-11-20 NOTE — Telephone Encounter (Signed)
Patient informed, understood & agreed, new lab orders placed; Rx resent for 90-day to mail order & #30 for short term supply to local pharmacy/[facility]

## 2013-11-24 ENCOUNTER — Telehealth: Payer: Self-pay

## 2013-11-24 NOTE — Telephone Encounter (Signed)
Patient called for samples of eliquis placed samples up front 

## 2013-12-18 ENCOUNTER — Telehealth: Payer: Self-pay | Admitting: Family

## 2013-12-18 MED ORDER — SERTRALINE HCL 100 MG PO TABS: 100.0000 mg | ORAL_TABLET | Freq: Every evening | ORAL | Status: DC

## 2013-12-18 MED ORDER — RANITIDINE HCL 150 MG PO TABS: 150.0000 mg | ORAL_TABLET | Freq: Two times a day (BID) | ORAL | Status: DC

## 2013-12-18 MED ORDER — METOPROLOL TARTRATE 100 MG PO TABS: 100.0000 mg | ORAL_TABLET | Freq: Two times a day (BID) | ORAL | Status: DC

## 2013-12-18 NOTE — Telephone Encounter (Signed)
Refills sent

## 2013-12-18 NOTE — Telephone Encounter (Signed)
Refill- ranitidine  Refill- sertraline  Refill- metoprolol  [facility]

## 2014-01-02 DIAGNOSIS — G4710 Hypersomnia, unspecified: Secondary | ICD-10-CM

## 2014-01-02 DIAGNOSIS — G4730 Sleep apnea, unspecified: Secondary | ICD-10-CM

## 2014-01-07 DIAGNOSIS — G4710 Hypersomnia, unspecified: Secondary | ICD-10-CM

## 2014-01-07 DIAGNOSIS — G4730 Sleep apnea, unspecified: Secondary | ICD-10-CM

## 2014-01-08 ENCOUNTER — Encounter: Payer: Self-pay | Admitting: Family

## 2014-01-08 DIAGNOSIS — G4733 Obstructive sleep apnea (adult) (pediatric): Secondary | ICD-10-CM | POA: Insufficient documentation

## 2014-01-19 ENCOUNTER — Telehealth: Payer: Self-pay

## 2014-01-19 NOTE — Telephone Encounter (Signed)
patient came to office to get samples of eliquis gave to him at the front desk

## 2014-01-20 ENCOUNTER — Telehealth: Payer: Self-pay | Admitting: Family

## 2014-01-20 NOTE — Telephone Encounter (Signed)
Caller name:Stroble, Fritz Pickerel Relation to DT:HYHO Call back number:[phone] Pharmacy:  Reason for call: pt states he had his sleep test done last week and he received a call informing him that he needed to do further testing, however pt does not know how to get back in touch with the person that called him to set up the labs for further testing.

## 2014-01-20 NOTE — Telephone Encounter (Signed)
Spoke with Waunita Schooner at [facility] and he states he will call pt now to arrange appt.

## 2014-02-16 ENCOUNTER — Telehealth: Payer: Self-pay

## 2014-02-16 NOTE — Telephone Encounter (Signed)
PATIENT CALLED TO GET SAMPLES OF ELIQUIS , I PLACED A MONTH OF SAMPLES UP FRONT

## 2014-02-28 ENCOUNTER — Other Ambulatory Visit: Payer: Self-pay | Admitting: Family

## 2014-03-12 ENCOUNTER — Ambulatory Visit (HOSPITAL_BASED_OUTPATIENT_CLINIC_OR_DEPARTMENT_OTHER): Payer: Medicare PPO | Attending: Family | Admitting: Radiology

## 2014-03-12 VITALS — Ht 71.5 in | Wt 220.0 lb

## 2014-03-12 DIAGNOSIS — N189 Chronic kidney disease, unspecified: Secondary | ICD-10-CM | POA: Insufficient documentation

## 2014-03-12 DIAGNOSIS — I10 Essential (primary) hypertension: Secondary | ICD-10-CM | POA: Diagnosis not present

## 2014-03-12 DIAGNOSIS — I4891 Unspecified atrial fibrillation: Secondary | ICD-10-CM | POA: Diagnosis not present

## 2014-03-12 DIAGNOSIS — G4733 Obstructive sleep apnea (adult) (pediatric): Secondary | ICD-10-CM | POA: Diagnosis present

## 2014-03-17 NOTE — Progress Notes (Signed)
HPI: FU atrial fibrillation, coronary disease, hypertension and hyperlipidemia. The patient's cardiac history dates back to 2006 when he had PCI of his LAD and first diagonal. Note his LV function was normal. He also had a subtotal of his PDA. Patient had repair of AAA in Jan 2011. Myovue in July of 2014 showed ejection fraction 69% and normal perfusion. Echocardiogram in June of 2014 showed normal LV function, mild left ventricular hypertrophy, mild aortic and mitral regurgitation and biatrial enlargement. Patient also with h/o atrial fibrillation treated with rate control and anticoagulation. Holter monitor in April of 2015 showed atrial fibrillation with PVCs or aberrantly conducted beats; rate controlled. Abd CTA 5/15 showed enargement of aneurysm sac and endoleak could not be excluded. Also with left kidney mass. Angiogram ultimately showed no leak. Pt subsequently had left nephrectomy. Since last seen, He has mild dyspnea on exertion but no orthopnea or PND. Minimal pedal edema. No chest pain.  Current Outpatient Prescriptions  Medication Sig Dispense Refill  . amLODipine (NORVASC) 10 MG tablet Take 10 mg by mouth every evening.    Marland Kitchen apixaban (ELIQUIS) 5 MG TABS tablet Take 1 tablet (5 mg total) by mouth 2 (two) times daily. 60 tablet   . atorvastatin (LIPITOR) 40 MG tablet TAKE 1 TABLET ONE TIME DAILY 90 tablet 0  . Coenzyme Q10 200 MG capsule Take 200 mg by mouth daily.    . furosemide (LASIX) 40 MG tablet Take 40 mg by mouth daily with lunch.    . isosorbide dinitrate (ISORDIL) 20 MG tablet Take 20 mg by mouth 3 (three) times daily.    Marland Kitchen lidocaine (XYLOCAINE) 5 % ointment Apply 1 application topically 2 (two) times daily as needed. 50 g 11  . losartan (COZAAR) 50 MG tablet Take 1 tablet (50 mg total) by mouth every evening. 90 tablet 1  . metoprolol (LOPRESSOR) 100 MG tablet Take 1 tablet (100 mg total) by mouth 2 (two) times daily. 180 tablet 0  . Multiple Vitamin (MULTIVITAMIN)  tablet Take 1 tablet by mouth daily.      . Omega-3 Fatty Acids (FISH OIL) 1200 MG CAPS Take 1,200 mg by mouth 2 (two) times daily.     . ranitidine (ZANTAC) 150 MG tablet Take 1 tablet (150 mg total) by mouth 2 (two) times daily. 180 tablet 0  . sertraline (ZOLOFT) 100 MG tablet Take 1 tablet (100 mg total) by mouth every evening. 90 tablet 0  . tamsulosin (FLOMAX) 0.4 MG CAPS capsule Take 1 capsule (0.4 mg total) by mouth daily after breakfast. 90 capsule 1   No current facility-administered medications for this visit.     Past Medical History  Diagnosis Date  . Hypertension   . Hyperlipidemia   . CAD (coronary artery disease)   . History of nephrolithiasis   . GERD (gastroesophageal reflux disease)   . Depression   . History of skin cancer   . History of pneumothorax     LONG TIME AGO  . Atrial fibrillation   . Dysrhythmia     AF  . Left renal mass   . Chronic kidney disease     CHRONIC RENAL INSUFFICIENCY  . Arthritis     LEFT HAND, RT KNEE  . Cancer     BASAL CELL CA OF NOSE  . Shortness of breath   . Myocardial infarction 2006  . Shingles outbreak     PT NOTICED SKIN RASH RT GROIN AND RIGHT TRUNK ON MONDAY 10/27/13 -  NOT A LOT OF DISCOMFORT- NOT ON ANY ORAL MEDS TO TREAT.  Marland Kitchen AAA (abdominal aortic aneurysm)     9.1 cm repair 05/2009 ( ENDOVASCULAR REPAIR ).  MORE RECENT RE-EVALUATION OF ENLARGING AAA BY DR. Trula Slade AND BY DR. Kathlene Cote  MAY 2015 - PT STATES HE UNDERSTANDS THAT NOTHING IS LEAKING AT PRESENT TIME AND HE IS TO FOLLOW UP WITH HIS DOCTORS FOR FUTURE MONITORING I    Past Surgical History  Procedure Laterality Date  . Cardiac catheterization  06/2004  . Abdominal aortic aneurysm repair  JAN 2011    ENDOVASCULAR - DR. BRABHAM  . Tonsillectomy    . Athroscopic knee surgery      RIGHT KNEE  . Lithotripsy    . Robot assisted laparoscopic nephrectomy Left 11/06/2013    Procedure: ROBOTIC ASSISTED LAPAROSCOPIC RADICAL  NEPHRECTOMY;  Surgeon: Alexis Frock, MD;   Location: [facility];  Service: Urology;  Laterality: Left;    History   Social History  . Marital Status: Divorced    Spouse Name: N/A    Number of Children: 2  . Years of Education: N/A   Occupational History  .     Social History Main Topics  . Smoking status: Former Smoker    Quit date: 08/24/1980  . Smokeless tobacco: Never Used     Comment: quit   . Alcohol Use: 1.5 oz/week    3 drink(s) per week     Comment: Occasional  . Drug Use: No  . Sexual Activity: Not on file   Other Topics Concern  . Not on file   Social History Narrative   Retired   Divorced   2 sons 78 &30     Occupation:  Part time sports Tax adviser    Smoking Status:  quit (08/24/1980)   Packs/Day:  1.0    Caffeine use/day:  3 beverages daily    Does Patient Exercise:  no   Alcohol Use - yes    ROS: Some fatigue but no fevers or chills, productive cough, hemoptysis, dysphasia, odynophagia, melena, hematochezia, dysuria, hematuria, rash, seizure activity, orthopnea, PND, claudication. Remaining systems are negative.  Physical Exam: Well-developed well-nourished in no acute distress.  Skin is warm and dry.  HEENT is normal.  Neck is supple.  Chest is clear to auscultation with normal expansion.  Cardiovascular exam is irregular Abdominal exam nontender or distended. No masses palpated. Extremities show trace edema. neuro grossly intact  ECG Atrial fibrillation at a rate of 65. No significant ST changes.

## 2014-03-18 ENCOUNTER — Telehealth: Payer: Self-pay

## 2014-03-18 ENCOUNTER — Encounter: Payer: Self-pay | Admitting: Cardiology

## 2014-03-18 ENCOUNTER — Ambulatory Visit (INDEPENDENT_AMBULATORY_CARE_PROVIDER_SITE_OTHER): Payer: Medicare PPO | Admitting: Cardiology

## 2014-03-18 VITALS — BP 126/88 | HR 65 | Ht 71.0 in | Wt 231.0 lb

## 2014-03-18 DIAGNOSIS — I2510 Atherosclerotic heart disease of native coronary artery without angina pectoris: Secondary | ICD-10-CM

## 2014-03-18 DIAGNOSIS — N289 Disorder of kidney and ureter, unspecified: Secondary | ICD-10-CM

## 2014-03-18 DIAGNOSIS — I480 Paroxysmal atrial fibrillation: Secondary | ICD-10-CM

## 2014-03-18 DIAGNOSIS — I10 Essential (primary) hypertension: Secondary | ICD-10-CM

## 2014-03-18 DIAGNOSIS — I714 Abdominal aortic aneurysm, without rupture, unspecified: Secondary | ICD-10-CM

## 2014-03-18 LAB — BASIC METABOLIC PANEL WITH GFR
BUN: 31 mg/dL — ABNORMAL HIGH (ref 6–23)
CO2: 25 mEq/L (ref 19–32)
Calcium: 9.1 mg/dL (ref 8.4–10.5)
Chloride: 105 mEq/L (ref 96–112)
Creat: 1.97 mg/dL — ABNORMAL HIGH (ref 0.50–1.35)
GFR, EST AFRICAN AMERICAN: 39 mL/min — AB
GFR, Est Non African American: 33 mL/min — ABNORMAL LOW
Glucose, Bld: 93 mg/dL (ref 70–99)
POTASSIUM: 3.9 meq/L (ref 3.5–5.3)
SODIUM: 139 meq/L (ref 135–145)

## 2014-03-18 LAB — CBC
HCT: 37.6 % — ABNORMAL LOW (ref 39.0–52.0)
Hemoglobin: 12.8 g/dL — ABNORMAL LOW (ref 13.0–17.0)
MCH: 30.5 pg (ref 26.0–34.0)
MCHC: 34 g/dL (ref 30.0–36.0)
MCV: 89.7 fL (ref 78.0–100.0)
PLATELETS: 148 10*3/uL — AB (ref 150–400)
RBC: 4.19 MIL/uL — ABNORMAL LOW (ref 4.22–5.81)
RDW: 14.9 % (ref 11.5–15.5)
WBC: 6 10*3/uL (ref 4.0–10.5)

## 2014-03-18 NOTE — Assessment & Plan Note (Signed)
Recheck BUN and creatinine. Status post recent nephrectomy. May need to be followed by nephrology.

## 2014-03-18 NOTE — Telephone Encounter (Signed)
Patient came to the office to pick up samples of eliquis

## 2014-03-18 NOTE — Assessment & Plan Note (Signed)
Blood pressure controlled. Continue present medications. 

## 2014-03-18 NOTE — Patient Instructions (Signed)
Your physician wants you to follow-up in: 6 MONTHS WITH DR CRENSHAW You will receive a reminder letter in the mail two months in advance. If you don't receive a letter, please call our office to schedule the follow-up appointment.   Your physician recommends that you HAVE LAB WORK TODAY 

## 2014-03-18 NOTE — Assessment & Plan Note (Signed)
Patient remains in permanent atrial fibrillation. Continue beta blocker for rate control. Continue apixaban; check Hgb and renal function.

## 2014-03-18 NOTE — Assessment & Plan Note (Signed)
Continue statin. 

## 2014-03-18 NOTE — Assessment & Plan Note (Signed)
Status post repair with recent arteriogram as outlined above. Follow-up vascular surgery.

## 2014-03-18 NOTE — Assessment & Plan Note (Signed)
Continue statin. Not on aspirin given the anticoagulation.

## 2014-03-26 ENCOUNTER — Telehealth: Payer: Self-pay | Admitting: Family

## 2014-03-26 DIAGNOSIS — G4710 Hypersomnia, unspecified: Secondary | ICD-10-CM

## 2014-03-26 DIAGNOSIS — G4733 Obstructive sleep apnea (adult) (pediatric): Secondary | ICD-10-CM

## 2014-03-26 NOTE — Sleep Study (Signed)
[place]  NAME: Mark [name]  DATE OF BIRTH: [birth year]  MEDICAL RECORD NUMBER [id number]  LOCATION: [facility]  PHYSICIAN: Voula Waln V.  DATE OF STUDY: 03/12/14   SLEEP STUDY TYPE: CPAP titration study               REFERRING PHYSICIAN: Rigoberto Noel, MD  INDICATION FOR STUDY: 73 year old with hypertension, atrial fibrillation and chronic kidney disease. Home sleep study showed severe OSA with AHI of 49 events per hour. A few central apneas were also noted. An C-peptide titration study was scheduled At the time of this study ,they weighed 220 pounds with a height of  5 ft 11 inches and the BMI of 30, neck size of 17.5 inches. Epworth sleepiness score was 5   This CPAP titration polysomnogram was performed with a sleep technologist in attendance. EEG, EOG,EMG and respiratory parameters recorded. Sleep stages, arousals, limb movements and respiratory data was scored according to criteria laid out by the American Academy of sleep medicine.  SLEEP ARCHITECTURE: Lights out was at 2311 PM and lights on was at 510 AM. Total sleep time was 326 minutes with sleep period time of 359 minutes and sleep efficiency of 91% .Sleep latency was 10 minutes with latency to REM sleep of 106 minutes and wake after sleep onset of 24 minutes.  Sleep stages as a percentage of total sleep time was N1 6 N2- 60 % and REM sleep 34 % ( 112 minutes) . The longest period of REM sleep was around 2 AM.   AROUSAL DATA : There were 74 arousals with an arousal index of 13 events per hour. Of these 37 were spontaneous, and 32  were associated with respiratory events and 5 were associated periodic limb movements  RESPIRATORY DATA: CPAP was initiated at 5 centimeters and titrated to a final level of 19 centimeters due to respiratory events and snoring. Due to persistent snoring even at this level of sleep apnea, and high pressure requirement, BiPAP was initiated. At the final  level of 22/17 centimeters for 21 minutes including 7 minutes of REM sleep, there were 0 obstructive apneas, 0 central apneas, 0 mixed apneas and 0 hypopneas with apnea -hypopnea index of 0 events per hour.  There was no relation to sleep stage or body position. Titration was optimal.  MOVEMENT/PARASOMNIA: There were 96 PLMS with a PLM index of 18 events per hour. The PLM arousal index was 0.9 events per hour.  OXYGEN DATA: The lowest desaturation was 84 % during non-REM sleep and the desaturation index was 13 per hour. The saturations stayed below 88% for 5 minutes.  CARDIAC DATA: The low heart rate was 30 beats per minute. The high heart rate recorded was an artifact. No arrhythmias were noted   IMPRESSION :  1. Severe obstructive sleep apnea with hypopneas causing sleep fragmentation and mild oxygen desaturation. 2. C Pap could not correct this degree of sleep-disordered breathing .This was corrected by BiPAP of 21/17 centimeters with a medium fullface mask. Titration was optimal. 3. No evidence of cardiac arrhythmias or behavioral disturbance during sleep. 4. Periodic limb movements were not significant at higher pressures.  RECOMMENDATION:    1. The treatment options for this degree of sleep disordered breathing includes weight loss and/or BiPAP therapy. BiPAP can be initiated at 21/17 centimeters with a medium fullface mask and compliance monitored at this level. If this high level of pressure is not tolerated, auto BiPAP settings can also  be used with IPAP settings ranging from 10-20 cm, EPAP settings ranging from 8-17 cm 2. Patient should be cautioned against driving when sleepy 3. They should be asked to avoid medications with sedative side effects  Rigoberto Noel  MD Diplomate, American Board of Sleep Medicine  ELECTRONICALLY SIGNED ON:  03/26/2014  [facility] PH: [phone]   FX: ([phone]) [place]

## 2014-03-26 NOTE — Telephone Encounter (Signed)
Please contact pt and let him know that his sleep study showed severe sleep apnea.  I recommend arranging home cpap to help correct this. He should follow up with me 6 weeks after starting.

## 2014-03-27 NOTE — Telephone Encounter (Signed)
Left message for pt to return my call.

## 2014-03-30 MED ORDER — METOPROLOL TARTRATE 100 MG PO TABS: 100.0000 mg | ORAL_TABLET | Freq: Two times a day (BID) | ORAL | Status: DC

## 2014-03-30 MED ORDER — RANITIDINE HCL 150 MG PO TABS: 150.0000 mg | ORAL_TABLET | Freq: Two times a day (BID) | ORAL | Status: DC

## 2014-03-30 MED ORDER — SERTRALINE HCL 100 MG PO TABS: 100.0000 mg | ORAL_TABLET | Freq: Every evening | ORAL | Status: DC

## 2014-03-30 NOTE — Telephone Encounter (Signed)
Notified pt and he voices understanding. Pt also requests expedited refills of sertraline, ranitidine and metoprolol to mail order. Refills sent. Advised pt to let us know if he runs out of medication before mail order supply arrives and we can send short term rxs to local pharmacy. Pt voices understanding.

## 2014-04-06 ENCOUNTER — Other Ambulatory Visit: Payer: Self-pay | Admitting: Physician Assistant

## 2014-04-07 ENCOUNTER — Ambulatory Visit (INDEPENDENT_AMBULATORY_CARE_PROVIDER_SITE_OTHER): Payer: Medicare PPO | Admitting: *Deleted

## 2014-04-07 ENCOUNTER — Encounter: Payer: Self-pay | Admitting: Family

## 2014-04-07 ENCOUNTER — Ambulatory Visit (INDEPENDENT_AMBULATORY_CARE_PROVIDER_SITE_OTHER): Payer: Medicare PPO | Admitting: Family

## 2014-04-07 VITALS — BP 150/100 | HR 63 | Temp 97.6°F | Resp 16 | Ht 71.5 in | Wt 234.0 lb

## 2014-04-07 DIAGNOSIS — G4733 Obstructive sleep apnea (adult) (pediatric): Secondary | ICD-10-CM

## 2014-04-07 DIAGNOSIS — E785 Hyperlipidemia, unspecified: Secondary | ICD-10-CM

## 2014-04-07 DIAGNOSIS — D649 Anemia, unspecified: Secondary | ICD-10-CM

## 2014-04-07 DIAGNOSIS — I10 Essential (primary) hypertension: Secondary | ICD-10-CM

## 2014-04-07 DIAGNOSIS — M19049 Primary osteoarthritis, unspecified hand: Secondary | ICD-10-CM

## 2014-04-07 DIAGNOSIS — N400 Enlarged prostate without lower urinary tract symptoms: Secondary | ICD-10-CM

## 2014-04-07 DIAGNOSIS — Z23 Encounter for immunization: Secondary | ICD-10-CM

## 2014-04-07 NOTE — Progress Notes (Signed)
Subjective:    Patient ID: Mark [name], male    DOB: 20-Jan-1941, 73 y.o.   MRN: [id number]  HPI  Mark [name] is a 73 yr old male who presents today for follow up.  1) HTN- BP meds. Home BP readings generally 120's over 60's to 80's Had one high reading 168/101. BP Readings from Last 3 Encounters:  04/07/14 150/100  03/18/14 126/88  11/18/13 146/90   2) Hyperlipidemia-   Lab Results  Component Value Date   CHOL 165 11/18/2013   HDL 37* 11/18/2013   LDLCALC 107* 11/18/2013   TRIG 103 11/18/2013   CHOLHDL 4.5 11/18/2013   3) Depression-maintained on zoloft.  Stable.  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 His right hand dominant.   5) BPH- on flomax.  Reports that he can urinate 2-6 times a night.   Lab Results  Component Value Date   PSA 2.13 07/25/2012   PSA 2.28 11/10/2010   PSA 1.55 02/22/2010   6) OSA- will start BiPap.   Review of Systems See HPI  Past Medical History  Diagnosis Date  . Hypertension   . Hyperlipidemia   . CAD (coronary artery disease)   . History of nephrolithiasis   . GERD (gastroesophageal reflux disease)   . Depression   . History of skin cancer   . History of pneumothorax     LONG TIME AGO  . Atrial fibrillation   . Dysrhythmia     AF  . Left renal mass   . Chronic kidney disease     CHRONIC RENAL INSUFFICIENCY  . Arthritis     LEFT HAND, RT KNEE  . Cancer     BASAL CELL CA OF NOSE  . Shortness of breath   . Myocardial infarction 2006  . Shingles outbreak     PT NOTICED SKIN RASH RT GROIN AND RIGHT TRUNK ON MONDAY 10/27/13 - NOT A LOT OF DISCOMFORT- NOT ON ANY ORAL MEDS TO TREAT.  Marland Kitchen AAA (abdominal aortic aneurysm)     9.1 cm repair 05/2009 ( ENDOVASCULAR REPAIR ).  MORE RECENT RE-EVALUATION OF ENLARGING AAA BY DR. Trula Slade AND BY DR. Kathlene Cote  MAY 2015 - PT STATES HE UNDERSTANDS THAT NOTHING IS LEAKING AT PRESENT TIME AND HE IS TO FOLLOW UP WITH HIS DOCTORS FOR FUTURE MONITORING I    History   Social History  .  Marital Status: Divorced    Spouse Name: N/A    Number of Children: 2  . Years of Education: N/A   Occupational History  .     Social History Main Topics  . Smoking status: Former Smoker    Quit date: 08/24/1980  . Smokeless tobacco: Never Used     Comment: quit   . Alcohol Use: 1.5 oz/week    3 drink(s) per week     Comment: Occasional  . Drug Use: No  . Sexual Activity: Not on file   Other Topics Concern  . Not on file   Social History Narrative   Retired   Divorced   2 sons 14 &30     Occupation:  Part time sports broadcaster    Smoking Status:  quit (08/24/1980)   Packs/Day:  1.0    Caffeine use/day:  3 beverages daily    Does Patient Exercise:  no   Alcohol Use - yes    Past Surgical History  Procedure Laterality Date  . Cardiac catheterization  06/2004  . Abdominal aortic aneurysm repair  [facility]  2011    ENDOVASCULAR - DR. BRABHAM  . Tonsillectomy    . Athroscopic knee surgery      RIGHT KNEE  . Lithotripsy    . Robot assisted laparoscopic nephrectomy Left 11/06/2013    Procedure: ROBOTIC ASSISTED LAPAROSCOPIC RADICAL  NEPHRECTOMY;  Surgeon: Alexis Frock, MD;  Location: [facility];  Service: Urology;  Laterality: Left;    Family History  Problem Relation Age of Onset  . Coronary artery disease    . Hyperlipidemia    . Hypertension    . Pulmonary embolism Mother     died of PE  . Prostate cancer    . Anuerysm Father     history of popliteal  . Other Neg Hx     polycystic kidney disease, and colon cancer  . Colon cancer Neg Hx     Allergies  Allergen Reactions  . Ace Inhibitors     REACTION: Cough    Current Outpatient Prescriptions on File Prior to Visit  Medication Sig Dispense Refill  . amLODipine (NORVASC) 10 MG tablet Take 10 mg by mouth every evening.    Marland Kitchen apixaban (ELIQUIS) 5 MG TABS tablet Take 1 tablet (5 mg total) by mouth 2 (two) times daily. 60 tablet   . atorvastatin (LIPITOR) 40 MG tablet TAKE 1 TABLET ONE TIME DAILY 90 tablet 0  .  Coenzyme Q10 200 MG capsule Take 200 mg by mouth daily.    . furosemide (LASIX) 40 MG tablet Take 40 mg by mouth daily with lunch.    . isosorbide dinitrate (ISORDIL) 20 MG tablet Take 20 mg by mouth 3 (three) times daily.    Marland Kitchen losartan (COZAAR) 50 MG tablet Take 1 tablet (50 mg total) by mouth every evening. 90 tablet 1  . metoprolol (LOPRESSOR) 100 MG tablet Take 1 tablet (100 mg total) by mouth 2 (two) times daily. 180 tablet 1  . Multiple Vitamin (MULTIVITAMIN) tablet Take 1 tablet by mouth daily.      . Omega-3 Fatty Acids (FISH OIL) 1200 MG CAPS Take 1,200 mg by mouth 2 (two) times daily.     . ranitidine (ZANTAC) 150 MG tablet Take 1 tablet (150 mg total) by mouth 2 (two) times daily. 180 tablet 1  . sertraline (ZOLOFT) 100 MG tablet Take 1 tablet (100 mg total) by mouth every evening. 90 tablet 1  . tamsulosin (FLOMAX) 0.4 MG CAPS capsule Take 1 capsule (0.4 mg total) by mouth daily after breakfast. 90 capsule 1   No current facility-administered medications on file prior to visit.    BP 150/100 mmHg  Pulse 63  Temp(Src) 97.6 F (36.4 C) (Oral)  Resp 16  Ht 5' 11.5" (1.816 m)  Wt 234 lb (106.142 kg)  BMI 32.19 kg/m2  SpO2 94%       Objective:   Physical Exam  Constitutional: He is oriented to person, place, and time. He appears well-developed and well-nourished. No distress.  HENT:  Head: Normocephalic and atraumatic.  Cardiovascular: Normal rate and regular rhythm.   No murmur heard. Pulmonary/Chest: Effort normal and breath sounds normal. No respiratory distress. He has no wheezes. He has no rales. He exhibits no tenderness.  Neurological: He is alert and oriented to person, place, and time.  Psychiatric: He has a normal mood and affect. His behavior is normal. Judgment and thought content normal.          Assessment & Plan:

## 2014-04-07 NOTE — Patient Instructions (Addendum)
Please complete lab work prior to leaving. Complete stool study and return by mail.  Follow up in 3 months.

## 2014-04-08 LAB — BASIC METABOLIC PANEL
BUN: 35 mg/dL — ABNORMAL HIGH (ref 6–23)
CO2: 29 mEq/L (ref 19–32)
Calcium: 9.2 mg/dL (ref 8.4–10.5)
Chloride: 104 mEq/L (ref 96–112)
Creatinine, Ser: 2 mg/dL — ABNORMAL HIGH (ref 0.4–1.5)
GFR: 34.79 mL/min — ABNORMAL LOW (ref >60.00)
Glucose, Bld: 67 mg/dL — ABNORMAL LOW (ref 70–99)
Potassium: 4.5 mEq/L (ref 3.5–5.1)
Sodium: 138 mEq/L (ref 135–145)

## 2014-04-09 DIAGNOSIS — D649 Anemia, unspecified: Secondary | ICD-10-CM | POA: Insufficient documentation

## 2014-04-09 DIAGNOSIS — M19049 Primary osteoarthritis, unspecified hand: Secondary | ICD-10-CM | POA: Insufficient documentation

## 2014-04-09 NOTE — Assessment & Plan Note (Signed)
Readings better at home.  Continue current meds.  Pt will continue to monitor.

## 2014-04-09 NOTE — Assessment & Plan Note (Signed)
Pt will start bipap soon.

## 2014-04-09 NOTE — Assessment & Plan Note (Signed)
Continue statin- nearly at goal. Monitor.

## 2014-04-09 NOTE — Assessment & Plan Note (Signed)
Mild- Likely related to renal insufficiency. Obtain ifob.

## 2014-04-09 NOTE — Assessment & Plan Note (Signed)
Declines med changes at this time. Wishes to see of his nocturia improves after initiation of bipap.

## 2014-04-09 NOTE — Assessment & Plan Note (Signed)
Advised pt to use tylenol prn.  Avoid aspirin/nsaids due to renal hx.

## 2014-04-15 ENCOUNTER — Telehealth: Payer: Self-pay

## 2014-04-15 NOTE — Telephone Encounter (Signed)
Patient called office to get samples of eliquis will give to patient when he arrives to office

## 2014-04-16 ENCOUNTER — Encounter (HOSPITAL_COMMUNITY): Payer: Self-pay | Admitting: Surgery

## 2014-04-20 ENCOUNTER — Telehealth: Payer: Self-pay | Admitting: Family

## 2014-04-20 NOTE — Telephone Encounter (Signed)
Spoke with pt. He reports productive cough (cloudy in color) developed last Thursday. Cough has worsened the last 2 days. Has nasal congestion, denies sinus drainage or sore throat. No confirmed fever at home but states he has felt a little warm. Has had some sneezing. "Doesn't feel like moving around". Sounds slightly hoarse while talking. Has not tried any otc treatments.  Please advise.

## 2014-04-20 NOTE — Telephone Encounter (Signed)
rec OV for evaluation.

## 2014-04-20 NOTE — Telephone Encounter (Signed)
Caller name: Javius, Sylla Relation to pt: self  Call back number: [phone]   Reason for call:  Pt is experiencing productive cough ever since flu shot was given 04/07/14. In need of clinical advice (pt states he is without a car and can not take the bus at the current time). Seeking RX

## 2014-04-22 NOTE — Telephone Encounter (Signed)
Notified pt of below recommendations and apologized for not getting back with pt as I was out of the office yesterday. Pt became upset with below advice stating he does not have transportation and he will just try OTC options since we can't treat anything over the phone and disconnected the call.

## 2014-04-28 ENCOUNTER — Other Ambulatory Visit: Payer: Self-pay | Admitting: Family

## 2014-04-29 ENCOUNTER — Other Ambulatory Visit: Payer: Self-pay | Admitting: Physician Assistant

## 2014-04-29 NOTE — Telephone Encounter (Signed)
Medication Detail      Disp Refills Start End     atorvastatin (LIPITOR) 40 MG tablet 90 tablet 0 03/02/2014     Sig: TAKE 1 TABLET ONE TIME DAILY    Notes to Pharmacy: Patient due for follow-up and liver function testing.    E-Prescribing Status: Receipt confirmed by pharmacy (03/02/2014 7:12 AM EDT)     Pharmacy    [facility] [place], [place]

## 2014-05-05 ENCOUNTER — Ambulatory Visit (HOSPITAL_COMMUNITY)
Admission: RE | Admit: 2014-05-05 | Discharge: 2014-05-05 | Disposition: A | Discharge status: Home / Self Care | Payer: Medicare PPO | Source: Ambulatory Visit | Attending: Urology | Admitting: Urology

## 2014-05-05 ENCOUNTER — Other Ambulatory Visit (HOSPITAL_COMMUNITY): Payer: Self-pay | Admitting: Urology

## 2014-05-05 DIAGNOSIS — C649 Malignant neoplasm of unspecified kidney, except renal pelvis: Secondary | ICD-10-CM

## 2014-05-05 DIAGNOSIS — Z85528 Personal history of other malignant neoplasm of kidney: Secondary | ICD-10-CM | POA: Insufficient documentation

## 2014-05-05 DIAGNOSIS — Z905 Acquired absence of kidney: Secondary | ICD-10-CM | POA: Diagnosis not present

## 2014-05-05 DIAGNOSIS — R05 Cough: Secondary | ICD-10-CM | POA: Diagnosis present

## 2014-05-05 DIAGNOSIS — J439 Emphysema, unspecified: Secondary | ICD-10-CM | POA: Diagnosis not present

## 2014-05-15 ENCOUNTER — Telehealth: Payer: Self-pay

## 2014-05-15 NOTE — Telephone Encounter (Signed)
Patient called for samples of eliquis placed a month supply up front

## 2014-06-16 ENCOUNTER — Ambulatory Visit (INDEPENDENT_AMBULATORY_CARE_PROVIDER_SITE_OTHER): Payer: Medicare PPO | Admitting: Family

## 2014-06-16 ENCOUNTER — Encounter: Payer: Self-pay | Admitting: Family

## 2014-06-16 VITALS — BP 140/100 | HR 81 | Temp 97.6°F | Resp 16 | Ht 71.5 in | Wt 230.4 lb

## 2014-06-16 DIAGNOSIS — F329 Major depressive disorder, single episode, unspecified: Secondary | ICD-10-CM

## 2014-06-16 DIAGNOSIS — E785 Hyperlipidemia, unspecified: Secondary | ICD-10-CM

## 2014-06-16 DIAGNOSIS — G4733 Obstructive sleep apnea (adult) (pediatric): Secondary | ICD-10-CM

## 2014-06-16 DIAGNOSIS — F32A Depression, unspecified: Secondary | ICD-10-CM

## 2014-06-16 DIAGNOSIS — C649 Malignant neoplasm of unspecified kidney, except renal pelvis: Secondary | ICD-10-CM

## 2014-06-16 DIAGNOSIS — I480 Paroxysmal atrial fibrillation: Secondary | ICD-10-CM

## 2014-06-16 DIAGNOSIS — I10 Essential (primary) hypertension: Secondary | ICD-10-CM

## 2014-06-16 LAB — LIPID PANEL
CHOL/HDL RATIO: 3
CHOLESTEROL: 126 mg/dL (ref 0–200)
HDL: 40 mg/dL (ref >39.00)
LDL Cholesterol: 67 mg/dL (ref 0–99)
NonHDL: 86
TRIGLYCERIDES: 95 mg/dL (ref 0.0–149.0)
VLDL: 19 mg/dL (ref 0.0–40.0)

## 2014-06-16 NOTE — Assessment & Plan Note (Signed)
Home readings are much better than office reading today. Continue current meds.

## 2014-06-16 NOTE — Assessment & Plan Note (Signed)
Tolerating statin, obtain flp

## 2014-06-16 NOTE — Progress Notes (Signed)
Pre visit review using our clinic review tool, if applicable. No additional management support is needed unless otherwise documented below in the visit note. 

## 2014-06-16 NOTE — Patient Instructions (Signed)
Please complete lab work prior to leaving.   Please schedule a follow up appointment in 3 months.  

## 2014-06-16 NOTE — Assessment & Plan Note (Signed)
S/p nephrectomy, surveillance per urology- Dr Tresa Moore

## 2014-06-16 NOTE — Assessment & Plan Note (Signed)
Rate stable, management per cardiology.   

## 2014-06-16 NOTE — Assessment & Plan Note (Signed)
Not using bipap, he will contact dme company to arrange.

## 2014-06-16 NOTE — Progress Notes (Signed)
Subjective:    Patient ID: Mark [name], male    DOB: July 23, 1940, 74 y.o.   MRN: [id number]  HPI Mr. Mark [name] is here today for follow up. 1. Hyperlipidemia: Reports compliance with atorvastatin. Denies myalgia. Diet: tries to avoid salt and fat but does not always succeed. Exercise: reports no formal exercise. Some walking. Lab Results  Component Value Date   CHOL 165 11/18/2013   HDL 37* 11/18/2013   LDLCALC 107* 11/18/2013   TRIG 103 11/18/2013   CHOLHDL 4.5 11/18/2013   2. HYPERTENSION: Reports compliance with amlodipine, metoprolol, losartan. At home, runs 110s/60-80s. Denies chest pain, SOB, or lower extremity edema.  BP Readings from Last 3 Encounters:  06/16/14 140/100  04/07/14 150/100  03/18/14 126/88   3. Depression: Reports some depression over holidays. Is estranged from son and consequently his grandson. Overall feels that sertaline is working for him however.  4. OSA: does not wear CPAP. Reports that he is planning to investigate using CPAP again with the medical supply company.  5. A Fib- taking Eliquis for A fib. Sees Dr. Stanford Breed. Last saw him 03/2014. He is also following him for AAA. Last CAT scan in 05/2014. No intervention needed at this time.    6. Renal Cell Carcinoma- now following with Dr. Tresa Moore (urology) Q6 months. Had bmet with his urologist on 12/29- he brings results and his creatinine was 2.2.  Review of Systems    see HPI  Past Medical History  Diagnosis Date  . Hypertension   . Hyperlipidemia   . CAD (coronary artery disease)   . History of nephrolithiasis   . GERD (gastroesophageal reflux disease)   . Depression   . History of skin cancer   . History of pneumothorax     LONG TIME AGO  . Atrial fibrillation   . Dysrhythmia     AF  . Left renal mass   . Chronic kidney disease     CHRONIC RENAL INSUFFICIENCY  . Arthritis     LEFT HAND, RT KNEE  . Cancer     BASAL CELL CA OF NOSE  . Shortness of breath   . Myocardial  infarction 2006  . Shingles outbreak     PT NOTICED SKIN RASH RT GROIN AND RIGHT TRUNK ON MONDAY 10/27/13 - NOT A LOT OF DISCOMFORT- NOT ON ANY ORAL MEDS TO TREAT.  Marland Kitchen AAA (abdominal aortic aneurysm)     9.1 cm repair 05/2009 ( ENDOVASCULAR REPAIR ).  MORE RECENT RE-EVALUATION OF ENLARGING AAA BY DR. Trula Slade AND BY DR. Kathlene Cote  MAY 2015 - PT STATES HE UNDERSTANDS THAT NOTHING IS LEAKING AT PRESENT TIME AND HE IS TO FOLLOW UP WITH HIS DOCTORS FOR FUTURE MONITORING I    History   Social History  . Marital Status: Divorced    Spouse Name: N/A    Number of Children: 2  . Years of Education: N/A   Occupational History  .     Social History Main Topics  . Smoking status: Former Smoker    Quit date: 08/24/1980  . Smokeless tobacco: Never Used     Comment: quit   . Alcohol Use: 1.5 oz/week    3 drink(s) per week     Comment: Occasional  . Drug Use: No  . Sexual Activity: Not on file   Other Topics Concern  . Not on file   Social History Narrative   Retired   Divorced   2 sons 109 &30     Occupation:  Part time sports broadcaster    Smoking Status:  quit (08/24/1980)   Packs/Day:  1.0    Caffeine use/day:  3 beverages daily    Does Patient Exercise:  no   Alcohol Use - yes    Past Surgical History  Procedure Laterality Date  . Cardiac catheterization  06/2004  . Abdominal aortic aneurysm repair  JAN 2011    ENDOVASCULAR - DR. BRABHAM  . Tonsillectomy    . Athroscopic knee surgery      RIGHT KNEE  . Lithotripsy    . Robot assisted laparoscopic nephrectomy Left 11/06/2013    Procedure: ROBOTIC ASSISTED LAPAROSCOPIC RADICAL  NEPHRECTOMY;  Surgeon: Alexis Frock, MD;  Location: [facility];  Service: Urology;  Laterality: Left;  . Abdominal aortagram N/A 09/24/2013    Procedure: ABDOMINAL Maxcine Ham;  Surgeon: Serafina Mitchell, MD;  Location: [facility] C[facility];  Service: Cardiovascular;  Laterality: N/A;    Family History  Problem Relation Age of Onset  . Coronary artery disease    .  Hyperlipidemia    . Hypertension    . Pulmonary embolism Mother     died of PE  . Prostate cancer    . Anuerysm Father     history of popliteal  . Other Neg Hx     polycystic kidney disease, and colon cancer  . Colon cancer Neg Hx     Allergies  Allergen Reactions  . Ace Inhibitors     REACTION: Cough    Current Outpatient Prescriptions on File Prior to Visit  Medication Sig Dispense Refill  . amLODipine (NORVASC) 10 MG tablet Take 10 mg by mouth every evening.    Marland Kitchen apixaban (ELIQUIS) 5 MG TABS tablet Take 1 tablet (5 mg total) by mouth 2 (two) times daily. 60 tablet   . atorvastatin (LIPITOR) 40 MG tablet TAKE 1 TABLET ONE TIME DAILY (DUE FOR FOLLOW UP AND LIVER FUNCTION TESTING) 90 tablet 0  . Coenzyme Q10 200 MG capsule Take 200 mg by mouth daily.    . furosemide (LASIX) 40 MG tablet Take 40 mg by mouth daily with lunch.    . isosorbide dinitrate (ISORDIL) 20 MG tablet Take 20 mg by mouth 3 (three) times daily.    Marland Kitchen losartan (COZAAR) 50 MG tablet TAKE 1 TABLET EVERY EVENING 90 tablet 1  . metoprolol (LOPRESSOR) 100 MG tablet Take 1 tablet (100 mg total) by mouth 2 (two) times daily. 180 tablet 1  . Multiple Vitamin (MULTIVITAMIN) tablet Take 1 tablet by mouth daily.      . Omega-3 Fatty Acids (FISH OIL) 1200 MG CAPS Take 1,200 mg by mouth 2 (two) times daily.     . ranitidine (ZANTAC) 150 MG tablet Take 1 tablet (150 mg total) by mouth 2 (two) times daily. 180 tablet 1  . sertraline (ZOLOFT) 100 MG tablet Take 1 tablet (100 mg total) by mouth every evening. 90 tablet 1  . tamsulosin (FLOMAX) 0.4 MG CAPS capsule TAKE 1 CAPSULE (0.4 MG TOTAL) DAILY AFTER BREAKFAST. 90 capsule 1   No current facility-administered medications on file prior to visit.    BP 140/100 mmHg  Pulse 81  Temp(Src) 97.6 F (36.4 C) (Oral)  Resp 16  Ht 5' 11.5" (1.816 m)  Wt 230 lb 6.4 oz (104.509 kg)  BMI 31.69 kg/m2  SpO2 95%    Objective:   Physical Exam  Constitutional: He is oriented to  person, place, and time. He appears well-developed and well-nourished. No distress.  HENT:  Head: Normocephalic and atraumatic.  Cardiovascular: Normal rate and regular rhythm.   No murmur heard. Pulmonary/Chest: Effort normal and breath sounds normal. No respiratory distress. He has no wheezes. He has no rales.  Musculoskeletal: He exhibits no edema.  Neurological: He is alert and oriented to person, place, and time.  Skin: Skin is warm and dry.  Psychiatric: He has a normal mood and affect. His behavior is normal. Thought content normal.          Assessment & Plan:

## 2014-06-16 NOTE — Assessment & Plan Note (Signed)
Stable, continue ssri

## 2014-06-17 ENCOUNTER — Encounter: Payer: Self-pay | Admitting: Family

## 2014-06-24 ENCOUNTER — Telehealth: Payer: Self-pay | Admitting: Family

## 2014-06-24 NOTE — Telephone Encounter (Signed)
Caller name: Nomar Relation to pt: self Call back number: [phone] Pharmacy:  Reason for call:   Patient states that [facility] wants him to schedule another sleep study because it has been too long since last sleep study. Patient does not want to have another sleep study because of cost.

## 2014-06-24 NOTE — Telephone Encounter (Signed)
Pt had sleep study in November 2015. Please contact [facility] and advise them of this. Please fax them a copy of his sleep study as well.  Recommendations are as follows:     BiPAP can be initiated at 21/17 centimeters with a medium fullface mask

## 2014-06-26 NOTE — Telephone Encounter (Signed)
Spoke with Mark [name] at [facility] and faxed recent sleep study, office note of 11/18/13 to [phone]. Notified pt.

## 2014-07-09 ENCOUNTER — Telehealth: Payer: Self-pay

## 2014-07-09 NOTE — Telephone Encounter (Signed)
patient called for samples of eliquis placed them up front

## 2014-07-17 ENCOUNTER — Encounter: Payer: Self-pay | Admitting: Family

## 2014-07-17 ENCOUNTER — Ambulatory Visit (INDEPENDENT_AMBULATORY_CARE_PROVIDER_SITE_OTHER): Payer: Medicare PPO | Admitting: Family

## 2014-07-17 VITALS — BP 130/94 | HR 66 | Temp 97.6°F | Resp 18 | Ht 71.5 in | Wt 232.0 lb

## 2014-07-17 DIAGNOSIS — N400 Enlarged prostate without lower urinary tract symptoms: Secondary | ICD-10-CM

## 2014-07-17 DIAGNOSIS — R351 Nocturia: Secondary | ICD-10-CM

## 2014-07-17 DIAGNOSIS — I10 Essential (primary) hypertension: Secondary | ICD-10-CM

## 2014-07-17 LAB — URINALYSIS, ROUTINE W REFLEX MICROSCOPIC
Bilirubin Urine: NEGATIVE
HGB URINE DIPSTICK: NEGATIVE
Ketones, ur: NEGATIVE
Leukocytes, UA: NEGATIVE
Nitrite: NEGATIVE
Specific Gravity, Urine: 1.02 (ref 1.000–1.030)
Total Protein, Urine: 30 — AB
URINE GLUCOSE: NEGATIVE
UROBILINOGEN UA: 0.2 (ref 0.0–1.0)
pH: 6 (ref 5.0–8.0)

## 2014-07-17 LAB — PSA: PSA: 2.16 ng/mL (ref 0.10–4.00)

## 2014-07-17 NOTE — Progress Notes (Signed)
Subjective:    Patient ID: Mark [name], male    DOB: 1940-08-08, 74 y.o.   MRN: [id number]  HPI  HTN- Patient is currently maintained on the following medications for blood pressure: amlodipine, losartan, metoprolol, lasix Patient reports good compliance with blood pressure medications. Patient denies chest pain, shortness of breath. Does report LE edema with prolonged standing.  Some DOE which has improved. He associates with deconditioning. Reports that at home he has had a "couple of high readings.  Last 3 blood pressure readings in our office are as follows:  BP Readings from Last 3 Encounters:  07/17/14 130/94  06/16/14 140/100  04/07/14 150/100   Nocturia- 4-7 times a night.  + Polyuria.  He is seeing Dr. Posey Pronto for Urology.  He is on flomax.    Review of Systems See HPI  Past Medical History  Diagnosis Date  . Hypertension   . Hyperlipidemia   . CAD (coronary artery disease)   . History of nephrolithiasis   . GERD (gastroesophageal reflux disease)   . Depression   . History of skin cancer   . History of pneumothorax     LONG TIME AGO  . Atrial fibrillation   . Dysrhythmia     AF  . Renal cell carcinoma   . Chronic kidney disease     CHRONIC RENAL INSUFFICIENCY  . Arthritis     LEFT HAND, RT KNEE  . Cancer     BASAL CELL CA OF NOSE  . Shortness of breath   . Myocardial infarction 2006  . Shingles outbreak     PT NOTICED SKIN RASH RT GROIN AND RIGHT TRUNK ON MONDAY 10/27/13 - NOT A LOT OF DISCOMFORT- NOT ON ANY ORAL MEDS TO TREAT.  Marland Kitchen AAA (abdominal aortic aneurysm)     9.1 cm repair 05/2009 ( ENDOVASCULAR REPAIR ).  MORE RECENT RE-EVALUATION OF ENLARGING AAA BY DR. Trula Slade AND BY DR. Kathlene Cote  MAY 2015 - PT STATES HE UNDERSTANDS THAT NOTHING IS LEAKING AT PRESENT TIME AND HE IS TO FOLLOW UP WITH HIS DOCTORS FOR FUTURE MONITORING I    History   Social History  . Marital Status: Divorced    Spouse Name: N/A  . Number of Children: 2  . Years of Education:  N/A   Occupational History  .     Social History Main Topics  . Smoking status: Former Smoker    Quit date: 08/24/1980  . Smokeless tobacco: Never Used     Comment: quit   . Alcohol Use: 1.5 oz/week    3 drink(s) per week     Comment: Occasional  . Drug Use: No  . Sexual Activity: Not on file   Other Topics Concern  . Not on file   Social History Narrative   Retired   Divorced   2 sons 75 &30     Occupation:  Part time sports broadcaster    Smoking Status:  quit (08/24/1980)   Packs/Day:  1.0    Caffeine use/day:  3 beverages daily    Does Patient Exercise:  no   Alcohol Use - yes    Past Surgical History  Procedure Laterality Date  . Cardiac catheterization  06/2004  . Abdominal aortic aneurysm repair  JAN 2011    ENDOVASCULAR - DR. BRABHAM  . Tonsillectomy    . Athroscopic knee surgery      RIGHT KNEE  . Lithotripsy    . Robot assisted laparoscopic nephrectomy Left 11/06/2013  Procedure: ROBOTIC ASSISTED LAPAROSCOPIC RADICAL  NEPHRECTOMY;  Surgeon: Alexis Frock, MD;  Location: [facility];  Service: Urology;  Laterality: Left;  . Abdominal aortagram N/A 09/24/2013    Procedure: ABDOMINAL Maxcine Ham;  Surgeon: Serafina Mitchell, MD;  Location: [facility] CATH LAB;  Service: Cardiovascular;  Laterality: N/A;  . Nephrectomy      Family History  Problem Relation Age of Onset  . Coronary artery disease    . Hyperlipidemia    . Hypertension    . Pulmonary embolism Mother     died of PE  . Prostate cancer    . Anuerysm Father     history of popliteal  . Other Neg Hx     polycystic kidney disease, and colon cancer  . Colon cancer Neg Hx     Allergies  Allergen Reactions  . Ace Inhibitors     REACTION: Cough    Current Outpatient Prescriptions on File Prior to Visit  Medication Sig Dispense Refill  . amLODipine (NORVASC) 10 MG tablet Take 10 mg by mouth every evening.    Marland Kitchen apixaban (ELIQUIS) 5 MG TABS tablet Take 1 tablet (5 mg total) by mouth 2 (two) times daily. 60  tablet   . atorvastatin (LIPITOR) 40 MG tablet TAKE 1 TABLET ONE TIME DAILY (DUE FOR FOLLOW UP AND LIVER FUNCTION TESTING) 90 tablet 0  . Coenzyme Q10 200 MG capsule Take 200 mg by mouth daily.    . furosemide (LASIX) 40 MG tablet Take 40 mg by mouth daily with lunch.    . isosorbide dinitrate (ISORDIL) 20 MG tablet Take 20 mg by mouth 3 (three) times daily.    Marland Kitchen losartan (COZAAR) 50 MG tablet TAKE 1 TABLET EVERY EVENING 90 tablet 1  . metoprolol (LOPRESSOR) 100 MG tablet Take 1 tablet (100 mg total) by mouth 2 (two) times daily. 180 tablet 1  . Multiple Vitamin (MULTIVITAMIN) tablet Take 1 tablet by mouth daily.      . Omega-3 Fatty Acids (FISH OIL) 1200 MG CAPS Take 1,200 mg by mouth 2 (two) times daily.     . ranitidine (ZANTAC) 150 MG tablet Take 1 tablet (150 mg total) by mouth 2 (two) times daily. 180 tablet 1  . sertraline (ZOLOFT) 100 MG tablet Take 1 tablet (100 mg total) by mouth every evening. 90 tablet 1  . tamsulosin (FLOMAX) 0.4 MG CAPS capsule TAKE 1 CAPSULE (0.4 MG TOTAL) DAILY AFTER BREAKFAST. 90 capsule 1   No current facility-administered medications on file prior to visit.    BP 130/94 mmHg  Pulse 66  Temp(Src) 97.6 F (36.4 C) (Oral)  Resp 18  Ht 5' 11.5" (1.816 m)  Wt 232 lb (105.235 kg)  BMI 31.91 kg/m2  SpO2 95%       Objective:   Physical Exam  Constitutional: He is oriented to person, place, and time. He appears well-developed and well-nourished. No distress.  HENT:  Head: Normocephalic and atraumatic.  Cardiovascular: Normal rate and regular rhythm.   No murmur heard. Pulmonary/Chest: Effort normal and breath sounds normal. No respiratory distress. He has no wheezes. He has no rales.  Musculoskeletal: He exhibits no edema.  Neurological: He is alert and oriented to person, place, and time.  Skin: Skin is warm and dry.  Psychiatric: He has a normal mood and affect. His behavior is normal. Thought content normal.          Assessment & Plan:

## 2014-07-17 NOTE — Progress Notes (Signed)
Pre visit review using our clinic review tool, if applicable. No additional management support is needed unless otherwise documented below in the visit note. 

## 2014-07-17 NOTE — Patient Instructions (Signed)
Please complete lab work prior to leaving. Follow up in 3 months.  

## 2014-07-18 NOTE — Assessment & Plan Note (Signed)
Due to polyuria will check UA, culture and PSA, continue flomax. Advised pt to arrange follow up with urology.

## 2014-07-18 NOTE — Assessment & Plan Note (Signed)
BP is improved today. I have asked the pt to continue to monitor his BP's at home and call if consistent BP's greater than 140/90.

## 2014-07-19 ENCOUNTER — Encounter: Payer: Self-pay | Admitting: Family

## 2014-07-19 LAB — URINE CULTURE
Colony Count: NO GROWTH
ORGANISM ID, BACTERIA: NO GROWTH

## 2014-08-03 ENCOUNTER — Other Ambulatory Visit: Payer: Self-pay | Admitting: Family

## 2014-08-03 NOTE — Telephone Encounter (Signed)
Caller name: Arianna Relation to pt: self Call back number: [phone] [facility]acy: [facility]  Reason for call:   Requesting furosemide refill

## 2014-08-04 MED ORDER — FUROSEMIDE 40 MG PO TABS: 40.0000 mg | ORAL_TABLET | Freq: Every day | ORAL | Status: DC

## 2014-08-04 NOTE — Telephone Encounter (Signed)
Rx sent.  Per last office visit, patient supposed to schedule follow-up with urologist.  Called patient to make him aware-Patient states that he will arrange follow-up with urologist.  CPE scheduled

## 2014-08-07 ENCOUNTER — Telehealth: Payer: Self-pay

## 2014-08-07 NOTE — Telephone Encounter (Signed)
Patient called to get samples of eliquis 5 mg placed samples up front

## 2014-08-21 ENCOUNTER — Telehealth: Payer: Self-pay | Admitting: Family

## 2014-08-21 NOTE — Telephone Encounter (Signed)
Pre Visit letter sent  °

## 2014-09-07 ENCOUNTER — Encounter: Payer: Self-pay | Admitting: Family

## 2014-09-07 DIAGNOSIS — M8580 Other specified disorders of bone density and structure, unspecified site: Secondary | ICD-10-CM | POA: Insufficient documentation

## 2014-09-08 ENCOUNTER — Encounter: Payer: Self-pay | Admitting: *Deleted

## 2014-09-08 ENCOUNTER — Telehealth: Payer: Self-pay | Admitting: *Deleted

## 2014-09-08 NOTE — Telephone Encounter (Signed)
Pre-Visit Call completed with patient and chart updated.   Pre-Visit Info documented in Specialty Comments under [employer].    

## 2014-09-09 ENCOUNTER — Ambulatory Visit (INDEPENDENT_AMBULATORY_CARE_PROVIDER_SITE_OTHER): Payer: Medicare PPO | Admitting: Family

## 2014-09-09 ENCOUNTER — Encounter: Payer: Self-pay | Admitting: Family

## 2014-09-09 VITALS — BP 128/70 | HR 67 | Temp 97.6°F | Resp 16 | Ht 71.0 in | Wt 233.4 lb

## 2014-09-09 DIAGNOSIS — Z0000 Encounter for general adult medical examination without abnormal findings: Secondary | ICD-10-CM

## 2014-09-09 MED ORDER — ATORVASTATIN CALCIUM 40 MG PO TABS: ORAL_TABLET | ORAL | Status: DC

## 2014-09-09 MED ORDER — LOSARTAN POTASSIUM 50 MG PO TABS: 50.0000 mg | ORAL_TABLET | Freq: Every evening | ORAL | Status: DC

## 2014-09-09 MED ORDER — TAMSULOSIN HCL 0.4 MG PO CAPS: ORAL_CAPSULE | ORAL | Status: DC

## 2014-09-09 MED ORDER — FUROSEMIDE 40 MG PO TABS: 40.0000 mg | ORAL_TABLET | Freq: Every day | ORAL | Status: DC

## 2014-09-09 MED ORDER — METOPROLOL TARTRATE 100 MG PO TABS: 100.0000 mg | ORAL_TABLET | Freq: Two times a day (BID) | ORAL | Status: DC

## 2014-09-09 MED ORDER — ISOSORBIDE DINITRATE 20 MG PO TABS: 20.0000 mg | ORAL_TABLET | Freq: Three times a day (TID) | ORAL | Status: DC

## 2014-09-09 MED ORDER — SERTRALINE HCL 100 MG PO TABS: 100.0000 mg | ORAL_TABLET | Freq: Every evening | ORAL | Status: DC

## 2014-09-09 MED ORDER — RANITIDINE HCL 150 MG PO TABS: 150.0000 mg | ORAL_TABLET | Freq: Two times a day (BID) | ORAL | Status: DC

## 2014-09-09 MED ORDER — AMLODIPINE BESYLATE 10 MG PO TABS: 10.0000 mg | ORAL_TABLET | Freq: Every evening | ORAL | Status: DC

## 2014-09-09 NOTE — Patient Instructions (Signed)
Please let me know if you decide to proceed with colonoscopy. Continue regular exercise, work on healthy diet and weight loss. Follow up in 3 months.

## 2014-09-09 NOTE — Progress Notes (Signed)
   Subjective:    Patient ID: Mark [name], male    DOB: 08/24/1940, 74 y.o.   MRN: [id number]  HPI     Review of Systems     Objective:   Physical Exam        Assessment & Plan:

## 2014-09-09 NOTE — Progress Notes (Signed)
Subjective:    Mark [name] is a 74 y.o. male who presents for Medicare Annual/Subsequent preventive examination.   Preventive Screening-Counseling & Management  Tobacco History  Smoking status  . Former Smoker  . Quit date: 08/24/1980  Smokeless tobacco  . Never Used    Comment: quit     Problems Prior to Visit 1. HTN- maintained on amlodipine, isosorbide, losartan, metoprolol.  BP Readings from Last 3 Encounters:  09/09/14 128/70  07/17/14 130/94  06/16/14 140/100   2. GERD- maintained on zantac, reports reflux is generally well controlled.   3.  Depression- maintained on zoloft.  Reports that his overall mood is good.  4.  BPH- maintained on flomax. Reports good flow.  Reports nocturia up to 6 x a night, attributes this increased fluid intake. He did see [facility] for consult and has follow up in June.   5. AF- maintained on eliquis.  Followed by cardiology.   5.  Hyperlipidemia- maintained on statin.   Lab Results  Component Value Date   CHOL 126 06/16/2014   HDL 40.00 06/16/2014   LDLCALC 67 06/16/2014   TRIG 95.0 06/16/2014   CHOLHDL 3 06/16/2014   6. Preventative- due for shingles, but declines due to cost.  7.  OSA-  Previously on bipap.  Stopped due to finances.  Declines to resume at this time.    8.  Renal insufficiency- following with nephrology  [place]- 2005, due, will check with his insurance and let me know.   PSA up to date   Current Problems (verified) Patient Active Problem List   Diagnosis Date Noted  . Osteopenia 09/07/2014  . Osteoarthritis, hand 04/09/2014  . Normocytic anemia 04/09/2014  . OSA (obstructive sleep apnea) 01/08/2014  . Daytime somnolence 11/20/2013  . Renal cell carcinoma 11/06/2013  . AAA (abdominal aortic aneurysm) without rupture 09/22/2013  . Skin lesion 01/31/2013  . Atrial fibrillation 07/25/2012  . 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 BPH (benign prostatic hyperplasia) 12/21/2009  .  Abdominal aortic aneurysm 05/28/2009  . RENAL INSUFFICIENCY 05/28/2009  . Hyperlipidemia 11/11/2008  . Depression 11/11/2008  . Essential hypertension 11/11/2008  . Coronary atherosclerosis 11/11/2008  . GERD 11/11/2008  . SKIN CANCER, HX OF 11/11/2008  . NEPHROLITHIASIS, HX OF 11/11/2008    Medications Prior to Visit Current Outpatient Prescriptions on File Prior to Visit  Medication Sig Dispense Refill  . amLODipine (NORVASC) 10 MG tablet Take 10 mg by mouth every evening.    Marland Kitchen apixaban (ELIQUIS) 5 MG TABS tablet Take 1 tablet (5 mg total) by mouth 2 (two) times daily. 60 tablet   . atorvastatin (LIPITOR) 40 MG tablet TAKE 1 TABLET ONE TIME DAILY (DUE FOR FOLLOW UP AND LIVER FUNCTION TESTING) 90 tablet 0  . Coenzyme Q10 200 MG capsule Take 200 mg by mouth daily.    . furosemide (LASIX) 40 MG tablet Take 1 tablet (40 mg total) by mouth daily with lunch. 90 tablet 0  . isosorbide dinitrate (ISORDIL) 20 MG tablet Take 20 mg by mouth 3 (three) times daily.    Marland Kitchen losartan (COZAAR) 50 MG tablet TAKE 1 TABLET EVERY EVENING 90 tablet 1  . metoprolol (LOPRESSOR) 100 MG tablet Take 1 tablet (100 mg total) by mouth 2 (two) times daily. 180 tablet 1  . Multiple Vitamin (MULTIVITAMIN) tablet Take 1 tablet by mouth daily.      . Omega-3 Fatty Acids (FISH OIL) 1200 MG CAPS Take 1,200 mg by mouth 2 (two) times  daily.     . ranitidine (ZANTAC) 150 MG tablet Take 1 tablet (150 mg total) by mouth 2 (two) times daily. 180 tablet 1  . sertraline (ZOLOFT) 100 MG tablet Take 1 tablet (100 mg total) by mouth every evening. 90 tablet 1  . tamsulosin (FLOMAX) 0.4 MG CAPS capsule TAKE 1 CAPSULE (0.4 MG TOTAL) DAILY AFTER BREAKFAST. 90 capsule 1   No current facility-administered medications on file prior to visit.    Current Medications (verified) Current Outpatient Prescriptions  Medication Sig Dispense Refill  . amLODipine (NORVASC) 10 MG tablet Take 10 mg by mouth every evening.    Marland Kitchen apixaban (ELIQUIS) 5  MG TABS tablet Take 1 tablet (5 mg total) by mouth 2 (two) times daily. 60 tablet   . atorvastatin (LIPITOR) 40 MG tablet TAKE 1 TABLET ONE TIME DAILY (DUE FOR FOLLOW UP AND LIVER FUNCTION TESTING) 90 tablet 0  . Coenzyme Q10 200 MG capsule Take 200 mg by mouth daily.    . furosemide (LASIX) 40 MG tablet Take 1 tablet (40 mg total) by mouth daily with lunch. 90 tablet 0  . isosorbide dinitrate (ISORDIL) 20 MG tablet Take 20 mg by mouth 3 (three) times daily.    Marland Kitchen losartan (COZAAR) 50 MG tablet TAKE 1 TABLET EVERY EVENING 90 tablet 1  . metoprolol (LOPRESSOR) 100 MG tablet Take 1 tablet (100 mg total) by mouth 2 (two) times daily. 180 tablet 1  . Multiple Vitamin (MULTIVITAMIN) tablet Take 1 tablet by mouth daily.      . Omega-3 Fatty Acids (FISH OIL) 1200 MG CAPS Take 1,200 mg by mouth 2 (two) times daily.     . ranitidine (ZANTAC) 150 MG tablet Take 1 tablet (150 mg total) by mouth 2 (two) times daily. 180 tablet 1  . sertraline (ZOLOFT) 100 MG tablet Take 1 tablet (100 mg total) by mouth every evening. 90 tablet 1  . tamsulosin (FLOMAX) 0.4 MG CAPS capsule TAKE 1 CAPSULE (0.4 MG TOTAL) DAILY AFTER BREAKFAST. 90 capsule 1   No current facility-administered medications for this visit.     Allergies (verified) Ace inhibitors   PAST HISTORY  Family History Family History  Problem Relation Age of Onset  . Coronary artery disease    . Hyperlipidemia    . Hypertension    . Pulmonary embolism Mother     died of PE  . Prostate cancer    . Anuerysm Father     history of popliteal  . Other Neg Hx     polycystic kidney disease, and colon cancer  . Colon cancer Neg Hx     Social History History  Substance Use Topics  . Smoking status: Former Smoker    Quit date: 08/24/1980  . Smokeless tobacco: Never Used     Comment: quit   . Alcohol Use: 1.5 oz/week    3 drink(s) per week     Comment: Occasional    Are there smokers in your home (other than you)?  No  Risk Factors Current  exercise habits: walking  Dietary issues discussed: reports diet is fair.  Needs to cook more at home    Cardiac risk factors: advanced age (older than 50 for men, 71 for women), dyslipidemia, hypertension and male gender.  Depression Screen (Note: if answer to either of the following is "Yes", a more complete depression screening is indicated)   Q1: Over the past two weeks, have you felt down, depressed or hopeless? No  Q2: Over the past  two weeks, have you felt little interest or pleasure in doing things? No  Have you lost interest or pleasure in daily life? No  Do you often feel hopeless? No  Do you cry easily over simple problems? No  Activities of Daily Living In your present state of health, do you have any difficulty performing the following activities?:  Driving? No Managing money?  No Feeding yourself? No Getting from bed to chair? No Climbing a flight of stairs? No Preparing food and eating?: No Bathing or showering? No Getting dressed: No Getting to the toilet? No Using the toilet:No Moving around from place to place: No In the past year have you fallen or had a near fall?:has had near falls due to rising too fast.    Are you sexually active?  No  Do you have more than one partner?  No  Hearing Difficulties: No Do you often ask people to speak up or repeat themselves? No Do you experience ringing or noises in your ears? No Do you have difficulty understanding soft or whispered voices? No   Do you feel that you have a problem with memory? no  Do you often misplace items? Yes- forgets his bag sometimes  Do you feel safe at home?  Yes  Cognitive Testing  Alert? Yes  Normal Appearance?Yes  Oriented to person? Yes  Place? Yes   Time? Yes  Recall of three objects?  Yes  Can perform simple calculations? Yes  Displays appropriate judgment?Yes  Can read the correct time from a watch face?Yes   Advanced Directives have been discussed with the patient? Yes   List  the Names of Other Physician/Practitioners you currently use: 1.  Kirk Ruths, MD- Cardiology, Elmarie Shiley, MD- Nephrology, Alexis Frock, MD- Urology, Harland Dingwall, MD- Vascular Surgery   Indicate any recent Medical Services you may have received from other than [facility] providers in the past year (date may be approximate).  Immunization History  Administered Date(s) Administered  . Influenza Split 05/11/2011, 06/26/2012  . Influenza Whole 02/25/2010  . Influenza,inj,Quad PF,36+ Mos 01/31/2013, 04/07/2014  . Pneumococcal Conjugate-13 07/16/2013  . Pneumococcal Polysaccharide-23 02/25/2010  . Td 06/22/2008    Screening Tests Health Maintenance  Topic Date Due  . ZOSTAVAX  05/07/2003  . COLONOSCOPY  09/21/2012  . INFLUENZA VACCINE  12/07/2014  . TETANUS/TDAP  06/22/2018  . PNA vac Low Risk Adult  Completed    All answers were reviewed with the patient and necessary referrals were made:  O'SULLIVAN,Kmari Brian S., NP   09/09/2014   History reviewed: allergies, current medications, past family history, past medical history, past social history, past surgical history and problem list  Review of Systems   Review of Systems  Constitutional: Negative for unexpected weight change.  HENT: Negative for hearing loss.  Notes AM rhinorrhea.   Eyes: Negative for visual disturbance.  Respiratory: Negative for cough.   Cardiovascular: Negative for chest pain.  Noted mild leg swelling yesterday after high sodium meal Gastrointestinal: Negative for nausea, vomiting, diarrhea and blood in stool.  Genitourinary: see HPI Musculoskeletal: Negative for myalgias, notes some arthritis in the left hand/wrist Skin: Negative for rash.  Neurological: Negative for headaches.  Hematological: Negative for adenopathy.  Psychiatric/Behavioral: Denies depression or anxiety   Objective:     Vision by Snellen chart:see nursing Blood pressure 128/70, pulse 67, temperature 97.6 F (36.4 C), temperature source  Oral, resp. rate 16, height 5\' 11"  (1.803 m), weight 233 lb 6.4 oz (105.87 kg), SpO2 96 %. Body  mass index is 32.57 kg/(m^2).  Physical Exam  Constitutional: He is oriented to person, place, and time. He appears well-developed and well-nourished. No distress.  HENT:  Head: Normocephalic and atraumatic.  Right Ear: Tympanic membrane and ear canal normal.  Left Ear: Tympanic membrane and ear canal normal.  Mouth/Throat: Oropharynx is clear and moist.  Eyes: Pupils are equal, round, and reactive to light. No scleral icterus.  Neck: Normal range of motion. No thyromegaly present.  Cardiovascular: Normal rate and regular rhythm.   No murmur heard. Pulmonary/Chest: Effort normal and breath sounds normal. No respiratory distress. He has no wheezes. He has no rales. He exhibits no tenderness.  Abdominal: Soft. Bowel sounds are normal. He exhibits no distension and no mass. There is no tenderness. There is no rebound and no guarding.  Musculoskeletal: He exhibits no edema.  Lymphadenopathy:    He has no cervical adenopathy.  Neurological: He is alert and oriented to person, place, and time. He has normal patellar reflexes. He exhibits normal muscle tone. Coordination normal.  Skin: Skin is warm and dry.  Psychiatric: He has a normal mood and affect. His behavior is normal. Judgment and thought content normal.          Assessment & Plan:             Assessment & Plan:        Assessment:          Plan:     During the course of the visit the patient was educated and counseled about appropriate screening and preventive services including:    Colorectal cancer screening  Advanced directives: does not have HCPOA- given MOST form and HCPOA forms to review Declines shingles vaccine due to cost  Diet review for nutrition referral? Yes ____  Not Indicated _x___   Patient Instructions (the written plan) was given to the patient.  Medicare Attestation I have personally  reviewed: The patient's medical and social history Their use of alcohol, tobacco or illicit drugs Their current medications and supplements The patient's functional ability including ADLs,fall risks, home safety risks, cognitive, and hearing and visual impairment Diet and physical activities Evidence for depression or mood disorders  The patient's weight, height, BMI, and visual acuity have been recorded in the chart.  I have made referrals, counseling, and provided education to the patient based on review of the above and I have provided the patient with a written personalized care plan for preventive services.     Nance Pear., NP   09/09/2014      Patient ID: Ellis Savage, male   DOB: January 26, 1941, 74 y.o.   MRN: [id number]

## 2014-09-15 ENCOUNTER — Encounter: Payer: Self-pay | Admitting: Family

## 2014-09-21 ENCOUNTER — Telehealth: Payer: Self-pay | Admitting: Family

## 2014-09-21 MED ORDER — ATORVASTATIN CALCIUM 40 MG PO TABS: ORAL_TABLET | ORAL | Status: DC

## 2014-09-21 NOTE — Telephone Encounter (Signed)
Caller name: Phenix Relation to pt: self Call back number:[phone] Pharmacy: [facility]  Reason for call:   Requesting atorvastatin 40mg ,

## 2014-09-21 NOTE — Telephone Encounter (Signed)
Rx was printed at last office visit 09/09/14.  Called pt and he states he waited to late to mail Rxs to [facility] and he needs refill now. All other Rxs from 09/09/14 are being mailed. Refill sent. Pt notified.

## 2014-10-02 ENCOUNTER — Telehealth: Payer: Self-pay | Admitting: *Deleted

## 2014-10-02 MED ORDER — SERTRALINE HCL 100 MG PO TABS: 100.0000 mg | ORAL_TABLET | Freq: Every evening | ORAL | Status: DC

## 2014-10-02 MED ORDER — AMLODIPINE BESYLATE 10 MG PO TABS: 10.0000 mg | ORAL_TABLET | Freq: Every evening | ORAL | Status: DC

## 2014-10-02 MED ORDER — RANITIDINE HCL 150 MG PO TABS: 150.0000 mg | ORAL_TABLET | Freq: Two times a day (BID) | ORAL | Status: DC

## 2014-10-02 MED ORDER — TAMSULOSIN HCL 0.4 MG PO CAPS: ORAL_CAPSULE | ORAL | Status: DC

## 2014-10-02 MED ORDER — LOSARTAN POTASSIUM 50 MG PO TABS: 50.0000 mg | ORAL_TABLET | Freq: Every evening | ORAL | Status: DC

## 2014-10-02 MED ORDER — ISOSORBIDE DINITRATE 20 MG PO TABS: 20.0000 mg | ORAL_TABLET | Freq: Three times a day (TID) | ORAL | Status: DC

## 2014-10-02 MED ORDER — METOPROLOL TARTRATE 100 MG PO TABS: 100.0000 mg | ORAL_TABLET | Freq: Two times a day (BID) | ORAL | Status: DC

## 2014-10-02 MED ORDER — FUROSEMIDE 40 MG PO TABS: 40.0000 mg | ORAL_TABLET | Freq: Every day | ORAL | Status: DC

## 2014-10-02 NOTE — Telephone Encounter (Signed)
Spoke with pt. He is unable to submit the handwritten Rxs we gave him on 09/09/14. He has returned them and is requesting that we send them to [facility] mail order. He reports that he has enough supply on hand until he receives mail order shipments. Refills sent.

## 2014-10-12 ENCOUNTER — Telehealth: Payer: Self-pay | Admitting: Family

## 2014-10-12 MED ORDER — ISOSORBIDE DINITRATE 20 MG PO TABS: 20.0000 mg | ORAL_TABLET | Freq: Three times a day (TID) | ORAL | Status: DC

## 2014-10-12 NOTE — Telephone Encounter (Signed)
Received call from pt. He states [employer] did not receive Rx for isosorbide and he ran out over the weekend. Requests 12 tablets to [facility] on [address] and re-send 90 day supply to mail order. Apologies to pt. Rx's sent.

## 2014-11-02 ENCOUNTER — Telehealth: Payer: Self-pay

## 2014-11-02 NOTE — Telephone Encounter (Signed)
PATIENT CALLED FOR SAMPLES OF ELIQUIS 5 MG PLACED SAMPLES UP FRONT

## 2014-11-03 ENCOUNTER — Telehealth: Payer: Self-pay

## 2014-11-04 ENCOUNTER — Telehealth: Payer: Self-pay

## 2014-11-04 NOTE — Telephone Encounter (Signed)
Mark [name]  I talked to Mr Cuff and explained to him about the tier exceptation  And he agree to talk to you so we can get this letter done so we can send in the paper work

## 2014-11-05 ENCOUNTER — Ambulatory Visit (HOSPITAL_COMMUNITY)
Admission: RE | Admit: 2014-11-05 | Discharge: 2014-11-05 | Disposition: A | Discharge status: Home / Self Care | Payer: Medicare PPO | Source: Ambulatory Visit | Attending: Urology | Admitting: Urology

## 2014-11-05 ENCOUNTER — Other Ambulatory Visit: Payer: Self-pay | Admitting: Urology

## 2014-11-05 ENCOUNTER — Encounter: Payer: Self-pay | Admitting: *Deleted

## 2014-11-05 DIAGNOSIS — J449 Chronic obstructive pulmonary disease, unspecified: Secondary | ICD-10-CM | POA: Insufficient documentation

## 2014-11-05 DIAGNOSIS — Z85528 Personal history of other malignant neoplasm of kidney: Secondary | ICD-10-CM | POA: Insufficient documentation

## 2014-11-05 DIAGNOSIS — Z905 Acquired absence of kidney: Secondary | ICD-10-CM | POA: Diagnosis not present

## 2014-11-05 DIAGNOSIS — C649 Malignant neoplasm of unspecified kidney, except renal pelvis: Secondary | ICD-10-CM

## 2014-11-05 DIAGNOSIS — Z87891 Personal history of nicotine dependence: Secondary | ICD-10-CM | POA: Insufficient documentation

## 2014-11-05 DIAGNOSIS — I2510 Atherosclerotic heart disease of native coronary artery without angina pectoris: Secondary | ICD-10-CM | POA: Diagnosis not present

## 2014-11-11 ENCOUNTER — Telehealth: Payer: Self-pay

## 2014-11-11 NOTE — Telephone Encounter (Signed)
Fax from [facility] approving his Eliquis 5mg  tabs through 05/08/2015.

## 2014-11-11 NOTE — Telephone Encounter (Signed)
Thanks

## 2014-11-12 ENCOUNTER — Telehealth: Payer: Self-pay

## 2014-11-12 NOTE — Telephone Encounter (Signed)
CALLED PATIENT TO LET HIM KNOW THAT HIS TIER EXCEP WAS APPROVED

## 2014-12-02 ENCOUNTER — Telehealth: Payer: Self-pay | Admitting: Cardiology

## 2014-12-02 MED ORDER — APIXABAN 5 MG PO TABS
5.0000 mg | ORAL_TABLET | Freq: Two times a day (BID) | ORAL | Status: DC
Start: 2014-12-02 — End: 2015-05-12

## 2014-12-02 NOTE — Telephone Encounter (Signed)
Spoke with pt, refill with eliquis sent to the pharmacy. He is soon going to be in the donut hole and may have to change to warfarin  He will let me know.

## 2015-04-09 ENCOUNTER — Telehealth: Payer: Self-pay | Admitting: Family

## 2015-04-09 NOTE — Telephone Encounter (Signed)
Relation to [id number] Call back number: [phone] Pharmacy: [facility]   Reason for call:    Patient requesting a refill of the following medication: sertraline (ZOLOFT) 100 MG tablet  isosorbide dinitrate (ISORDIL) 20 MG tablet  metoprolol (LOPRESSOR) 100 MG tablet  ranitidine (ZANTAC) 150 MG tablet  tamsulosin (FLOMAX) 0.4 MG CAPS capsule  furosemide (LASIX) 40 MG tablet  atorvastatin (LIPITOR) 40 MG tablet  amLODipine (NORVASC) 10 MG tablet

## 2015-04-12 ENCOUNTER — Telehealth: Payer: Self-pay | Admitting: Cardiology

## 2015-04-12 MED ORDER — SERTRALINE HCL 100 MG PO TABS: 100.0000 mg | ORAL_TABLET | Freq: Every evening | ORAL | Status: DC

## 2015-04-12 MED ORDER — ISOSORBIDE DINITRATE 20 MG PO TABS: 20.0000 mg | ORAL_TABLET | Freq: Three times a day (TID) | ORAL | Status: DC

## 2015-04-12 MED ORDER — ATORVASTATIN CALCIUM 40 MG PO TABS: ORAL_TABLET | ORAL | Status: DC

## 2015-04-12 MED ORDER — AMLODIPINE BESYLATE 10 MG PO TABS: 10.0000 mg | ORAL_TABLET | Freq: Every evening | ORAL | Status: DC

## 2015-04-12 MED ORDER — TAMSULOSIN HCL 0.4 MG PO CAPS: ORAL_CAPSULE | ORAL | Status: DC

## 2015-04-12 MED ORDER — METOPROLOL TARTRATE 100 MG PO TABS: 100.0000 mg | ORAL_TABLET | Freq: Two times a day (BID) | ORAL | Status: DC

## 2015-04-12 MED ORDER — FUROSEMIDE 40 MG PO TABS: 40.0000 mg | ORAL_TABLET | Freq: Every day | ORAL | Status: DC

## 2015-04-12 MED ORDER — RANITIDINE HCL 150 MG PO TABS: 150.0000 mg | ORAL_TABLET | Freq: Two times a day (BID) | ORAL | Status: DC

## 2015-04-12 NOTE — Telephone Encounter (Signed)
Please call,concerning his medicine. °

## 2015-04-12 NOTE — Telephone Encounter (Signed)
Refills sent on below meds, it looks like pt will also be due for refill of losartan. Pt states he is unsure how but he still has a 90 day supply of losartan and does not want refill sent at this time. Scheduled pt f/u with PCP for 06/01/15 at 9:30am.

## 2015-04-12 NOTE — Telephone Encounter (Signed)
Spoke with pt, he is going to need to switch to warfarin because of financial reasons. Follow up scheduled with pharm md to help with the change

## 2015-04-13 ENCOUNTER — Telehealth: Payer: Self-pay

## 2015-04-13 DIAGNOSIS — Z48812 Encounter for surgical aftercare following surgery on the circulatory system: Secondary | ICD-10-CM

## 2015-04-13 DIAGNOSIS — Z95828 Presence of other vascular implants and grafts: Secondary | ICD-10-CM

## 2015-04-13 NOTE — Telephone Encounter (Signed)
In reviewing pt's chart, it is noted that he does not have a follow-up appt. for surveillance of his EVAR 2011.  Last note in EMR was Transcatheter angiography in IR from Dr. Kathlene Cote 10/13/2013; results showed that there was no endoleak at that time.  Discussed with Dr. Trula Slade re: recommended testing for surveillance.  Advised to schedule pt. for AAA [place] and OV.  Will contact pt. For appts.

## 2015-04-14 NOTE — Telephone Encounter (Signed)
Contacted pt. to determine the date the CTA is scheduled.  Stated that Dr. Zettie Pho office has yet to schedule it, but should be scheduled before end of year, prior to his f/u with Dr. Tresa Moore in early January.  Will have Dr. Trula Slade review when completed, and advise next step.

## 2015-04-14 NOTE — Telephone Encounter (Signed)
Patient is having a CTA end of December with Dr Tresa Moore- he will have them send a report to Dr Trula Slade- he would like for [place] to notify him if the requested appointment for AAA ultrasound would still be needed after [facility] reviews the report.

## 2015-05-04 ENCOUNTER — Encounter: Payer: Medicare PPO | Admitting: Pharmacist Clinician (PhC)/ Clinical Pharmacy Specialist

## 2015-05-11 ENCOUNTER — Telehealth: Payer: Self-pay | Admitting: Family

## 2015-05-11 NOTE — Telephone Encounter (Signed)
Error

## 2015-05-12 ENCOUNTER — Ambulatory Visit (INDEPENDENT_AMBULATORY_CARE_PROVIDER_SITE_OTHER): Payer: Commercial Managed Care - HMO | Admitting: Family

## 2015-05-12 ENCOUNTER — Encounter: Payer: Self-pay | Admitting: Family

## 2015-05-12 VITALS — BP 116/84 | HR 65 | Temp 98.2°F | Resp 14 | Ht 71.0 in | Wt 236.0 lb

## 2015-05-12 DIAGNOSIS — Z23 Encounter for immunization: Secondary | ICD-10-CM | POA: Diagnosis not present

## 2015-05-12 DIAGNOSIS — I480 Paroxysmal atrial fibrillation: Secondary | ICD-10-CM | POA: Diagnosis not present

## 2015-05-12 MED ORDER — WARFARIN SODIUM 5 MG PO TABS: 5.0000 mg | ORAL_TABLET | Freq: Every day | ORAL | Status: DC

## 2015-05-12 NOTE — Progress Notes (Signed)
Subjective:    Patient ID: Mark [name], male    DOB: 04-Sep-1940, 75 y.o.   MRN: [id number]  HPI  Mark [name] is a 75 yr old male who presents today to discuss his anticoagulation. The patient is maintained on Eliquis for hx of AF.  He is unable to afford Eliquis and is out of medication.  He had been getting samples from Dr. Jacalyn Lefevre office but tells me it is too hard for him to get to [place] due to transportation issues (he rides the bus).  He reports that the Eliquis used up his part D funds.  He wishes to begin coumadin in place of Eliquis and wants to have his PT/INR monitored at our office for transportation sake.   Review of Systems    see HPI  Past Medical History  Diagnosis Date  . Hypertension   . Hyperlipidemia   . CAD (coronary artery disease)   . History of nephrolithiasis   . GERD (gastroesophageal reflux disease)   . Depression   . History of skin cancer   . History of pneumothorax     LONG TIME AGO  . Atrial fibrillation ([place])   . Dysrhythmia     AF  . Renal cell carcinoma ([place])   . Chronic kidney disease     CHRONIC RENAL INSUFFICIENCY  . Arthritis     LEFT HAND, RT KNEE  . Cancer (HCC)     BASAL CELL CA OF NOSE  . Shortness of breath   . Myocardial infarction ([place]) 2006  . Shingles outbreak     PT NOTICED SKIN RASH RT GROIN AND RIGHT TRUNK ON MONDAY 10/27/13 - NOT A LOT OF DISCOMFORT- NOT ON ANY ORAL MEDS TO TREAT.  Marland Kitchen AAA (abdominal aortic aneurysm) (HCC)     9.1 cm repair 05/2009 ( ENDOVASCULAR REPAIR ).  MORE RECENT RE-EVALUATION OF ENLARGING AAA BY DR. Trula Slade AND BY DR. Kathlene Cote  MAY 2015 - PT STATES HE UNDERSTANDS THAT NOTHING IS LEAKING AT PRESENT TIME AND HE IS TO FOLLOW UP WITH HIS DOCTORS FOR FUTURE MONITORING I    Social History   Social History  . Marital Status: Divorced    Spouse Name: N/A  . Number of Children: 2  . Years of Education: N/A   Occupational History  .     Social History Main Topics  . Smoking status: Former  Smoker    Quit date: 08/24/1980  . Smokeless tobacco: Never Used     Comment: quit   . Alcohol Use: 1.5 oz/week    3 drink(s) per week     Comment: Occasional  . Drug Use: No  . Sexual Activity: Not on file   Other Topics Concern  . Not on file   Social History Narrative   Retired   Divorced   2 sons 68 &30     Occupation:  Part time sports broadcaster    Smoking Status:  quit (08/24/1980)   Packs/Day:  1.0    Caffeine use/day:  3 beverages daily    Does Patient Exercise:  no   Alcohol Use - yes    Past Surgical History  Procedure Laterality Date  . Cardiac catheterization  06/2004  . Abdominal aortic aneurysm repair  JAN 2011    ENDOVASCULAR - DR. BRABHAM  . Tonsillectomy    . Athroscopic knee surgery      RIGHT KNEE  . Lithotripsy    . Robot assisted laparoscopic nephrectomy Left 11/06/2013  Procedure: ROBOTIC ASSISTED LAPAROSCOPIC RADICAL  NEPHRECTOMY;  Surgeon: Alexis Frock, MD;  Location: [facility] ORS;  Service: Urology;  Laterality: Left;  . Abdominal aortagram N/A 09/24/2013    Procedure: ABDOMINAL Maxcine Ham;  Surgeon: Serafina Mitchell, MD;  Location: [facility] CATH LAB;  Service: Cardiovascular;  Laterality: N/A;  . Nephrectomy      Family History  Problem Relation Age of Onset  . Coronary artery disease    . Hyperlipidemia    . Hypertension    . Pulmonary embolism Mother     died of PE  . Prostate cancer    . Anuerysm Father     history of popliteal  . Other Neg Hx     polycystic kidney disease, and colon cancer  . Colon cancer Neg Hx     Allergies  Allergen Reactions  . Ace Inhibitors     REACTION: Cough    Current Outpatient Prescriptions on File Prior to Visit  Medication Sig Dispense Refill  . amLODipine (NORVASC) 10 MG tablet Take 1 tablet (10 mg total) by mouth every evening. 90 tablet 1  . apixaban (ELIQUIS) 5 MG TABS tablet Take 1 tablet (5 mg total) by mouth 2 (two) times daily. 180 tablet 3  . atorvastatin (LIPITOR) 40 MG tablet TAKE 1 TABLET ONE  TIME DAILY 90 tablet 1  . Coenzyme Q10 200 MG capsule Take 200 mg by mouth daily.    . furosemide (LASIX) 40 MG tablet Take 1 tablet (40 mg total) by mouth daily with lunch. 90 tablet 1  . isosorbide dinitrate (ISORDIL) 20 MG tablet Take 1 tablet (20 mg total) by mouth 3 (three) times daily. 270 tablet 1  . losartan (COZAAR) 50 MG tablet Take 1 tablet (50 mg total) by mouth every evening. 90 tablet 1  . metoprolol (LOPRESSOR) 100 MG tablet Take 1 tablet (100 mg total) by mouth 2 (two) times daily. 180 tablet 1  . Multiple Vitamin (MULTIVITAMIN) tablet Take 1 tablet by mouth daily.      . Omega-3 Fatty Acids (FISH OIL) 1200 MG CAPS Take 1,200 mg by mouth 2 (two) times daily.     . ranitidine (ZANTAC) 150 MG tablet Take 1 tablet (150 mg total) by mouth 2 (two) times daily. 180 tablet 1  . sertraline (ZOLOFT) 100 MG tablet Take 1 tablet (100 mg total) by mouth every evening. 90 tablet 1  . tamsulosin (FLOMAX) 0.4 MG CAPS capsule TAKE 1 CAPSULE (0.4 MG TOTAL) DAILY AFTER BREAKFAST. 90 capsule 1   No current facility-administered medications on file prior to visit.    BP 116/84 mmHg  Pulse 65  Temp(Src) 98.2 F (36.8 C) (Oral)  Resp 14  Ht 5\' 11"  (1.803 m)  Wt 236 lb (107.049 kg)  BMI 32.93 kg/m2  SpO2 95%    Objective:   Physical Exam  Constitutional: He is oriented to person, place, and time. He appears well-developed and well-nourished. No distress.  Neurological: He is alert and oriented to person, place, and time.  Skin: Skin is warm and dry.  Psychiatric: He has a normal mood and affect. His behavior is normal. Judgment and thought content normal.          Assessment & Plan:

## 2015-05-12 NOTE — Assessment & Plan Note (Addendum)
Pt unable to afford Eliquis. Will change to coumadin. Begin coumadin 5mg  once daily and plan follow up PT/INR in our office in 1 week.  Advised pt arrange follow up with cardiology. 15 minutes spent with the patient today.  >50% of this time was spent counseling patient on anticoagulation.

## 2015-05-12 NOTE — Patient Instructions (Addendum)
Please arrange follow up with Dr. Stanford Breed. Remain off of Eliquis, start coumadin 5 mg once daily. Follow up in 1 week for nurse visit coumadin check (PT/INR). Keep scheduled office visit.

## 2015-05-19 ENCOUNTER — Ambulatory Visit (INDEPENDENT_AMBULATORY_CARE_PROVIDER_SITE_OTHER): Payer: Commercial Managed Care - HMO | Admitting: Behavioral Health

## 2015-05-19 DIAGNOSIS — I480 Paroxysmal atrial fibrillation: Secondary | ICD-10-CM

## 2015-05-19 LAB — POCT INR: INR: 1.7

## 2015-05-19 NOTE — Progress Notes (Signed)
Pre visit review using our clinic review tool, if applicable. No additional management support is needed unless otherwise documented below in the visit note. 

## 2015-05-19 NOTE — Patient Instructions (Signed)
Per Melissa: Take 7.5 mg today and then continue taking 5 mg daily. Return in 1 week for INR check.

## 2015-05-19 NOTE — Progress Notes (Signed)
reviewed

## 2015-05-26 ENCOUNTER — Ambulatory Visit (INDEPENDENT_AMBULATORY_CARE_PROVIDER_SITE_OTHER): Payer: Commercial Managed Care - HMO | Admitting: Behavioral Health

## 2015-05-26 DIAGNOSIS — I480 Paroxysmal atrial fibrillation: Secondary | ICD-10-CM | POA: Diagnosis not present

## 2015-05-26 DIAGNOSIS — R6889 Other general symptoms and signs: Secondary | ICD-10-CM | POA: Diagnosis not present

## 2015-05-26 LAB — POCT INR: INR: 2.7

## 2015-05-26 NOTE — Progress Notes (Signed)
Reviewed

## 2015-05-26 NOTE — Progress Notes (Signed)
Pre visit review using our clinic review tool, if applicable. No additional management support is needed unless otherwise documented below in the visit note. 

## 2015-05-26 NOTE — Patient Instructions (Signed)
Per Melissa: Take 5 mg each day, except take 7.5 mg on Wednesdays. Return in 2 weeks for INR check.

## 2015-05-27 DIAGNOSIS — N183 Chronic kidney disease, stage 3 (moderate): Secondary | ICD-10-CM | POA: Diagnosis not present

## 2015-06-01 ENCOUNTER — Telehealth: Payer: Self-pay | Admitting: *Deleted

## 2015-06-01 ENCOUNTER — Encounter: Payer: Self-pay | Admitting: Family

## 2015-06-01 ENCOUNTER — Ambulatory Visit (INDEPENDENT_AMBULATORY_CARE_PROVIDER_SITE_OTHER): Payer: Commercial Managed Care - HMO | Admitting: Family

## 2015-06-01 VITALS — HR 73 | Temp 97.9°F | Resp 16 | Ht 71.0 in | Wt 235.6 lb

## 2015-06-01 DIAGNOSIS — I480 Paroxysmal atrial fibrillation: Secondary | ICD-10-CM

## 2015-06-01 DIAGNOSIS — I10 Essential (primary) hypertension: Secondary | ICD-10-CM | POA: Diagnosis not present

## 2015-06-01 DIAGNOSIS — F329 Major depressive disorder, single episode, unspecified: Secondary | ICD-10-CM

## 2015-06-01 DIAGNOSIS — F32A Depression, unspecified: Secondary | ICD-10-CM

## 2015-06-01 DIAGNOSIS — E785 Hyperlipidemia, unspecified: Secondary | ICD-10-CM

## 2015-06-01 LAB — PROTIME-INR
INR: 2.8 ratio — ABNORMAL HIGH (ref 0.8–1.0)
PROTHROMBIN TIME: 30.3 s — AB (ref 9.6–13.1)

## 2015-06-01 LAB — BASIC METABOLIC PANEL
BUN: 29 mg/dL — ABNORMAL HIGH (ref 6–23)
CHLORIDE: 109 meq/L (ref 96–112)
CO2: 24 mEq/L (ref 19–32)
Calcium: 8.8 mg/dL (ref 8.4–10.5)
Creatinine, Ser: 1.83 mg/dL — ABNORMAL HIGH (ref 0.40–1.50)
GFR: 38.86 mL/min — AB (ref >60.00)
Glucose, Bld: 112 mg/dL — ABNORMAL HIGH (ref 70–99)
POTASSIUM: 3.8 meq/L (ref 3.5–5.1)
SODIUM: 141 meq/L (ref 135–145)

## 2015-06-01 LAB — LIPID PANEL
CHOLESTEROL: 126 mg/dL (ref 0–200)
HDL: 37.4 mg/dL — AB (ref >39.00)
LDL CALC: 69 mg/dL (ref 0–99)
NonHDL: 88.58
TRIGLYCERIDES: 98 mg/dL (ref 0.0–149.0)
Total CHOL/HDL Ratio: 3
VLDL: 19.6 mg/dL (ref 0.0–40.0)

## 2015-06-01 NOTE — Patient Instructions (Addendum)
Please complete lab work prior to leaving.  Schedule a medicare wellness visit at the front desk.

## 2015-06-01 NOTE — Assessment & Plan Note (Addendum)
maintained on coumadin.  Obtain PT/INR today.

## 2015-06-01 NOTE — Assessment & Plan Note (Signed)
Stable on lipitor. Obtain FLP.

## 2015-06-01 NOTE — Progress Notes (Signed)
Pre visit review using our clinic review tool, if applicable. No additional management support is needed unless otherwise documented below in the visit note. 

## 2015-06-01 NOTE — Telephone Encounter (Signed)
Received fax from [facility] requesting order for wrist / hand brace. Pt states he only called them to inquire about cost and does not want to pursue the brace. Faxed denial to [phone].

## 2015-06-01 NOTE — Assessment & Plan Note (Signed)
BP stable on current meds. Continue same.  

## 2015-06-01 NOTE — Assessment & Plan Note (Signed)
Maintained on zoloft, stable.  Continue same.

## 2015-06-01 NOTE — Progress Notes (Signed)
Subjective:    Patient ID: Mark [name], male    DOB: 25-May-1940, 75 y.o.   MRN: [id number]  HPI   1) HTN- ptis maintained on amlodipine, metoprolol and isosorbide.  BP Readings from Last 3 Encounters:  05/12/15 116/84  09/09/14 128/70  07/17/14 130/94   2) Hyperlipidemia- continues lipitor, denies myalgia.  Lab Results  Component Value Date   CHOL 126 06/16/2014   HDL 40.00 06/16/2014   LDLCALC 67 06/16/2014   TRIG 95.0 06/16/2014   CHOLHDL 3 06/16/2014   3) AF- pt was transitioned from eliquis to coumadin last visit.   Lab Results  Component Value Date   INR 2.7 05/26/2015   INR 1.7 05/19/2015   INR 1.28 10/30/2013   3) Depression- maintained on zoloft, reports that mood is improved on zoloft.       Review of Systems Denies CP.  Denies SOB except on occasion with exertion.   Past Medical History  Diagnosis Date  . Hypertension   . Hyperlipidemia   . CAD (coronary artery disease)   . History of nephrolithiasis   . GERD (gastroesophageal reflux disease)   . Depression   . History of skin cancer   . History of pneumothorax     LONG TIME AGO  . Atrial fibrillation ([place])   . Dysrhythmia     AF  . Renal cell carcinoma ([place])   . Chronic kidney disease     CHRONIC RENAL INSUFFICIENCY  . Arthritis     LEFT HAND, RT KNEE  . Cancer (HCC)     BASAL CELL CA OF NOSE  . Shortness of breath   . Myocardial infarction ([place]) 2006  . Shingles outbreak     PT NOTICED SKIN RASH RT GROIN AND RIGHT TRUNK ON MONDAY 10/27/13 - NOT A LOT OF DISCOMFORT- NOT ON ANY ORAL MEDS TO TREAT.  Marland Kitchen AAA (abdominal aortic aneurysm) (HCC)     9.1 cm repair 05/2009 ( ENDOVASCULAR REPAIR ).  MORE RECENT RE-EVALUATION OF ENLARGING AAA BY DR. Trula Slade AND BY DR. Kathlene Cote  MAY 2015 - PT STATES HE UNDERSTANDS THAT NOTHING IS LEAKING AT PRESENT TIME AND HE IS TO FOLLOW UP WITH HIS DOCTORS FOR FUTURE MONITORING I    Social History   Social History  . Marital Status: Divorced    Spouse Name:  N/A  . Number of Children: 2  . Years of Education: N/A   Occupational History  .     Social History Main Topics  . Smoking status: Former Smoker    Quit date: 08/24/1980  . Smokeless tobacco: Never Used     Comment: quit   . Alcohol Use: 1.5 oz/week    3 drink(s) per week     Comment: Occasional  . Drug Use: No  . Sexual Activity: Not on file   Other Topics Concern  . Not on file   Social History Narrative   Retired   Divorced   2 sons 7 &30     Occupation:  Part time sports broadcaster    Smoking Status:  quit (08/24/1980)   Packs/Day:  1.0    Caffeine use/day:  3 beverages daily    Does Patient Exercise:  no   Alcohol Use - yes    Past Surgical History  Procedure Laterality Date  . Cardiac catheterization  06/2004  . Abdominal aortic aneurysm repair  JAN 2011    ENDOVASCULAR - DR. BRABHAM  . Tonsillectomy    . Athroscopic knee surgery  RIGHT KNEE  . Lithotripsy    . Robot assisted laparoscopic nephrectomy Left 11/06/2013    Procedure: ROBOTIC ASSISTED LAPAROSCOPIC RADICAL  NEPHRECTOMY;  Surgeon: Alexis Frock, MD;  Location: [facility];  Service: Urology;  Laterality: Left;  . Abdominal aortagram N/A 09/24/2013    Procedure: ABDOMINAL Maxcine Ham;  Surgeon: Serafina Mitchell, MD;  Location: [facility] CATH LAB;  Service: Cardiovascular;  Laterality: N/A;  . Nephrectomy      Family History  Problem Relation Age of Onset  . Coronary artery disease    . Hyperlipidemia    . Hypertension    . Pulmonary embolism Mother     died of PE  . Prostate cancer    . Anuerysm Father     history of popliteal  . Other Neg Hx     polycystic kidney disease, and colon cancer  . Colon cancer Neg Hx     Allergies  Allergen Reactions  . Ace Inhibitors     REACTION: Cough    Current Outpatient Prescriptions on File Prior to Visit  Medication Sig Dispense Refill  . amLODipine (NORVASC) 10 MG tablet Take 1 tablet (10 mg total) by mouth every evening. 90 tablet 1  . atorvastatin  (LIPITOR) 40 MG tablet TAKE 1 TABLET ONE TIME DAILY 90 tablet 1  . calcium elemental as carbonate (BARIATRIC TUMS ULTRA) 400 MG chewable tablet     . Coenzyme Q10 200 MG capsule Take 200 mg by mouth daily.    . furosemide (LASIX) 40 MG tablet Take 1 tablet (40 mg total) by mouth daily with lunch. 90 tablet 1  . isosorbide dinitrate (ISORDIL) 20 MG tablet Take 1 tablet (20 mg total) by mouth 3 (three) times daily. 270 tablet 1  . losartan (COZAAR) 50 MG tablet Take 1 tablet (50 mg total) by mouth every evening. 90 tablet 1  . metoprolol (LOPRESSOR) 100 MG tablet Take 1 tablet (100 mg total) by mouth 2 (two) times daily. 180 tablet 1  . Omega-3 Fatty Acids (FISH OIL) 1200 MG CAPS Take 1,200 mg by mouth 2 (two) times daily.     . ranitidine (ZANTAC) 150 MG tablet Take 1 tablet (150 mg total) by mouth 2 (two) times daily. 180 tablet 1  . sertraline (ZOLOFT) 100 MG tablet Take 1 tablet (100 mg total) by mouth every evening. 90 tablet 1  . tamsulosin (FLOMAX) 0.4 MG CAPS capsule TAKE 1 CAPSULE (0.4 MG TOTAL) DAILY AFTER BREAKFAST. 90 capsule 1  . warfarin (COUMADIN) 5 MG tablet Take 1 tablet (5 mg total) by mouth daily. 30 tablet 3   No current facility-administered medications on file prior to visit.    Pulse 73  Temp(Src) 97.9 F (36.6 C) (Oral)  Resp 16  Ht 5\' 11"  (1.803 m)  Wt 235 lb 9.6 oz (106.867 kg)  BMI 32.87 kg/m2  SpO2 95%        Objective:   Physical Exam  Constitutional: He is oriented to person, place, and time. He appears well-developed and well-nourished. No distress.  HENT:  Head: Normocephalic and atraumatic.  Cardiovascular: Normal rate and regular rhythm.   No murmur heard. Pulmonary/Chest: Effort normal and breath sounds normal. No respiratory distress. He has no wheezes. He has no rales.  Musculoskeletal:  Trace bilateral LE edema  Neurological: He is alert and oriented to person, place, and time.  Skin: Skin is warm and dry.  Psychiatric: He has a normal mood  and affect. His behavior is normal. Thought content normal.  Assessment & Plan:

## 2015-06-04 ENCOUNTER — Ambulatory Visit (HOSPITAL_COMMUNITY)
Admission: RE | Admit: 2015-06-04 | Discharge: 2015-06-04 | Disposition: A | Discharge status: Home / Self Care | Payer: Commercial Managed Care - HMO | Source: Ambulatory Visit | Attending: Urology | Admitting: Urology

## 2015-06-04 ENCOUNTER — Other Ambulatory Visit: Payer: Self-pay | Admitting: Urology

## 2015-06-04 ENCOUNTER — Telehealth: Payer: Self-pay | Admitting: *Deleted

## 2015-06-04 DIAGNOSIS — N183 Chronic kidney disease, stage 3 unspecified: Secondary | ICD-10-CM

## 2015-06-04 DIAGNOSIS — I714 Abdominal aortic aneurysm, without rupture: Secondary | ICD-10-CM | POA: Diagnosis not present

## 2015-06-04 DIAGNOSIS — Z905 Acquired absence of kidney: Secondary | ICD-10-CM | POA: Insufficient documentation

## 2015-06-04 DIAGNOSIS — C642 Malignant neoplasm of left kidney, except renal pelvis: Secondary | ICD-10-CM | POA: Diagnosis not present

## 2015-06-04 DIAGNOSIS — N2889 Other specified disorders of kidney and ureter: Secondary | ICD-10-CM | POA: Insufficient documentation

## 2015-06-04 NOTE — Telephone Encounter (Signed)
Yes please

## 2015-06-04 NOTE — Telephone Encounter (Signed)
Attempted to reach pt and left message to check mychart message re: below instructions.

## 2015-06-04 NOTE — Telephone Encounter (Signed)
-----   Message from [name], NP sent at 06/01/2015  3:45 PM EST ----- Kidney function is stable.  INR is therapeutic. Please continue coumadin 5 mg once daily and plan to repeat PT/INR in 1 month.

## 2015-06-04 NOTE — Telephone Encounter (Signed)
Pt currently scheduled for repeat INR on 06/09/15. Do you want that cancelled and bring pt back at end of February?

## 2015-06-07 ENCOUNTER — Encounter: Payer: Self-pay | Admitting: Surgery

## 2015-06-09 ENCOUNTER — Ambulatory Visit: Payer: Medicare HMO

## 2015-06-10 DIAGNOSIS — N189 Chronic kidney disease, unspecified: Secondary | ICD-10-CM | POA: Diagnosis not present

## 2015-06-10 DIAGNOSIS — N183 Chronic kidney disease, stage 3 (moderate): Secondary | ICD-10-CM | POA: Diagnosis not present

## 2015-06-10 DIAGNOSIS — N2581 Secondary hyperparathyroidism of renal origin: Secondary | ICD-10-CM | POA: Diagnosis not present

## 2015-06-11 ENCOUNTER — Other Ambulatory Visit: Payer: Self-pay | Admitting: *Deleted

## 2015-06-11 DIAGNOSIS — I714 Abdominal aortic aneurysm, without rupture, unspecified: Secondary | ICD-10-CM

## 2015-06-11 DIAGNOSIS — Z95828 Presence of other vascular implants and grafts: Secondary | ICD-10-CM

## 2015-06-14 ENCOUNTER — Encounter: Payer: Self-pay | Admitting: Surgery

## 2015-06-14 ENCOUNTER — Ambulatory Visit (HOSPITAL_COMMUNITY)
Admission: RE | Admit: 2015-06-14 | Discharge: 2015-06-14 | Disposition: A | Discharge status: Home / Self Care | Payer: Commercial Managed Care - HMO | Source: Ambulatory Visit | Attending: Surgery | Admitting: Surgery

## 2015-06-14 ENCOUNTER — Ambulatory Visit (INDEPENDENT_AMBULATORY_CARE_PROVIDER_SITE_OTHER): Payer: Commercial Managed Care - HMO | Admitting: Surgery

## 2015-06-14 VITALS — BP 137/100 | HR 74 | Ht 71.0 in | Wt 236.0 lb

## 2015-06-14 DIAGNOSIS — I714 Abdominal aortic aneurysm, without rupture, unspecified: Secondary | ICD-10-CM

## 2015-06-14 DIAGNOSIS — I129 Hypertensive chronic kidney disease with stage 1 through stage 4 chronic kidney disease, or unspecified chronic kidney disease: Secondary | ICD-10-CM | POA: Diagnosis not present

## 2015-06-14 DIAGNOSIS — D631 Anemia in chronic kidney disease: Secondary | ICD-10-CM | POA: Diagnosis not present

## 2015-06-14 DIAGNOSIS — N183 Chronic kidney disease, stage 3 (moderate): Secondary | ICD-10-CM | POA: Diagnosis not present

## 2015-06-14 DIAGNOSIS — E785 Hyperlipidemia, unspecified: Secondary | ICD-10-CM | POA: Diagnosis not present

## 2015-06-14 DIAGNOSIS — N2581 Secondary hyperparathyroidism of renal origin: Secondary | ICD-10-CM | POA: Diagnosis not present

## 2015-06-14 DIAGNOSIS — Z95828 Presence of other vascular implants and grafts: Secondary | ICD-10-CM | POA: Diagnosis not present

## 2015-06-14 DIAGNOSIS — N189 Chronic kidney disease, unspecified: Secondary | ICD-10-CM | POA: Diagnosis not present

## 2015-06-14 DIAGNOSIS — Z48812 Encounter for surgical aftercare following surgery on the circulatory system: Secondary | ICD-10-CM

## 2015-06-14 NOTE — Addendum Note (Signed)
Addended by: Dorthula Rue L on: 06/14/2015 02:33 PM   Modules accepted: Orders

## 2015-06-14 NOTE — Progress Notes (Signed)
Patient name: Mark [name] MRN: [id number] DOB: [birth year] Sex: male     Chief Complaint  Patient presents with  . Re-evaluation    hx of EVAR     HISTORY OF PRESENT ILLNESS: The patient is back for follow-up.  He is status post endovascular repair of a 9.5 cm abdominal aortic aneurysm on 06/03/2009.  A Gore device was used.  His postoperative course was complicated by bilateral seromas.  One was drained by Dr. Scot Dock, but it recurred.  He did have some bilateral leg swelling postoperatively.  Ultrasound was negative for DVT.  He had been followed for a endoleak.  In June 2015 he underwent angiography.  No endoleak was identified.  No intervention was performed.  The patient is also undergone a nephrectomy for renal cell cancer.  He has underlying renal issues with a creatinine of 1.8.  He has no abdominal pain.  The patient complains of shortness of breath, particularly when carrying groceries up 3 flights of stairs.  He is concerned about his weight gain as he is at his maximum weight of 230  Past Medical History  Diagnosis Date  . Hypertension   . Hyperlipidemia   . CAD (coronary artery disease)   . History of nephrolithiasis   . GERD (gastroesophageal reflux disease)   . Depression   . History of skin cancer   . History of pneumothorax     LONG TIME AGO  . Atrial fibrillation ([place])   . Dysrhythmia     AF  . Renal cell carcinoma ([place])   . Chronic kidney disease     CHRONIC RENAL INSUFFICIENCY  . Arthritis     LEFT HAND, RT KNEE  . Cancer (HCC)     BASAL CELL CA OF NOSE  . Shortness of breath   . Myocardial infarction ([place]) 2006  . Shingles outbreak     PT NOTICED SKIN RASH RT GROIN AND RIGHT TRUNK ON MONDAY 10/27/13 - NOT A LOT OF DISCOMFORT- NOT ON ANY ORAL MEDS TO TREAT.  Marland Kitchen AAA (abdominal aortic aneurysm) (HCC)     9.1 cm repair 05/2009 ( ENDOVASCULAR REPAIR ).  MORE RECENT RE-EVALUATION OF ENLARGING AAA BY DR. Trula Slade AND BY DR. Kathlene Cote  MAY 2015 - PT STATES HE  UNDERSTANDS THAT NOTHING IS LEAKING AT PRESENT TIME AND HE IS TO FOLLOW UP WITH HIS DOCTORS FOR FUTURE MONITORING I    Past Surgical History  Procedure Laterality Date  . Cardiac catheterization  06/2004  . Abdominal aortic aneurysm repair  JAN 2011    ENDOVASCULAR - DR. BRABHAM  . Tonsillectomy    . Athroscopic knee surgery      RIGHT KNEE  . Lithotripsy    . Robot assisted laparoscopic nephrectomy Left 11/06/2013    Procedure: ROBOTIC ASSISTED LAPAROSCOPIC RADICAL  NEPHRECTOMY;  Surgeon: Alexis Frock, MD;  Location: [facility];  Service: Urology;  Laterality: Left;  . Abdominal aortagram N/A 09/24/2013    Procedure: ABDOMINAL Maxcine Ham;  Surgeon: Serafina Mitchell, MD;  Location: [facility] CATH LAB;  Service: Cardiovascular;  Laterality: N/A;  . Nephrectomy      Social History   Social History  . Marital Status: Divorced    Spouse Name: N/A  . Number of Children: 2  . Years of Education: N/A   Occupational History  .     Social History Main Topics  . Smoking status: Former Smoker    Quit date: 08/24/1980  . Smokeless tobacco: Never Used  Comment: quit   . Alcohol Use: 1.5 oz/week    3 drink(s) per week     Comment: Occasional  . Drug Use: No  . Sexual Activity: Not on file   Other Topics Concern  . Not on file   Social History Narrative   Retired   Divorced   2 sons 23 &30     Occupation:  Part time sports Tax adviser    Smoking Status:  quit (08/24/1980)   Packs/Day:  1.0    Caffeine use/day:  3 beverages daily    Does Patient Exercise:  no   Alcohol Use - yes    Family History  Problem Relation Age of Onset  . Coronary artery disease    . Hyperlipidemia    . Hypertension    . Prostate cancer    . Pulmonary embolism Mother     died of PE  . Heart disease Mother     before age 28  . Anuerysm Father     history of popliteal  . Other Neg Hx     polycystic kidney disease, and colon cancer  . Colon cancer Neg Hx     Allergies as of 06/14/2015 - Review  Complete 06/14/2015  Allergen Reaction Noted  . Ace inhibitors      Current Outpatient Prescriptions on File Prior to Visit  Medication Sig Dispense Refill  . amLODipine (NORVASC) 10 MG tablet Take 1 tablet (10 mg total) by mouth every evening. 90 tablet 1  . atorvastatin (LIPITOR) 40 MG tablet TAKE 1 TABLET ONE TIME DAILY 90 tablet 1  . calcium elemental as carbonate (BARIATRIC TUMS ULTRA) 400 MG chewable tablet     . Coenzyme Q10 200 MG capsule Take 200 mg by mouth daily.    . furosemide (LASIX) 40 MG tablet Take 1 tablet (40 mg total) by mouth daily with lunch. 90 tablet 1  . isosorbide dinitrate (ISORDIL) 20 MG tablet Take 1 tablet (20 mg total) by mouth 3 (three) times daily. 270 tablet 1  . losartan (COZAAR) 50 MG tablet Take 1 tablet (50 mg total) by mouth every evening. 90 tablet 1  . metoprolol (LOPRESSOR) 100 MG tablet Take 1 tablet (100 mg total) by mouth 2 (two) times daily. 180 tablet 1  . Multiple Vitamins-Minerals (MACUVITE EYE CARE PO) Take 1 capsule by mouth daily.    . Multiple Vitamins-Minerals (SENIOR MULTIVITAMIN PLUS PO) Take 1 tablet by mouth daily.    . Omega-3 Fatty Acids (FISH OIL) 1200 MG CAPS Take 1,200 mg by mouth daily.     . ranitidine (ZANTAC) 150 MG tablet Take 1 tablet (150 mg total) by mouth 2 (two) times daily. 180 tablet 1  . sertraline (ZOLOFT) 100 MG tablet Take 1 tablet (100 mg total) by mouth every evening. 90 tablet 1  . tamsulosin (FLOMAX) 0.4 MG CAPS capsule TAKE 1 CAPSULE (0.4 MG TOTAL) DAILY AFTER BREAKFAST. 90 capsule 1  . warfarin (COUMADIN) 5 MG tablet Take 1 tablet (5 mg total) by mouth daily. 30 tablet 3   No current facility-administered medications on file prior to visit.     REVIEW OF SYSTEMS: See history of present illness otherwise negative  PHYSICAL EXAMINATION:   Vital signs are  Filed Vitals:   06/14/15 0909 06/14/15 0913  BP: 145/99 137/100  Pulse: 74   Height: 5\' 11"  (1.803 m)   Weight: 236 lb (107.049 kg)   SpO2: 95%     Body mass index is 32.93 kg/(m^2). General: The  patient appears their stated age. HEENT:  No gross abnormalities Pulmonary:  Non labored breathing Abdomen: Soft and non-tender.  No pulsatile mass Musculoskeletal: There are no major deformities. Neurologic: No focal weakness or paresthesias are detected, Skin: There are no ulcer or rashes noted. Psychiatric: The patient has normal affect. Cardiovascular: There is a regular rate and rhythm without significant murmur appreciated.  Palpable pedal pulses.  No carotid bruits   Diagnostic Studies I have reviewed his ultrasound today.  No endoleak is identified.  Aneurysm sac measures 8.7 x 7.3.  Assessment: Status post endovascular aneurysm repair Plan: The patient's ultrasound today shows a decrease in the size of the aneurysm sac, significantly decreased from the CT scan in 2015.  The patient has been getting CAT scans for his renal cell, at his urologist which I do not have access to.  I have made a request to get a copy of the scans as well as the reports.  Fortunately today, his ultrasound shows a significant decrease in the size of his aneurysm sac.  I will review his outside scans.  He is scheduled for follow-up again in 6 months.  We did discuss referral to pulmonology to evaluate his shortness of breath.  The patient is not interested at this time.  Eldridge Abrahams, M.D. [facility] Office: [phone] Pager:  [phone]

## 2015-06-15 ENCOUNTER — Telehealth: Payer: Self-pay | Admitting: Surgery

## 2015-06-15 NOTE — Telephone Encounter (Signed)
requested ct scan, images and reports from last year, 2016 for dr. Trula Slade

## 2015-06-30 ENCOUNTER — Telehealth: Payer: Self-pay | Admitting: Surgery

## 2015-07-01 NOTE — Telephone Encounter (Signed)
Rec'vd ct report from Dr. Ida Rogue office, put in Dr. Stephens Shire in-box

## 2015-07-05 DIAGNOSIS — Z0000 Encounter for general adult medical examination without abnormal findings: Secondary | ICD-10-CM | POA: Diagnosis not present

## 2015-07-05 DIAGNOSIS — Z905 Acquired absence of kidney: Secondary | ICD-10-CM | POA: Diagnosis not present

## 2015-07-05 DIAGNOSIS — N183 Chronic kidney disease, stage 3 (moderate): Secondary | ICD-10-CM | POA: Diagnosis not present

## 2015-07-05 DIAGNOSIS — C642 Malignant neoplasm of left kidney, except renal pelvis: Secondary | ICD-10-CM | POA: Diagnosis not present

## 2015-07-09 ENCOUNTER — Encounter: Payer: Self-pay | Admitting: Family

## 2015-07-10 MED ORDER — WARFARIN SODIUM 5 MG PO TABS: 5.0000 mg | ORAL_TABLET | Freq: Every day | ORAL | Status: DC

## 2015-07-10 MED ORDER — FUROSEMIDE 40 MG PO TABS: 40.0000 mg | ORAL_TABLET | Freq: Two times a day (BID) | ORAL | Status: DC

## 2015-07-16 ENCOUNTER — Telehealth: Payer: Self-pay | Admitting: Family

## 2015-07-16 MED ORDER — LOSARTAN POTASSIUM 50 MG PO TABS: 50.0000 mg | ORAL_TABLET | Freq: Every evening | ORAL | Status: DC

## 2015-07-16 MED ORDER — FUROSEMIDE 40 MG PO TABS: 40.0000 mg | ORAL_TABLET | Freq: Two times a day (BID) | ORAL | Status: DC

## 2015-07-16 NOTE — Telephone Encounter (Signed)
Relation to [id number] Call back number:[phone] Pharmacy:  Reason for call:  Patient requesting a 30 day supply of furosemide (LASIX) 40 MG tablet and losartan (COZAAR) 50 MG tablet please send to retail  [facility] [place], [place] [phone] (Phone) [phone] (Fax)       Which will hold patient over until mail order is delivered.

## 2015-07-16 NOTE — Telephone Encounter (Signed)
30 day supply of furosemide and losartan. Notified pt. He also requests 90 day Rx of Losartan to [facility]. Rx sent.

## 2015-09-21 ENCOUNTER — Telehealth: Payer: Self-pay | Admitting: *Deleted

## 2015-09-21 NOTE — Telephone Encounter (Signed)
Unable to reach patient at time of pre-visit call. Left message for patient to return call when available.  

## 2015-09-22 ENCOUNTER — Ambulatory Visit (INDEPENDENT_AMBULATORY_CARE_PROVIDER_SITE_OTHER): Payer: Commercial Managed Care - HMO | Admitting: Family

## 2015-09-22 ENCOUNTER — Encounter: Payer: Self-pay | Admitting: Family

## 2015-09-22 VITALS — BP 144/90 | HR 78 | Temp 97.8°F | Resp 18 | Ht 71.0 in | Wt 233.4 lb

## 2015-09-22 DIAGNOSIS — E785 Hyperlipidemia, unspecified: Secondary | ICD-10-CM

## 2015-09-22 DIAGNOSIS — N400 Enlarged prostate without lower urinary tract symptoms: Secondary | ICD-10-CM

## 2015-09-22 DIAGNOSIS — Z0001 Encounter for general adult medical examination with abnormal findings: Secondary | ICD-10-CM

## 2015-09-22 DIAGNOSIS — I10 Essential (primary) hypertension: Secondary | ICD-10-CM | POA: Diagnosis not present

## 2015-09-22 DIAGNOSIS — R0609 Other forms of dyspnea: Secondary | ICD-10-CM

## 2015-09-22 DIAGNOSIS — Z0000 Encounter for general adult medical examination without abnormal findings: Secondary | ICD-10-CM

## 2015-09-22 DIAGNOSIS — K219 Gastro-esophageal reflux disease without esophagitis: Secondary | ICD-10-CM

## 2015-09-22 DIAGNOSIS — N259 Disorder resulting from impaired renal tubular function, unspecified: Secondary | ICD-10-CM

## 2015-09-22 LAB — BASIC METABOLIC PANEL
BUN: 34 mg/dL — AB (ref 6–23)
CHLORIDE: 106 meq/L (ref 96–112)
CO2: 28 meq/L (ref 19–32)
CREATININE: 1.97 mg/dL — AB (ref 0.40–1.50)
Calcium: 9.1 mg/dL (ref 8.4–10.5)
GFR: 35.66 mL/min — ABNORMAL LOW (ref >60.00)
GLUCOSE: 85 mg/dL (ref 70–99)
Potassium: 3.7 mEq/L (ref 3.5–5.1)
Sodium: 142 mEq/L (ref 135–145)

## 2015-09-22 LAB — PSA: PSA: 2.13 ng/mL (ref 0.10–4.00)

## 2015-09-22 NOTE — Assessment & Plan Note (Signed)
Patient has upcoming apt with Dr. Posey Pronto.  (nephrology)

## 2015-09-22 NOTE — Assessment & Plan Note (Signed)
BP is acceptable for age, continue current meds.

## 2015-09-22 NOTE — Progress Notes (Signed)
Subjective:    Patient ID: Mark [name], male    DOB: 01-04-1941, 75 y.o.   MRN: [id number]  HPI  Patient presents today for complete physical.  Immunizations: up to date except for zostavax Diet: reports that he likes fried chicken.  Exercise: not exercising Colonoscopy: 2004- due Vision: due Dental: overdue  HTN- mainained on lasix, isosorbide, metoprolol, losartan. He does report + DOE.  He reports that this has been present for some time.    BP Readings from Last 3 Encounters:  09/22/15 144/90  06/14/15 137/100  05/12/15 116/84   Hyperlipidemia- continues atorvastatin 40mg .  Lab Results  Component Value Date   CHOL 126 06/01/2015   HDL 37.40* 06/01/2015   LDLCALC 69 06/01/2015   TRIG 98.0 06/01/2015   CHOLHDL 3 06/01/2015   Depression- zoloft, reports that his mood is fair.  Does have a short fuse.    GERD- reports that gerd is stable on bid zantac.   Review of Systems  Constitutional: Negative for unexpected weight change.  HENT: Negative for hearing loss and rhinorrhea.   Eyes: Negative for visual disturbance.  Respiratory:       Occasional mild cough  Cardiovascular: Negative for chest pain and leg swelling.  Gastrointestinal: Negative for diarrhea and constipation.  Endocrine: Positive for polydipsia.  Genitourinary:       Nocturia- reports that this relates to water intake  Musculoskeletal: Negative for myalgias and arthralgias.  Skin: Negative for rash.  Neurological: Negative for headaches.  Hematological: Negative for adenopathy.  Psychiatric/Behavioral:       Denies current concerns with anxiety/depression   Past Medical History  Diagnosis Date  . Hypertension   . Hyperlipidemia   . CAD (coronary artery disease)   . History of nephrolithiasis   . GERD (gastroesophageal reflux disease)   . Depression   . History of skin cancer   . History of pneumothorax     LONG TIME AGO  . Atrial fibrillation ([place])   . Dysrhythmia     AF  . Renal  cell carcinoma ([place])   . Chronic kidney disease     CHRONIC RENAL INSUFFICIENCY  . Arthritis     LEFT HAND, RT KNEE  . Cancer (HCC)     BASAL CELL CA OF NOSE  . Shortness of breath   . Myocardial infarction ([place]) 2006  . Shingles outbreak     PT NOTICED SKIN RASH RT GROIN AND RIGHT TRUNK ON MONDAY 10/27/13 - NOT A LOT OF DISCOMFORT- NOT ON ANY ORAL MEDS TO TREAT.  Marland Kitchen AAA (abdominal aortic aneurysm) (HCC)     9.1 cm repair 05/2009 ( ENDOVASCULAR REPAIR ).  MORE RECENT RE-EVALUATION OF ENLARGING AAA BY DR. Trula Slade AND BY DR. Kathlene Cote  MAY 2015 - PT STATES HE UNDERSTANDS THAT NOTHING IS LEAKING AT PRESENT TIME AND HE IS TO FOLLOW UP WITH HIS DOCTORS FOR FUTURE MONITORING I     Social History   Social History  . Marital Status: Divorced    Spouse Name: N/A  . Number of Children: 2  . Years of Education: N/A   Occupational History  .     Social History Main Topics  . Smoking status: Former Smoker    Quit date: 08/24/1980  . Smokeless tobacco: Never Used     Comment: quit   . Alcohol Use: 1.5 oz/week    3 drink(s) per week     Comment: Occasional  . Drug Use: No  . Sexual Activity: Not on  file   Other Topics Concern  . Not on file   Social History Narrative   Retired   Divorced   2 sons 39 &30     Occupation:  Part time sports broadcaster    Smoking Status:  quit (08/24/1980)   Packs/Day:  1.0    Caffeine use/day:  3 beverages daily    Does Patient Exercise:  no   Alcohol Use - yes    Past Surgical History  Procedure Laterality Date  . Cardiac catheterization  06/2004  . Abdominal aortic aneurysm repair  JAN 2011    ENDOVASCULAR - DR. BRABHAM  . Tonsillectomy    . Athroscopic knee surgery      RIGHT KNEE  . Lithotripsy    . Robot assisted laparoscopic nephrectomy Left 11/06/2013    Procedure: ROBOTIC ASSISTED LAPAROSCOPIC RADICAL  NEPHRECTOMY;  Surgeon: Alexis Frock, MD;  Location: [facility];  Service: Urology;  Laterality: Left;  . Abdominal aortagram N/A 09/24/2013      Procedure: ABDOMINAL Maxcine Ham;  Surgeon: Serafina Mitchell, MD;  Location: [facility] CATH LAB;  Service: Cardiovascular;  Laterality: N/A;  . Nephrectomy      Family History  Problem Relation Age of Onset  . Coronary artery disease    . Hyperlipidemia    . Hypertension    . Prostate cancer    . Pulmonary embolism Mother     died of PE  . Heart disease Mother     before age 80  . Anuerysm Father     history of popliteal  . Other Neg Hx     polycystic kidney disease, and colon cancer  . Colon cancer Neg Hx     Allergies  Allergen Reactions  . Ace Inhibitors     REACTION: Cough    Current Outpatient Prescriptions on File Prior to Visit  Medication Sig Dispense Refill  . amLODipine (NORVASC) 10 MG tablet Take 1 tablet (10 mg total) by mouth every evening. 90 tablet 1  . atorvastatin (LIPITOR) 40 MG tablet TAKE 1 TABLET ONE TIME DAILY 90 tablet 1  . calcium elemental as carbonate (BARIATRIC TUMS ULTRA) 400 MG chewable tablet     . Coenzyme Q10 200 MG capsule Take 200 mg by mouth daily.    . furosemide (LASIX) 40 MG tablet Take 1 tablet (40 mg total) by mouth 2 (two) times daily. 60 tablet 0  . isosorbide dinitrate (ISORDIL) 20 MG tablet Take 1 tablet (20 mg total) by mouth 3 (three) times daily. 270 tablet 1  . losartan (COZAAR) 50 MG tablet Take 1 tablet (50 mg total) by mouth every evening. 90 tablet 1  . metoprolol (LOPRESSOR) 100 MG tablet Take 1 tablet (100 mg total) by mouth 2 (two) times daily. 180 tablet 1  . Multiple Vitamins-Minerals (MACUVITE EYE CARE PO) Take 1 capsule by mouth daily.    . Multiple Vitamins-Minerals (SENIOR MULTIVITAMIN PLUS PO) Take 1 tablet by mouth daily.    . Omega-3 Fatty Acids (FISH OIL) 1200 MG CAPS Take 1,200 mg by mouth daily.     . ranitidine (ZANTAC) 150 MG tablet Take 1 tablet (150 mg total) by mouth 2 (two) times daily. 180 tablet 1  . sertraline (ZOLOFT) 100 MG tablet Take 1 tablet (100 mg total) by mouth every evening. 90 tablet 1  .  tamsulosin (FLOMAX) 0.4 MG CAPS capsule TAKE 1 CAPSULE (0.4 MG TOTAL) DAILY AFTER BREAKFAST. 90 capsule 1  . warfarin (COUMADIN) 5 MG tablet Take 1 tablet (5  mg total) by mouth daily. 90 tablet 1   No current facility-administered medications on file prior to visit.    BP 144/90 mmHg  Pulse 78  Temp(Src) 97.8 F (36.6 C) (Oral)  Resp 18  Ht 5\' 11"  (1.803 m)  Wt 233 lb 6.4 oz (105.87 kg)  BMI 32.57 kg/m2  SpO2 97%        Objective:   Physical Exam Physical Exam  Constitutional: He is oriented to person, place, and time. He appears well-developed and well-nourished. No distress.  HENT:  Head: Normocephalic and atraumatic.  Right Ear: Tympanic membrane and ear canal normal.  Left Ear: Tympanic membrane and ear canal normal.  Mouth/Throat: Oropharynx is clear and moist.  Eyes: Pupils are equal, round, and reactive to light. No scleral icterus.  Neck: Normal range of motion. No thyromegaly present.  Cardiovascular: Normal rate and regular rhythm.   No murmur heard. Pulmonary/Chest: Effort normal and breath sounds normal. No respiratory distress. He has no wheezes. He has no rales. He exhibits no tenderness.  Abdominal: Soft. Bowel sounds are normal. He exhibits no distension and no mass. There is no tenderness. There is no rebound and no guarding.  Musculoskeletal: He exhibits no edema.  Lymphadenopathy:    He has no cervical adenopathy.  Neurological: He is alert and oriented to person, place, and time. He has normal patellar reflexes. He exhibits normal muscle tone. Coordination normal.  Skin: Skin is warm and dry.  Psychiatric: He has a normal mood and affect. His behavior is normal. Judgment and thought content normal.          Assessment & Plan:          Assessment & Plan:  DOE- will refer for echo, refer back to Dr. Stanford Breed (cardiology) for further evaluation.  Likely related to AF and deconditioning, however will defer further work up to cardiology. EKG  today. Notes AF- rate stable.

## 2015-09-22 NOTE — Assessment & Plan Note (Addendum)
Discussed healthy diet, exercise, weight loss.  Advised him to check with insurance re: zostavax coverage.  Can schedule nurse visit if covered.  Declines colo, agreeable to cologuard.

## 2015-09-22 NOTE — Assessment & Plan Note (Signed)
Continues flomax, obtain psa.

## 2015-09-22 NOTE — Progress Notes (Signed)
Pre visit review using our clinic review tool, if applicable. No additional management support is needed unless otherwise documented below in the visit note. 

## 2015-09-22 NOTE — Assessment & Plan Note (Signed)
Tolerating statin, obtain lipid panel.  

## 2015-09-22 NOTE — Patient Instructions (Addendum)
Please complete lab work piror to leaving. Try to walk every day- goal is 30 minutes.   You will be mailed the cologuard test. Please complete and return at your earliest convenience.  We will set up your echocardiogram to check your heart and valves and you should be contacted about this appointment. You are due for follow up with Dr. Stanford Breed- we will arrange. Please schedule vision and dental appointments at your earliest convenience. Contact your insurance re: coverage for your shingles vaccine. Please schedule a medicare wellness visit with Mark Pina RN at the front desk.  Follow up in 6 months for routine office visit.

## 2015-09-22 NOTE — Assessment & Plan Note (Signed)
Stable on zantac, continue same.  

## 2015-09-23 ENCOUNTER — Encounter: Payer: Self-pay | Admitting: Family

## 2015-09-27 ENCOUNTER — Telehealth: Payer: Self-pay | Admitting: *Deleted

## 2015-09-27 NOTE — Telephone Encounter (Signed)
Order entered in [employer] portal and letter mailed to pt.

## 2015-09-27 NOTE — Telephone Encounter (Signed)
-----   Message from [name], NP sent at 09/22/2015  9:38 AM EDT ----- Could you please arrange cologuard?

## 2015-09-28 ENCOUNTER — Telehealth: Payer: Self-pay | Admitting: Family

## 2015-09-28 NOTE — Telephone Encounter (Signed)
Caller name:Lino Valcarcel Relationship to patient: Can be reached:[phone] Pharmacy:  Reason for call:Patient wants to know if its ok for him to hold off on Echocardiogram until he sees Cardiology. Please advise.

## 2015-09-28 NOTE — Telephone Encounter (Signed)
Notified pt and he voices understanding. Gave pt # for [facility] to schedule echo at [place].

## 2015-09-28 NOTE — Telephone Encounter (Signed)
I would recommend that he complete if able, cardiology will want to have those results to review at his apt.

## 2015-09-29 ENCOUNTER — Ambulatory Visit (HOSPITAL_BASED_OUTPATIENT_CLINIC_OR_DEPARTMENT_OTHER): Payer: Commercial Managed Care - HMO

## 2015-10-06 ENCOUNTER — Telehealth: Payer: Self-pay | Admitting: Family

## 2015-10-06 MED ORDER — AMLODIPINE BESYLATE 10 MG PO TABS: 10.0000 mg | ORAL_TABLET | Freq: Every evening | ORAL | Status: DC

## 2015-10-06 MED ORDER — TAMSULOSIN HCL 0.4 MG PO CAPS: ORAL_CAPSULE | ORAL | Status: DC

## 2015-10-06 MED ORDER — METOPROLOL TARTRATE 100 MG PO TABS: 100.0000 mg | ORAL_TABLET | Freq: Two times a day (BID) | ORAL | Status: DC

## 2015-10-06 MED ORDER — SERTRALINE HCL 100 MG PO TABS: 100.0000 mg | ORAL_TABLET | Freq: Every evening | ORAL | Status: DC

## 2015-10-06 MED ORDER — RANITIDINE HCL 150 MG PO TABS: 150.0000 mg | ORAL_TABLET | Freq: Two times a day (BID) | ORAL | Status: DC

## 2015-10-06 MED ORDER — ISOSORBIDE DINITRATE 20 MG PO TABS: 20.0000 mg | ORAL_TABLET | Freq: Three times a day (TID) | ORAL | Status: DC

## 2015-10-06 MED ORDER — ATORVASTATIN CALCIUM 40 MG PO TABS: ORAL_TABLET | ORAL | Status: DC

## 2015-10-06 NOTE — Telephone Encounter (Signed)
Caller name: Self  Can be reached: [phone]   Pharmacy:  [facility], [place] [phone] (Phone) [phone] (Fax)         Reason for call: Request refills on isosorbide dinitrate (ISORDIL) 20 MG tablet   ranitidine (ZANTAC) 150 MG tablet   metoprolol (LOPRESSOR) 100 MG tablet   amLODipine (NORVASC) 10 MG tablet   atorvastatin (LIPITOR) 40 MG tablet   tamsulosin (FLOMAX) 0.4 MG CAPS capsule [id number]   sertraline (ZOLOFT) 100 MG tablet [id number]

## 2015-10-06 NOTE — Telephone Encounter (Signed)
Refills sent

## 2015-10-11 DIAGNOSIS — Z1211 Encounter for screening for malignant neoplasm of colon: Secondary | ICD-10-CM | POA: Diagnosis not present

## 2015-10-11 DIAGNOSIS — Z1212 Encounter for screening for malignant neoplasm of rectum: Secondary | ICD-10-CM | POA: Diagnosis not present

## 2015-10-11 LAB — COLOGUARD: Cologuard: NEGATIVE

## 2015-10-13 ENCOUNTER — Ambulatory Visit (HOSPITAL_BASED_OUTPATIENT_CLINIC_OR_DEPARTMENT_OTHER)
Admission: RE | Admit: 2015-10-13 | Discharge: 2015-10-13 | Disposition: A | Discharge status: Home / Self Care | Payer: Commercial Managed Care - HMO | Source: Ambulatory Visit | Attending: Family | Admitting: Family

## 2015-10-13 DIAGNOSIS — R0609 Other forms of dyspnea: Secondary | ICD-10-CM | POA: Diagnosis not present

## 2015-10-13 DIAGNOSIS — I351 Nonrheumatic aortic (valve) insufficiency: Secondary | ICD-10-CM | POA: Insufficient documentation

## 2015-10-13 DIAGNOSIS — I2510 Atherosclerotic heart disease of native coronary artery without angina pectoris: Secondary | ICD-10-CM | POA: Diagnosis not present

## 2015-10-13 DIAGNOSIS — I340 Nonrheumatic mitral (valve) insufficiency: Secondary | ICD-10-CM | POA: Insufficient documentation

## 2015-10-13 DIAGNOSIS — I77810 Thoracic aortic ectasia: Secondary | ICD-10-CM | POA: Insufficient documentation

## 2015-10-13 DIAGNOSIS — I119 Hypertensive heart disease without heart failure: Secondary | ICD-10-CM | POA: Diagnosis not present

## 2015-10-13 DIAGNOSIS — E785 Hyperlipidemia, unspecified: Secondary | ICD-10-CM | POA: Diagnosis not present

## 2015-10-13 DIAGNOSIS — R0600 Dyspnea, unspecified: Secondary | ICD-10-CM | POA: Diagnosis present

## 2015-10-13 DIAGNOSIS — I4891 Unspecified atrial fibrillation: Secondary | ICD-10-CM | POA: Diagnosis not present

## 2015-10-13 NOTE — Progress Notes (Signed)
  Echocardiogram 2D Echocardiogram has been performed.  Mark [name] 10/13/2015, 10:28 AM

## 2015-10-14 ENCOUNTER — Telehealth: Payer: Self-pay | Admitting: Family

## 2015-10-14 DIAGNOSIS — I482 Chronic atrial fibrillation, unspecified: Secondary | ICD-10-CM

## 2015-10-14 NOTE — Telephone Encounter (Signed)
Please contact pt and let him know that I see he is overdue for PT/INR check.  Please schedule nurse visit today or tomorrow.

## 2015-10-14 NOTE — Telephone Encounter (Signed)
See mychart.  

## 2015-10-15 ENCOUNTER — Ambulatory Visit (INDEPENDENT_AMBULATORY_CARE_PROVIDER_SITE_OTHER): Payer: Commercial Managed Care - HMO | Admitting: *Deleted

## 2015-10-15 DIAGNOSIS — I4891 Unspecified atrial fibrillation: Secondary | ICD-10-CM

## 2015-10-15 LAB — POCT INR: INR: 1.8

## 2015-10-15 NOTE — Progress Notes (Signed)
Pre visit review using our clinic review tool, if applicable. No additional management support is needed unless otherwise documented below in the visit note.  INR today 1.8. Pt findings negative, however, pt has not had INR check since 05/2015. Discussed the importance of regular INR checks w/ pt, and instructed him that he will need at least monthly INR checks going forward. He understands that he is responsible for scheduling his follow-up appointments as directed. Also discussed that if he is in the office for an appt w/ provider, and due/near due for INR check, that we can check INR during office visit to keep him from having to make multiple trips.  Per [name], NP: Take 5 mg each day, except take 7.5 mg on Wednesdays. Return in 2 weeks for INR check.  Next appt: 10/29/15   Pt verbalized understanding of instructions.  Dorrene German, RN

## 2015-10-15 NOTE — Progress Notes (Signed)
Noted and agree. 

## 2015-10-15 NOTE — Patient Instructions (Signed)
Per [name], NP: Take 5 mg each day, except take 7.5 mg on Wednesdays. Return in 2 weeks for INR check.

## 2015-10-28 ENCOUNTER — Encounter: Payer: Self-pay | Admitting: Family

## 2015-10-29 ENCOUNTER — Ambulatory Visit (INDEPENDENT_AMBULATORY_CARE_PROVIDER_SITE_OTHER): Payer: Commercial Managed Care - HMO | Admitting: Behavioral Health

## 2015-10-29 DIAGNOSIS — I4891 Unspecified atrial fibrillation: Secondary | ICD-10-CM | POA: Diagnosis not present

## 2015-10-29 LAB — POCT INR: INR: 2.3

## 2015-10-29 NOTE — Progress Notes (Signed)
Pre visit review using our clinic review tool, if applicable. No additional management support is needed unless otherwise documented below in the visit note.  Patient presents in clinic for INR check. Today's reading was 2.3. He reported missing a dose of Coumadin this past Tuesday evening; Patient did not verbalize any other positive findings.  Per Elyn Aquas, PA-C: Continue taking 5 mg each day, except take 7.5 mg on Wednesdays. Please be sure to take all doses and return to clinic in 2 weeks for INR check.  Patient was made aware of the provider's instructions and voiced understanding.

## 2015-10-29 NOTE — Patient Instructions (Signed)
Per Elyn Aquas, PA-C: Continue taking 5 mg each day, except take 7.5 mg on Wednesdays. Please be sure to take all doses and return to clinic in 2 weeks for INR check.

## 2015-10-30 NOTE — Progress Notes (Signed)
RN note reviewed. Plan is as stated.   Leeanne Rio, PA-C

## 2015-11-03 NOTE — Progress Notes (Signed)
HPI: FU atrial fibrillation, coronary disease, hypertension and hyperlipidemia. The patient's cardiac history dates back to 2006 when he had PCI of his LAD and first diagonal. Note his LV function was normal. He also had a subtotal of his PDA. Myoview in July of 2014 showed ejection fraction 69% and normal perfusion. Patient also with h/o atrial fibrillation treated with rate control and anticoagulation. Holter monitor in April of 2015 showed atrial fibrillation with PVCs or aberrantly conducted beats; rate controlled. Patient had repair of AAA in Jan 2011. Abd CTA 5/15 showed enargement of aneurysm sac and endoleak could not be excluded. Now followed by vascular surgery. Last echocardiogram June 2017 showed normal LV systolic function, moderate biatrial enlargement, mild MR, dilated aortic root. Since last seen, He has dyspnea on exertion but no orthopnea, PND, pedal edema, chest pain, palpitations, syncope or bleeding.   Current Outpatient Prescriptions  Medication Sig Dispense Refill  . amLODipine (NORVASC) 10 MG tablet Take 1 tablet (10 mg total) by mouth every evening. 90 tablet 1  . atorvastatin (LIPITOR) 40 MG tablet TAKE 1 TABLET ONE TIME DAILY 90 tablet 1  . calcium elemental as carbonate (BARIATRIC TUMS ULTRA) 400 MG chewable tablet     . Coenzyme Q10 200 MG capsule Take 200 mg by mouth daily.    . furosemide (LASIX) 40 MG tablet Take 1 tablet (40 mg total) by mouth 2 (two) times daily. 60 tablet 0  . isosorbide dinitrate (ISORDIL) 20 MG tablet Take 1 tablet (20 mg total) by mouth 3 (three) times daily. 270 tablet 1  . losartan (COZAAR) 50 MG tablet Take 1 tablet (50 mg total) by mouth every evening. 90 tablet 1  . metoprolol (LOPRESSOR) 100 MG tablet Take 1 tablet (100 mg total) by mouth 2 (two) times daily. 180 tablet 1  . Multiple Vitamins-Minerals (MACUVITE EYE CARE PO) Take 1 capsule by mouth daily.    . Multiple Vitamins-Minerals (SENIOR MULTIVITAMIN PLUS PO) Take 1 tablet by  mouth daily.    . Omega-3 Fatty Acids (FISH OIL) 1200 MG CAPS Take 1,200 mg by mouth daily.     . ranitidine (ZANTAC) 150 MG tablet Take 1 tablet (150 mg total) by mouth 2 (two) times daily. 180 tablet 1  . sertraline (ZOLOFT) 100 MG tablet Take 1 tablet (100 mg total) by mouth every evening. 90 tablet 1  . tamsulosin (FLOMAX) 0.4 MG CAPS capsule TAKE 1 CAPSULE (0.4 MG TOTAL) DAILY AFTER BREAKFAST. 90 capsule 1  . warfarin (COUMADIN) 5 MG tablet Take 1 tablet (5 mg total) by mouth daily. 90 tablet 1   No current facility-administered medications for this visit.     Past Medical History  Diagnosis Date  . Hypertension   . Hyperlipidemia   . CAD (coronary artery disease)   . History of nephrolithiasis   . GERD (gastroesophageal reflux disease)   . Depression   . History of skin cancer   . History of pneumothorax     LONG TIME AGO  . Atrial fibrillation ([place])   . Dysrhythmia     AF  . Renal cell carcinoma ([place])   . Chronic kidney disease     CHRONIC RENAL INSUFFICIENCY  . Arthritis     LEFT HAND, RT KNEE  . Cancer (HCC)     BASAL CELL CA OF NOSE  . Shortness of breath   . Myocardial infarction ([place]) 2006  . Shingles outbreak     PT NOTICED SKIN RASH RT GROIN  AND RIGHT TRUNK ON MONDAY 10/27/13 - NOT A LOT OF DISCOMFORT- NOT ON ANY ORAL MEDS TO TREAT.  Marland Kitchen AAA (abdominal aortic aneurysm) (HCC)     9.1 cm repair 05/2009 ( ENDOVASCULAR REPAIR ).  MORE RECENT RE-EVALUATION OF ENLARGING AAA BY DR. Trula Slade AND BY DR. Kathlene Cote  MAY 2015 - PT STATES HE UNDERSTANDS THAT NOTHING IS LEAKING AT PRESENT TIME AND HE IS TO FOLLOW UP WITH HIS DOCTORS FOR FUTURE MONITORING I    Past Surgical History  Procedure Laterality Date  . Cardiac catheterization  06/2004  . Abdominal aortic aneurysm repair  JAN 2011    ENDOVASCULAR - DR. BRABHAM  . Tonsillectomy    . Athroscopic knee surgery      RIGHT KNEE  . Lithotripsy    . Robot assisted laparoscopic nephrectomy Left 11/06/2013    Procedure: ROBOTIC  ASSISTED LAPAROSCOPIC RADICAL  NEPHRECTOMY;  Surgeon: Alexis Frock, MD;  Location: [facility];  Service: Urology;  Laterality: Left;  . Abdominal aortagram N/A 09/24/2013    Procedure: ABDOMINAL Maxcine Ham;  Surgeon: Serafina Mitchell, MD;  Location: [facility] CATH LAB;  Service: Cardiovascular;  Laterality: N/A;  . Nephrectomy      Social History   Social History  . Marital Status: Divorced    Spouse Name: N/A  . Number of Children: 2  . Years of Education: N/A   Occupational History  .     Social History Main Topics  . Smoking status: Former Smoker    Quit date: 08/24/1980  . Smokeless tobacco: Never Used     Comment: quit   . Alcohol Use: 1.5 oz/week    3 Standard drinks or equivalent per week     Comment: Occasional  . Drug Use: No  . Sexual Activity: Not on file   Other Topics Concern  . Not on file   Social History Narrative   Retired   Divorced   2 sons 18 &30     Occupation:  Part time sports Tax adviser    Smoking Status:  quit (08/24/1980)   Packs/Day:  1.0    Caffeine use/day:  3 beverages daily    Does Patient Exercise:  no   Alcohol Use - yes    Family History  Problem Relation Age of Onset  . Coronary artery disease    . Hyperlipidemia    . Hypertension    . Prostate cancer    . Pulmonary embolism Mother     died of PE  . Heart disease Mother     before age 59  . Anuerysm Father     history of popliteal  . Other Neg Hx     polycystic kidney disease, and colon cancer  . Colon cancer Neg Hx     ROS: no fevers or chills, productive cough, hemoptysis, dysphasia, odynophagia, melena, hematochezia, dysuria, hematuria, rash, seizure activity, orthopnea, PND, pedal edema, claudication. Remaining systems are negative.  Physical Exam: Well-developed well-nourished in no acute distress.  Skin is warm and dry.  HEENT is normal.  Neck is supple.  Chest diminished BS throughout Cardiovascular exam is irregular Abdominal exam nontender or distended. No masses  palpated. Extremities show no edema. neuro grossly intact  ECG 09/22/2015-atrial fibrillation, nonspecific ST changes.  Assessment and plan 1 hypertension-blood pressure controlled. Continue present medications. 2 coronary artery disease-continue statin. No aspirin given need for anticoagulation. 3 abdominal aortic aneurysm-followed by vascular surgery. 4 permanent atrial fibrillation-continue beta blocker for rate control. Continue Coumadin. Check hemoglobin. Patient given the  names of [place] and will contact us if he would like to change. 5 hyperlipidemia-continue statin. 6 renal insufficiency-followed by primary care.  Kirk Ruths, MD

## 2015-11-10 ENCOUNTER — Other Ambulatory Visit (INDEPENDENT_AMBULATORY_CARE_PROVIDER_SITE_OTHER): Payer: Commercial Managed Care - HMO

## 2015-11-10 ENCOUNTER — Encounter: Payer: Self-pay | Admitting: Cardiology

## 2015-11-10 ENCOUNTER — Ambulatory Visit (INDEPENDENT_AMBULATORY_CARE_PROVIDER_SITE_OTHER): Payer: Commercial Managed Care - HMO | Admitting: *Deleted

## 2015-11-10 ENCOUNTER — Ambulatory Visit (INDEPENDENT_AMBULATORY_CARE_PROVIDER_SITE_OTHER): Payer: Commercial Managed Care - HMO | Admitting: Cardiology

## 2015-11-10 ENCOUNTER — Other Ambulatory Visit: Payer: Commercial Managed Care - HMO

## 2015-11-10 VITALS — BP 115/72 | HR 71 | Ht 71.0 in | Wt 236.0 lb

## 2015-11-10 DIAGNOSIS — I2510 Atherosclerotic heart disease of native coronary artery without angina pectoris: Secondary | ICD-10-CM | POA: Diagnosis not present

## 2015-11-10 DIAGNOSIS — I480 Paroxysmal atrial fibrillation: Secondary | ICD-10-CM

## 2015-11-10 DIAGNOSIS — I4891 Unspecified atrial fibrillation: Secondary | ICD-10-CM

## 2015-11-10 DIAGNOSIS — I714 Abdominal aortic aneurysm, without rupture, unspecified: Secondary | ICD-10-CM

## 2015-11-10 DIAGNOSIS — I10 Essential (primary) hypertension: Secondary | ICD-10-CM

## 2015-11-10 DIAGNOSIS — E785 Hyperlipidemia, unspecified: Secondary | ICD-10-CM | POA: Diagnosis not present

## 2015-11-10 LAB — CBC
HEMATOCRIT: 38.5 % — AB (ref 39.0–52.0)
HEMOGLOBIN: 13.1 g/dL (ref 13.0–17.0)
MCHC: 33.9 g/dL (ref 30.0–36.0)
MCV: 91.1 fl (ref 78.0–100.0)
Platelets: 146 10*3/uL — ABNORMAL LOW (ref 150.0–400.0)
RBC: 4.23 Mil/uL (ref 4.22–5.81)
RDW: 15.9 % — AB (ref 11.5–15.5)
WBC: 6.4 10*3/uL (ref 4.0–10.5)

## 2015-11-10 LAB — POCT INR: INR: 2.1

## 2015-11-10 NOTE — Patient Instructions (Signed)
Per [name], NP: Continue taking 5 mg each day, except take 7.5 mg on Wednesdays. Please be sure to take all doses and return to clinic in 4 weeks for INR check.

## 2015-11-10 NOTE — Progress Notes (Signed)
Pre visit review using our clinic review tool, if applicable. No additional management support is needed unless otherwise documented below in the visit note.  Per [name], NP: Continue taking 5 mg each day, except take 7.5 mg on Wednesdays. Please be sure to take all doses and return to clinic in 4 weeks for INR check.  Dorrene German, RN

## 2015-11-10 NOTE — Patient Instructions (Signed)
Medication Instructions:   ELIQUIS OR XARELTO OR PRADAXA OR SAVAYSA TO REPLACE WARFARIN  Labwork:  Your physician recommends that you HAVE LAB WORK TODAY 2ND FLOOR  Follow-Up:  Your physician wants you to follow-up in: [place] will receive a reminder letter in the mail two months in advance. If you don't receive a letter, please call our office to schedule the follow-up appointment.   If you need a refill on your cardiac medications before your next appointment, please call your pharmacy.

## 2015-11-10 NOTE — Progress Notes (Signed)
Noted and agree. 

## 2015-12-03 ENCOUNTER — Other Ambulatory Visit: Payer: Self-pay

## 2015-12-03 DIAGNOSIS — Z95828 Presence of other vascular implants and grafts: Secondary | ICD-10-CM

## 2015-12-03 DIAGNOSIS — I714 Abdominal aortic aneurysm, without rupture, unspecified: Secondary | ICD-10-CM

## 2015-12-03 DIAGNOSIS — Z48812 Encounter for surgical aftercare following surgery on the circulatory system: Secondary | ICD-10-CM

## 2015-12-03 NOTE — Progress Notes (Signed)
Received verbal order from Dr. Trula Slade to cancel AAA [place], and to schedule pt. for CTA Abd/ Pelvis to evaluate status of EVAR of AAA,  prior to f/u appt. in August.

## 2015-12-08 ENCOUNTER — Ambulatory Visit (INDEPENDENT_AMBULATORY_CARE_PROVIDER_SITE_OTHER): Payer: Commercial Managed Care - HMO | Admitting: Behavioral Health

## 2015-12-08 DIAGNOSIS — I4891 Unspecified atrial fibrillation: Secondary | ICD-10-CM

## 2015-12-08 LAB — POCT INR: INR: 2.3

## 2015-12-08 NOTE — Progress Notes (Signed)
Noted and agree. 

## 2015-12-08 NOTE — Progress Notes (Signed)
Pre visit review using our clinic review tool, if applicable. No additional management support is needed unless otherwise documented below in the visit note.  Patient in clinic for INR check. Today's reading was 2.3. He did not report any changes with medications/diet or abnormal bleeding.   Per [name], NP: Continue taking 5 mg each day, except take 7.5 mg on Wednesdays. Return to clinic in 4 weeks for INR check.  Informed patient of the provider's instructions. He voiced understanding and did not have any concerns before leaving the nurse visit.  Next appointment scheduled for 01/05/16 at 10:30 AM.

## 2015-12-08 NOTE — Patient Instructions (Addendum)
  Per [name], NP: Continue taking 5 mg each day, except take 7.5 mg on Wednesdays. Return to clinic in 4 weeks for INR check.

## 2015-12-16 ENCOUNTER — Encounter: Payer: Self-pay | Admitting: Surgery

## 2015-12-20 ENCOUNTER — Other Ambulatory Visit (HOSPITAL_COMMUNITY): Payer: Commercial Managed Care - HMO

## 2015-12-20 ENCOUNTER — Other Ambulatory Visit: Payer: Commercial Managed Care - HMO

## 2015-12-20 ENCOUNTER — Ambulatory Visit: Payer: Commercial Managed Care - HMO | Admitting: Surgery

## 2015-12-21 ENCOUNTER — Encounter: Payer: Self-pay | Admitting: Urology

## 2015-12-28 ENCOUNTER — Other Ambulatory Visit: Payer: Self-pay | Admitting: Urology

## 2015-12-28 ENCOUNTER — Ambulatory Visit (HOSPITAL_COMMUNITY)
Admission: RE | Admit: 2015-12-28 | Discharge: 2015-12-28 | Disposition: A | Discharge status: Home / Self Care | Payer: Commercial Managed Care - HMO | Source: Ambulatory Visit | Attending: Urology | Admitting: Urology

## 2015-12-28 DIAGNOSIS — R918 Other nonspecific abnormal finding of lung field: Secondary | ICD-10-CM | POA: Diagnosis not present

## 2015-12-28 DIAGNOSIS — I517 Cardiomegaly: Secondary | ICD-10-CM | POA: Diagnosis not present

## 2015-12-28 DIAGNOSIS — C642 Malignant neoplasm of left kidney, except renal pelvis: Secondary | ICD-10-CM

## 2015-12-28 DIAGNOSIS — R938 Abnormal findings on diagnostic imaging of other specified body structures: Secondary | ICD-10-CM | POA: Diagnosis not present

## 2016-01-03 ENCOUNTER — Telehealth: Payer: Self-pay | Admitting: Family

## 2016-01-03 DIAGNOSIS — C649 Malignant neoplasm of unspecified kidney, except renal pelvis: Secondary | ICD-10-CM

## 2016-01-03 NOTE — Telephone Encounter (Signed)
I don't know a doctor named Dr. Tammi Klippel who is a nephrologist. Where is his office?

## 2016-01-03 NOTE — Telephone Encounter (Signed)
Noted, referral placed.  

## 2016-01-03 NOTE — Telephone Encounter (Signed)
Spoke with pt. He is seeing urologist, Alexis Frock Tuesday at 1:30pm for his history of renal cell carcinoma. Pt has [employer] and states insurance requires referral to specialist. Also wanted to make PCP aware that his aneurysm has increased to "9" and he is going to be having more workup by cardiology but is having to stretch it out due to cost. Pt also states that he fell last week after crossing the road and stepping off the shoulder of the road. Fell onto his left elbow and side. Has some soreness in both areas and states he tried acetaminophen 800mg  and it has eased the pain. I asked pt if he meant ibuprofen but he stated a friend gave him acetaminophen 800mg . Pt declines appt at this time and states he will call for appt if pain persists or worsens.

## 2016-01-03 NOTE — Telephone Encounter (Signed)
Pt called in requesting a referral to his kidney Dr. Tammi Klippel   Pt has an appt tomorrow at 1:30pm.

## 2016-01-04 DIAGNOSIS — N183 Chronic kidney disease, stage 3 (moderate): Secondary | ICD-10-CM | POA: Diagnosis not present

## 2016-01-04 DIAGNOSIS — C642 Malignant neoplasm of left kidney, except renal pelvis: Secondary | ICD-10-CM | POA: Diagnosis not present

## 2016-01-04 DIAGNOSIS — Z905 Acquired absence of kidney: Secondary | ICD-10-CM | POA: Diagnosis not present

## 2016-01-05 ENCOUNTER — Telehealth: Payer: Self-pay | Admitting: Family

## 2016-01-05 ENCOUNTER — Ambulatory Visit (HOSPITAL_BASED_OUTPATIENT_CLINIC_OR_DEPARTMENT_OTHER)
Admission: RE | Admit: 2016-01-05 | Discharge: 2016-01-05 | Disposition: A | Discharge status: Home / Self Care | Payer: Commercial Managed Care - HMO | Source: Ambulatory Visit | Attending: Family Medicine | Admitting: Family Medicine

## 2016-01-05 ENCOUNTER — Ambulatory Visit (INDEPENDENT_AMBULATORY_CARE_PROVIDER_SITE_OTHER): Payer: Commercial Managed Care - HMO | Admitting: Family Medicine

## 2016-01-05 ENCOUNTER — Ambulatory Visit (INDEPENDENT_AMBULATORY_CARE_PROVIDER_SITE_OTHER): Payer: Commercial Managed Care - HMO | Admitting: *Deleted

## 2016-01-05 ENCOUNTER — Encounter: Payer: Self-pay | Admitting: Family Medicine

## 2016-01-05 VITALS — BP 148/92 | HR 79 | Temp 97.9°F | Ht 71.0 in | Wt 242.6 lb

## 2016-01-05 DIAGNOSIS — X58XXXA Exposure to other specified factors, initial encounter: Secondary | ICD-10-CM | POA: Diagnosis not present

## 2016-01-05 DIAGNOSIS — R0781 Pleurodynia: Secondary | ICD-10-CM | POA: Diagnosis not present

## 2016-01-05 DIAGNOSIS — S2232XA Fracture of one rib, left side, initial encounter for closed fracture: Secondary | ICD-10-CM | POA: Diagnosis not present

## 2016-01-05 DIAGNOSIS — R0989 Other specified symptoms and signs involving the circulatory and respiratory systems: Secondary | ICD-10-CM | POA: Diagnosis not present

## 2016-01-05 DIAGNOSIS — I4891 Unspecified atrial fibrillation: Secondary | ICD-10-CM

## 2016-01-05 DIAGNOSIS — S299XXA Unspecified injury of thorax, initial encounter: Secondary | ICD-10-CM | POA: Diagnosis not present

## 2016-01-05 LAB — POCT INR: INR: 2.4

## 2016-01-05 NOTE — Progress Notes (Signed)
Pre visit review using our clinic review tool, if applicable. No additional management support is needed unless otherwise documented below in the visit note.  INR today 2.4. Pt findings negative.   Continue taking 5 mg each day, except take 7.5 mg on Wednesdays. Return to clinic in 4 weeks for INR check.  Pt had a fall on 12/30/15 and reports having some residual L rib cage pain and increased DOE from baseline since fall. Same day appt scheduled w/ Dr. Lorelei Pont.  Dorrene German, RN

## 2016-01-05 NOTE — Progress Notes (Signed)
Noted and agree. 

## 2016-01-05 NOTE — Progress Notes (Signed)
[facility] [address] [place], [place] [address] [phone]  Date:  01/05/2016   Name:  Mark [name]   DOB:  Jun 19, 1940   MRN:  [id number]  PCP:  Nance Pear., NP    Chief Complaint: Fall (Pt had a fall last Thursday 12/30/15 pt has had collapsed lung in the past and current sx's are similar. )   History of Present Illness:  Mark [name] is a 75 y.o. very pleasant male patient who presents with the following:  He came in today to check his INR- he is therapeutic.  He is on this due to a fib.  Eliquis was too expensive.  Last week he was crossing a street, hit an uneven area and fell.  He caught himself on his left arm and pushed his left elbow into his left ribs.  He felt a little like when he had his collapsed lung in the past and thought he should have this checked out.   He felt a pain in his left ribs with movement like getting out of bed, or with twisting his trunk that reminds him of when he had a pneumothorax as below  He was in a fib at his last EKG with Dr. Stanford Breed He cannot tell when he is in atrial fibrillation but we think he goes in and out Asked about SOB- he states this is a chronic state for him which he blames on a long history of smoking (now quit).  He may notice this when he climbs the 3 flights of stairs to his home when carrying groceries. This is stable over the last year or so. He does not have SOB except with fairly significant exertion.  No worsening since his fall  Lab Results  Component Value Date   INR 2.4 01/05/2016   INR 2.3 12/08/2015   INR 2.1 11/10/2015   He has had a "collapsed lung" of 25% back in 1966.  He was blowing up a balloon and this seemed to trigger the pneumothorax. He has had a couple of 10% pneumothoraces following this in the same year. He did not need a chest tube or any other particular treatment.  He did not have any operation.   His sx got better and he  has done fine in this regard over the years His son also had a collapsed lung- he was told this was due to his height.    Patient Active Problem List   Diagnosis Date Noted  . Preventative health care 09/22/2015  . Osteopenia 09/07/2014  . Osteoarthritis, hand 04/09/2014  . Normocytic anemia 04/09/2014  . OSA (obstructive sleep apnea) 01/08/2014  . Daytime somnolence 11/20/2013  . Renal cell carcinoma ([place]) 11/06/2013  . AAA (abdominal aortic aneurysm) without rupture ([place]) 09/22/2013  . Atrial fibrillation ([place]) 07/25/2012  . 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 BPH (benign prostatic hyperplasia) 12/21/2009  . Abdominal aortic aneurysm ([place]) 05/28/2009  . Disorder resulting from impaired renal function 05/28/2009  . Hyperlipidemia 11/11/2008  . Depression 11/11/2008  . Essential hypertension 11/11/2008  . Coronary atherosclerosis 11/11/2008  . GERD 11/11/2008  . SKIN CANCER, HX OF 11/11/2008  . NEPHROLITHIASIS, HX OF 11/11/2008    Past Medical History:  Diagnosis Date  . AAA (abdominal aortic aneurysm) (HCC)    9.1 cm repair 05/2009 ( ENDOVASCULAR REPAIR ).  MORE RECENT RE-EVALUATION OF ENLARGING AAA BY DR. Trula Slade AND BY DR. Kathlene Cote  MAY 2015 - PT STATES HE UNDERSTANDS THAT NOTHING IS LEAKING AT PRESENT TIME AND HE IS TO FOLLOW UP WITH HIS DOCTORS FOR FUTURE MONITORING I  . Arthritis    LEFT HAND, RT KNEE  . Atrial fibrillation ([place])   . CAD (coronary artery disease)   . Cancer (HCC)    BASAL CELL CA OF NOSE  . Chronic kidney disease    CHRONIC RENAL INSUFFICIENCY  . Depression   . Dysrhythmia    AF  . GERD (gastroesophageal reflux disease)   . History of nephrolithiasis   . History of pneumothorax    LONG TIME AGO  . History of skin cancer   . Hyperlipidemia   . Hypertension   . Myocardial infarction ([place]) 2006  . Renal cell carcinoma ([place])   . Shingles outbreak    PT NOTICED SKIN RASH RT GROIN AND RIGHT TRUNK ON MONDAY 10/27/13 - NOT A LOT  OF DISCOMFORT- NOT ON ANY ORAL MEDS TO TREAT.  Marland Kitchen Shortness of breath     Past Surgical History:  Procedure Laterality Date  . ABDOMINAL AORTAGRAM N/A 09/24/2013   Procedure: ABDOMINAL Maxcine Ham;  Surgeon: Serafina Mitchell, MD;  Location: [facility] CATH LAB;  Service: Cardiovascular;  Laterality: N/A;  . ABDOMINAL AORTIC ANEURYSM REPAIR  JAN 2011   ENDOVASCULAR - DR. BRABHAM  . Athroscopic knee surgery     RIGHT KNEE  . CARDIAC CATHETERIZATION  06/2004  . LITHOTRIPSY    . NEPHRECTOMY    . ROBOT ASSISTED LAPAROSCOPIC NEPHRECTOMY Left 11/06/2013   Procedure: ROBOTIC ASSISTED LAPAROSCOPIC RADICAL  NEPHRECTOMY;  Surgeon: Alexis Frock, MD;  Location: [facility] ORS;  Service: Urology;  Laterality: Left;  . TONSILLECTOMY      Social History  Substance Use Topics  . Smoking status: Former Smoker    Quit date: 08/24/1980  . Smokeless tobacco: Never Used     Comment: quit   . Alcohol use 1.5 oz/week    3 Standard drinks or equivalent per week     Comment: Occasional    Family History  Problem Relation Age of Onset  . Coronary artery disease    . Hyperlipidemia    . Hypertension    . Prostate cancer    . Pulmonary embolism Mother     died of PE  . Heart disease Mother     before age 75  . Anuerysm Father     history of popliteal  . Other Neg Hx     polycystic kidney disease, and colon cancer  . Colon cancer Neg Hx     Allergies  Allergen Reactions  . Ace Inhibitors     REACTION: Cough    Medication list has been reviewed and updated.  Current Outpatient Prescriptions on File Prior to Visit  Medication Sig Dispense Refill  . amLODipine (NORVASC) 10 MG tablet Take 1 tablet (10 mg total) by mouth every evening. 90 tablet 1  . atorvastatin (LIPITOR) 40 MG tablet TAKE 1 TABLET ONE TIME DAILY 90 tablet 1  . calcium elemental as carbonate (BARIATRIC TUMS ULTRA) 400 MG chewable tablet     . Coenzyme Q10 200 MG capsule Take 200 mg by mouth daily.    . furosemide (LASIX) 40 MG tablet Take 1  tablet (40 mg total) by mouth 2 (two) times daily. 60 tablet 0  . isosorbide dinitrate (ISORDIL) 20 MG tablet Take 1 tablet (20 mg total) by mouth 3 (three) times daily. 270 tablet 1  . losartan (COZAAR) 50 MG tablet  Take 1 tablet (50 mg total) by mouth every evening. 90 tablet 1  . metoprolol (LOPRESSOR) 100 MG tablet Take 1 tablet (100 mg total) by mouth 2 (two) times daily. 180 tablet 1  . Multiple Vitamins-Minerals (MACUVITE EYE CARE PO) Take 1 capsule by mouth daily.    . Multiple Vitamins-Minerals (SENIOR MULTIVITAMIN PLUS PO) Take 1 tablet by mouth daily.    . Omega-3 Fatty Acids (FISH OIL) 1200 MG CAPS Take 1,200 mg by mouth daily.     . ranitidine (ZANTAC) 150 MG tablet Take 1 tablet (150 mg total) by mouth 2 (two) times daily. 180 tablet 1  . sertraline (ZOLOFT) 100 MG tablet Take 1 tablet (100 mg total) by mouth every evening. 90 tablet 1  . tamsulosin (FLOMAX) 0.4 MG CAPS capsule TAKE 1 CAPSULE (0.4 MG TOTAL) DAILY AFTER BREAKFAST. 90 capsule 1  . warfarin (COUMADIN) 5 MG tablet Take 1 tablet (5 mg total) by mouth daily. 90 tablet 1   No current facility-administered medications on file prior to visit.     Review of Systems:  As per HPI- otherwise negative.   Physical Examination: Vitals:   01/05/16 1125  BP: (!) 148/92  Pulse: 79  Temp: 97.9 F (36.6 C)   Vitals:   01/05/16 1125  Weight: 242 lb 9.6 oz (110 kg)  Height: 5\' 11"  (1.803 m)   Body mass index is 33.84 kg/m. Ideal Body Weight: Weight in (lb) to have BMI = 25: 178.9  GEN: WDWN, NAD, Non-toxic, A & O x 3, obese, looks well HEENT: Atraumatic, Normocephalic. Neck supple. No masses, No LAD.  Bilateral TM wnl, oropharynx normal.  PEERL,EOMI.   Ears and Nose: No external deformity. CV: RRR, No M/G/R. No JVD. No thrill. No extra heart sounds. PULM: CTA B, no wheezes, crackles, rhonchi. No retractions. No resp. distress. No accessory muscle use. ABD: S, NT, ND. S. No rebound. No HSM.  Benign belly He has  tenderness over the left lateral ribs.  No bruise, redness or skin lesion Normal left elbow and hand/ wrist EXTR: No c/c/e NEURO Normal gait.  PSYCH: Normally interactive. Conversant. Not depressed or anxious appearing.  Calm demeanor.    BP Readings from Last 3 Encounters:  01/05/16 (!) 148/92  11/10/15 115/72  09/22/15 (!) 144/90    Sent for films downstairs  Dg Chest 2 View  Result Date: 01/05/2016 CLINICAL DATA:  Patient states that he fell on 12/29/15, has left lateral/axillary rib and chest wall pains, no other complaints, hx of AAA, CAD, atrial fib, HTN, GERD, MI EXAM: CHEST  2 VIEW COMPARISON:  12/28/2015 FINDINGS: Cardiac silhouette is mildly enlarged. No mediastinal or hilar masses or evidence of adenopathy. Lungs are mildly hyperexpanded. There is mild stable scarring at the apices with mild interstitial thickening noted in the peripheral lung bases, also stable. Lungs are otherwise clear. No pleural effusion or pneumothorax. Skeletal structures are demineralized.  No convincing fracture. IMPRESSION: No acute cardiopulmonary disease. Electronically Signed   By: Lajean Manes M.D.   On: 01/05/2016 12:20   Dg Chest 2 View  Result Date: 12/28/2015 CLINICAL DATA:  History of renal cell carcinoma. EXAM: CHEST  2 VIEW COMPARISON:  06/04/2015, 11/05/2014. FINDINGS: Mediastinum hilar structures normal. Cardiomegaly with normal pulmonary vascularity. Stable nipple shadow right chest. Stable mild chronic interstitial prominence. No focal infiltrate. No pleural effusion or pneumothorax. No acute bony abnormality identified. IMPRESSION: 1.  Stable cardiomegaly.  No pulmonary venous congestion. 2. Stable chronic interstitial changes.  No acute  pulmonary disease. Electronically Signed   By: Marcello Moores  Register   On: 12/28/2015 10:26   Dg Ribs Unilateral Left  Result Date: 01/05/2016 CLINICAL DATA:  Patient states that he fell on 12/29/15, has left lateral/axillary rib and chest wall pains, no other  complaints, hx of AAA, CAD, atrial fib, HTN, GERD, MI EXAM: LEFT RIBS - 2 VIEW COMPARISON:  None. FINDINGS: There is an acute fracture of the anterior lateral left sixth rib with no significant displacement. No other convincing fractures.  No bone lesion. IMPRESSION: Fracture of the left anterior, lateral sixth rib. Electronically Signed   By: Lajean Manes M.D.   On: 01/05/2016 12:23   Assessment and Plan: Rib pain on left side - Plan: DG Chest 2 View, DG Ribs Unilateral Left  Pt with history of pneumothorax with recent fall with left side trauma. He is concerned that he could have another pneumo. Received films as above- called pt but we got disconnected.  Called again and [facility].  No pneumothorax, but he does have a non- displaced fracture of the left sixth rib.  This certainly would explain his symptoms.  Please treat pain with tylenol as needed and let me know if not improving soon  Please go downstairs to the imaging department for films of your chest and ribs. Then you [name] go home- we will look for any sign of a pneumothorax (collapsed lung) on your films and I will let you know what they show.  Assuming they look ok you are probably just bruised and sore, but please let me know if you have any change or worsening of your symptoms.  Your mild shortness of breath over the last year or so does not sound ominous but please be sure to mention this to your cardiologist in the next couple of months; you may need to schedule an appt to see them   Signed Lamar Blinks, MD

## 2016-01-05 NOTE — Progress Notes (Signed)
Pre visit review using our clinic review tool, if applicable. No additional management support is needed unless otherwise documented below in the visit note. 

## 2016-01-05 NOTE — Telephone Encounter (Signed)
°  Relation to [id number] Call back number:[phone] Pharmacy:  Reason for call:  Patient states he received a missed call, inquiring about imaging results. Please advise

## 2016-01-05 NOTE — Patient Instructions (Addendum)
Please go downstairs to the imaging department for films of your chest and ribs. Then you can go home- we will look for any sign of a pneumothorax (collapsed lung) on your films and I will let you know what they show.  Assuming they look ok you are probably just bruised and sore, but please let me know if you have any change or worsening of your symptoms.  Your mild shortness of breath over the last year or so does not sound ominous but please be sure to mention this to your cardiologist in the next couple of months; you may need to schedule an appt to see them

## 2016-01-11 NOTE — Telephone Encounter (Signed)
  Entered by Darreld Mclean, MD at 01/05/2016 1:09 PM  Read by Ellis Savage at 01/07/2016 10:08 AM  Left rib fracture! This is likely the cause of your pain. This will generally heal without incident    Entered by Darreld Mclean, MD at 01/05/2016 1:08 PM  Read by Ellis Savage at 01/05/2016 1:34 PM  No acute issue on your chest x-ray. No sign of a collapsed lung

## 2016-01-15 ENCOUNTER — Other Ambulatory Visit: Payer: Self-pay | Admitting: Family

## 2016-01-26 ENCOUNTER — Ambulatory Visit (INDEPENDENT_AMBULATORY_CARE_PROVIDER_SITE_OTHER): Payer: Commercial Managed Care - HMO | Admitting: Family Medicine

## 2016-01-26 ENCOUNTER — Telehealth: Payer: Self-pay | Admitting: Family Medicine

## 2016-01-26 ENCOUNTER — Encounter: Payer: Self-pay | Admitting: Family Medicine

## 2016-01-26 ENCOUNTER — Ambulatory Visit: Payer: Commercial Managed Care - HMO

## 2016-01-26 VITALS — BP 122/86 | HR 82 | Temp 98.2°F | Resp 16 | Ht 71.0 in | Wt 240.0 lb

## 2016-01-26 DIAGNOSIS — R0609 Other forms of dyspnea: Secondary | ICD-10-CM | POA: Diagnosis not present

## 2016-01-26 DIAGNOSIS — I4891 Unspecified atrial fibrillation: Secondary | ICD-10-CM | POA: Diagnosis not present

## 2016-01-26 LAB — POCT INR: INR: 2

## 2016-01-26 MED ORDER — ALBUTEROL SULFATE HFA 108 (90 BASE) MCG/ACT IN AERS: 2.0000 | INHALATION_SPRAY | Freq: Four times a day (QID) | RESPIRATORY_TRACT | 0 refills | Status: DC | PRN

## 2016-01-26 MED ORDER — ALBUTEROL SULFATE HFA 108 (90 BASE) MCG/ACT IN AERS: 2.0000 | INHALATION_SPRAY | Freq: Four times a day (QID) | RESPIRATORY_TRACT | 0 refills | Status: AC | PRN

## 2016-01-26 NOTE — Patient Instructions (Signed)
Hold the inhaler 1-2 inches from your mouth when using it. If you know you are going to be active, use it 10-15 minutes prior to anticipated activity.

## 2016-01-26 NOTE — Progress Notes (Signed)
Pre visit review using our clinic review tool, if applicable. No additional management support is needed unless otherwise documented below in the visit note. 

## 2016-01-26 NOTE — Telephone Encounter (Signed)
Called and spoke with the pt and informed him of the above note.  Pt verbalized understanding and agreed.//AB/CMA

## 2016-01-26 NOTE — Progress Notes (Signed)
Chief Complaint  Patient presents with  . Acute Visit    patient c/o sob with exertion for many years bu it seems to be getting worst.     Subjective: Patient is a 75 y.o. male here for SOB.  Progressively worsening SOB with exertion. This is been going on for several years. Normal echo in 10/2015. He has never had pulmonary function tests. No chest pain, cough, fevers, significant weight gain, or wheezing. He does not have any history of asthma, COPD, or current smoking.  ROS: Heart: Denies chest pain or palpitations Lungs: +DOE, no cough  Family History  Problem Relation Age of Onset  . Coronary artery disease    . Hyperlipidemia    . Hypertension    . Prostate cancer    . Pulmonary embolism Mother     died of PE  . Heart disease Mother     before age 75  . Anuerysm Father     history of popliteal  . Other Neg Hx     polycystic kidney disease, and colon cancer  . Colon cancer Neg Hx    Past Medical History:  Diagnosis Date  . AAA (abdominal aortic aneurysm) (HCC)    9.1 cm repair 05/2009 ( ENDOVASCULAR REPAIR ).  MORE RECENT RE-EVALUATION OF ENLARGING AAA BY DR. Trula Slade AND BY DR. Kathlene Cote  MAY 2015 - PT STATES HE UNDERSTANDS THAT NOTHING IS LEAKING AT PRESENT TIME AND HE IS TO FOLLOW UP WITH HIS DOCTORS FOR FUTURE MONITORING I  . Arthritis    LEFT HAND, RT KNEE  . Atrial fibrillation ([place])   . CAD (coronary artery disease)   . Cancer (HCC)    BASAL CELL CA OF NOSE  . Chronic kidney disease    CHRONIC RENAL INSUFFICIENCY  . Depression   . Dysrhythmia    AF  . GERD (gastroesophageal reflux disease)   . History of nephrolithiasis   . History of pneumothorax    LONG TIME AGO  . History of skin cancer   . Hyperlipidemia   . Hypertension   . Myocardial infarction ([place]) 2006  . Renal cell carcinoma ([place])   . Shingles outbreak    PT NOTICED SKIN RASH RT GROIN AND RIGHT TRUNK ON MONDAY 10/27/13 - NOT A LOT OF DISCOMFORT- NOT ON ANY ORAL MEDS TO TREAT.  Marland Kitchen Shortness of  breath    Allergies  Allergen Reactions  . Ace Inhibitors     REACTION: Cough    Current Outpatient Prescriptions:  .  amLODipine (NORVASC) 10 MG tablet, Take 1 tablet (10 mg total) by mouth every evening., Disp: 90 tablet, Rfl: 1 .  atorvastatin (LIPITOR) 40 MG tablet, TAKE 1 TABLET ONE TIME DAILY, Disp: 90 tablet, Rfl: 1 .  calcium elemental as carbonate (BARIATRIC TUMS ULTRA) 400 MG chewable tablet, , Disp: , Rfl:  .  Coenzyme Q10 200 MG capsule, Take 200 mg by mouth daily., Disp: , Rfl:  .  furosemide (LASIX) 40 MG tablet, Take 1 tablet (40 mg total) by mouth 2 (two) times daily., Disp: 60 tablet, Rfl: 0 .  isosorbide dinitrate (ISORDIL) 20 MG tablet, Take 1 tablet (20 mg total) by mouth 3 (three) times daily., Disp: 270 tablet, Rfl: 1 .  losartan (COZAAR) 50 MG tablet, TAKE 1 TABLET EVERY EVENING, Disp: 90 tablet, Rfl: 1 .  metoprolol (LOPRESSOR) 100 MG tablet, Take 1 tablet (100 mg total) by mouth 2 (two) times daily., Disp: 180 tablet, Rfl: 1 .  Multiple Vitamins-Minerals (MACUVITE EYE  CARE PO), Take 1 capsule by mouth daily., Disp: , Rfl:  .  Multiple Vitamins-Minerals (SENIOR MULTIVITAMIN PLUS PO), Take 1 tablet by mouth daily., Disp: , Rfl:  .  Omega-3 Fatty Acids (FISH OIL) 1200 MG CAPS, Take 1,200 mg by mouth daily. , Disp: , Rfl:  .  ranitidine (ZANTAC) 150 MG tablet, Take 1 tablet (150 mg total) by mouth 2 (two) times daily., Disp: 180 tablet, Rfl: 1 .  sertraline (ZOLOFT) 100 MG tablet, Take 1 tablet (100 mg total) by mouth every evening., Disp: 90 tablet, Rfl: 1 .  tamsulosin (FLOMAX) 0.4 MG CAPS capsule, TAKE 1 CAPSULE (0.4 MG TOTAL) DAILY AFTER BREAKFAST., Disp: 90 capsule, Rfl: 1 .  warfarin (COUMADIN) 5 MG tablet, TAKE 1 TABLET EVERY DAY (Patient taking differently: TAKE 1 TABLET EVERY DAY EXCEPT 1.5 TABLETS ON WEDNESDAY.), Disp: 90 tablet, Rfl: 1 .  albuterol (PROVENTIL HFA;VENTOLIN HFA) 108 (90 Base) MCG/ACT inhaler, Inhale 2 puffs into the lungs every 6 (six) hours as  needed for wheezing or shortness of breath., Disp: 1 Inhaler, Rfl: 0  Objective: BP 122/86 (BP Location: Left Arm, Patient Position: Sitting, Cuff Size: Normal)   Pulse 82   Temp 98.2 F (36.8 C) (Oral)   Resp 16   Ht 5\' 11"  (1.803 m)   Wt 240 lb (108.9 kg)   SpO2 95%   BMI 33.47 kg/m  General: Awake, appears stated age HEENT: MMM, no erythema or exudate, EOMi, nares patent, no D/C Neck: No masses or asymmetry, no thyromegaly Heart: Reg rate, irreg irreg rhythm, no murmurs, No LE edema, no JVD Lungs: CTAB, no rales, wheezes or rhonchi. Normal effort without accessory muscle use. MSK: gait normal, no atrophy or asymmetry Psych: Age appropriate judgment and insight, normal affect and mood  Assessment and Plan: Dyspnea on exertion - Plan: albuterol (PROVENTIL HFA;VENTOLIN HFA) 108 (90 Base) MCG/ACT inhaler, Pulmonary function test, DISCONTINUED: albuterol (PROVENTIL HFA;VENTOLIN HFA) 108 (90 Base) MCG/ACT inhaler  Atrial fibrillation, unspecified type ([place]) - Plan: POCT INR  Orders as above. Trial inhaler, will definitely get PFTs. I do not there there is any CHF vs CHF exacerbation component going on. His lungs sound clear today and VSS. CBC in 11/2015 showed no anemia. INR on lower end of normal. Should recheck in 1-2 weeks. F/u in 4 weeks with POR, later if PFT's not done by then. The patient voiced understanding and agreement to the plan.  [place], DO 01/26/16  12:00 PM

## 2016-01-26 NOTE — Progress Notes (Signed)
Pt here in the clinic today stating he received a call for him to come in today to have PT/INR checked.  Reviewed patient's chart.  Pt is actually due for PT/INR next week.  Since patient arrived via bus. PT/INR checked.  Results: 2.0.  GOAL: 2.0 - 3.0.  Results entered in chart.    While patient is here.  He also has complaints of shortness of breath and is requesting to be seen.  Pt placed on Dr. Irene Limbo schedule for evaluation.    Dr. Nani Ravens has agreed to address patient's PT/INR today.

## 2016-01-26 NOTE — Telephone Encounter (Signed)
Please make sure pt knows to return in next 1-2 weeks to recheck INR and make sure he is still therapeutic. No changes to be made at this time. Thanks.

## 2016-02-01 DIAGNOSIS — N183 Chronic kidney disease, stage 3 (moderate): Secondary | ICD-10-CM | POA: Diagnosis not present

## 2016-02-01 DIAGNOSIS — D631 Anemia in chronic kidney disease: Secondary | ICD-10-CM | POA: Diagnosis not present

## 2016-02-01 DIAGNOSIS — N189 Chronic kidney disease, unspecified: Secondary | ICD-10-CM | POA: Diagnosis not present

## 2016-02-01 DIAGNOSIS — I129 Hypertensive chronic kidney disease with stage 1 through stage 4 chronic kidney disease, or unspecified chronic kidney disease: Secondary | ICD-10-CM | POA: Diagnosis not present

## 2016-02-01 DIAGNOSIS — N2581 Secondary hyperparathyroidism of renal origin: Secondary | ICD-10-CM | POA: Diagnosis not present

## 2016-02-01 DIAGNOSIS — Z6834 Body mass index (BMI) 34.0-34.9, adult: Secondary | ICD-10-CM | POA: Diagnosis not present

## 2016-02-02 ENCOUNTER — Ambulatory Visit: Payer: Commercial Managed Care - HMO

## 2016-03-01 NOTE — Telephone Encounter (Signed)
Called pt and left message to return call to schedule INR check as soon as possible.

## 2016-03-08 ENCOUNTER — Ambulatory Visit (INDEPENDENT_AMBULATORY_CARE_PROVIDER_SITE_OTHER): Payer: Commercial Managed Care - HMO | Admitting: Internal Medicine

## 2016-03-08 DIAGNOSIS — R0609 Other forms of dyspnea: Secondary | ICD-10-CM

## 2016-03-08 LAB — PULMONARY FUNCTION TEST
DL/VA % PRED: 37 %
DL/VA: 1.76 ml/min/mmHg/L
DLCO UNC % PRED: 40 %
DLCO cor % pred: 39 %
DLCO cor: 13.2 ml/min/mmHg
DLCO unc: 13.59 ml/min/mmHg
FEF 25-75 PRE: 1.42 L/s
FEF 25-75 Post: 1.99 L/sec
FEF2575-%CHANGE-POST: 40 %
FEF2575-%Pred-Post: 82 %
FEF2575-%Pred-Pre: 58 %
FEV1-%CHANGE-POST: 16 %
FEV1-%PRED-PRE: 87 %
FEV1-%Pred-Post: 102 %
FEV1-PRE: 2.87 L
FEV1-Post: 3.35 L
FEV1FVC-%CHANGE-POST: 11 %
FEV1FVC-%Pred-Pre: 80 %
FEV6-%CHANGE-POST: 5 %
FEV6-%PRED-POST: 115 %
FEV6-%PRED-PRE: 110 %
FEV6-PRE: 4.65 L
FEV6-Post: 4.9 L
FEV6FVC-%CHANGE-POST: 0 %
FEV6FVC-%PRED-PRE: 102 %
FEV6FVC-%Pred-Post: 102 %
FVC-%CHANGE-POST: 4 %
FVC-%PRED-POST: 113 %
FVC-%Pred-Pre: 108 %
FVC-Post: 5.09 L
FVC-Pre: 4.86 L
POST FEV6/FVC RATIO: 96 %
PRE FEV6/FVC RATIO: 97 %
Post FEV1/FVC ratio: 66 %
Pre FEV1/FVC ratio: 59 %
RV % PRED: 87 %
RV: 2.24 L
TLC % pred: 105 %
TLC: 7.6 L

## 2016-03-24 ENCOUNTER — Ambulatory Visit (INDEPENDENT_AMBULATORY_CARE_PROVIDER_SITE_OTHER): Payer: Commercial Managed Care - HMO | Admitting: Family

## 2016-03-24 ENCOUNTER — Telehealth: Payer: Self-pay | Admitting: Family

## 2016-03-24 ENCOUNTER — Encounter: Payer: Self-pay | Admitting: Family

## 2016-03-24 VITALS — BP 140/92 | HR 91 | Temp 97.1°F | Resp 16 | Ht 71.0 in | Wt 240.8 lb

## 2016-03-24 DIAGNOSIS — J45909 Unspecified asthma, uncomplicated: Secondary | ICD-10-CM | POA: Insufficient documentation

## 2016-03-24 DIAGNOSIS — E785 Hyperlipidemia, unspecified: Secondary | ICD-10-CM

## 2016-03-24 DIAGNOSIS — Z23 Encounter for immunization: Secondary | ICD-10-CM

## 2016-03-24 DIAGNOSIS — I10 Essential (primary) hypertension: Secondary | ICD-10-CM | POA: Diagnosis not present

## 2016-03-24 DIAGNOSIS — I4891 Unspecified atrial fibrillation: Secondary | ICD-10-CM

## 2016-03-24 LAB — BASIC METABOLIC PANEL
BUN: 26 mg/dL — AB (ref 6–23)
CALCIUM: 8.9 mg/dL (ref 8.4–10.5)
CO2: 24 mEq/L (ref 19–32)
CREATININE: 1.83 mg/dL — AB (ref 0.40–1.50)
Chloride: 107 mEq/L (ref 96–112)
GFR: 38.77 mL/min — AB (ref >60.00)
GLUCOSE: 93 mg/dL (ref 70–99)
Potassium: 3.7 mEq/L (ref 3.5–5.1)
SODIUM: 142 meq/L (ref 135–145)

## 2016-03-24 LAB — LIPID PANEL
Cholesterol: 136 mg/dL (ref 0–200)
HDL: 46.4 mg/dL (ref >39.00)
LDL Cholesterol: 80 mg/dL (ref 0–99)
NONHDL: 89.63
Total CHOL/HDL Ratio: 3
Triglycerides: 48 mg/dL (ref 0.0–149.0)
VLDL: 9.6 mg/dL (ref 0.0–40.0)

## 2016-03-24 LAB — PROTIME-INR
INR: 2 ratio — ABNORMAL HIGH (ref 0.8–1.0)
Prothrombin Time: 20.9 s — ABNORMAL HIGH (ref 9.6–13.1)

## 2016-03-24 NOTE — Assessment & Plan Note (Signed)
Tolerating statin, obtain follow-up lipid panel. 

## 2016-03-24 NOTE — Assessment & Plan Note (Signed)
Clinically stable. Continues coumadin. I emphasized to patient importance of compliance with PT/INR checks.  Pt verbalizes understanding.

## 2016-03-24 NOTE — Assessment & Plan Note (Signed)
BP acceptable for his age. Continue current medications.

## 2016-03-24 NOTE — Telephone Encounter (Signed)
Per pt's avs he is to schedule to come back in to have cpe in 4 weeks. Pt has had his cpe this year. Pt would like to know if he still need to come back in in 4 weeks for a f/u, if not when should pt be scheduled to follow up?

## 2016-03-24 NOTE — Patient Instructions (Signed)
Please complete lab work prior to leaving.   

## 2016-03-24 NOTE — Progress Notes (Signed)
Subjective:    Patient ID: Mark [name], male    DOB: February 11, 1941, 75 y.o.   MRN: [id number]  HPI  Mr. Mark [name] is a 75 yr old male who presents today for follow up.  1) HTN- on metoprolol, losaratan, isosorbid, lasix.   BP Readings from Last 3 Encounters:  03/24/16 (!) 140/92  01/26/16 122/86  01/05/16 (!) 148/92   2) Hyperlipidemia- continues atorvastain. Denies myalgia Lab Results  Component Value Date   CHOL 126 06/01/2015   HDL 37.40 (L) 06/01/2015   LDLCALC 69 06/01/2015   TRIG 98.0 06/01/2015   CHOLHDL 3 06/01/2015   3) Depression- reports mood is good. Has been having a lot of dreams lately, but not nightmares.   DOE- had PFT's performed last visit.  He reports that he felt better after use of      Review of Systems  See HPI  Past Medical History:  Diagnosis Date  . AAA (abdominal aortic aneurysm) (HCC)    9.1 cm repair 05/2009 ( ENDOVASCULAR REPAIR ).  MORE RECENT RE-EVALUATION OF ENLARGING AAA BY DR. Trula [name] AND BY DR. Kathlene [name]  MAY 2015 - PT STATES HE UNDERSTANDS THAT NOTHING IS LEAKING AT PRESENT TIME AND HE IS TO FOLLOW UP WITH HIS DOCTORS FOR FUTURE MONITORING I  . Arthritis    LEFT HAND, RT KNEE  . Atrial fibrillation ([place])   . CAD (coronary artery disease)   . Cancer (HCC)    BASAL CELL CA OF NOSE  . Chronic kidney disease    CHRONIC RENAL INSUFFICIENCY  . Depression   . Dysrhythmia    AF  . GERD (gastroesophageal reflux disease)   . History of nephrolithiasis   . History of pneumothorax    LONG TIME AGO  . History of skin cancer   . Hyperlipidemia   . Hypertension   . Myocardial infarction 2006  . Renal cell carcinoma ([place])   . Shingles outbreak    PT NOTICED SKIN RASH RT GROIN AND RIGHT TRUNK ON MONDAY 10/27/13 - NOT A LOT OF DISCOMFORT- NOT ON ANY ORAL MEDS TO TREAT.  Marland Kitchen Shortness of breath      Social History   Social History  . Marital status: Divorced    Spouse name: N/A  . Number of children: 2  . Years of education: N/A    Occupational History  .  Retired   Social History Main Topics  . Smoking status: Former Smoker    Quit date: 08/24/1980  . Smokeless tobacco: Never Used     Comment: quit   . Alcohol use 1.5 oz/week    3 Standard drinks or equivalent per week     Comment: Occasional  . Drug use: No  . Sexual activity: Not on file   Other Topics Concern  . Not on file   Social History Narrative   Retired   Divorced   2 sons 9 &30     Occupation:  Part time sports broadcaster    Smoking Status:  quit (08/24/1980)   Packs/Day:  1.0    Caffeine use/day:  3 beverages daily    Does Patient Exercise:  no   Alcohol Use - yes    Past Surgical History:  Procedure Laterality Date  . ABDOMINAL AORTAGRAM N/A 09/24/2013   Procedure: ABDOMINAL Maxcine Ham;  Surgeon: Mark Mitchell, MD;  Location: [facility] CATH LAB;  Service: Cardiovascular;  Laterality: N/A;  . ABDOMINAL AORTIC ANEURYSM REPAIR  JAN 2011   ENDOVASCULAR - DR. BRABHAM  .  Athroscopic knee surgery     RIGHT KNEE  . CARDIAC CATHETERIZATION  06/2004  . LITHOTRIPSY    . NEPHRECTOMY    . ROBOT ASSISTED LAPAROSCOPIC NEPHRECTOMY Left 11/06/2013   Procedure: ROBOTIC ASSISTED LAPAROSCOPIC RADICAL  NEPHRECTOMY;  Surgeon: Mark Frock, MD;  Location: [facility] ORS;  Service: Urology;  Laterality: Left;  . TONSILLECTOMY      Family History  Problem Relation Age of Onset  . Coronary artery disease    . Hyperlipidemia    . Hypertension    . Prostate cancer    . Pulmonary embolism Mother     died of PE  . Heart disease Mother     before age 53  . Anuerysm Father     history of popliteal  . Other Neg Hx     polycystic kidney disease, and colon cancer  . Colon cancer Neg Hx     Allergies  Allergen Reactions  . Ace Inhibitors     REACTION: Cough    Current Outpatient Prescriptions on File Prior to Visit  Medication Sig Dispense Refill  . amLODipine (NORVASC) 10 MG tablet Take 1 tablet (10 mg total) by mouth every evening. 90 tablet 1  .  atorvastatin (LIPITOR) 40 MG tablet TAKE 1 TABLET ONE TIME DAILY 90 tablet 1  . calcium elemental as carbonate (BARIATRIC TUMS ULTRA) 400 MG chewable tablet     . Coenzyme Q10 200 MG capsule Take 200 mg by mouth daily.    . furosemide (LASIX) 40 MG tablet Take 1 tablet (40 mg total) by mouth 2 (two) times daily. 60 tablet 0  . isosorbide dinitrate (ISORDIL) 20 MG tablet Take 1 tablet (20 mg total) by mouth 3 (three) times daily. 270 tablet 1  . losartan (COZAAR) 50 MG tablet TAKE 1 TABLET EVERY EVENING 90 tablet 1  . metoprolol (LOPRESSOR) 100 MG tablet Take 1 tablet (100 mg total) by mouth 2 (two) times daily. 180 tablet 1  . Multiple Vitamins-Minerals (MACUVITE EYE CARE PO) Take 1 capsule by mouth daily.    . Omega-3 Fatty Acids (FISH OIL) 1200 MG CAPS Take 1,200 mg by mouth daily.     . ranitidine (ZANTAC) 150 MG tablet Take 1 tablet (150 mg total) by mouth 2 (two) times daily. 180 tablet 1  . sertraline (ZOLOFT) 100 MG tablet Take 1 tablet (100 mg total) by mouth every evening. 90 tablet 1  . tamsulosin (FLOMAX) 0.4 MG CAPS capsule TAKE 1 CAPSULE (0.4 MG TOTAL) DAILY AFTER BREAKFAST. 90 capsule 1  . warfarin (COUMADIN) 5 MG tablet TAKE 1 TABLET EVERY DAY (Patient taking differently: TAKE 1 TABLET EVERY DAY EXCEPT 1.5 TABLETS ON WEDNESDAY.) 90 tablet 1  . albuterol (PROVENTIL HFA;VENTOLIN HFA) 108 (90 Base) MCG/ACT inhaler Inhale 2 puffs into the lungs every 6 (six) hours as needed for wheezing or shortness of breath. (Patient not taking: Reported on 03/24/2016) 1 Inhaler 0   No current facility-administered medications on file prior to visit.     BP (!) 140/92 (BP Location: Right Arm, Cuff Size: Large)   Pulse 91   Temp 97.1 F (36.2 C) (Oral)   Resp 16   Ht 5\' 11"  (1.803 m)   Wt 240 lb 12.8 oz (109.2 kg)   SpO2 95% Comment: room air  BMI 33.58 kg/m        Objective:   Physical Exam  Constitutional: He is oriented to person, place, and time. He appears well-developed and  well-nourished. No distress.  HENT:  Head: Normocephalic and atraumatic.  Cardiovascular: Normal rate and regular rhythm.   No murmur heard. Pulmonary/Chest: Effort normal and breath sounds normal. No respiratory distress. He has no wheezes. He has no rales.  Musculoskeletal: He exhibits no edema.  Neurological: He is alert and oriented to person, place, and time.  Skin: Skin is warm and dry.  Psychiatric: He has a normal mood and affect. His behavior is normal. Thought content normal.          Assessment & Plan:

## 2016-03-24 NOTE — Assessment & Plan Note (Signed)
Stable, monitor.  °

## 2016-03-24 NOTE — Assessment & Plan Note (Signed)
Reviewed preliminary results of PFT's. There is a 16 % improvement in FEV1 post bronchodilator. I advised pt to take albuterol prior to exercise.  Pt verbalizes understanding.

## 2016-03-24 NOTE — Telephone Encounter (Signed)
Lets make that a medicare wellness with one of the RN's.

## 2016-03-24 NOTE — Progress Notes (Signed)
Pre visit review using our clinic review tool, if applicable. No additional management support is needed unless otherwise documented below in the visit note. 

## 2016-03-27 ENCOUNTER — Encounter: Payer: Self-pay | Admitting: *Deleted

## 2016-03-28 NOTE — Telephone Encounter (Signed)
lvm for pt to call us back to schedule wellness visit.

## 2016-04-06 ENCOUNTER — Other Ambulatory Visit: Payer: Self-pay | Admitting: Family

## 2016-04-06 MED ORDER — FUROSEMIDE 40 MG PO TABS: 40.0000 mg | ORAL_TABLET | Freq: Two times a day (BID) | ORAL | 1 refills | Status: DC

## 2016-04-06 MED ORDER — AMLODIPINE BESYLATE 10 MG PO TABS: 10.0000 mg | ORAL_TABLET | Freq: Every evening | ORAL | 1 refills | Status: DC

## 2016-04-06 MED ORDER — RANITIDINE HCL 150 MG PO TABS: 150.0000 mg | ORAL_TABLET | Freq: Two times a day (BID) | ORAL | 1 refills | Status: AC

## 2016-04-06 MED ORDER — ATORVASTATIN CALCIUM 40 MG PO TABS: ORAL_TABLET | ORAL | 1 refills | Status: AC

## 2016-04-06 MED ORDER — ISOSORBIDE DINITRATE 20 MG PO TABS: 20.0000 mg | ORAL_TABLET | Freq: Three times a day (TID) | ORAL | 1 refills | Status: DC

## 2016-04-06 MED ORDER — SERTRALINE HCL 100 MG PO TABS: 100.0000 mg | ORAL_TABLET | Freq: Every evening | ORAL | 1 refills | Status: AC

## 2016-04-06 MED ORDER — METOPROLOL TARTRATE 100 MG PO TABS: 100.0000 mg | ORAL_TABLET | Freq: Two times a day (BID) | ORAL | 1 refills | Status: DC

## 2016-04-06 NOTE — Telephone Encounter (Signed)
°  Relationship to patient: Self Can be reached: [phone]  Pharmacy:  [facility], [place] [phone] ([phone] (Fax)     Reason for call: Refills on furosemide (LASIX) 40 MG tablet [id number]  isosorbide dinitrate (ISORDIL) 20 MG tablet [id number] metoprolol (LOPRESSOR) 100 MG tablet [id number]  sertraline (ZOLOFT) 100 MG tablet [id number] ranitidine (ZANTAC) 150 MG tablet [id number] amLODipine (NORVASC) 10 MG tablet [id number] atorvastatin (LIPITOR) 40 MG tablet [id number]

## 2016-04-06 NOTE — Telephone Encounter (Signed)
Medication filled to pharmacy as requested.   

## 2016-04-11 NOTE — Telephone Encounter (Signed)
Notified pt of lab results. Scheduled nurse visit INR check for 04/25/16 at 3pm.  Pt also states that his shortness of breath is getting worse. Denies edema or chest pain. States he was supposed to be using inhaler on a regular basis and has not. He states he will try to be consistent with inhaler use and will let us know if he doesn't improve.

## 2016-04-25 ENCOUNTER — Ambulatory Visit: Payer: Commercial Managed Care - HMO

## 2016-06-02 ENCOUNTER — Telehealth: Payer: Self-pay | Admitting: Family

## 2016-06-02 MED ORDER — LOSARTAN POTASSIUM 50 MG PO TABS: 50.0000 mg | ORAL_TABLET | Freq: Every evening | ORAL | 0 refills | Status: DC

## 2016-06-02 MED ORDER — TAMSULOSIN HCL 0.4 MG PO CAPS: ORAL_CAPSULE | ORAL | 0 refills | Status: DC

## 2016-06-02 MED ORDER — WARFARIN SODIUM 5 MG PO TABS: ORAL_TABLET | ORAL | 1 refills | Status: DC

## 2016-06-02 NOTE — Telephone Encounter (Signed)
Caller name: Relationship to patient: Self  Can be reached: [phone]  Pharmacy:  [facility], [place] [phone] (Phone) [phone] (Fax)   Reason for call: Request refill on tamsulosin (FLOMAX) 0.4 MG CAPS capsule  warfarin (COUMADIN) 5 MG tablet  losartan (COZAAR) 50 MG tablet

## 2016-06-02 NOTE — Telephone Encounter (Signed)
Tamsulosin Last filled: 10/06/15 Amt: 90,1  Warfarin  Last filled: 01/17/16 Amt: 90,1  Losartan  Last filled: 01/17/16 Amt: 90,1  Last OV:  03/24/16  Rx sent to pharmacy.

## 2016-06-02 NOTE — Telephone Encounter (Signed)
Please call patient again to schedule AWV with RN.

## 2016-06-07 NOTE — Telephone Encounter (Signed)
Called pt. lvm for him to call in to schedule wellness visit with RN.

## 2016-07-28 DIAGNOSIS — N183 Chronic kidney disease, stage 3 (moderate): Secondary | ICD-10-CM | POA: Diagnosis not present

## 2016-07-28 DIAGNOSIS — N2581 Secondary hyperparathyroidism of renal origin: Secondary | ICD-10-CM | POA: Diagnosis not present

## 2016-08-01 DIAGNOSIS — N2581 Secondary hyperparathyroidism of renal origin: Secondary | ICD-10-CM | POA: Diagnosis not present

## 2016-08-01 DIAGNOSIS — I129 Hypertensive chronic kidney disease with stage 1 through stage 4 chronic kidney disease, or unspecified chronic kidney disease: Secondary | ICD-10-CM | POA: Diagnosis not present

## 2016-08-01 DIAGNOSIS — N183 Chronic kidney disease, stage 3 (moderate): Secondary | ICD-10-CM | POA: Diagnosis not present

## 2016-08-01 DIAGNOSIS — D631 Anemia in chronic kidney disease: Secondary | ICD-10-CM | POA: Diagnosis not present

## 2016-08-01 DIAGNOSIS — E669 Obesity, unspecified: Secondary | ICD-10-CM | POA: Diagnosis not present

## 2016-08-08 ENCOUNTER — Telehealth: Payer: Self-pay | Admitting: Family

## 2016-08-08 NOTE — Telephone Encounter (Signed)
Called patient to schedule awv. Lvm for patient to call office to schedule appt.  °

## 2016-08-09 ENCOUNTER — Ambulatory Visit (INDEPENDENT_AMBULATORY_CARE_PROVIDER_SITE_OTHER): Payer: Commercial Managed Care - HMO | Admitting: Behavioral Health

## 2016-08-09 DIAGNOSIS — I4891 Unspecified atrial fibrillation: Secondary | ICD-10-CM | POA: Diagnosis not present

## 2016-08-09 LAB — POCT INR: INR: 2.6

## 2016-08-09 NOTE — Patient Instructions (Signed)
Per Dr. Nani Ravens: Continue taking Coumadin 5 mg daily, except 7.5 mg on Wednesdays. Return in 4 weeks for INR check.

## 2016-08-09 NOTE — Progress Notes (Signed)
Pre visit review using our clinic review tool, if applicable. No additional management support is needed unless otherwise documented below in the visit note.  Patient came in office for INR check. Verified current medication regimen with the patient. He reported no changes or positive findings. INR reading was 2.6.   Per Dr. Nani Ravens: Continue taking Coumadin 5 mg daily, except 7.5 mg on Wednesdays. Return in 4 weeks for INR check.  Also, during the visit, the patient voiced that he recently read up on the signs/symptoms for stroke and believes that here lately he's had some of those symptoms. He stated that he's been having shortness of breath and vivid dreams for several months now. Patient denies chest pain, palpitations, dizziness, lightheadedness, headaches, numbness/tingling in limbs, slurred speech & facial drooping. Patient has already scheduled an appointment with his PCP on 08/14/16 regarding the above concerns. RN informed Dr. Nani Ravens of the patient's symptoms.  Dr. Nani Ravens advised for the patient to keep appointment for this upcoming Monday, but if his shortness of breath worsens or he notices any other alarming symptoms mentioned above, seek the emergency department.  Patient was made aware of the provider's recommendations and verbalized understanding.

## 2016-08-14 ENCOUNTER — Observation Stay (HOSPITAL_COMMUNITY): Payer: Medicare HMO

## 2016-08-14 ENCOUNTER — Emergency Department (HOSPITAL_BASED_OUTPATIENT_CLINIC_OR_DEPARTMENT_OTHER): Payer: Medicare HMO

## 2016-08-14 ENCOUNTER — Ambulatory Visit (INDEPENDENT_AMBULATORY_CARE_PROVIDER_SITE_OTHER): Payer: Medicare HMO | Admitting: Family

## 2016-08-14 ENCOUNTER — Encounter (HOSPITAL_BASED_OUTPATIENT_CLINIC_OR_DEPARTMENT_OTHER): Payer: Self-pay | Admitting: Respiratory Therapy

## 2016-08-14 ENCOUNTER — Inpatient Hospital Stay (HOSPITAL_BASED_OUTPATIENT_CLINIC_OR_DEPARTMENT_OTHER)
Admission: EM | Admit: 2016-08-14 | Discharge: 2016-08-22 | DRG: 291 | Disposition: A | Discharge status: Home / Self Care | Payer: Medicare HMO | Attending: Internal Medicine | Admitting: Internal Medicine

## 2016-08-14 ENCOUNTER — Encounter: Payer: Self-pay | Admitting: Family

## 2016-08-14 VITALS — BP 147/80 | HR 97 | Temp 98.0°F | Resp 16 | Ht 71.0 in | Wt 222.0 lb

## 2016-08-14 DIAGNOSIS — I4891 Unspecified atrial fibrillation: Secondary | ICD-10-CM | POA: Diagnosis not present

## 2016-08-14 DIAGNOSIS — Z8249 Family history of ischemic heart disease and other diseases of the circulatory system: Secondary | ICD-10-CM | POA: Diagnosis not present

## 2016-08-14 DIAGNOSIS — C641 Malignant neoplasm of right kidney, except renal pelvis: Secondary | ICD-10-CM

## 2016-08-14 DIAGNOSIS — I5043 Acute on chronic combined systolic (congestive) and diastolic (congestive) heart failure: Secondary | ICD-10-CM | POA: Diagnosis present

## 2016-08-14 DIAGNOSIS — Z905 Acquired absence of kidney: Secondary | ICD-10-CM | POA: Diagnosis not present

## 2016-08-14 DIAGNOSIS — R443 Hallucinations, unspecified: Secondary | ICD-10-CM | POA: Diagnosis present

## 2016-08-14 DIAGNOSIS — Z85828 Personal history of other malignant neoplasm of skin: Secondary | ICD-10-CM

## 2016-08-14 DIAGNOSIS — R0902 Hypoxemia: Secondary | ICD-10-CM | POA: Diagnosis not present

## 2016-08-14 DIAGNOSIS — Z85528 Personal history of other malignant neoplasm of kidney: Secondary | ICD-10-CM

## 2016-08-14 DIAGNOSIS — F0390 Unspecified dementia without behavioral disturbance: Secondary | ICD-10-CM | POA: Diagnosis not present

## 2016-08-14 DIAGNOSIS — R0602 Shortness of breath: Secondary | ICD-10-CM

## 2016-08-14 DIAGNOSIS — E785 Hyperlipidemia, unspecified: Secondary | ICD-10-CM | POA: Diagnosis present

## 2016-08-14 DIAGNOSIS — R460 Very low level of personal hygiene: Secondary | ICD-10-CM | POA: Diagnosis not present

## 2016-08-14 DIAGNOSIS — R05 Cough: Secondary | ICD-10-CM | POA: Diagnosis not present

## 2016-08-14 DIAGNOSIS — F0281 Dementia in other diseases classified elsewhere with behavioral disturbance: Secondary | ICD-10-CM | POA: Diagnosis present

## 2016-08-14 DIAGNOSIS — E876 Hypokalemia: Secondary | ICD-10-CM | POA: Diagnosis present

## 2016-08-14 DIAGNOSIS — F329 Major depressive disorder, single episode, unspecified: Secondary | ICD-10-CM | POA: Diagnosis present

## 2016-08-14 DIAGNOSIS — I130 Hypertensive heart and chronic kidney disease with heart failure and stage 1 through stage 4 chronic kidney disease, or unspecified chronic kidney disease: Secondary | ICD-10-CM | POA: Diagnosis present

## 2016-08-14 DIAGNOSIS — R4182 Altered mental status, unspecified: Secondary | ICD-10-CM

## 2016-08-14 DIAGNOSIS — I252 Old myocardial infarction: Secondary | ICD-10-CM | POA: Diagnosis not present

## 2016-08-14 DIAGNOSIS — I482 Chronic atrial fibrillation, unspecified: Secondary | ICD-10-CM | POA: Diagnosis present

## 2016-08-14 DIAGNOSIS — I10 Essential (primary) hypertension: Secondary | ICD-10-CM | POA: Diagnosis not present

## 2016-08-14 DIAGNOSIS — F02818 Dementia in other diseases classified elsewhere, unspecified severity, with other behavioral disturbance: Secondary | ICD-10-CM

## 2016-08-14 DIAGNOSIS — J9621 Acute and chronic respiratory failure with hypoxia: Secondary | ICD-10-CM

## 2016-08-14 DIAGNOSIS — R410 Disorientation, unspecified: Secondary | ICD-10-CM

## 2016-08-14 DIAGNOSIS — C649 Malignant neoplasm of unspecified kidney, except renal pelvis: Secondary | ICD-10-CM | POA: Diagnosis present

## 2016-08-14 DIAGNOSIS — F4320 Adjustment disorder, unspecified: Secondary | ICD-10-CM | POA: Diagnosis not present

## 2016-08-14 DIAGNOSIS — G9340 Encephalopathy, unspecified: Secondary | ICD-10-CM | POA: Diagnosis present

## 2016-08-14 DIAGNOSIS — I5021 Acute systolic (congestive) heart failure: Secondary | ICD-10-CM | POA: Diagnosis not present

## 2016-08-14 DIAGNOSIS — J449 Chronic obstructive pulmonary disease, unspecified: Secondary | ICD-10-CM | POA: Diagnosis present

## 2016-08-14 DIAGNOSIS — I2510 Atherosclerotic heart disease of native coronary artery without angina pectoris: Secondary | ICD-10-CM | POA: Diagnosis present

## 2016-08-14 DIAGNOSIS — G931 Anoxic brain damage, not elsewhere classified: Secondary | ICD-10-CM | POA: Diagnosis present

## 2016-08-14 DIAGNOSIS — K219 Gastro-esophageal reflux disease without esophagitis: Secondary | ICD-10-CM | POA: Diagnosis present

## 2016-08-14 DIAGNOSIS — I714 Abdominal aortic aneurysm, without rupture, unspecified: Secondary | ICD-10-CM | POA: Diagnosis present

## 2016-08-14 DIAGNOSIS — Z87891 Personal history of nicotine dependence: Secondary | ICD-10-CM | POA: Diagnosis not present

## 2016-08-14 DIAGNOSIS — Z888 Allergy status to other drugs, medicaments and biological substances status: Secondary | ICD-10-CM | POA: Diagnosis not present

## 2016-08-14 DIAGNOSIS — N400 Enlarged prostate without lower urinary tract symptoms: Secondary | ICD-10-CM | POA: Diagnosis present

## 2016-08-14 DIAGNOSIS — I719 Aortic aneurysm of unspecified site, without rupture: Secondary | ICD-10-CM | POA: Diagnosis not present

## 2016-08-14 DIAGNOSIS — I509 Heart failure, unspecified: Secondary | ICD-10-CM | POA: Diagnosis not present

## 2016-08-14 DIAGNOSIS — Z8673 Personal history of transient ischemic attack (TIA), and cerebral infarction without residual deficits: Secondary | ICD-10-CM

## 2016-08-14 DIAGNOSIS — I700 Atherosclerosis of aorta: Secondary | ICD-10-CM | POA: Diagnosis not present

## 2016-08-14 DIAGNOSIS — Z7983 Long term (current) use of bisphosphonates: Secondary | ICD-10-CM

## 2016-08-14 DIAGNOSIS — G309 Alzheimer's disease, unspecified: Secondary | ICD-10-CM | POA: Diagnosis present

## 2016-08-14 DIAGNOSIS — R634 Abnormal weight loss: Secondary | ICD-10-CM | POA: Diagnosis not present

## 2016-08-14 DIAGNOSIS — Z79899 Other long term (current) drug therapy: Secondary | ICD-10-CM

## 2016-08-14 DIAGNOSIS — I5023 Acute on chronic systolic (congestive) heart failure: Secondary | ICD-10-CM

## 2016-08-14 DIAGNOSIS — I447 Left bundle-branch block, unspecified: Secondary | ICD-10-CM | POA: Diagnosis present

## 2016-08-14 DIAGNOSIS — F05 Delirium due to known physiological condition: Secondary | ICD-10-CM | POA: Diagnosis not present

## 2016-08-14 DIAGNOSIS — I517 Cardiomegaly: Secondary | ICD-10-CM | POA: Diagnosis not present

## 2016-08-14 DIAGNOSIS — Z8042 Family history of malignant neoplasm of prostate: Secondary | ICD-10-CM | POA: Diagnosis not present

## 2016-08-14 DIAGNOSIS — J90 Pleural effusion, not elsewhere classified: Secondary | ICD-10-CM | POA: Diagnosis not present

## 2016-08-14 DIAGNOSIS — I472 Ventricular tachycardia: Secondary | ICD-10-CM | POA: Diagnosis not present

## 2016-08-14 DIAGNOSIS — I739 Peripheral vascular disease, unspecified: Secondary | ICD-10-CM | POA: Diagnosis present

## 2016-08-14 DIAGNOSIS — I110 Hypertensive heart disease with heart failure: Secondary | ICD-10-CM | POA: Diagnosis not present

## 2016-08-14 DIAGNOSIS — G308 Other Alzheimer's disease: Secondary | ICD-10-CM | POA: Diagnosis not present

## 2016-08-14 DIAGNOSIS — G4733 Obstructive sleep apnea (adult) (pediatric): Secondary | ICD-10-CM | POA: Diagnosis present

## 2016-08-14 DIAGNOSIS — Z7901 Long term (current) use of anticoagulants: Secondary | ICD-10-CM

## 2016-08-14 DIAGNOSIS — N184 Chronic kidney disease, stage 4 (severe): Secondary | ICD-10-CM | POA: Diagnosis present

## 2016-08-14 HISTORY — DX: Unspecified osteoarthritis, unspecified site: M19.90

## 2016-08-14 HISTORY — DX: Migraine, unspecified, not intractable, without status migrainosus: G43.909

## 2016-08-14 HISTORY — DX: Basal cell carcinoma of skin, unspecified: C44.91

## 2016-08-14 HISTORY — DX: Sleep apnea, unspecified: G47.30

## 2016-08-14 HISTORY — DX: Cerebral infarction, unspecified: I63.9

## 2016-08-14 HISTORY — DX: Chronic obstructive pulmonary disease, unspecified: J44.9

## 2016-08-14 HISTORY — DX: Chronic kidney disease, stage 3 (moderate): N18.3

## 2016-08-14 HISTORY — DX: Chronic kidney disease, stage 3 unspecified: N18.30

## 2016-08-14 HISTORY — DX: Personal history of urinary calculi: Z87.442

## 2016-08-14 HISTORY — DX: Chronic atrial fibrillation, unspecified: I48.20

## 2016-08-14 LAB — URINALYSIS, ROUTINE W REFLEX MICROSCOPIC
Bilirubin Urine: NEGATIVE
GLUCOSE, UA: NEGATIVE mg/dL
Hgb urine dipstick: NEGATIVE
Ketones, ur: 15 mg/dL — AB
LEUKOCYTES UA: NEGATIVE
Nitrite: NEGATIVE
PH: 5.5 (ref 5.0–8.0)
Protein, ur: 100 mg/dL — AB
Specific Gravity, Urine: 1.018 (ref 1.005–1.030)

## 2016-08-14 LAB — I-STAT ARTERIAL BLOOD GAS, ED
Acid-base deficit: 3 mmol/L — ABNORMAL HIGH (ref 0.0–2.0)
Bicarbonate: 20 mmol/L (ref 20.0–28.0)
O2 Saturation: 93 %
Patient temperature: 98
TCO2: 21 mmol/L (ref 0–100)
pCO2 arterial: 29.8 mmHg — ABNORMAL LOW (ref 32.0–48.0)
pH, Arterial: 7.434 (ref 7.350–7.450)
pO2, Arterial: 64 mmHg — ABNORMAL LOW (ref 83.0–108.0)

## 2016-08-14 LAB — COMPREHENSIVE METABOLIC PANEL WITH GFR
ALT: 14 U/L — ABNORMAL LOW (ref 17–63)
AST: 20 U/L (ref 15–41)
Albumin: 3.6 g/dL (ref 3.5–5.0)
Alkaline Phosphatase: 77 U/L (ref 38–126)
Anion gap: 8 (ref 5–15)
BUN: 25 mg/dL — ABNORMAL HIGH (ref 6–20)
CO2: 24 mmol/L (ref 22–32)
Calcium: 8.8 mg/dL — ABNORMAL LOW (ref 8.9–10.3)
Chloride: 110 mmol/L (ref 101–111)
Creatinine, Ser: 2.01 mg/dL — ABNORMAL HIGH (ref 0.61–1.24)
GFR calc Af Amer: 36 mL/min — ABNORMAL LOW
GFR calc non Af Amer: 31 mL/min — ABNORMAL LOW
Glucose, Bld: 96 mg/dL (ref 65–99)
Potassium: 3.3 mmol/L — ABNORMAL LOW (ref 3.5–5.1)
Sodium: 142 mmol/L (ref 135–145)
Total Bilirubin: 1.7 mg/dL — ABNORMAL HIGH (ref 0.3–1.2)
Total Protein: 6.4 g/dL — ABNORMAL LOW (ref 6.5–8.1)

## 2016-08-14 LAB — PROTIME-INR
INR: 2.86
INR: 2.8
Prothrombin Time: 30.6 s — ABNORMAL HIGH (ref 11.4–15.2)
Prothrombin Time: 30.1 seconds — ABNORMAL HIGH (ref 11.4–15.2)

## 2016-08-14 LAB — RAPID URINE DRUG SCREEN, HOSP PERFORMED
AMPHETAMINES: NOT DETECTED
BARBITURATES: NOT DETECTED
Benzodiazepines: NOT DETECTED
Cocaine: NOT DETECTED
OPIATES: NOT DETECTED
TETRAHYDROCANNABINOL: NOT DETECTED

## 2016-08-14 LAB — CBC WITH DIFFERENTIAL/PLATELET
BASOS ABS: 0 10*3/uL (ref 0.0–0.1)
BASOS PCT: 1 %
Eosinophils Absolute: 0.1 10*3/uL (ref 0.0–0.7)
Eosinophils Relative: 2 %
HEMATOCRIT: 36.3 % — AB (ref 39.0–52.0)
HEMOGLOBIN: 12 g/dL — AB (ref 13.0–17.0)
LYMPHS PCT: 11 %
Lymphs Abs: 0.5 10*3/uL — ABNORMAL LOW (ref 0.7–4.0)
MCH: 31.4 pg (ref 26.0–34.0)
MCHC: 33.1 g/dL (ref 30.0–36.0)
MCV: 95 fL (ref 78.0–100.0)
MONO ABS: 0.4 10*3/uL (ref 0.1–1.0)
Monocytes Relative: 9 %
NEUTROS ABS: 3.8 10*3/uL (ref 1.7–7.7)
NEUTROS PCT: 77 %
Platelets: 153 10*3/uL (ref 150–400)
RBC: 3.82 MIL/uL — AB (ref 4.22–5.81)
RDW: 16.7 % — AB (ref 11.5–15.5)
WBC: 4.9 10*3/uL (ref 4.0–10.5)

## 2016-08-14 LAB — MAGNESIUM: Magnesium: 2.1 mg/dL (ref 1.7–2.4)

## 2016-08-14 LAB — APTT: APTT: 52 s — AB (ref 24–36)

## 2016-08-14 LAB — URINALYSIS, MICROSCOPIC (REFLEX)

## 2016-08-14 LAB — VITAMIN B12: Vitamin B-12: 678 pg/mL (ref 180–914)

## 2016-08-14 LAB — TSH: TSH: 2.556 u[IU]/mL (ref 0.350–4.500)

## 2016-08-14 LAB — AMMONIA: Ammonia: 13 umol/L (ref 9–35)

## 2016-08-14 LAB — BRAIN NATRIURETIC PEPTIDE: B NATRIURETIC PEPTIDE 5: 864.3 pg/mL — AB (ref 0.0–100.0)

## 2016-08-14 LAB — TROPONIN I: Troponin I: <0.03 ng/mL (ref <0.03)

## 2016-08-14 LAB — PHOSPHORUS: Phosphorus: 3.3 mg/dL (ref 2.5–4.6)

## 2016-08-14 MED ORDER — WARFARIN SODIUM 5 MG PO TABS: 5.0000 mg | ORAL_TABLET | Freq: Once | ORAL | Status: DC

## 2016-08-14 MED ORDER — SERTRALINE HCL 100 MG PO TABS
100.0000 mg | ORAL_TABLET | Freq: Every evening | ORAL | Status: DC
  Administered 2016-08-14 – 2016-08-21 (×8): 100 mg via ORAL
  Filled 2016-08-14 (×8): qty 1

## 2016-08-14 MED ORDER — LORAZEPAM 2 MG/ML IJ SOLN
1.0000 mg | Freq: Four times a day (QID) | INTRAMUSCULAR | Status: DC | PRN
  Administered 2016-08-14: 1 mg via INTRAVENOUS
  Administered 2016-08-15: 2 mg via INTRAVENOUS
  Filled 2016-08-14 (×2): qty 1

## 2016-08-14 MED ORDER — ONDANSETRON HCL 4 MG/2ML IJ SOLN: 4.0000 mg | Freq: Four times a day (QID) | INTRAMUSCULAR | Status: DC | PRN

## 2016-08-14 MED ORDER — TAMSULOSIN HCL 0.4 MG PO CAPS
0.4000 mg | ORAL_CAPSULE | Freq: Every day | ORAL | Status: DC
  Administered 2016-08-14 – 2016-08-22 (×10): 0.4 mg via ORAL
  Filled 2016-08-14 (×10): qty 1

## 2016-08-14 MED ORDER — SODIUM CHLORIDE 0.9% FLUSH: 3.0000 mL | INTRAVENOUS | Status: DC | PRN

## 2016-08-14 MED ORDER — FUROSEMIDE 10 MG/ML IJ SOLN
40.0000 mg | Freq: Two times a day (BID) | INTRAMUSCULAR | Status: DC
  Administered 2016-08-14 – 2016-08-15 (×3): 40 mg via INTRAVENOUS
  Filled 2016-08-14 (×3): qty 4

## 2016-08-14 MED ORDER — ONDANSETRON HCL 4 MG PO TABS: 4.0000 mg | ORAL_TABLET | Freq: Four times a day (QID) | ORAL | Status: DC | PRN

## 2016-08-14 MED ORDER — FAMOTIDINE 20 MG PO TABS
20.0000 mg | ORAL_TABLET | Freq: Every day | ORAL | Status: DC
  Administered 2016-08-14 – 2016-08-22 (×10): 20 mg via ORAL
  Filled 2016-08-14 (×11): qty 1

## 2016-08-14 MED ORDER — ACETAMINOPHEN 650 MG RE SUPP: 650.0000 mg | Freq: Four times a day (QID) | RECTAL | Status: DC | PRN

## 2016-08-14 MED ORDER — ISOSORBIDE DINITRATE 10 MG PO TABS
20.0000 mg | ORAL_TABLET | Freq: Three times a day (TID) | ORAL | Status: DC
  Administered 2016-08-14 – 2016-08-22 (×23): 20 mg via ORAL
  Filled 2016-08-14 (×24): qty 2

## 2016-08-14 MED ORDER — ACETAMINOPHEN 325 MG PO TABS: 650.0000 mg | ORAL_TABLET | Freq: Four times a day (QID) | ORAL | Status: DC | PRN

## 2016-08-14 MED ORDER — FUROSEMIDE 10 MG/ML IJ SOLN
60.0000 mg | Freq: Once | INTRAMUSCULAR | Status: AC
  Administered 2016-08-14: 60 mg via INTRAVENOUS
  Filled 2016-08-14: qty 6

## 2016-08-14 MED ORDER — ENSURE ENLIVE PO LIQD
237.0000 mL | Freq: Two times a day (BID) | ORAL | Status: DC
  Administered 2016-08-14 – 2016-08-21 (×11): 237 mL via ORAL
  Filled 2016-08-14: qty 237

## 2016-08-14 MED ORDER — METOPROLOL TARTRATE 5 MG/5ML IV SOLN
5.0000 mg | Freq: Once | INTRAVENOUS | Status: AC
  Administered 2016-08-14: 5 mg via INTRAVENOUS
  Filled 2016-08-14: qty 5

## 2016-08-14 MED ORDER — WARFARIN SODIUM 5 MG PO TABS
5.0000 mg | ORAL_TABLET | ORAL | Status: AC
  Administered 2016-08-14: 5 mg via ORAL
  Filled 2016-08-14: qty 1

## 2016-08-14 MED ORDER — SODIUM CHLORIDE 0.9% FLUSH
3.0000 mL | Freq: Two times a day (BID) | INTRAVENOUS | Status: DC
  Administered 2016-08-14 – 2016-08-22 (×15): 3 mL via INTRAVENOUS

## 2016-08-14 MED ORDER — METOPROLOL TARTRATE 100 MG PO TABS
100.0000 mg | ORAL_TABLET | Freq: Two times a day (BID) | ORAL | Status: DC
  Administered 2016-08-14 – 2016-08-17 (×6): 100 mg via ORAL
  Filled 2016-08-14 (×4): qty 2
  Filled 2016-08-14: qty 1
  Filled 2016-08-14 (×2): qty 2

## 2016-08-14 MED ORDER — LOSARTAN POTASSIUM 50 MG PO TABS
50.0000 mg | ORAL_TABLET | Freq: Every evening | ORAL | Status: DC
  Administered 2016-08-14 – 2016-08-21 (×8): 50 mg via ORAL
  Filled 2016-08-14 (×8): qty 1

## 2016-08-14 MED ORDER — AMLODIPINE BESYLATE 10 MG PO TABS
10.0000 mg | ORAL_TABLET | Freq: Every evening | ORAL | Status: DC
  Administered 2016-08-14 – 2016-08-15 (×2): 10 mg via ORAL
  Filled 2016-08-14 (×2): qty 1

## 2016-08-14 MED ORDER — WARFARIN - PHARMACIST DOSING INPATIENT
Freq: Every day | Status: DC
  Administered 2016-08-17 – 2016-08-18 (×2)

## 2016-08-14 MED ORDER — ATORVASTATIN CALCIUM 40 MG PO TABS
40.0000 mg | ORAL_TABLET | Freq: Every day | ORAL | Status: DC
  Administered 2016-08-14 – 2016-08-21 (×8): 40 mg via ORAL
  Filled 2016-08-14 (×8): qty 1

## 2016-08-14 MED ORDER — SODIUM CHLORIDE 0.9 % IV SOLN: 250.0000 mL | INTRAVENOUS | Status: DC | PRN

## 2016-08-14 NOTE — H&P (Signed)
History and Physical    Mark [name] JEH:[id number] DOB: April 19, 1941 DOA: 08/14/2016  PCP: Mark Pear., NP Patient coming from: home  Chief Complaint: confusion  HPI: Mark [name] is a 76 y.o. male with medical history significant of AAA, atrial fibrillation, coronary artery disease, C KD, GERD, renal cell carcinoma status post nephrectomy, hypertension, presenting with two-week history of confusion. Level V caveat applies his history is limited due to patient's mental status and is obtained only in part by family members. Patient endorses "weird dreams " with reoccurring vivid dreams but always tired to reality such as curtains in his house or an every dreams about her where he is. Patient states that he realizes that these are unreal experiences. Patient's ex wife endorses taking 2 some of his family members who state that he is having bizarre phone calls with them starting as far back as 2 weeks. One day ago patient's son, who lives with him,  called patient's ex wife due to concerns of her confusion where patient thought he was at his wife's house when all reality he was at his own house. Patient denies any dysuria, frequency, chest pain, palpitations, nausea, vomiting, coughing, fevers, neck stiffness, headache, focal neurological deficit. Patient does endorse increasing dyspnea on exertion over the last 3 months but denies lower extremity swelling.    ED Course: Dicta findings outlined below. Placed on oxygen to treat hypoxemia. Given a dose of IV Lasix.  Review of Systems: As per HPI otherwise 10 point review of systems negative.   Ambulatory Status: No restrictions   Past Medical History:  Diagnosis Date  . AAA (abdominal aortic aneurysm) (HCC)    9.1 cm repair 05/2009 ( ENDOVASCULAR REPAIR ).  MORE RECENT RE-EVALUATION OF ENLARGING AAA BY DR. Trula Slade AND BY DR. Kathlene Cote  MAY 2015 - PT STATES HE UNDERSTANDS THAT NOTHING IS LEAKING AT PRESENT TIME AND HE IS TO FOLLOW UP WITH  HIS DOCTORS FOR FUTURE MONITORING I  . Arthritis    LEFT HAND, RT KNEE (08/14/2016)  . Atrial fibrillation ([place])   . Basal cell carcinoma    "right side of my nose; think they burned it off" (08/14/2016)  . CAD (coronary artery disease)   . CHF (congestive heart failure) ([place]) 08/14/2016  . Chronic kidney disease    CHRONIC RENAL INSUFFICIENCY  . COPD (chronic obstructive pulmonary disease) ([place])   . Depression   . Dysrhythmia    AF  . GERD (gastroesophageal reflux disease)   . History of kidney stones   . History of pneumothorax    LONG TIME AGO  . Hyperlipidemia   . Hypertension   . Migraine    "very rare the last 20 years" (08/14/2016)  . Myocardial infarction 2006  . Osteoarthritis   . Renal cell carcinoma ([place])   . Shingles outbreak    PT NOTICED SKIN RASH RT GROIN AND RIGHT TRUNK ON MONDAY 10/27/13 - NOT A LOT OF DISCOMFORT- NOT ON ANY ORAL MEDS TO TREAT.  Marland Kitchen Shortness of breath   . Sleep apnea    "dx'd; didn't try mask" (08/14/2016)  . Stroke [facility])    "CT showed I've had old strokes; never knew about it before today" (08/14/2016)    Past Surgical History:  Procedure Laterality Date  . ABDOMINAL AORTAGRAM N/A 09/24/2013   Procedure: ABDOMINAL Maxcine Ham;  Surgeon: Serafina Mitchell, MD;  Location: [facility] CATH LAB;  Service: Cardiovascular;  Laterality: N/A;  . ABDOMINAL AORTIC ANEURYSM REPAIR  JAN 2011  ENDOVASCULAR - DR. BRABHAM  . CARDIAC CATHETERIZATION  07/04/2004   [facility] 07/04/2004  . COLONOSCOPY  early 1990s   /notes 11/11/2008  . CORONARY ANGIOPLASTY WITH STENT PLACEMENT  07/05/2004   notes 07/05/2004  . KNEE ARTHROSCOPY Right   . LITHOTRIPSY    . ROBOT ASSISTED LAPAROSCOPIC NEPHRECTOMY Left 11/06/2013   Procedure: ROBOTIC ASSISTED LAPAROSCOPIC RADICAL  NEPHRECTOMY;  Surgeon: Alexis Frock, MD;  Location: [facility] ORS;  Service: Urology;  Laterality: Left;  . TONSILLECTOMY  1964    Social History   Social History  . Marital status: Divorced    Spouse name: N/A  . Number of  children: 2  . Years of education: N/A   Occupational History  .  Retired   Social History Main Topics  . Smoking status: Former Smoker    Packs/day: 1.50    Years: 23.00    Types: Cigarettes    Quit date: 08/24/1980  . Smokeless tobacco: Never Used  . Alcohol use 1.5 oz/week    3 Standard drinks or equivalent per week     Comment: 08/14/2016 "maybe 2-3 drinks/month average"  . Drug use: No  . Sexual activity: Not on file   Other Topics Concern  . Not on file   Social History Narrative   Retired   Divorced   2 sons 54 &30     Occupation:  Part time sports broadcaster    Smoking Status:  quit (08/24/1980)   Packs/Day:  1.0    Caffeine use/day:  3 beverages daily    Does Patient Exercise:  no   Alcohol Use - yes    Allergies  Allergen Reactions  . Ace Inhibitors     REACTION: Cough    Family History  Problem Relation Age of Onset  . Pulmonary embolism Mother     died of PE  . Heart disease Mother     before age 13  . Anuerysm Father     history of popliteal  . Coronary artery disease    . Hyperlipidemia    . Hypertension    . Prostate cancer    . Other Neg Hx     polycystic kidney disease, and colon cancer  . Colon cancer Neg Hx     Prior to Admission medications   Medication Sig Start Date End Date Taking? Authorizing Provider  albuterol (PROVENTIL HFA;VENTOLIN HFA) 108 (90 Base) MCG/ACT inhaler Inhale 2 puffs into the lungs every 6 (six) hours as needed for wheezing or shortness of breath. 01/26/16   Crosby Oyster Wendling, DO  amLODipine (NORVASC) 10 MG tablet Take 1 tablet (10 mg total) by mouth every evening. 04/06/16   [name], NP  atorvastatin (LIPITOR) 40 MG tablet TAKE 1 TABLET ONE TIME DAILY 04/06/16   [name], NP  calcium elemental as carbonate (BARIATRIC TUMS ULTRA) 400 MG chewable tablet  04/11/15   Historical Provider, MD  Coenzyme Q10 200 MG capsule Take 200 mg by mouth daily.    Historical Provider, MD  furosemide (LASIX)  40 MG tablet Take 1 tablet (40 mg total) by mouth 2 (two) times daily. 04/06/16   [name], NP  isosorbide dinitrate (ISORDIL) 20 MG tablet Take 1 tablet (20 mg total) by mouth 3 (three) times daily. 04/06/16   [name], NP  losartan (COZAAR) 50 MG tablet Take 1 tablet (50 mg total) by mouth every evening. 06/02/16   [name], NP  metoprolol (LOPRESSOR) 100 MG tablet Take 1 tablet (100 mg total) by mouth  2 (two) times daily. 04/06/16   [name], NP  Multiple Vitamins-Minerals (MACUVITE EYE CARE PO) Take 1 capsule by mouth daily. 05/21/15   Historical Provider, MD  Omega-3 Fatty Acids (FISH OIL) 1200 MG CAPS Take 1,200 mg by mouth daily.     Historical Provider, MD  ranitidine (ZANTAC) 150 MG tablet Take 1 tablet (150 mg total) by mouth 2 (two) times daily. 04/06/16   [name], NP  sertraline (ZOLOFT) 100 MG tablet Take 1 tablet (100 mg total) by mouth every evening. 04/06/16   [name], NP  tamsulosin (FLOMAX) 0.4 MG CAPS capsule TAKE 1 CAPSULE (0.4 MG TOTAL) DAILY AFTER BREAKFAST. 06/02/16   [name], NP  warfarin (COUMADIN) 5 MG tablet TAKE 1 TABLET EVERY DAY EXCEPT 1.5 TABLETS ON WEDNESDAY. 06/02/16   [name], NP    Physical Exam: Vitals:   08/14/16 1318 08/14/16 1326 08/14/16 1433 08/14/16 1435  BP: (!) 147/104  (!) 151/103   Pulse: 83  96   Resp: 18  18   Temp: 98.4 F (36.9 C)  97.7 F (36.5 C)   TempSrc: Oral  Oral   SpO2: 94%  (!) 87% 94%  Weight:  101.6 kg (224 lb) 99 kg (218 lb 5 oz)   Height:  5' 11.5" (1.816 m) 5\' 11"  (1.803 m)      General:  Appears calm and comfortable Eyes:  PERRL, EOMI, normal lids, iris ENT:  grossly normal hearing, lips & tongue, mmm Neck:  no LAD, masses or thyromegaly Cardiovascular:  RRR, no m/r/g. No LE edema.  Respiratory:  CTA bilaterally, no w/r/r. Normal respiratory effort. Abdomen:  soft, ntnd, NABS Skin:  no rash or induration seen on limited  exam Musculoskeletal:  grossly normal tone BUE/BLE, good ROM, no bony abnormality Psychiatric: Intermittent ability to answer questions correctly otherwise gives significant amount of extra preemie yes and tangential information. Pleasant. Neurologic:  CN 2-12 grossly intact, moves all extremities in coordinated fashion, sensation intact  Labs on Admission: I have personally reviewed following labs and imaging studies  CBC:  Recent Labs Lab 08/14/16 1023  WBC 4.9  NEUTROABS 3.8  HGB 12.0*  HCT 36.3*  MCV 95.0  PLT 462   Basic Metabolic Panel:  Recent Labs Lab 08/14/16 1023  NA 142  K 3.3*  CL 110  CO2 24  GLUCOSE 96  BUN 25*  CREATININE 2.01*  CALCIUM 8.8*   GFR: Estimated Creatinine Clearance: 39.3 mL/min (A) (by C-G formula based on SCr of 2.01 mg/dL (H)). Liver Function Tests:  Recent Labs Lab 08/14/16 1023  AST 20  ALT 14*  ALKPHOS 77  BILITOT 1.7*  PROT 6.4*  ALBUMIN 3.6   No results for input(s): LIPASE, AMYLASE in the last 168 hours.  Recent Labs Lab 08/14/16 1303  AMMONIA 13   Coagulation Profile:  Recent Labs Lab 08/09/16 1655 08/14/16 1047  INR 2.6 2.86   Cardiac Enzymes:  Recent Labs Lab 08/14/16 1024  TROPONINI <0.03   BNP (last 3 results) No results for input(s): PROBNP in the last 8760 hours. HbA1C: No results for input(s): HGBA1C in the last 72 hours. CBG: No results for input(s): GLUCAP in the last 168 hours. Lipid Profile: No results for input(s): CHOL, HDL, LDLCALC, TRIG, CHOLHDL, LDLDIRECT in the last 72 hours. Thyroid Function Tests: No results for input(s): TSH, T4TOTAL, FREET4, T3FREE, THYROIDAB in the last 72 hours. Anemia Panel: No results for input(s): VITAMINB12, FOLATE, FERRITIN, TIBC, IRON, RETICCTPCT in the last 72 hours. Urine  analysis:    Component Value Date/Time   COLORURINE YELLOW 08/14/2016 1142   APPEARANCEUR CLEAR 08/14/2016 1142   LABSPEC 1.018 08/14/2016 1142   PHURINE 5.5 08/14/2016 1142    GLUCOSEU NEGATIVE 08/14/2016 1142   GLUCOSEU NEGATIVE 07/17/2014 0945   HGBUR NEGATIVE 08/14/2016 1142   BILIRUBINUR NEGATIVE 08/14/2016 1142   KETONESUR 15 (A) 08/14/2016 1142   PROTEINUR 100 (A) 08/14/2016 1142   UROBILINOGEN 0.2 07/17/2014 0945   NITRITE NEGATIVE 08/14/2016 1142   LEUKOCYTESUR NEGATIVE 08/14/2016 1142    Creatinine Clearance: Estimated Creatinine Clearance: 39.3 mL/min (A) (by C-G formula based on SCr of 2.01 mg/dL (H)).  Sepsis Labs: @LABRCNTIP (procalcitonin:4,lacticidven:4) )No results found for this or any previous visit (from the past 240 hour(s)).   Radiological Exams on Admission: Dg Chest 2 View  Result Date: 08/14/2016 CLINICAL DATA:  Cough and chest congestion.  Altered mental status. EXAM: CHEST  2 VIEW COMPARISON:  01/05/2016 FINDINGS: The heart size normal. Pulmonary vascularity is at the upper limits of normal. The patient has new bilateral small pleural effusions. Extensive calcification in the tortuous thoracic aorta. No discrete pulmonary edema. No acute bone abnormality. IMPRESSION: New small bilateral pleural effusions. Pulmonary vascularity is at the upper limits of normal. Electronically Signed   By: Lorriane Shire M.D.   On: 08/14/2016 11:16   Ct Head Wo Contrast  Result Date: 08/14/2016 CLINICAL DATA:  76 year old hypertensive male altered mental status. Initial encounter. EXAM: CT HEAD WITHOUT CONTRAST TECHNIQUE: Contiguous axial images were obtained from the base of the skull through the vertex without intravenous contrast. COMPARISON:  None. FINDINGS: Brain: No intracranial hemorrhage. Prominent chronic microvascular changes and remote bilateral basal ganglia infarcts without CT evidence of large acute infarct. Mild global atrophy without hydrocephalus. No intracranial mass lesion noted on this unenhanced exam. Vascular: Vascular calcifications. Skull: No acute abnormality. Sinuses/Orbits: No acute orbital abnormality. Visualized paranasal sinuses  are clear. Other: Negative IMPRESSION: Prominent chronic microvascular changes and remote bilateral basal ganglia infarcts without CT evidence of large acute infarct. No intracranial hemorrhage. Mild global atrophy. Electronically Signed   By: Genia Del M.D.   On: 08/14/2016 11:27    EKG: Independently reviewed. Afib.   Assessment/Plan Active Problems:   BPH (benign prostatic hyperplasia)   Atrial fibrillation, chronic (HCC)   AAA (abdominal aortic aneurysm) without rupture (HCC)   Renal cell carcinoma (HCC)   Acute encephalopathy   Acute on chronic respiratory failure with hypoxia (HCC)   Acute encephalopathy: predominantly hallucinations and vivid dreams. Hypoxemia vs intracranial process not yet visualized. CT head w/o acute process. UDS negative. No evidence for acute process. No reported seizures. h/o renal cell cancer w/ vague possibility of mets per EDP report. No significant metabolic derangements. Question psych etiology. No sedating medications.  - Neuro checks - MRI - O2 and treatment of CHF as below.  - COnsider psyc consult in am if not improving.   Acute respiratory failure: likely secondary to diastolic CHF. Previous echo w/ nml EF and evidence of diastolic chf. CXR w/o evidence of infectious process. BNP 84.  Troponin.01. Lasix 60 IV x1 in ED at [facility]. ABG pH 7.4, PCO2 29, P O2 64, CO2 21.  - Lasix 40 IV BID - Echo.  - Wean O2 as able.   CKD: Cr 2.0. Near baseline. s/p nephrectomy - BMET in am  HTN: - continue losartan, norvasc, metop, isosorbide  BPH: - continue flomax  GERD: - contineu pepcid  Depression:  - continue zoloft.   HLD: -  continue lipitor  Afib: - tele - continue bblocker, coumadin  AAA: - f/u outpt. Pt due for his yearly appt and imaging.    DVT prophylaxis: coumadin  Code Status: full  Family Communication: ex-wife - Pt gave approval to discuss condition w/ Ex-wife  Disposition Plan: pending improvement   Consults called: nonoe   Admission status: observation    Om Lizotte J MD [facility]  If 7PM-7AM, please contact night-coverage [email or web address] Password [email or web address]  08/14/2016, 5:15 PM

## 2016-08-14 NOTE — ED Notes (Signed)
Patient transported to CT 

## 2016-08-14 NOTE — Progress Notes (Signed)
Patient going down for MRI. Ativan 1 mg given as per patient  request.

## 2016-08-14 NOTE — ED Provider Notes (Signed)
[facility] Provider Note   CSN: [id number] Arrival date & time: 08/14/16  1004     History   Chief Complaint Chief Complaint  Patient presents with  . Altered Mental Status    HPI Mark [name] is a 77 y.o. male.  HPI   Reports coughing, confusion, hallucinations, dyspnea.     Yesterday called Kalman Shan (his ex-wife he has not spoken to for 3 years) for a ride, and said he was at her house. Also told her all clothing was at place of his first job.  Reports some history of prior confusion about location, forgetting where he was going over years.  Reports he was aggravated yesterday as day has not gone appropriately.  Reports very vivid dreams about how to get home for about 2 weeks.  Told son he was surrounded by robotic person, patient reports it was a dream but ex-wife reports he was talking about this like he was hallucinating it at the time, not like he was recounting a dream.   Ex-wife thinks his speech has changed, seems more "thick tongued" patient reports dry tongue.  No numbness/weakness, no change in vision, no facial droop,   No chest pain.  Reports some dyspnea walking across parking lot, walking up to third floor, this has increased over last week or so.  Leg swelling increased per pt and ex-wife.   Coughing for a few weeks. Nonproductive, clear at times.   AxOx3  Past Medical History:  Diagnosis Date  . AAA (abdominal aortic aneurysm) (HCC)    9.1 cm repair 05/2009 ( ENDOVASCULAR REPAIR ).  MORE RECENT RE-EVALUATION OF ENLARGING AAA BY DR. Trula Slade AND BY DR. Kathlene Cote  MAY 2015 - PT STATES HE UNDERSTANDS THAT NOTHING IS LEAKING AT PRESENT TIME AND HE IS TO FOLLOW UP WITH HIS DOCTORS FOR FUTURE MONITORING I  . Arthritis    LEFT HAND, RT KNEE (08/14/2016)  . Atrial fibrillation ([place])   . Basal cell carcinoma    "right side of my nose; think they burned it off" (08/14/2016)  . CAD (coronary artery disease)   . CHF (congestive heart failure) ([place]) 08/14/2016    . Chronic kidney disease    CHRONIC RENAL INSUFFICIENCY  . COPD (chronic obstructive pulmonary disease) ([place])   . Depression   . Dysrhythmia    AF  . GERD (gastroesophageal reflux disease)   . History of kidney stones   . History of pneumothorax    LONG TIME AGO  . Hyperlipidemia   . Hypertension   . Migraine    "very rare the last 20 years" (08/14/2016)  . Myocardial infarction 2006  . Osteoarthritis   . Renal cell carcinoma ([place])   . Shingles outbreak    PT NOTICED SKIN RASH RT GROIN AND RIGHT TRUNK ON MONDAY 10/27/13 - NOT A LOT OF DISCOMFORT- NOT ON ANY ORAL MEDS TO TREAT.  Marland Kitchen Shortness of breath   . Sleep apnea    "dx'd; didn't try mask" (08/14/2016)  . Stroke [facility])    "CT showed I've had old strokes; never knew about it before today" (08/14/2016)    Patient Active Problem List   Diagnosis Date Noted  . Acute encephalopathy 08/14/2016  . Acute on chronic respiratory failure with hypoxia ([place]) 08/14/2016  . Asthma 03/24/2016  . Preventative health care 09/22/2015  . Osteopenia 09/07/2014  . Osteoarthritis, hand 04/09/2014  . Normocytic anemia 04/09/2014  . OSA (obstructive sleep apnea) 01/08/2014  . Daytime somnolence 11/20/2013  . Renal  cell carcinoma ([place]) 11/06/2013  . AAA (abdominal aortic aneurysm) without rupture ([place]) 09/22/2013  . Atrial fibrillation, chronic ([place]) 07/25/2012  . 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 BPH (benign prostatic hyperplasia) 12/21/2009  . Abdominal aortic aneurysm ([place]) 05/28/2009  . Disorder resulting from impaired renal function 05/28/2009  . Hyperlipidemia 11/11/2008  . Depression 11/11/2008  . Essential hypertension 11/11/2008  . Coronary atherosclerosis 11/11/2008  . GERD 11/11/2008  . SKIN CANCER, HX OF 11/11/2008  . NEPHROLITHIASIS, HX OF 11/11/2008    Past Surgical History:  Procedure Laterality Date  . ABDOMINAL AORTAGRAM N/A 09/24/2013   Procedure: ABDOMINAL Maxcine Ham;  Surgeon: Serafina Mitchell, MD;   Location: [facility] CATH LAB;  Service: Cardiovascular;  Laterality: N/A;  . ABDOMINAL AORTIC ANEURYSM REPAIR  JAN 2011   ENDOVASCULAR - DR. BRABHAM  . CARDIAC CATHETERIZATION  07/04/2004   Archie Endo 07/04/2004  . COLONOSCOPY  early 1990s   /notes 11/11/2008  . CORONARY ANGIOPLASTY WITH STENT PLACEMENT  07/05/2004   notes 07/05/2004  . KNEE ARTHROSCOPY Right   . LITHOTRIPSY    . ROBOT ASSISTED LAPAROSCOPIC NEPHRECTOMY Left 11/06/2013   Procedure: ROBOTIC ASSISTED LAPAROSCOPIC RADICAL  NEPHRECTOMY;  Surgeon: Alexis Frock, MD;  Location: [facility] ORS;  Service: Urology;  Laterality: Left;  . TONSILLECTOMY  1964       Home Medications    Prior to Admission medications   Medication Sig Start Date End Date Taking? Authorizing Provider  albuterol (PROVENTIL HFA;VENTOLIN HFA) 108 (90 Base) MCG/ACT inhaler Inhale 2 puffs into the lungs every 6 (six) hours as needed for wheezing or shortness of breath. 01/26/16  Yes Crosby Oyster Wendling, DO  amLODipine (NORVASC) 10 MG tablet Take 1 tablet (10 mg total) by mouth every evening. 04/06/16  Yes [name], NP  atorvastatin (LIPITOR) 40 MG tablet TAKE 1 TABLET ONE TIME DAILY 04/06/16  Yes [name], NP  calcium elemental as carbonate (BARIATRIC TUMS ULTRA) 400 MG chewable tablet  04/11/15  Yes Historical Provider, MD  Coenzyme Q10 200 MG capsule Take 200 mg by mouth daily.   Yes Historical Provider, MD  furosemide (LASIX) 40 MG tablet Take 1 tablet (40 mg total) by mouth 2 (two) times daily. 04/06/16  Yes [name], NP  isosorbide dinitrate (ISORDIL) 20 MG tablet Take 1 tablet (20 mg total) by mouth 3 (three) times daily. 04/06/16  Yes [name], NP  losartan (COZAAR) 50 MG tablet Take 1 tablet (50 mg total) by mouth every evening. 06/02/16  Yes [name], NP  metoprolol (LOPRESSOR) 100 MG tablet Take 1 tablet (100 mg total) by mouth 2 (two) times daily. 04/06/16  Yes [name], NP  Multiple Vitamins-Minerals  (MACUVITE EYE CARE PO) Take 1 capsule by mouth daily. 05/21/15  Yes Historical Provider, MD  Omega-3 Fatty Acids (FISH OIL) 1200 MG CAPS Take 1,200 mg by mouth daily.    Yes Historical Provider, MD  ranitidine (ZANTAC) 150 MG tablet Take 1 tablet (150 mg total) by mouth 2 (two) times daily. 04/06/16  Yes [name], NP  sertraline (ZOLOFT) 100 MG tablet Take 1 tablet (100 mg total) by mouth every evening. 04/06/16  Yes [name], NP  tamsulosin (FLOMAX) 0.4 MG CAPS capsule TAKE 1 CAPSULE (0.4 MG TOTAL) DAILY AFTER BREAKFAST. 06/02/16  Yes [name], NP  warfarin (COUMADIN) 5 MG tablet TAKE 1 TABLET EVERY DAY EXCEPT 1.5 TABLETS ON WEDNESDAY. 06/02/16  Yes [name], NP    Family History Family History  Problem Relation Age of  Onset  . Pulmonary embolism Mother     died of PE  . Heart disease Mother     before age 54  . Anuerysm Father     history of popliteal  . Coronary artery disease    . Hyperlipidemia    . Hypertension    . Prostate cancer    . Other Neg Hx     polycystic kidney disease, and colon cancer  . Colon cancer Neg Hx     Social History Social History  Substance Use Topics  . Smoking status: Former Smoker    Packs/day: 1.50    Years: 23.00    Types: Cigarettes    Quit date: 08/24/1980  . Smokeless tobacco: Never Used  . Alcohol use 1.5 oz/week    3 Standard drinks or equivalent per week     Comment: 08/14/2016 "maybe 2-3 drinks/month average"     Allergies   Ace inhibitors   Review of Systems Review of Systems  Constitutional: Negative for fever.  HENT: Negative for congestion and sore throat.   Eyes: Negative for visual disturbance.  Respiratory: Positive for cough and shortness of breath.   Cardiovascular: Positive for leg swelling. Negative for chest pain.  Gastrointestinal: Negative for abdominal pain, diarrhea, nausea and vomiting.  Genitourinary: Negative for difficulty urinating and dysuria.  Musculoskeletal:  Negative for back pain and neck stiffness.  Skin: Positive for rash.  Neurological: Negative for syncope and headaches.  Psychiatric/Behavioral: Positive for confusion and hallucinations.     Physical Exam Updated Vital Signs BP (!) 151/103 (BP Location: Right Arm)   Pulse 96   Temp 97.7 F (36.5 C) (Oral)   Resp 18   Ht 5\' 11"  (1.803 m)   Wt 218 lb 5 oz (99 kg) Comment: scale A  SpO2 94%   BMI 30.45 kg/m   Physical Exam  Constitutional: He is oriented to person, place, and time. He appears well-developed and well-nourished. No distress.  HENT:  Head: Normocephalic and atraumatic.  Mouth/Throat: Oropharynx is clear and moist. No oropharyngeal exudate.  Eyes: Conjunctivae and EOM are normal. Pupils are equal, round, and reactive to light.  Neck: Normal range of motion.  Cardiovascular: Normal rate, regular rhythm, normal heart sounds and intact distal pulses.  Exam reveals no gallop and no friction rub.   No murmur heard. Pulmonary/Chest: Effort normal and breath sounds normal. No respiratory distress. He has no wheezes. He has no rales.  Abdominal: Soft. He exhibits no distension. There is no tenderness. There is no guarding.  Musculoskeletal: He exhibits edema.  Neurological: He is alert and oriented to person, place, and time. He has normal strength. No cranial nerve deficit or sensory deficit. Coordination and gait normal. GCS eye subscore is 4. GCS verbal subscore is 5. GCS motor subscore is 6.  Skin: Skin is warm and dry. He is not diaphoretic.  Nursing note and vitals reviewed.    ED Treatments / Results  Labs (all labs ordered are listed, but only abnormal results are displayed) Labs Reviewed  CBC WITH DIFFERENTIAL/PLATELET - Abnormal; Notable for the following:       Result Value   RBC 3.82 (*)    Hemoglobin 12.0 (*)    HCT 36.3 (*)    RDW 16.7 (*)    Lymphs Abs 0.5 (*)    All other components within normal limits  COMPREHENSIVE METABOLIC PANEL - Abnormal;  Notable for the following:    Potassium 3.3 (*)    BUN 25 (*)  Creatinine, Ser 2.01 (*)    Calcium 8.8 (*)    Total Protein 6.4 (*)    ALT 14 (*)    Total Bilirubin 1.7 (*)    GFR calc non Af Amer 31 (*)    GFR calc Af Amer 36 (*)    All other components within normal limits  URINALYSIS, ROUTINE W REFLEX MICROSCOPIC - Abnormal; Notable for the following:    Ketones, ur 15 (*)    Protein, ur 100 (*)    All other components within normal limits  PROTIME-INR - Abnormal; Notable for the following:    Prothrombin Time 30.6 (*)    All other components within normal limits  BRAIN NATRIURETIC PEPTIDE - Abnormal; Notable for the following:    B Natriuretic Peptide 864.3 (*)    All other components within normal limits  URINALYSIS, MICROSCOPIC (REFLEX) - Abnormal; Notable for the following:    Bacteria, UA RARE (*)    Squamous Epithelial / LPF 0-5 (*)    All other components within normal limits  I-STAT ARTERIAL BLOOD GAS, ED - Abnormal; Notable for the following:    pCO2 arterial 29.8 (*)    pO2, Arterial 64.0 (*)    Acid-base deficit 3.0 (*)    All other components within normal limits  TROPONIN I  RAPID URINE DRUG SCREEN, HOSP PERFORMED  AMMONIA  BASIC METABOLIC PANEL  PHOSPHORUS  MAGNESIUM  CBC  TSH  VITAMIN B12  RPR  TROPONIN I  TROPONIN I  APTT  PROTIME-INR  HIV ANTIBODY (ROUTINE TESTING)  PROTIME-INR    EKG  EKG Interpretation  Date/Time:  Monday August 14 2016 10:21:54 EDT Ventricular Rate:  111 PR Interval:    QRS Duration: 174 QT Interval:  408 QTC Calculation: 555 R Axis:   -108 Text Interpretation:  Atrial fibrillation Left bundle branch block Anterior infarct, old Baseline wander in lead(s) V3 Since prior ECG in 2014, patient with wide complex, LBBB morphology Confirmed by [facility] MD, Amandalee Lacap ([id number]) on 08/14/2016 6:19:03 PM       Radiology Dg Chest 2 View  Result Date: 08/14/2016 CLINICAL DATA:  Cough and chest congestion.  Altered mental status.  EXAM: CHEST  2 VIEW COMPARISON:  01/05/2016 FINDINGS: The heart size normal. Pulmonary vascularity is at the upper limits of normal. The patient has new bilateral small pleural effusions. Extensive calcification in the tortuous thoracic aorta. No discrete pulmonary edema. No acute bone abnormality. IMPRESSION: New small bilateral pleural effusions. Pulmonary vascularity is at the upper limits of normal. Electronically Signed   By: Lorriane Shire M.D.   On: 08/14/2016 11:16   Ct Head Wo Contrast  Result Date: 08/14/2016 CLINICAL DATA:  76 year old hypertensive male altered mental status. Initial encounter. EXAM: CT HEAD WITHOUT CONTRAST TECHNIQUE: Contiguous axial images were obtained from the base of the skull through the vertex without intravenous contrast. COMPARISON:  None. FINDINGS: Brain: No intracranial hemorrhage. Prominent chronic microvascular changes and remote bilateral basal ganglia infarcts without CT evidence of large acute infarct. Mild global atrophy without hydrocephalus. No intracranial mass lesion noted on this unenhanced exam. Vascular: Vascular calcifications. Skull: No acute abnormality. Sinuses/Orbits: No acute orbital abnormality. Visualized paranasal sinuses are clear. Other: Negative IMPRESSION: Prominent chronic microvascular changes and remote bilateral basal ganglia infarcts without CT evidence of large acute infarct. No intracranial hemorrhage. Mild global atrophy. Electronically Signed   By: Genia Del M.D.   On: 08/14/2016 11:27    Procedures Procedures (including critical care time)  Medications Ordered in ED  Medications  feeding supplement (ENSURE ENLIVE) (ENSURE ENLIVE) liquid 237 mL (not administered)  losartan (COZAAR) tablet 50 mg (not administered)  tamsulosin (FLOMAX) capsule 0.4 mg (not administered)  amLODipine (NORVASC) tablet 10 mg (not administered)  atorvastatin (LIPITOR) tablet 40 mg (not administered)  isosorbide dinitrate (ISORDIL) tablet 20 mg (not  administered)  metoprolol (LOPRESSOR) tablet 100 mg (not administered)  sertraline (ZOLOFT) tablet 100 mg (not administered)  famotidine (PEPCID) tablet 20 mg (not administered)  sodium chloride flush (NS) 0.9 % injection 3 mL (not administered)  sodium chloride flush (NS) 0.9 % injection 3 mL (not administered)  0.9 %  sodium chloride infusion (not administered)  acetaminophen (TYLENOL) tablet 650 mg (not administered)    Or  acetaminophen (TYLENOL) suppository 650 mg (not administered)  ondansetron (ZOFRAN) tablet 4 mg (not administered)    Or  ondansetron (ZOFRAN) injection 4 mg (not administered)  furosemide (LASIX) injection 40 mg (not administered)  LORazepam (ATIVAN) injection 1-2 mg (not administered)  Warfarin - Pharmacist Dosing Inpatient (not administered)  warfarin (COUMADIN) tablet 5 mg (not administered)  furosemide (LASIX) injection 60 mg (60 mg Intravenous Given 08/14/16 1311)     Initial Impression / Assessment and Plan / ED Course  I have reviewed the triage vital signs and the nursing notes.  Pertinent labs & imaging results that were available during my care of the patient were reviewed by me and considered in my medical decision making (see chart for details).    76 year old male with a history of AAA, atrial fibrillation, coronary artery disease, CKD, renal cell carcinoma hyperlipidemia, hypertension presents from PCPs office with concern for confusion, hallucinations. Patient also reports some dyspnea, increased leg swelling. EKG shows atrial fibrillation with LBBB, which is new from prior. No acute chest pain.  X-ray shows increased pulmonary vascularity, pleural effusions. BNP is 800, and patient is requiring 2 L of oxygen. Suspect this is likely secondary to congestive heart failure exacerbation. Patient was given 60 mg of IV Lasix, and will be admitted to the hospitalist for further care.  Regarding his confusion, concern for hallucinations-- CT head shows no  acute abnormalities. Discussed with hospitalist, and recommend MRI as inpatient to further evaluate for signs of metastases. CVA, or other concerns. Patient without focal neurologic deficits on exam, is alert and oriented readily x4.   Patient [facility] to [facility] in [facility] condition.     Final Clinical Impressions(s) / ED Diagnoses   Final diagnoses:  Confusion  Hallucinations  Hypoxia  Acute on chronic congestive heart failure, unspecified congestive heart failure type [facility])    New Prescriptions Current Discharge Medication List       Gareth Morgan, MD 08/14/16 1825

## 2016-08-14 NOTE — ED Notes (Signed)
Report given to receiving nurse at [facility] [place]

## 2016-08-14 NOTE — Progress Notes (Signed)
Subjective:    Patient ID: Mark [name], male    DOB: Sep 13, 1940, 76 y.o.   MRN: [id number]  HPI  Mark [name] is a 76 yr old male who presents today with several concerns. He is accompanied by his ex-wife who assists with the history.   1) altered mental status-  Patient is unable to say when his confusion began but notes "probably a few weeks ago." He apparently contacted his ex-wife yesterday for help because he realized he was confused. She asked where he was and he told her that he was at her house but in fact, he was at his house.  Ex wife notes that his speech is different.  She also notes confusion.  He reports + visual hallucinations.  This AM thought he was in a hotel. Reports that he was getting ready to go on a golf trip, which is not true. Denies dysuria, fever, urinary frequency, low back pain. Does report anorexia. Has had an 18 pound unintentional weight loss since November. He does report some SOB.   2) Aortic Aneurysm- did not complete contrasted CT due to cost. Requesting [place] instead as this will be less expensive for him.    Review of Systems    see HPI  Past Medical History:  Diagnosis Date  . AAA (abdominal aortic aneurysm) (HCC)    9.1 cm repair 05/2009 ( ENDOVASCULAR REPAIR ).  MORE RECENT RE-EVALUATION OF ENLARGING AAA BY DR. Trula Slade AND BY DR. Kathlene Cote  MAY 2015 - PT STATES HE UNDERSTANDS THAT NOTHING IS LEAKING AT PRESENT TIME AND HE IS TO FOLLOW UP WITH HIS DOCTORS FOR FUTURE MONITORING I  . Arthritis    LEFT HAND, RT KNEE  . Atrial fibrillation ([place])   . CAD (coronary artery disease)   . Cancer (HCC)    BASAL CELL CA OF NOSE  . Chronic kidney disease    CHRONIC RENAL INSUFFICIENCY  . Depression   . Dysrhythmia    AF  . GERD (gastroesophageal reflux disease)   . History of nephrolithiasis   . History of pneumothorax    LONG TIME AGO  . History of skin cancer   . Hyperlipidemia   . Hypertension   . Myocardial infarction 2006  . Renal cell carcinoma  ([place])   . Shingles outbreak    PT NOTICED SKIN RASH RT GROIN AND RIGHT TRUNK ON MONDAY 10/27/13 - NOT A LOT OF DISCOMFORT- NOT ON ANY ORAL MEDS TO TREAT.  Marland Kitchen Shortness of breath      Social History   Social History  . Marital status: Divorced    Spouse name: N/A  . Number of children: 2  . Years of education: N/A   Occupational History  .  Retired   Social History Main Topics  . Smoking status: Former Smoker    Quit date: 08/24/1980  . Smokeless tobacco: Never Used     Comment: quit   . Alcohol use 1.5 oz/week    3 Standard drinks or equivalent per week     Comment: Occasional  . Drug use: No  . Sexual activity: Not on file   Other Topics Concern  . Not on file   Social History Narrative   Retired   Divorced   2 sons 42 &30     Occupation:  Part time sports broadcaster    Smoking Status:  quit (08/24/1980)   Packs/Day:  1.0    Caffeine use/day:  3 beverages daily    Does Patient  Exercise:  no   Alcohol Use - yes    Past Surgical History:  Procedure Laterality Date  . ABDOMINAL AORTAGRAM N/A 09/24/2013   Procedure: ABDOMINAL Maxcine Ham;  Surgeon: Serafina Mitchell, MD;  Location: [facility] CATH LAB;  Service: Cardiovascular;  Laterality: N/A;  . ABDOMINAL AORTIC ANEURYSM REPAIR  JAN 2011   ENDOVASCULAR - DR. BRABHAM  . Athroscopic knee surgery     RIGHT KNEE  . CARDIAC CATHETERIZATION  06/2004  . LITHOTRIPSY    . NEPHRECTOMY    . ROBOT ASSISTED LAPAROSCOPIC NEPHRECTOMY Left 11/06/2013   Procedure: ROBOTIC ASSISTED LAPAROSCOPIC RADICAL  NEPHRECTOMY;  Surgeon: Alexis Frock, MD;  Location: [facility] ORS;  Service: Urology;  Laterality: Left;  . TONSILLECTOMY      Family History  Problem Relation Age of Onset  . Coronary artery disease    . Hyperlipidemia    . Hypertension    . Prostate cancer    . Pulmonary embolism Mother     died of PE  . Heart disease Mother     before age 46  . Anuerysm Father     history of popliteal  . Other Neg Hx     polycystic kidney disease, and  colon cancer  . Colon cancer Neg Hx     Allergies  Allergen Reactions  . Ace Inhibitors     REACTION: Cough    Current Outpatient Prescriptions on File Prior to Visit  Medication Sig Dispense Refill  . albuterol (PROVENTIL HFA;VENTOLIN HFA) 108 (90 Base) MCG/ACT inhaler Inhale 2 puffs into the lungs every 6 (six) hours as needed for wheezing or shortness of breath. 1 Inhaler 0  . amLODipine (NORVASC) 10 MG tablet Take 1 tablet (10 mg total) by mouth every evening. 90 tablet 1  . atorvastatin (LIPITOR) 40 MG tablet TAKE 1 TABLET ONE TIME DAILY 90 tablet 1  . calcium elemental as carbonate (BARIATRIC TUMS ULTRA) 400 MG chewable tablet     . Coenzyme Q10 200 MG capsule Take 200 mg by mouth daily.    . furosemide (LASIX) 40 MG tablet Take 1 tablet (40 mg total) by mouth 2 (two) times daily. 180 tablet 1  . isosorbide dinitrate (ISORDIL) 20 MG tablet Take 1 tablet (20 mg total) by mouth 3 (three) times daily. 270 tablet 1  . losartan (COZAAR) 50 MG tablet Take 1 tablet (50 mg total) by mouth every evening. 90 tablet 0  . metoprolol (LOPRESSOR) 100 MG tablet Take 1 tablet (100 mg total) by mouth 2 (two) times daily. 180 tablet 1  . Multiple Vitamins-Minerals (MACUVITE EYE CARE PO) Take 1 capsule by mouth daily.    . Omega-3 Fatty Acids (FISH OIL) 1200 MG CAPS Take 1,200 mg by mouth daily.     . ranitidine (ZANTAC) 150 MG tablet Take 1 tablet (150 mg total) by mouth 2 (two) times daily. 180 tablet 1  . sertraline (ZOLOFT) 100 MG tablet Take 1 tablet (100 mg total) by mouth every evening. 90 tablet 1  . tamsulosin (FLOMAX) 0.4 MG CAPS capsule TAKE 1 CAPSULE (0.4 MG TOTAL) DAILY AFTER BREAKFAST. 90 capsule 0  . warfarin (COUMADIN) 5 MG tablet TAKE 1 TABLET EVERY DAY EXCEPT 1.5 TABLETS ON WEDNESDAY. 90 tablet 1   No current facility-administered medications on file prior to visit.     BP (!) 147/80 (BP Location: Right Arm, Cuff Size: Large)   Pulse 97   Temp 98 F (36.7 C) (Oral)   Resp 16    Ht 5'  11" (1.803 m)   Wt 222 lb (100.7 kg)   SpO2 95% Comment: room air  BMI 30.96 kg/m    Objective:   Physical Exam  Constitutional: He is oriented to person, place, and time. He appears well-developed and well-nourished. No distress.  HENT:  Head: Normocephalic and atraumatic.  Dry oropharynx  Eyes: EOM are normal. Pupils are equal, round, and reactive to light.  Cardiovascular: Normal rate and regular rhythm.   No murmur heard. Pulmonary/Chest: Effort normal and breath sounds normal. No respiratory distress. He has no wheezes. He has no rales.  Musculoskeletal: He exhibits no edema.  Neurological: He is alert and oriented to person, place, and time. He exhibits normal muscle tone. Coordination normal.  Skin: Skin is warm and dry.  Psychiatric: He has a normal mood and affect. His behavior is normal. Thought content normal.          Assessment & Plan:  Altered mental status- etiology unclear.  Given his hx of renal cell carcinoma- brain metastasis is in the differential as well as CVA.  With renal insufficiency, acute renal failure is also a possibility.  I have advised pt that I think it would be best for him to be evaluated in the ED.  He verbalizes understanding and is agreeable to go. Report given to ED physician.   AAA- s/p repair- advised pt to schedule a follow up appointment with Dr. Trula Slade to discuss follow up imaging. Advised him that CT would likely be preferred imaging but he declines.   Weight loss- ? Etiology. He has lost 18 pounds unintentionally. Will needs TSH.   40 minutes spent with pt today. >50% of this time was spent counseling patient on his AMS and necessary follow up.

## 2016-08-14 NOTE — Patient Instructions (Addendum)
Please contact Dr. Stephens Shire office to arrange follow up to discuss further imaging of your aorta. Continue current blood pressure medication.  Please proceed to the ER on the first floor.

## 2016-08-14 NOTE — ED Notes (Signed)
ABG drawn on 3.5 l/m O2

## 2016-08-14 NOTE — ED Triage Notes (Signed)
Referred by [facility] from office visit today regarding confusion for ? A few weeks.

## 2016-08-14 NOTE — Progress Notes (Signed)
Patient heart rate up to 158. Patient with afib reading 127-136 at this time.Denies any chest pain.  MD notified.

## 2016-08-14 NOTE — Progress Notes (Signed)
Received pt from [employer], transported by [facility], AOx4 pds of confusion. Sats 87% on room air, placed on 2Lnc-94%

## 2016-08-14 NOTE — Progress Notes (Signed)
Pre visit review using our clinic review tool, if applicable. No additional management support is needed unless otherwise documented below in the visit note. 

## 2016-08-14 NOTE — Progress Notes (Signed)
   Patient coming form [facility] for treatment of hypoxia likely secondary to CHF exacerbation and altered mental status with a waxing and waning type of nature. Etiology of mental status changes is not immediately clear but may be due to hypoxia versus intracranial process given the fact the patient has a history of renal cell carcinoma with known metastatic disease. Patient is attended to by his ex wife. Currently patient is endorsing visual hallucinations. Prior to transfer last that patient have a UDS, ammonia level, and ABG drawn as well as began providing diuresis with IV Lasix. EDP has graciously agreed to do these prior to transfer. Per review of patient's record last echo from June 2017 shows mild diastolic congestive heart failure with an EF of 60%.  Linna Darner, MD [facility] 08/14/2016, 12:21 PM

## 2016-08-14 NOTE — Progress Notes (Signed)
[place] for warfarin Indication: atrial fibrillation  Allergies  Allergen Reactions  . Ace Inhibitors     REACTION: Cough    Patient Measurements: Height: 5\' 11"  (180.3 cm) Weight: 218 lb 5 oz (99 kg) (scale A) IBW/kg (Calculated) : 75.3  Vital Signs: Temp: 97.7 F (36.5 C) (04/09 1433) Temp Source: Oral (04/09 1433) BP: 151/103 (04/09 1433) Pulse Rate: 96 (04/09 1433)  Labs:  Recent Labs  08/14/16 1023 08/14/16 1024 08/14/16 1047  HGB 12.0*  --   --   HCT 36.3*  --   --   PLT 153  --   --   LABPROT  --   --  30.6*  INR  --   --  2.86  CREATININE 2.01*  --   --   TROPONINI  --  <0.03  --     Assessment: 76 yo male admitted 4/9 with confusion. Warfarin prior to admission for atrial fibrillation. INR 2.86 with last dose taken on 4/8. CBC stable  Prior to admission dose 7.5 mg on Wednesday and 5 mg AOD's   Goal of Therapy:  INR 2-3 Monitor platelets by anticoagulation protocol: Yes   Plan:  1. Warfarin 5 mg x 1 this evening 2. Daily INR   Vincenza Hews, PharmD, BCPS 08/14/2016, 6:05 PM   '

## 2016-08-15 ENCOUNTER — Observation Stay (HOSPITAL_COMMUNITY): Payer: Medicare HMO

## 2016-08-15 ENCOUNTER — Observation Stay (HOSPITAL_BASED_OUTPATIENT_CLINIC_OR_DEPARTMENT_OTHER): Payer: Medicare HMO

## 2016-08-15 DIAGNOSIS — I509 Heart failure, unspecified: Secondary | ICD-10-CM

## 2016-08-15 DIAGNOSIS — I714 Abdominal aortic aneurysm, without rupture: Secondary | ICD-10-CM | POA: Diagnosis present

## 2016-08-15 DIAGNOSIS — F0281 Dementia in other diseases classified elsewhere with behavioral disturbance: Secondary | ICD-10-CM | POA: Diagnosis present

## 2016-08-15 DIAGNOSIS — K219 Gastro-esophageal reflux disease without esophagitis: Secondary | ICD-10-CM | POA: Diagnosis present

## 2016-08-15 DIAGNOSIS — Z905 Acquired absence of kidney: Secondary | ICD-10-CM | POA: Diagnosis not present

## 2016-08-15 DIAGNOSIS — Z8042 Family history of malignant neoplasm of prostate: Secondary | ICD-10-CM | POA: Diagnosis not present

## 2016-08-15 DIAGNOSIS — Z888 Allergy status to other drugs, medicaments and biological substances status: Secondary | ICD-10-CM | POA: Diagnosis not present

## 2016-08-15 DIAGNOSIS — C641 Malignant neoplasm of right kidney, except renal pelvis: Secondary | ICD-10-CM | POA: Diagnosis not present

## 2016-08-15 DIAGNOSIS — G931 Anoxic brain damage, not elsewhere classified: Secondary | ICD-10-CM | POA: Diagnosis present

## 2016-08-15 DIAGNOSIS — N400 Enlarged prostate without lower urinary tract symptoms: Secondary | ICD-10-CM | POA: Diagnosis present

## 2016-08-15 DIAGNOSIS — I5021 Acute systolic (congestive) heart failure: Secondary | ICD-10-CM | POA: Diagnosis not present

## 2016-08-15 DIAGNOSIS — I482 Chronic atrial fibrillation: Secondary | ICD-10-CM | POA: Diagnosis present

## 2016-08-15 DIAGNOSIS — I739 Peripheral vascular disease, unspecified: Secondary | ICD-10-CM | POA: Diagnosis present

## 2016-08-15 DIAGNOSIS — I517 Cardiomegaly: Secondary | ICD-10-CM | POA: Diagnosis not present

## 2016-08-15 DIAGNOSIS — I472 Ventricular tachycardia: Secondary | ICD-10-CM | POA: Diagnosis not present

## 2016-08-15 DIAGNOSIS — R410 Disorientation, unspecified: Secondary | ICD-10-CM | POA: Diagnosis not present

## 2016-08-15 DIAGNOSIS — G309 Alzheimer's disease, unspecified: Secondary | ICD-10-CM | POA: Diagnosis present

## 2016-08-15 DIAGNOSIS — E785 Hyperlipidemia, unspecified: Secondary | ICD-10-CM | POA: Diagnosis present

## 2016-08-15 DIAGNOSIS — I130 Hypertensive heart and chronic kidney disease with heart failure and stage 1 through stage 4 chronic kidney disease, or unspecified chronic kidney disease: Secondary | ICD-10-CM | POA: Diagnosis present

## 2016-08-15 DIAGNOSIS — R443 Hallucinations, unspecified: Secondary | ICD-10-CM | POA: Diagnosis present

## 2016-08-15 DIAGNOSIS — I4891 Unspecified atrial fibrillation: Secondary | ICD-10-CM | POA: Diagnosis not present

## 2016-08-15 DIAGNOSIS — G308 Other Alzheimer's disease: Secondary | ICD-10-CM | POA: Diagnosis not present

## 2016-08-15 DIAGNOSIS — J9621 Acute and chronic respiratory failure with hypoxia: Secondary | ICD-10-CM | POA: Diagnosis present

## 2016-08-15 DIAGNOSIS — F329 Major depressive disorder, single episode, unspecified: Secondary | ICD-10-CM | POA: Diagnosis present

## 2016-08-15 DIAGNOSIS — F05 Delirium due to known physiological condition: Secondary | ICD-10-CM | POA: Diagnosis not present

## 2016-08-15 DIAGNOSIS — G4733 Obstructive sleep apnea (adult) (pediatric): Secondary | ICD-10-CM | POA: Diagnosis present

## 2016-08-15 DIAGNOSIS — I700 Atherosclerosis of aorta: Secondary | ICD-10-CM | POA: Diagnosis not present

## 2016-08-15 DIAGNOSIS — R460 Very low level of personal hygiene: Secondary | ICD-10-CM | POA: Diagnosis not present

## 2016-08-15 DIAGNOSIS — I252 Old myocardial infarction: Secondary | ICD-10-CM | POA: Diagnosis not present

## 2016-08-15 DIAGNOSIS — J449 Chronic obstructive pulmonary disease, unspecified: Secondary | ICD-10-CM | POA: Diagnosis present

## 2016-08-15 DIAGNOSIS — F0390 Unspecified dementia without behavioral disturbance: Secondary | ICD-10-CM | POA: Diagnosis not present

## 2016-08-15 DIAGNOSIS — Z8249 Family history of ischemic heart disease and other diseases of the circulatory system: Secondary | ICD-10-CM | POA: Diagnosis not present

## 2016-08-15 DIAGNOSIS — E876 Hypokalemia: Secondary | ICD-10-CM | POA: Diagnosis present

## 2016-08-15 DIAGNOSIS — J90 Pleural effusion, not elsewhere classified: Secondary | ICD-10-CM | POA: Diagnosis not present

## 2016-08-15 DIAGNOSIS — F4320 Adjustment disorder, unspecified: Secondary | ICD-10-CM | POA: Diagnosis not present

## 2016-08-15 DIAGNOSIS — I5043 Acute on chronic combined systolic (congestive) and diastolic (congestive) heart failure: Secondary | ICD-10-CM | POA: Diagnosis present

## 2016-08-15 DIAGNOSIS — Z87891 Personal history of nicotine dependence: Secondary | ICD-10-CM | POA: Diagnosis not present

## 2016-08-15 DIAGNOSIS — Z8673 Personal history of transient ischemic attack (TIA), and cerebral infarction without residual deficits: Secondary | ICD-10-CM | POA: Diagnosis not present

## 2016-08-15 DIAGNOSIS — R0902 Hypoxemia: Secondary | ICD-10-CM | POA: Diagnosis not present

## 2016-08-15 DIAGNOSIS — G9340 Encephalopathy, unspecified: Secondary | ICD-10-CM | POA: Diagnosis present

## 2016-08-15 DIAGNOSIS — I447 Left bundle-branch block, unspecified: Secondary | ICD-10-CM | POA: Diagnosis present

## 2016-08-15 DIAGNOSIS — N184 Chronic kidney disease, stage 4 (severe): Secondary | ICD-10-CM | POA: Diagnosis present

## 2016-08-15 DIAGNOSIS — I2510 Atherosclerotic heart disease of native coronary artery without angina pectoris: Secondary | ICD-10-CM | POA: Diagnosis present

## 2016-08-15 LAB — BASIC METABOLIC PANEL
Anion gap: 10 (ref 5–15)
BUN: 24 mg/dL — AB (ref 6–20)
CHLORIDE: 108 mmol/L (ref 101–111)
CO2: 23 mmol/L (ref 22–32)
CREATININE: 2.05 mg/dL — AB (ref 0.61–1.24)
Calcium: 8.6 mg/dL — ABNORMAL LOW (ref 8.9–10.3)
GFR calc Af Amer: 35 mL/min — ABNORMAL LOW (ref >60)
GFR calc non Af Amer: 30 mL/min — ABNORMAL LOW (ref >60)
GLUCOSE: 99 mg/dL (ref 65–99)
Potassium: 3.3 mmol/L — ABNORMAL LOW (ref 3.5–5.1)
SODIUM: 141 mmol/L (ref 135–145)

## 2016-08-15 LAB — CBC
HEMATOCRIT: 35.4 % — AB (ref 39.0–52.0)
Hemoglobin: 11.5 g/dL — ABNORMAL LOW (ref 13.0–17.0)
MCH: 30.6 pg (ref 26.0–34.0)
MCHC: 32.5 g/dL (ref 30.0–36.0)
MCV: 94.1 fL (ref 78.0–100.0)
Platelets: 143 10*3/uL — ABNORMAL LOW (ref 150–400)
RBC: 3.76 MIL/uL — ABNORMAL LOW (ref 4.22–5.81)
RDW: 16.7 % — AB (ref 11.5–15.5)
WBC: 6.6 10*3/uL (ref 4.0–10.5)

## 2016-08-15 LAB — BLOOD GAS, ARTERIAL
Acid-base deficit: 1 mmol/L (ref 0.0–2.0)
BICARBONATE: 23 mmol/L (ref 20.0–28.0)
DRAWN BY: 41308
FIO2: 50
O2 SAT: 90.7 %
PATIENT TEMPERATURE: 98.6
PO2 ART: 64.7 mmHg — AB (ref 83.0–108.0)
pCO2 arterial: 37.5 mmHg (ref 32.0–48.0)
pH, Arterial: 7.406 (ref 7.350–7.450)

## 2016-08-15 LAB — PROTIME-INR
INR: 2.96
PROTHROMBIN TIME: 31.5 s — AB (ref 11.4–15.2)

## 2016-08-15 LAB — RPR: RPR Ser Ql: NONREACTIVE

## 2016-08-15 LAB — GLUCOSE, CAPILLARY: GLUCOSE-CAPILLARY: 92 mg/dL (ref 65–99)

## 2016-08-15 LAB — ECHOCARDIOGRAM COMPLETE
HEIGHTINCHES: 71.5 in
Weight: 3456.81 oz

## 2016-08-15 LAB — MRSA PCR SCREENING: MRSA BY PCR: NEGATIVE

## 2016-08-15 LAB — HIV ANTIBODY (ROUTINE TESTING W REFLEX): HIV Screen 4th Generation wRfx: NONREACTIVE

## 2016-08-15 MED ORDER — ALBUTEROL SULFATE (2.5 MG/3ML) 0.083% IN NEBU
INHALATION_SOLUTION | RESPIRATORY_TRACT | Status: AC
  Administered 2016-08-15: 2.5 mg
  Filled 2016-08-15: qty 3

## 2016-08-15 MED ORDER — IPRATROPIUM-ALBUTEROL 0.5-2.5 (3) MG/3ML IN SOLN: 2.5000 mL | Freq: Once | RESPIRATORY_TRACT | Status: AC

## 2016-08-15 MED ORDER — NITROGLYCERIN 2 % TD OINT
0.5000 [in_us] | TOPICAL_OINTMENT | Freq: Once | TRANSDERMAL | Status: AC
  Administered 2016-08-15: 0.5 [in_us] via TOPICAL
  Filled 2016-08-15 (×2): qty 30

## 2016-08-15 MED ORDER — WARFARIN SODIUM 2.5 MG PO TABS
2.5000 mg | ORAL_TABLET | Freq: Once | ORAL | Status: AC
  Administered 2016-08-15: 2.5 mg via ORAL
  Filled 2016-08-15: qty 1

## 2016-08-15 NOTE — Progress Notes (Signed)
PROGRESS NOTE    Mark DAVEE  LPF:[id number] DOB: 15-Sep-1940 DOA: 08/14/2016 PCP: Nance Pear., NP   Outpatient Specialists:     Brief Narrative:   Mark [name] is a 76 y.o. male with medical history significant of AAA, atrial fibrillation, coronary artery disease, C KD, GERD, renal cell carcinoma status post nephrectomy, hypertension, presenting with two-week history of confusion. Level V caveat applies his history is limited due to patient's mental status and is obtained only in part by family members. Patient endorses "weird dreams " with reoccurring vivid dreams but always tired to reality such as curtains in his house or an every dreams about her where he is. Patient states that he realizes that these are unreal experiences. he is making bizarre phone calls to family starting as far back as 2 weeks. One day ago patient called patient's ex wife and thought he was at his wife's house when all reality he was at his own house. Patient denies any dysuria, frequency, chest pain, palpitations, nausea, vomiting, coughing, fevers, neck stiffness, headache, focal neurological deficit. Patient does endorse increasing dyspnea on exertion over the last 3 months but denies lower extremity swelling.    Assessment & Plan:   Active Problems:   BPH (benign prostatic hyperplasia)   Atrial fibrillation, chronic (HCC)   AAA (abdominal aortic aneurysm) without rupture (HCC)   Renal cell carcinoma (HCC)   Acute encephalopathy   Acute on chronic respiratory failure with hypoxia (HCC)   Acute encephalopathy:  -predominantly hallucinations and vivid dreams -Hypoxemia vs intracranial process not yet visualized- MRI pending -CT head w/o acute process -UDS negative- ? Alcohol use -h/o renal cell cancer w/ vague possibility of mets per EDP report. No significant metabolic derangements. Question psych etiology. No sedating medications.  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 Dementia- think MRI will provide needed date-- unfortunately not able to do with contrast due to CKD  Acute respiratory failure: likely secondary to diastolic CHF. Previous echo w/ nml EF and evidence of diastolic chf. CXR w/o evidence of infectious process. BNP 84.  Troponin.01. Lasix 60 IV x1 in ED at [facility]. ABG pH 7.4, PCO2 29, P O2 64, CO2 21.  - Lasix 40 IV BID - Echo.  - Wean O2 as able.   CKD: Cr 2.0. Near baseline. s/p nephrectomy - BMET daily  Mild elevation of bilirubin -CMP in AM  HTN: - continue losartan, norvasc, metop, isosorbide  BPH: - continue flomax  GERD: - contineu pepcid  Depression:  - continue zoloft- no change in dose per patient  HLD: - continue lipitor  Afib: - tele - continue bblocker, coumadin -it appears from review of chart that patient was not compliant with getting INR checked as directed  AAA: - f/u outpt. Pt due for his yearly appt and imaging.    DVT prophylaxis:  Fully anticoagulated   Code Status: Full Code   Family Communication: Ex-wife at bedside  Disposition Plan:     Consultants:        Subjective: Patient has episode of confusion last PM with hypoxemia- transferred to SDU  Objective: Vitals:   08/15/16 0345 08/15/16 0430 08/15/16 0716 08/15/16 0900  BP: (!) 131/94 98/67 (!) 147/131 138/89  Pulse: (!) 50 (!) 106 93 97  Resp: (!) 22 19 20  (!) 22  Temp:   97.2 F (36.2 C)   TempSrc:   Oral   SpO2: (!) 80% 91% (!) 89% 93%  Weight:      Height:        Intake/Output Summary (Last 24 hours) at 08/15/16 1245 Last data filed at 08/15/16 0400  Gross per 24 hour  Intake              340 ml  Output             1310 ml  Net              -970 ml   Filed Weights   08/14/16 1326 08/14/16 1433 08/15/16 0300  Weight: 101.6 kg (224 lb) 99 kg (218 lb 5 oz) 98 kg (216 lb 0.8 oz)    Examination:  General exam: awake, appropriate-- ex wife says he appears jaundiced Respiratory system: diminished, no current wheezing Cardiovascular system: S1 & S2 heard, RRR. No JVD, murmurs, rubs, gallops or clicks. No pedal edema. Gastrointestinal system: Abdomen is nondistended, soft and nontender. No organomegaly or masses felt. Normal bowel sounds heard. Central nervous system: Alert and oriented. No focal neurological deficits.  Extremities: Symmetric 5 x 5 power.     Data Reviewed: I have personally reviewed following labs and imaging studies  CBC:  Recent Labs Lab 08/14/16 1023 08/15/16 0329  WBC 4.9 6.6  NEUTROABS 3.8  --   HGB 12.0* 11.5*  HCT 36.3* 35.4*  MCV 95.0 94.1  PLT 153 774*   Basic Metabolic Panel:  Recent Labs Lab 08/14/16 1023 08/14/16 1757 08/15/16 0329  NA 142  --  141  K 3.3*  --  3.3*  CL 110  --  108  CO2 24  --  23  GLUCOSE 96  --  99  BUN 25*  --  24*  CREATININE 2.01*  --  2.05*  CALCIUM 8.8*  --  8.6*  MG  --  2.1  --   PHOS  --  3.3  --    GFR: Estimated Creatinine Clearance: 38.6 mL/min (A) (by C-G formula based on SCr of 2.05 mg/dL (H)). Liver Function Tests:  Recent Labs Lab 08/14/16 1023  AST 20  ALT 14*  ALKPHOS 77  BILITOT 1.7*  PROT 6.4*  ALBUMIN 3.6   No results for input(s): LIPASE, AMYLASE in the last 168 hours.  Recent Labs Lab 08/14/16 1303  AMMONIA 13   Coagulation Profile:  Recent Labs Lab 08/09/16 1655 08/14/16 1047 08/14/16 1757 08/15/16 0329  INR 2.6 2.86 2.80 2.96   Cardiac Enzymes:  Recent Labs Lab 08/14/16 1024 08/14/16 1757 08/14/16 2257  TROPONINI <0.03 <0.03 <0.03   BNP (last 3 results) No results for input(s): PROBNP in the last 8760 hours. HbA1C: No results for input(s): HGBA1C in the last 72 hours. CBG:  Recent Labs Lab  08/15/16 0234  GLUCAP 92   Lipid Profile: No results for input(s): CHOL, HDL, LDLCALC, TRIG, CHOLHDL, LDLDIRECT in the last 72 hours. Thyroid Function Tests:  Recent Labs  08/14/16 1757  TSH 2.556   Anemia Panel:  Recent Labs  08/14/16 1757  VITAMINB12 678   Urine analysis:    Component Value Date/Time   COLORURINE YELLOW 08/14/2016 [place] 08/14/2016 1142   LABSPEC 1.018 08/14/2016 1142   PHURINE 5.5 08/14/2016 1142   GLUCOSEU NEGATIVE 08/14/2016 1142   GLUCOSEU NEGATIVE 07/17/2014  [place] 08/14/2016 1142   BILIRUBINUR NEGATIVE 08/14/2016 1142   KETONESUR 15 (A) 08/14/2016 1142   PROTEINUR 100 (A) 08/14/2016 1142   UROBILINOGEN 0.2 07/17/2014 0945   NITRITE NEGATIVE 08/14/2016 1142   LEUKOCYTESUR NEGATIVE 08/14/2016 1142      Recent Results (from the past 240 hour(s))  MRSA PCR Screening     Status: None   Collection Time: 08/15/16  3:21 AM  Result Value Ref Range Status   MRSA by PCR NEGATIVE NEGATIVE Final    Comment:        The GeneXpert MRSA Assay (FDA approved for NASAL specimens only), is one component of a comprehensive MRSA colonization surveillance program. It is not intended to diagnose MRSA infection nor to guide or monitor treatment for MRSA infections.       Anti-infectives    None       Radiology Studies: Dg Chest 2 View  Result Date: 08/14/2016 CLINICAL DATA:  Cough and chest congestion.  Altered mental status. EXAM: CHEST  2 VIEW COMPARISON:  01/05/2016 FINDINGS: The heart size normal. Pulmonary vascularity is at the upper limits of normal. The patient has new bilateral small pleural effusions. Extensive calcification in the tortuous thoracic aorta. No discrete pulmonary edema. No acute bone abnormality. IMPRESSION: New small bilateral pleural effusions. Pulmonary vascularity is at the upper limits of normal. Electronically Signed   By: Lorriane Shire M.D.   On: 08/14/2016 11:16   Ct Head Wo  Contrast  Result Date: 08/14/2016 CLINICAL DATA:  76 year old hypertensive male altered mental status. Initial encounter. EXAM: CT HEAD WITHOUT CONTRAST TECHNIQUE: Contiguous axial images were obtained from the base of the skull through the vertex without intravenous contrast. COMPARISON:  None. FINDINGS: Brain: No intracranial hemorrhage. Prominent chronic microvascular changes and remote bilateral basal ganglia infarcts without CT evidence of large acute infarct. Mild global atrophy without hydrocephalus. No intracranial mass lesion noted on this unenhanced exam. Vascular: Vascular calcifications. Skull: No acute abnormality. Sinuses/Orbits: No acute orbital abnormality. Visualized paranasal sinuses are clear. Other: Negative IMPRESSION: Prominent chronic microvascular changes and remote bilateral basal ganglia infarcts without CT evidence of large acute infarct. No intracranial hemorrhage. Mild global atrophy. Electronically Signed   By: Genia Del M.D.   On: 08/14/2016 11:27   Dg Chest Port 1 View  Result Date: 08/15/2016 CLINICAL DATA:  Dyspnea at rest EXAM: PORTABLE CHEST 1 VIEW COMPARISON:  08/14/2016 FINDINGS: Stable cardiomegaly with aortic atherosclerosis. Mild interstitial pulmonary edema with layering bilateral effusions right slightly greater than left. No acute osseous abnormality. IMPRESSION: Interstitial pulmonary edema with layering bilateral effusions right greater than left. Stable cardiomegaly with aortic atherosclerosis. Electronically Signed   By: Ashley Royalty M.D.   On: 08/15/2016 02:40        Scheduled Meds: . amLODipine  10 mg Oral QPM  . atorvastatin  40 mg Oral q1800  . famotidine  20 mg Oral Daily  . feeding supplement (ENSURE ENLIVE)  237 mL Oral BID BM  . furosemide  40 mg Intravenous BID  . isosorbide dinitrate  20 mg Oral TID  . losartan  50 mg Oral QPM  . metoprolol  100 mg Oral BID  . sertraline  100 mg Oral QPM  . sodium chloride flush  3 mL Intravenous Q12H   . tamsulosin  0.4 mg Oral Daily  . warfarin  2.5 mg Oral ONCE-1800  . Warfarin - Pharmacist Dosing Inpatient   Does not apply q1800   Continuous Infusions:  LOS: 0 days    Time spent: 35 min    [place], DO [facility] Pager [phone]  If 7PM-7AM, please contact night-coverage [email or web address] Password [email or web address] 08/15/2016, 12:45 PM

## 2016-08-15 NOTE — Progress Notes (Signed)
Initial Nutrition Assessment  DOCUMENTATION CODES:   Not applicable  INTERVENTION:   -Continue Ensure Enlive po BID, each supplement provides 350 kcal and 20 grams of protein  NUTRITION DIAGNOSIS:   Predicted suboptimal nutrient intake related to other (see comment) (confusion) as evidenced by percent weight loss.  GOAL:   Patient will meet greater than or equal to 90% of their needs  MONITOR:   PO intake, Supplement acceptance, Labs, Weight trends, Skin, I & O's  REASON FOR ASSESSMENT:   Malnutrition Screening Tool    ASSESSMENT:   Mark [name] is a 76 y.o. male with medical history significant of AAA, atrial fibrillation, coronary artery disease, C KD, GERD, renal cell carcinoma status post nephrectomy, hypertension, presenting with two-week history of confusion.   Pt admitted with acute encephalopathy.   Pt was transferred to SDU due to rapid response as a result of SOB.   Hx obtained from pt; noted pt answered questions some inappropriately and was tangential during parts of interview. He reports he typically has a good appetite and consumes 3 meals per day (Breakfast of omelette, lunch: sandwich, dinner: meat, starch, and vegetable). Pt reports that he has been trying to consume a wider variety of vegetables lately. He complains of early satiety and now is only eating about 50% of how much he typically eats, which has been occurring over the past 3-4 weeks.   Pt reports he has been noticing wt loss over the past 3-4 weeks as well. Wt verified on bedscale at 219#; pt stated "oh good, that's what I always weigh". However, noted pt has experienced a 8.8% wt loss over the past 6 months.   Nutrition-Focused physical exam completed. Findings are no fat depletion, no muscle depletion, and no edema. Pt reports muscle wasting in forearm area, but states "it's because I'm getting old".   Suspect intake may suboptimal related to pt's confusion and weight loss is concerning. RD  will continue order for Ensure supplements.   Case discussed with RN, who reports suspected dementia.   Labs reviewed: K: 3.3.   Diet Order:  Diet Heart Room service appropriate? Yes; Fluid consistency: Thin  Skin:  Reviewed, no issues  Last BM:  08/12/16  Height:   Ht Readings from Last 1 Encounters:  08/15/16 5' 11.5" (1.816 m)    Weight:   Wt Readings from Last 1 Encounters:  08/15/16 216 lb 0.8 oz (98 kg)    Ideal Body Weight:  79.5 kg  BMI:  Body mass index is 29.71 kg/m.  Estimated Nutritional Needs:   Kcal:  1800-2000  Protein:  85-100 grams  Fluid:  1.8-2.0 L  EDUCATION NEEDS:   Education needs no appropriate at this time  Bibiana Gillean A. Jimmye Norman, RD, LDN, CDE Pager: [phone] After hours Pager: [phone]

## 2016-08-15 NOTE — Progress Notes (Signed)
  Echocardiogram 2D Echocardiogram has been performed.  Bobbye Charleston 08/15/2016, 3:52 PM

## 2016-08-15 NOTE — Progress Notes (Signed)
Bed alarm in place. Bed in low position.

## 2016-08-15 NOTE — Significant Event (Signed)
Rapid Response Event Note  Called by Christina,RN for pt with O2 sats 86-88 on 5 l [place].  Pt had received Ativan 1 mg IV at 2145 for agitation and has been confused and lethargic since.  Overview: Time Called: 0147 Arrival Time: 0150 Event Type: Respiratory  Initial Focused Assessment: At bedside pt opens eyes to voice, is oriented to self only, follows commands, drifts off to sleep during interview.  BIlateral BS clear in upper lobes bilaterally , diminished in bases.  RR 24 with mild increased WOB.  133/98  97.7   90% on 5l [place] Denies Chest pain. Cardiac monitor  Afib rate 97  Interventions: Placed on  Venti mask 40% with O2 with O2 sats 94% Albuterol 2.5 mg neb.  Stat port CXR: Interstitial  pulmonary edema with layered bilateral effusions right> left  Transfer to SDU per Dr Ara Kussmaul  Plan of Care (if not transferred): 0245 Pt becoming increasing ly more lethargic.  ABG ordered ABG:  7.4  CO2 37.5  PO2  65  Bicarb 23 called to  Dr Ara Kussmaul      Event Summary: Name of Physician Notified: Dr Ara Kussmaul at [place]    at    Outcome: Transferred (Comment) (SDU) [phone] with  cardiac monitor and O2 at 12 L 50% VM  Hand off report to Trilby Drummer, Therapist, sports at [employer], RN to notify family of pt's transfer and POC    Gifford Ballon, Gust Brooms

## 2016-08-15 NOTE — Progress Notes (Signed)
Patient O2 at 89% 2L. 95% at 5-6L.Respiratory therapiat  at bedside. HR 65-112. MD notified. Patient confused.

## 2016-08-15 NOTE — Progress Notes (Signed)
Patient still desating at 86% O2 at [facility] nurse,  Rapid response, MD notifed. Portable chest X-ray stat and Duoneb 2.5 ordered. Respiratory therapist at bedside.Venturi mask at 40%  Patient breathing using accessory muscles . Patient alert but confused. Follows commands. MD ordered to transfer the patient to step- down unit.

## 2016-08-15 NOTE — Progress Notes (Signed)
Paula, Rapid response nurse at bedside at this time. Waiting for bed placement.

## 2016-08-15 NOTE — Progress Notes (Signed)
[place] for warfarin Indication: atrial fibrillation  Allergies  Allergen Reactions  . Ace Inhibitors     REACTION: Cough    Patient Measurements: Height: 5' 11.5" (181.6 cm) Weight: 216 lb 0.8 oz (98 kg) IBW/kg (Calculated) : 76.45  Vital Signs: Temp: 97.2 F (36.2 C) (04/10 0716) Temp Source: Oral (04/10 0716) BP: 138/89 (04/10 0900) Pulse Rate: 97 (04/10 0900)  Labs:  Recent Labs  08/14/16 1023 08/14/16 1024 08/14/16 1047 08/14/16 1757 08/14/16 2257 08/15/16 0329  HGB 12.0*  --   --   --   --  11.5*  HCT 36.3*  --   --   --   --  35.4*  PLT 153  --   --   --   --  143*  APTT  --   --   --  52*  --   --   LABPROT  --   --  30.6* 30.1*  --  31.5*  INR  --   --  2.86 2.80  --  2.96  CREATININE 2.01*  --   --   --   --  2.05*  TROPONINI  --  <0.03  --  <0.03 <0.03  --     Assessment: 76 yo male admitted 4/9 with confusion. Warfarin prior to admission for atrial fibrillation. INR 2.9 this morning. No bleeding issues noted. RR called overnight for increased confusion and lethargy and decreased oxygen saturations.  Prior to admission dose 7.5 mg on Wednesday and 5 mg AOD's   Goal of Therapy:  INR 2-3 Monitor platelets by anticoagulation protocol: Yes   Plan:  1. Warfarin 2.5 mg today to allow INR to drift down 2. Daily INR for now  Erin Hearing PharmD., [facility] Clinical Pharmacist Pager [phone] 08/15/2016 10:31 AM

## 2016-08-15 NOTE — Progress Notes (Signed)
MRI negative.  ? If hallucinations are from hypoxia vs dementia (worsening).  Spoke with neuro-- ? Withdrawal (patient and family deny), doubt seizures but will get EEG if has another episode, no sign of infection.  He suggested psych consult. Will place order.   Mark [name]

## 2016-08-15 NOTE — Progress Notes (Signed)
ABG gas done.

## 2016-08-15 NOTE — Progress Notes (Signed)
CBG- 92 

## 2016-08-15 NOTE — Progress Notes (Signed)
Patient transferred to 4N 1 with 2 RN and RRT at bedside. Report given prior to transfer. Patient son Thurmond Butts notified.

## 2016-08-15 NOTE — Care Management Obs Status (Signed)
[place] NOTIFICATION   Patient Details  Name: Mark [name] MRN: [id number] Date of Birth: 03/25/1941   Medicare Observation Status Notification Given:  Yes    Carles Collet, RN 08/15/2016, 4:39 PM

## 2016-08-15 NOTE — Progress Notes (Signed)
Patient resting quietly at this time. O2 at 94% 4L. HR 61-03. MD called. Condom cath in place.  Will continue to monitor.

## 2016-08-16 ENCOUNTER — Inpatient Hospital Stay (HOSPITAL_COMMUNITY): Payer: Medicare HMO

## 2016-08-16 ENCOUNTER — Encounter (HOSPITAL_COMMUNITY): Payer: Self-pay

## 2016-08-16 DIAGNOSIS — R0902 Hypoxemia: Secondary | ICD-10-CM

## 2016-08-16 DIAGNOSIS — I5023 Acute on chronic systolic (congestive) heart failure: Secondary | ICD-10-CM

## 2016-08-16 DIAGNOSIS — G4733 Obstructive sleep apnea (adult) (pediatric): Secondary | ICD-10-CM

## 2016-08-16 DIAGNOSIS — I5021 Acute systolic (congestive) heart failure: Secondary | ICD-10-CM

## 2016-08-16 DIAGNOSIS — I482 Chronic atrial fibrillation: Secondary | ICD-10-CM

## 2016-08-16 DIAGNOSIS — G9340 Encephalopathy, unspecified: Secondary | ICD-10-CM

## 2016-08-16 LAB — COMPREHENSIVE METABOLIC PANEL
ALBUMIN: 3.2 g/dL — AB (ref 3.5–5.0)
ALK PHOS: 66 U/L (ref 38–126)
ALT: 11 U/L — ABNORMAL LOW (ref 17–63)
AST: 18 U/L (ref 15–41)
Anion gap: 12 (ref 5–15)
BILIRUBIN TOTAL: 1.2 mg/dL (ref 0.3–1.2)
BUN: 24 mg/dL — AB (ref 6–20)
CO2: 23 mmol/L (ref 22–32)
Calcium: 8.5 mg/dL — ABNORMAL LOW (ref 8.9–10.3)
Chloride: 106 mmol/L (ref 101–111)
Creatinine, Ser: 2.01 mg/dL — ABNORMAL HIGH (ref 0.61–1.24)
GFR calc Af Amer: 36 mL/min — ABNORMAL LOW (ref >60)
GFR calc non Af Amer: 31 mL/min — ABNORMAL LOW (ref >60)
GLUCOSE: 95 mg/dL (ref 65–99)
POTASSIUM: 3.3 mmol/L — AB (ref 3.5–5.1)
Sodium: 141 mmol/L (ref 135–145)
TOTAL PROTEIN: 5.7 g/dL — AB (ref 6.5–8.1)

## 2016-08-16 LAB — PROTIME-INR
INR: 3.15
PROTHROMBIN TIME: 33 s — AB (ref 11.4–15.2)

## 2016-08-16 LAB — SEDIMENTATION RATE: Sed Rate: 18 mm/hr — ABNORMAL HIGH (ref 0–16)

## 2016-08-16 MED ORDER — FUROSEMIDE 10 MG/ML IJ SOLN
60.0000 mg | Freq: Two times a day (BID) | INTRAMUSCULAR | Status: DC
  Administered 2016-08-16 – 2016-08-18 (×5): 60 mg via INTRAVENOUS
  Filled 2016-08-16 (×5): qty 6

## 2016-08-16 MED ORDER — TRAZODONE HCL 50 MG PO TABS
50.0000 mg | ORAL_TABLET | Freq: Every day | ORAL | Status: DC
  Administered 2016-08-16 – 2016-08-21 (×6): 50 mg via ORAL
  Filled 2016-08-16 (×6): qty 1

## 2016-08-16 MED ORDER — DEXTROSE 5 % IV SOLN
2.0000 g | INTRAVENOUS | Status: DC
  Administered 2016-08-16: 2 g via INTRAVENOUS
  Filled 2016-08-16 (×2): qty 2

## 2016-08-16 MED ORDER — POTASSIUM CHLORIDE CRYS ER 20 MEQ PO TBCR
40.0000 meq | EXTENDED_RELEASE_TABLET | Freq: Once | ORAL | Status: AC
Start: 2016-08-16 — End: 2016-08-16
  Administered 2016-08-16: 40 meq via ORAL
  Filled 2016-08-16: qty 2

## 2016-08-16 NOTE — Procedures (Signed)
ELECTROENCEPHALOGRAM REPORT  Date of Study: 08/16/2016  Patient's Name: Mark [name] MRN: [id number] Date of Birth: Sep 28, 1940  Referring Provider: Dr. Eulogio Bear  Clinical History: This is a 76 year old man with altered mental status.  Medications: acetaminophen (TYLENOL) tablet 650 mg  atorvastatin (LIPITOR) tablet 40 mg  famotidine (PEPCID) tablet 20 mg  feeding supplement (ENSURE ENLIVE) (ENSURE ENLIVE) liquid 237 mL  furosemide (LASIX) injection 60 mg  isosorbide dinitrate (ISORDIL) tablet 20 mg  losartan (COZAAR) tablet 50 mg  metoprolol (LOPRESSOR) tablet 100 mg  sertraline (ZOLOFT) tablet 100 mg  tamsulosin (FLOMAX) capsule 0.4 mg  traZODone (DESYREL) tablet 50 mg   Technical Summary: A multichannel digital EEG recording measured by the international 10-20 system with electrodes applied with paste and impedances below 5000 ohms performed as portable with EKG monitoring in an awake and asleep patient.  Hyperventilation and photic stimulation were not performed.  The digital EEG was referentially recorded, reformatted, and digitally filtered in a variety of bipolar and referential montages for optimal display.   Description: The patient is awake and asleep during the recording.  During maximal wakefulness, there is a symmetric, medium voltage 7-7.5 Hz posterior dominant rhythm that poorly attenuates with eye opening and eye closure. This is admixed with a small amount of diffuse 4-5 Hz theta and 2-3 Hz delta slowing of the waking background.  During drowsiness and sleep, there is an increase in theta and delta slowing of the background with rare poorly formed vertex waves and sleep spindles were seen.  Hyperventilation and photic stimulation were not performed.  There were no epileptiform discharges or electrographic seizures seen.    EKG lead showed irregular rhythm.  Impression: This awake and asleep EEG is abnormal due to mild diffuse slowing of the waking  background.  Clinical Correlation of the above findings indicates diffuse cerebral dysfunction that is non-specific in etiology and can be seen with hypoxic/ischemic injury, toxic/metabolic encephalopathies, neurodegenerative disorders, medication effect, or due to excessive drowsiness.  The absence of epileptiform discharges does not rule out a clinical diagnosis of epilepsy.  Clinical correlation is advised.   Ellouise Newer, M.D.

## 2016-08-16 NOTE — Progress Notes (Addendum)
RT made aware patient need for CPAP machine

## 2016-08-16 NOTE — Progress Notes (Signed)
Patient seen by cardiology-- suggest work up for [place] Illness/encephalitis.  MRI negative.  EEG pending.  ID to see in AM- will get lyme antibody and start IV ceftriaxone as if is lyme, will correct with treatment.  Eulogio Bear

## 2016-08-16 NOTE — Care Management Note (Signed)
Case Management Note  Patient Details  Name: NAJEEB UPTAIN MRN: [id number] Date of Birth: [birth year]  Subjective/Objective:    Pt admitted with AMS - hallucinations                Action/Plan:   Pt off unit for procedure and therefore not assessed.  Per the chart - pt needs sleep study - CM placed order form on shadow chart and requested completion via sticky note.  Pt will likely have pysch consult.  CM will continue to follow for discharge needs   Expected Discharge Date:                  Expected Discharge Plan:     In-House Referral:     Discharge planning Services  CM Consult  Post Acute Care Choice:    Choice offered to:     DME Arranged:    DME Agency:     HH Arranged:    HH Agency:     Status of Service:  In process, will continue to follow  If discussed at Long Length of Stay Meetings, dates discussed:    Additional Comments:  Maryclare Labrador, RN 08/16/2016, 3:47 PM

## 2016-08-16 NOTE — Consult Note (Signed)
Cardiology Consultation:   Patient ID: Mark [name]; [id number]; 23-Sep-1940   Admit date: 08/14/2016 Date of Consult: 08/16/2016  Reason for Consultation: decreased EF, LBBB Referring Provider:  Dr. Eliseo Squires  Primary Care Provider: Nance Pear., NP Primary Cardiologist: Dr. Stanford Breed Primary Electrophysiologist:  N/A  Patient Profile:   Mark [name] is a 76 y.o. 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 We are asked to see for decreased EF and atrial fib. Last nuc 2014 was normal, prior EF 55% but now 20-25%, also with new LBBB.  History of Present Illness:   Mark [name] has had intermittent confusion for the last several weeks. He's had reoccurring vivid dreams and also behaving abnormally with bizarre phone calls to family for several weeks and also confusion over his current location and orientation. He saw his PCP for this and was referred to the ED for further evaluation. He was found to be hypoxic and was placed on supplemental O2. CT head showed remote basal ganglia infarcts but no acute intracranial findings; subsequent MRI unremarkable. CXR new small bilateral effusions. Labs notable for troponins negative x3, BNP 863, Cr 2, Tbili 1.7, Hgb 12, INR 2.6, TSH wnl, ammonia wnl, PCO2 wnl, TSH wnl, B12 wnl, RPR negative, UDS wnl. He was also placed on IV Lasix for new CHF. 2D Echo 08/15/16: mild LVH, moderate focal basal septal hypertrophy, EF 20-25% with diffuse HK, mild AI, mildl dilated aortic root 4.6cm, moderate MR, biatrial enlargement, mild RVE, mild TR (prior echo 10/2015 with EF 60-65%, mildly dilated aortic root and  ascending aorta, mildly reduced RV function). HR appears 80s-120s on telemetry, BP labile from 90s-130s. EKG shows atrial fib with new LBBB (prior h/o PVCs and abberrancy but LBBB appears consistent).  He also reports worsening SOB for several weeks. He says he always has some degree of dyspnea but it has been rapidly worsening lately. No chest pain or palpitations. No known syncope. His explanation of his confusion is actually somewhat confusing in itself - states he will be somewhere and find his clothes and then look around and realize he's home. When asked if he knows where he is, he says "no, but I am told I am in [place] somewhere in a [place] facility." He makes errors on the date then corrects himself - 2080 to 2018, said it was October but then said he really meant April.  IM has discussed with neuro, psych consult was suggested - there is question of alcohol use but family and patient have denied.   Past Medical History:  Diagnosis Date  . AAA (abdominal aortic aneurysm) (HCC)    9.1 cm repair 05/2009 ( ENDOVASCULAR REPAIR ).  MORE RECENT RE-EVALUATION OF ENLARGING AAA BY DR. Trula Slade AND BY DR. Kathlene Cote  MAY 2015 - PT STATES HE UNDERSTANDS THAT NOTHING IS LEAKING AT PRESENT TIME AND HE IS TO FOLLOW UP WITH HIS DOCTORS FOR FUTURE MONITORING I  . Arthritis    LEFT HAND, RT KNEE (08/14/2016)  . Basal cell carcinoma    "right side of my nose; think they burned it off" (08/14/2016)  . CAD (coronary artery disease)    a. s/p PCI to LAD/diagonal, subtotal of  PDA 2006. b. Negative nuc 2014.  Marland Kitchen CHF (congestive heart failure) ([place]) 08/14/2016  . Chronic atrial fibrillation ([place])   . CKD (chronic kidney disease), stage III    a. stage III-IV, Cr baseline appears 1.8-2.0  . COPD (chronic obstructive pulmonary disease) ([place])   . Depression   . GERD (gastroesophageal reflux disease)   . History of kidney stones   . History of pneumothorax    LONG TIME AGO  . Hyperlipidemia   . Hypertension    . Migraine    "very rare the last 20 years" (08/14/2016)  . Myocardial infarction 2006  . Osteoarthritis   . Renal cell carcinoma ([place])   . Shingles outbreak    PT NOTICED SKIN RASH RT GROIN AND RIGHT TRUNK ON MONDAY 10/27/13 - NOT A LOT OF DISCOMFORT- NOT ON ANY ORAL MEDS TO TREAT.  . Sleep apnea    "dx'd; didn't try mask" (08/14/2016)  . Stroke [facility])    "CT showed I've had old strokes; never knew about it before today" (08/14/2016)    Past Surgical History:  Procedure Laterality Date  . ABDOMINAL AORTAGRAM N/A 09/24/2013   Procedure: ABDOMINAL Maxcine Ham;  Surgeon: Serafina Mitchell, MD;  Location: [facility] CATH LAB;  Service: Cardiovascular;  Laterality: N/A;  . ABDOMINAL AORTIC ANEURYSM REPAIR  JAN 2011   ENDOVASCULAR - DR. BRABHAM  . CARDIAC CATHETERIZATION  07/04/2004   Archie Endo 07/04/2004  . COLONOSCOPY  early 1990s   /notes 11/11/2008  . CORONARY ANGIOPLASTY WITH STENT PLACEMENT  07/05/2004   notes 07/05/2004  . KNEE ARTHROSCOPY Right   . LITHOTRIPSY    . ROBOT ASSISTED LAPAROSCOPIC NEPHRECTOMY Left 11/06/2013   Procedure: ROBOTIC ASSISTED LAPAROSCOPIC RADICAL  NEPHRECTOMY;  Surgeon: Alexis Frock, MD;  Location: [facility] ORS;  Service: Urology;  Laterality: Left;  . TONSILLECTOMY  1964     Inpatient Medications: Scheduled Meds: . atorvastatin  40 mg Oral q1800  . famotidine  20 mg Oral Daily  . feeding supplement (ENSURE ENLIVE)  237 mL Oral BID BM  . furosemide  60 mg Intravenous BID  . isosorbide dinitrate  20 mg Oral TID  . losartan  50 mg Oral QPM  . metoprolol  100 mg Oral BID  . sertraline  100 mg Oral QPM  . sodium chloride flush  3 mL Intravenous Q12H  . tamsulosin  0.4 mg Oral Daily  . traZODone  50 mg Oral QHS  . Warfarin - Pharmacist Dosing Inpatient   Does not apply q1800   Continuous Infusions:  PRN Meds: sodium chloride, acetaminophen **OR** acetaminophen, ondansetron **OR** ondansetron (ZOFRAN) IV, sodium chloride flush  Allergies:    Allergies  Allergen Reactions    . Ace Inhibitors     REACTION: Cough    Social History:   Social History   Social History  . Marital status: Divorced    Spouse name: N/A  . Number of children: 2  . Years of education: N/A   Occupational History  .  Retired   Social History Main Topics  . Smoking status: Former Smoker    Packs/day: 1.50    Years: 23.00    Types: Cigarettes    Quit date: 08/24/1980  . Smokeless tobacco: Never Used  . Alcohol use 1.5 oz/week    3 Standard drinks or equivalent per week     Comment: 08/14/2016 "maybe 2-3 drinks/month average"  . Drug use: No  . Sexual activity: Not Asked   Other Topics Concern  . None   Social  History Narrative   Retired   Divorced   2 sons 26 &30     Occupation:  Part time sports Tax adviser    Smoking Status:  quit (08/24/1980)   Packs/Day:  1.0    Caffeine use/day:  3 beverages daily    Does Patient Exercise:  no   Alcohol Use - yes    Family History:   Family History  Problem Relation Age of Onset  . Pulmonary embolism Mother     died of PE  . Heart disease Mother     before age 79  . Anuerysm Father     history of popliteal  . Coronary artery disease    . Hyperlipidemia    . Hypertension    . Prostate cancer    . Other Neg Hx     polycystic kidney disease, and colon cancer  . Colon cancer Neg Hx      ROS:  Please see the history of present illness.  All other ROS reviewed and negative.     Physical Exam/Data:   Vitals:   08/16/16 0400 08/16/16 0600 08/16/16 0812 08/16/16 0840  BP: 111/76 130/86 97/66 (!) 142/110  Pulse: (!) 47 68 85 (!) 114  Resp: _0 Temp:   98.6 F (37 C) 97.4 F (36.3 C)  TempSrc:   Oral Axillary  SpO2: 97% 95% 97% 96%  Weight:  216 lb 11.2 oz (98.3 kg)    Height:        Intake/Output Summary (Last 24 hours) at 08/16/16 1027 Last data filed at 08/16/16 1018  Gross per 24 hour  Intake                0 ml  Output             2300 ml  Net            -2300 ml   Filed Weights   08/14/16  1433 08/15/16 0300 08/16/16 0600  Weight: 218 lb 5 oz (99 kg) 216 lb 0.8 oz (98 kg) 216 lb 11.2 oz (98.3 kg)    General:  Well nourished, well developed WM in no acute distress - SOB with minimal activity HEENT: normal Lymph: no adenopathy Neck: no JVD Endocrine:  No thryomegaly Vascular: No carotid bruits; FA pulses 2+ bilaterally without bruits  Cardiac:  normal S1, S2; RRR; no murmur  Lungs: decreased BS throughout but particularly at bases, no wheezing, rhonchi or rales  Abd: soft, nontender, no hepatomegaly  Ext: no edema Musculoskeletal:  No deformities, BUE and BLE strength normal and equal Skin: warm and dry  Neuro:  Mental status as noted above with orientation - knows self; answers simple questions easily but struggles with longer answers to provide a coherent explanation Psych:  Normal affect    EKG:  The EKG was personally reviewed and demonstrates atrial fib 111bpm with LBBB nonspecific ST-T changes  Relevant CV Studies: 2D Echo 08/15/16 Study Conclusions - Left ventricle: The cavity size was mildly dilated. Wall   thickness was increased in a pattern of mild LVH. There was   moderate focal basal hypertrophy of the septum. Systolic function   was severely reduced. The estimated ejection fraction was in the   range of 20% to 25%. Diffuse hypokinesis. The study is not   technically sufficient to allow evaluation of LV diastolic   function. - Aortic valve: There was mild regurgitation. - Aortic root: The aortic root was mildly dilated. - Ascending  aorta: The ascending aorta was mildly dilated. - Mitral valve: There was moderate regurgitation. - Left atrium: The atrium was severely dilated. - Right ventricle: The cavity size was mildly dilated. - Right atrium: The atrium was moderately dilated. Impressions: - Severe LV systolic dysfunction; mild AI; mildly dilated aortic   root (4.6 cm; suggest CTA or MRA to further assess); moderate MR;   biatrial enlargement;  mild RVE; mild TR.   Laboratory Data:  Chemistry Recent Labs Lab 08/14/16 1023 08/15/16 0329 08/16/16 0203  NA 142 141 141  K 3.3* 3.3* 3.3*  CL 110 108 106  CO2 _0 GLUCOSE 96 99 95  BUN 25* 24* 24*  CREATININE 2.01* 2.05* 2.01*  CALCIUM 8.8* 8.6* 8.5*  GFRNONAA 31* 30* 31*  GFRAA 36* 35* 36*  ANIONGAP _1 Recent Labs Lab 08/14/16 1023 08/16/16 0203  PROT 6.4* 5.7*  ALBUMIN 3.6 3.2*  AST 20 18  ALT 14* 11*  ALKPHOS 77 66  BILITOT 1.7* 1.2   Hematology Recent Labs Lab 08/14/16 1023 08/15/16 0329  WBC 4.9 6.6  RBC 3.82* 3.76*  HGB 12.0* 11.5*  HCT 36.3* 35.4*  MCV 95.0 94.1  MCH 31.4 30.6  MCHC 33.1 32.5  RDW 16.7* 16.7*  PLT 153 143*   Cardiac Enzymes Recent Labs Lab 08/14/16 1024 08/14/16 1757 08/14/16 2257  TROPONINI <0.03 <0.03 <0.03   BNP Recent Labs Lab 08/14/16 1047  BNP 864.3*     Radiology/Studies:  Dg Chest 2 View  Result Date: 08/14/2016 CLINICAL DATA:  Cough and chest congestion.  Altered mental status. EXAM: CHEST  2 VIEW COMPARISON:  01/05/2016 FINDINGS: The heart size normal. Pulmonary vascularity is at the upper limits of normal. The patient has new bilateral small pleural effusions. Extensive calcification in the tortuous thoracic aorta. No discrete pulmonary edema. No acute bone abnormality. IMPRESSION: New small bilateral pleural effusions. Pulmonary vascularity is at the upper limits of normal. Electronically Signed   By: Lorriane Shire M.D.   On: 08/14/2016 11:16   Ct Head Wo Contrast  Result Date: 08/14/2016 CLINICAL DATA:  77 year old hypertensive male altered mental status. Initial encounter. EXAM: CT HEAD WITHOUT CONTRAST TECHNIQUE: Contiguous axial images were obtained from the base of the skull through the vertex without intravenous contrast. COMPARISON:  None. FINDINGS: Brain: No intracranial hemorrhage. Prominent chronic microvascular changes and remote bilateral basal ganglia infarcts without CT evidence  of large acute infarct. Mild global atrophy without hydrocephalus. No intracranial mass lesion noted on this unenhanced exam. Vascular: Vascular calcifications. Skull: No acute abnormality. Sinuses/Orbits: No acute orbital abnormality. Visualized paranasal sinuses are clear. Other: Negative IMPRESSION: Prominent chronic microvascular changes and remote bilateral basal ganglia infarcts without CT evidence of large acute infarct. No intracranial hemorrhage. Mild global atrophy. Electronically Signed   By: Genia Del M.D.   On: 08/14/2016 11:27   Mr Brain Wo Contrast  Result Date: 08/15/2016 CLINICAL DATA:  Confusion. EXAM: MRI HEAD WITHOUT CONTRAST TECHNIQUE: Multiplanar, multiecho pulse sequences of the brain and surrounding structures were obtained without intravenous contrast. COMPARISON:  Head CT 08/14/2016 FINDINGS: Brain: There is no evidence of acute infarct, mass, midline shift, or extra-axial fluid collection. A few scattered chronic microhemorrhages are noted in the right parietal and posterior left temporal lobes. Patchy to confluent T2 hyperintensities in the cerebral white matter and brainstem are nonspecific but compatible with moderate to advanced chronic small vessel ischemic disease. Chronic lacunar infarcts are noted in the bilateral basal ganglia  and right centrum semiovale. Heterogeneous T2 hyperintensity is also present in the thalami. There is mild generalized cerebral atrophy. Vascular: Major intracranial vascular flow voids are preserved. Skull and upper cervical spine: Unremarkable bone marrow signal. Sinuses/Orbits: Unremarkable orbits. Small mucous retention cysts in the maxillary sinuses. Small left mastoid effusion. Other: None. IMPRESSION: 1. No acute intracranial abnormality. 2. Extensive chronic small vessel ischemic disease. Electronically Signed   By: Logan Bores M.D.   On: 08/15/2016 13:50   Dg Chest Port 1 View  Result Date: 08/15/2016 CLINICAL DATA:  Dyspnea at rest  EXAM: PORTABLE CHEST 1 VIEW COMPARISON:  08/14/2016 FINDINGS: Stable cardiomegaly with aortic atherosclerosis. Mild interstitial pulmonary edema with layering bilateral effusions right slightly greater than left. No acute osseous abnormality. IMPRESSION: Interstitial pulmonary edema with layering bilateral effusions right greater than left. Stable cardiomegaly with aortic atherosclerosis. Electronically Signed   By: Ashley Royalty M.D.   On: 08/15/2016 02:40    Assessment and Plan:   1. Acute encephalopathy - being evaluated per IM. Per their notes, psych consult was suggested. Would consider further evaluation by neurology as well (may consider obtaining tickborne titers, LP if indicated). Of note patient has lost about 25lb since November as well so I would be concerned there is something systemic occurring.  2. Acute systolic CHF - etiology not clear at this time, does not seem to be related to #1. He was previously on Lasix as OP (?diastolic CHF), He was admitted with bilateral effusions, hypoxia, and new LV dysfunction. Blood pressure has been variable and soft at times. Continue IV Lasix as ordered for today. He's had good UOP so far. Will discontinue amlodipine with an eye towards instead optimizing his HF regimen as BP allows. Consider changing Lopressor to Toprol at discharge. Would not change to carvedilol at this time given his COPD. Will review further w/u with MD.  3. Permanent atrial fib with elevated HR at times (80s-120s) - question reactive to whatever process is leading #1. Can consider titration of BB. Diltiazem less ideal given his LV dysfunction.  4. New LBBB - will review further w/u with MD.  5. CAD - see above. Not on ASA due to concomitant warfarin. Continue beta blocker, statin.  6. CKD stage III-IV with hypokalemia - being followed daily, potassium repleted this AM.  7. Dilated aortic root - will need to be followed, also noted on echo in 2017.  Signed, Charlie Pitter, PA-C    08/16/2016 10:27 AM   Patient examined chart reviewed. Minimal history from patient due to confusion. Exam benign with clear lungs No murmur Telemetry with afib rate 80-100 chronic. New LBBB and decreased EF. He is euvolemic and in no Distress with sats 100%. No chest pain. On appropriate medical Rx Agree with holding norvasc in setting of  Low EF. Will arrange outpatient f/u with Dr Stanford Breed but suspect elective cath in order No evidence of acute Cardiac event Will need to hold coumadin to do LP which would appear to be in order to further w/u mental status Changes and encephalitis. CT and non contrast MRI unrevealing Check ESR to r/o more systemic issue with  Weight loss also check TSH  Jenkins Rouge

## 2016-08-16 NOTE — Progress Notes (Signed)
[facility] for warfarin Indication: atrial fibrillation  Allergies  Allergen Reactions  . Ace Inhibitors     REACTION: Cough    Patient Measurements: Height: 5' 11.5" (181.6 cm) Weight: 216 lb 11.2 oz (98.3 kg) IBW/kg (Calculated) : 76.45  Vital Signs: Temp: 98.6 F (37 C) (04/11 1104) Temp Source: Axillary (04/11 1104) BP: 112/82 (04/11 1000) Pulse Rate: 107 (04/11 1104)  Labs:  Recent Labs  08/14/16 1023 08/14/16 1024  08/14/16 1757 08/14/16 2257 08/15/16 0329 08/16/16 0203  HGB 12.0*  --   --   --   --  11.5*  --   HCT 36.3*  --   --   --   --  35.4*  --   PLT 153  --   --   --   --  143*  --   APTT  --   --   --  52*  --   --   --   LABPROT  --   --   < > 30.1*  --  31.5* 33.0*  INR  --   --   < > 2.80  --  2.96 3.15  CREATININE 2.01*  --   --   --   --  2.05* 2.01*  TROPONINI  --  <0.03  --  <0.03 <0.03  --   --   < > = values in this interval not displayed.  Assessment: 76 yo male admitted 4/9 with confusion. Warfarin prior to admission for atrial fibrillation. INR 3.1 this morning. No bleeding issues noted.  Cards mentioning need for LP and need for lower INR so will hold tonight and follow up plan in the morning if further doses need to be held.  Prior to admission dose 7.5 mg on Wednesday and 5 mg AOD's   Goal of Therapy:  INR 2-3 Monitor platelets by anticoagulation protocol: Yes   Plan:  1. Hold warfarin today 2. Daily INR for now  Erin Hearing PharmD., BCPS Clinical Pharmacist Pager [phone] 08/16/2016 2:10 PM

## 2016-08-16 NOTE — Progress Notes (Signed)
EEG Completed; Results Pending  

## 2016-08-16 NOTE — Progress Notes (Addendum)
PROGRESS NOTE    Mark [name]  NFA:[id number] DOB: March 09, 1941 DOA: 08/14/2016 PCP: Nance Pear., NP   Outpatient Specialists:     Brief Narrative:   Mark [name] is a 76 y.o. male with medical history significant of AAA, atrial fibrillation, coronary artery disease, C KD, GERD, renal cell carcinoma status post nephrectomy, hypertension, presenting with two-week history of confusion.  Patient endorses "weird dreams " with reoccurring vivid dreams but always tired to reality such as curtains in his house.  Patient states that he realizes that these are unreal experiences. he is making bizarre phone calls to family starting as far back as 2 weeks. Day before admission, patient called patient's ex wife and thought he was at his wife's house when all reality he was at his own house. Patient says he has increasing dyspnea on exertion over the last 3 months but denies lower extremity swelling.  Echo was done that showed new EF of 20-25%.  Patient was also observed to have worsening mental status and periods of apnea and de-satting.      Assessment & Plan:   Active Problems:   BPH (benign prostatic hyperplasia)   Atrial fibrillation, chronic (HCC)   AAA (abdominal aortic aneurysm) without rupture (HCC)   Renal cell carcinoma (HCC)   Acute encephalopathy   Acute on chronic respiratory failure with hypoxia (HCC)   Acute encephalopathy: suspect due to hypoxemia -predominantly hallucinations and vivid dreams -due to Hypoxemia vs dementia/delirium - MRI negative for acute CVA/mass but shows extensive chronic small vessel disease -UDS negative- ? Alcohol use- patient and family deny -h/o renal cell cancer -O2-- wean as tolerated -note from [name] on 11/17 patient reported vivid dreams -ammonia normal, TSH normal, B12 normal, RPR negative Suspect vascular Dementia- may need full neuro-cognitive eval as outpatient once medical issues stabilized  OSA -CPAP at  night -will need outpatient sleep study  New acute systolic CHF with diffuse hypokinesis -cardiology consult -IV lasix -daily weights -I/Os  Acute respiratory failure:  -due to acute systolic CHF exacerbation - Lasix 60 IV BID - Echo shows new EF of 20-25% with diffuse hypokineses - Wean O2 as able.   CKD stage III: Cr 2.0. Near baseline. s/p nephrectomy - BMET daily  Mild elevation of bilirubin -resolved  HTN: - continue losartan, norvasc, metop, isosorbide  BPH: - continue flomax  GERD: - continue pepcid  Depression:  - continue zoloft- no change in dose per patient recently -psych consult-- may benefit from seroquel at night?  HLD: - continue lipitor  Afib: - tele - continue bblocker, coumadin -it appears from review of chart that patient was not compliant with getting INR checked as directed outpatient- family thinks due to financial issues  AAA: - f/u outpt. Pt due for his yearly appt and imaging.   Hypokalemia -replete   DVT prophylaxis:  Fully anticoagulated   Code Status: Full Code   Family Communication: Ex-wife at bedside  Disposition Plan:     Consultants:        Subjective: Patient had periods of hypoxemia and confusion last PM with apnea as well  Objective: Vitals:   08/16/16 0000 08/16/16 0200 08/16/16 0400 08/16/16 0600  BP: 96/72 118/73 111/76 130/86  Pulse: 92 92 (!) 47 68  Resp: (!) 21 (!) 24 20 17   Temp: 97.9 F (36.6 C)     TempSrc: Axillary     SpO2: 98% 92% 97% 95%  Weight:    98.3 kg (216 lb 11.2 oz)  Height:        Intake/Output Summary (Last 24 hours) at 08/16/16 0812 Last data filed at 08/16/16 0542  Gross per 24 hour  Intake                0 ml  Output             1650 ml  Net            -1650 ml   Filed Weights   08/14/16 1433 08/15/16 0300 08/16/16 0600  Weight: 99 kg (218 lb 5 oz) 98 kg (216 lb 0.8 oz) 98.3 kg (216 lb 11.2 oz)    Examination:  General exam: sleepy this AM--  wakes up but quickly falls back asleep Respiratory system: diminished, no wheezing- poor effort Cardiovascular system: S1 & S2 heard, RRR. No JVD, murmurs, rubs, gallops or clicks. No pedal edema. Gastrointestinal system: Abdomen is nondistended, soft and nontender. No organomegaly or masses felt. Normal bowel sounds heard. Central nervous system: Alert-- not oriented to place, does not remember me from yesterday  Extremities: Symmetric 5 x 5 power.     Data Reviewed: I have personally reviewed following labs and imaging studies  CBC:  Recent Labs Lab 08/14/16 1023 08/15/16 0329  WBC 4.9 6.6  NEUTROABS 3.8  --   HGB 12.0* 11.5*  HCT 36.3* 35.4*  MCV 95.0 94.1  PLT 153 741*   Basic Metabolic Panel:  Recent Labs Lab 08/14/16 1023 08/14/16 1757 08/15/16 0329 08/16/16 0203  NA 142  --  141 141  K 3.3*  --  3.3* 3.3*  CL 110  --  108 106  CO2 24  --  23 23  GLUCOSE 96  --  99 95  BUN 25*  --  24* 24*  CREATININE 2.01*  --  2.05* 2.01*  CALCIUM 8.8*  --  8.6* 8.5*  MG  --  2.1  --   --   PHOS  --  3.3  --   --    GFR: Estimated Creatinine Clearance: 39.4 mL/min (A) (by C-G formula based on SCr of 2.01 mg/dL (H)). Liver Function Tests:  Recent Labs Lab 08/14/16 1023 08/16/16 0203  AST 20 18  ALT 14* 11*  ALKPHOS 77 66  BILITOT 1.7* 1.2  PROT 6.4* 5.7*  ALBUMIN 3.6 3.2*   No results for input(s): LIPASE, AMYLASE in the last 168 hours.  Recent Labs Lab 08/14/16 1303  AMMONIA 13   Coagulation Profile:  Recent Labs Lab 08/09/16 1655 08/14/16 1047 08/14/16 1757 08/15/16 0329 08/16/16 0203  INR 2.6 2.86 2.80 2.96 3.15   Cardiac Enzymes:  Recent Labs Lab 08/14/16 1024 08/14/16 1757 08/14/16 2257  TROPONINI <0.03 <0.03 <0.03   BNP (last 3 results) No results for input(s): PROBNP in the last 8760 hours. HbA1C: No results for input(s): HGBA1C in the last 72 hours. CBG:  Recent Labs Lab 08/15/16 0234  GLUCAP 92   Lipid Profile: No  results for input(s): CHOL, HDL, LDLCALC, TRIG, CHOLHDL, LDLDIRECT in the last 72 hours. Thyroid Function Tests:  Recent Labs  08/14/16 1757  TSH 2.556   Anemia Panel:  Recent Labs  08/14/16 1757  VITAMINB12 678   Urine analysis:    Component Value Date/Time   COLORURINE YELLOW 08/14/2016 [place] 08/14/2016 1142   LABSPEC 1.018 08/14/2016 1142   PHURINE 5.5 08/14/2016 1142   GLUCOSEU NEGATIVE 08/14/2016 1142   GLUCOSEU NEGATIVE 07/17/2014 0945   HGBUR NEGATIVE 08/14/2016 1142  BILIRUBINUR NEGATIVE 08/14/2016 1142   KETONESUR 15 (A) 08/14/2016 1142   PROTEINUR 100 (A) 08/14/2016 1142   UROBILINOGEN 0.2 07/17/2014 0945   NITRITE NEGATIVE 08/14/2016 1142   LEUKOCYTESUR NEGATIVE 08/14/2016 1142      Recent Results (from the past 240 hour(s))  MRSA PCR Screening     Status: None   Collection Time: 08/15/16  3:21 AM  Result Value Ref Range Status   MRSA by PCR NEGATIVE NEGATIVE Final    Comment:        The GeneXpert MRSA Assay (FDA approved for NASAL specimens only), is one component of a comprehensive MRSA colonization surveillance program. It is not intended to diagnose MRSA infection nor to guide or monitor treatment for MRSA infections.       Anti-infectives    None       Radiology Studies: Dg Chest 2 View  Result Date: 08/14/2016 CLINICAL DATA:  Cough and chest congestion.  Altered mental status. EXAM: CHEST  2 VIEW COMPARISON:  01/05/2016 FINDINGS: The heart size normal. Pulmonary vascularity is at the upper limits of normal. The patient has new bilateral small pleural effusions. Extensive calcification in the tortuous thoracic aorta. No discrete pulmonary edema. No acute bone abnormality. IMPRESSION: New small bilateral pleural effusions. Pulmonary vascularity is at the upper limits of normal. Electronically Signed   By: Lorriane Shire M.D.   On: 08/14/2016 11:16   Ct Head Wo Contrast  Result Date: 08/14/2016 CLINICAL DATA:   76 year old hypertensive male altered mental status. Initial encounter. EXAM: CT HEAD WITHOUT CONTRAST TECHNIQUE: Contiguous axial images were obtained from the base of the skull through the vertex without intravenous contrast. COMPARISON:  None. FINDINGS: Brain: No intracranial hemorrhage. Prominent chronic microvascular changes and remote bilateral basal ganglia infarcts without CT evidence of large acute infarct. Mild global atrophy without hydrocephalus. No intracranial mass lesion noted on this unenhanced exam. Vascular: Vascular calcifications. Skull: No acute abnormality. Sinuses/Orbits: No acute orbital abnormality. Visualized paranasal sinuses are clear. Other: Negative IMPRESSION: Prominent chronic microvascular changes and remote bilateral basal ganglia infarcts without CT evidence of large acute infarct. No intracranial hemorrhage. Mild global atrophy. Electronically Signed   By: Genia Del M.D.   On: 08/14/2016 11:27   Mr Brain Wo Contrast  Result Date: 08/15/2016 CLINICAL DATA:  Confusion. EXAM: MRI HEAD WITHOUT CONTRAST TECHNIQUE: Multiplanar, multiecho pulse sequences of the brain and surrounding structures were obtained without intravenous contrast. COMPARISON:  Head CT 08/14/2016 FINDINGS: Brain: There is no evidence of acute infarct, mass, midline shift, or extra-axial fluid collection. A few scattered chronic microhemorrhages are noted in the right parietal and posterior left temporal lobes. Patchy to confluent T2 hyperintensities in the cerebral white matter and brainstem are nonspecific but compatible with moderate to advanced chronic small vessel ischemic disease. Chronic lacunar infarcts are noted in the bilateral basal ganglia and right centrum semiovale. Heterogeneous T2 hyperintensity is also present in the thalami. There is mild generalized cerebral atrophy. Vascular: Major intracranial vascular flow voids are preserved. Skull and upper cervical spine: Unremarkable bone marrow  signal. Sinuses/Orbits: Unremarkable orbits. Small mucous retention cysts in the maxillary sinuses. Small left mastoid effusion. Other: None. IMPRESSION: 1. No acute intracranial abnormality. 2. Extensive chronic small vessel ischemic disease. Electronically Signed   By: Logan Bores M.D.   On: 08/15/2016 13:50   Dg Chest Port 1 View  Result Date: 08/15/2016 CLINICAL DATA:  Dyspnea at rest EXAM: PORTABLE CHEST 1 VIEW COMPARISON:  08/14/2016 FINDINGS: Stable cardiomegaly with aortic atherosclerosis.  Mild interstitial pulmonary edema with layering bilateral effusions right slightly greater than left. No acute osseous abnormality. IMPRESSION: Interstitial pulmonary edema with layering bilateral effusions right greater than left. Stable cardiomegaly with aortic atherosclerosis. Electronically Signed   By: Ashley Royalty M.D.   On: 08/15/2016 02:40        Scheduled Meds: . amLODipine  10 mg Oral QPM  . atorvastatin  40 mg Oral q1800  . famotidine  20 mg Oral Daily  . feeding supplement (ENSURE ENLIVE)  237 mL Oral BID BM  . furosemide  60 mg Intravenous BID  . isosorbide dinitrate  20 mg Oral TID  . losartan  50 mg Oral QPM  . metoprolol  100 mg Oral BID  . potassium chloride  40 mEq Oral Once  . sertraline  100 mg Oral QPM  . sodium chloride flush  3 mL Intravenous Q12H  . tamsulosin  0.4 mg Oral Daily  . traZODone  50 mg Oral QHS  . Warfarin - Pharmacist Dosing Inpatient   Does not apply q1800   Continuous Infusions:   LOS: 1 day    Time spent: 35 min    [place], DO [facility] Pager [phone]  If 7PM-7AM, please contact night-coverage [email or web address] Password [email or web address] 08/16/2016, 8:12 AM

## 2016-08-16 NOTE — Progress Notes (Signed)
Updated Dr.Vann on pts night,pt was very restless and impulsive beginning of shift,  Trying to get out of bed, given prn ativan.  pt noted to snore once asleep and have moments of apnea, will wake up and f/c, placed on NRB to help with airway but will benefit from bipap/cpap. Ex wife, Kalman Shan, would like to be called and given updated this am

## 2016-08-17 DIAGNOSIS — Z87891 Personal history of nicotine dependence: Secondary | ICD-10-CM

## 2016-08-17 DIAGNOSIS — I2510 Atherosclerotic heart disease of native coronary artery without angina pectoris: Secondary | ICD-10-CM

## 2016-08-17 DIAGNOSIS — F05 Delirium due to known physiological condition: Secondary | ICD-10-CM

## 2016-08-17 DIAGNOSIS — Z888 Allergy status to other drugs, medicaments and biological substances status: Secondary | ICD-10-CM

## 2016-08-17 DIAGNOSIS — I447 Left bundle-branch block, unspecified: Secondary | ICD-10-CM

## 2016-08-17 DIAGNOSIS — G309 Alzheimer's disease, unspecified: Secondary | ICD-10-CM

## 2016-08-17 DIAGNOSIS — R443 Hallucinations, unspecified: Secondary | ICD-10-CM

## 2016-08-17 DIAGNOSIS — F0390 Unspecified dementia without behavioral disturbance: Secondary | ICD-10-CM

## 2016-08-17 DIAGNOSIS — Z8249 Family history of ischemic heart disease and other diseases of the circulatory system: Secondary | ICD-10-CM

## 2016-08-17 DIAGNOSIS — Z8042 Family history of malignant neoplasm of prostate: Secondary | ICD-10-CM

## 2016-08-17 DIAGNOSIS — R460 Very low level of personal hygiene: Secondary | ICD-10-CM

## 2016-08-17 DIAGNOSIS — F0281 Dementia in other diseases classified elsewhere with behavioral disturbance: Secondary | ICD-10-CM

## 2016-08-17 LAB — BASIC METABOLIC PANEL
ANION GAP: 8 (ref 5–15)
BUN: 26 mg/dL — AB (ref 6–20)
CALCIUM: 8.4 mg/dL — AB (ref 8.9–10.3)
CO2: 26 mmol/L (ref 22–32)
Chloride: 107 mmol/L (ref 101–111)
Creatinine, Ser: 1.98 mg/dL — ABNORMAL HIGH (ref 0.61–1.24)
GFR calc Af Amer: 37 mL/min — ABNORMAL LOW (ref >60)
GFR, EST NON AFRICAN AMERICAN: 32 mL/min — AB (ref >60)
GLUCOSE: 111 mg/dL — AB (ref 65–99)
Potassium: 3.4 mmol/L — ABNORMAL LOW (ref 3.5–5.1)
Sodium: 141 mmol/L (ref 135–145)

## 2016-08-17 LAB — MAGNESIUM: Magnesium: 2 mg/dL (ref 1.7–2.4)

## 2016-08-17 LAB — B. BURGDORFI ANTIBODIES

## 2016-08-17 LAB — PROTIME-INR
INR: 3.32
PROTHROMBIN TIME: 34.4 s — AB (ref 11.4–15.2)

## 2016-08-17 MED ORDER — CARVEDILOL 12.5 MG PO TABS
12.5000 mg | ORAL_TABLET | Freq: Two times a day (BID) | ORAL | Status: DC
  Administered 2016-08-17 – 2016-08-18 (×2): 12.5 mg via ORAL
  Filled 2016-08-17 (×2): qty 1

## 2016-08-17 MED ORDER — POTASSIUM CHLORIDE CRYS ER 20 MEQ PO TBCR
40.0000 meq | EXTENDED_RELEASE_TABLET | Freq: Once | ORAL | Status: AC
  Administered 2016-08-17: 40 meq via ORAL

## 2016-08-17 MED ORDER — POTASSIUM CHLORIDE CRYS ER 20 MEQ PO TBCR
20.0000 meq | EXTENDED_RELEASE_TABLET | Freq: Every evening | ORAL | Status: DC
  Administered 2016-08-17 – 2016-08-21 (×5): 20 meq via ORAL
  Filled 2016-08-17 (×5): qty 1

## 2016-08-17 NOTE — Progress Notes (Signed)
Progress Note  Patient Name: ROI JAFARI Date of Encounter: 08/17/2016  Primary Cardiologist: Dr. Stanford Breed  Subjective   Ex-wife in room today. He is more confused this morning than yesterday, talking about going to [place], still does not recognize where he is but says he's told he is in the hospital. Able to answer some questions coherently but other questions gives confabulated answer.  Says he thinks his breathing is better today because he is not being pushed into something he doesn't want to do (?).   Inpatient Medications    Scheduled Meds: . atorvastatin  40 mg Oral q1800  . cefTRIAXone (ROCEPHIN)  IV  2 g Intravenous Q24H  . famotidine  20 mg Oral Daily  . feeding supplement (ENSURE ENLIVE)  237 mL Oral BID BM  . furosemide  60 mg Intravenous BID  . isosorbide dinitrate  20 mg Oral TID  . losartan  50 mg Oral QPM  . metoprolol  100 mg Oral BID  . sertraline  100 mg Oral QPM  . sodium chloride flush  3 mL Intravenous Q12H  . tamsulosin  0.4 mg Oral Daily  . traZODone  50 mg Oral QHS  . Warfarin - Pharmacist Dosing Inpatient   Does not apply q1800   Continuous Infusions:  PRN Meds: sodium chloride, acetaminophen **OR** acetaminophen, ondansetron **OR** ondansetron (ZOFRAN) IV, sodium chloride flush   Vital Signs    Vitals:   08/16/16 1938 08/16/16 2315 08/17/16 0500 08/17/16 0737  BP: 124/83 121/87 119/89 130/87  Pulse: 88 85 97 (!) 113  Resp: (!) 24 20 (!) 28 (!) 26  Temp: 97.8 F (36.6 C) 97.9 F (36.6 C) 98.2 F (36.8 C) 97.8 F (36.6 C)  TempSrc: Oral Oral Oral Oral  SpO2: 92% 92% 96% 94%  Weight:   216 lb (98 kg)   Height:        Intake/Output Summary (Last 24 hours) at 08/17/16 0926 Last data filed at 08/17/16 0739  Gross per 24 hour  Intake              290 ml  Output             2100 ml  Net            -1810 ml   Filed Weights   08/15/16 0300 08/16/16 0600 08/17/16 0500  Weight: 216 lb 0.8 oz (98 kg) 216 lb 11.2 oz (98.3 kg) 216 lb  (98 kg)    Telemetry    Atrial fib rates controlled, run of what appears to be possible NSVT yesterday PM- Personally Reviewed   Physical Exam   GEN: No acute distress.  HEENT: Normocephalic, atraumatic, sclera non-icteric. Neck: No JVD or bruits. Cardiac: Irregularly irregular, no murmurs, rubs, or gallops.  Radials/DP/PT 1+ and equal bilaterally.  Respiratory: Clear to auscultation bilaterally. Breathing is unlabored. GI: Soft, nontender, non-distended, BS +x 4. MS: no deformity. Extremities: No clubbing or cyanosis. No edema. Distal pedal pulses are 2+ and equal bilaterally. Neuro:  A+O to self, see above. Follows commands. Psych: pleasant affect; answers simple questions easily but struggles with longer answers to provide a coherent explanation  Labs    Chemistry Recent Labs Lab 08/14/16 1023 08/15/16 0329 08/16/16 0203 08/17/16 0205  NA 142 141 141 141  K 3.3* 3.3* 3.3* 3.4*  CL 110 108 106 107  CO2 24 23 23 26   GLUCOSE 96 99 95 111*  BUN 25* 24* 24* 26*  CREATININE 2.01* 2.05* 2.01* 1.98*  CALCIUM 8.8* 8.6* 8.5* 8.4*  PROT 6.4*  --  5.7*  --   ALBUMIN 3.6  --  3.2*  --   AST 20  --  18  --   ALT 14*  --  11*  --   ALKPHOS 77  --  66  --   BILITOT 1.7*  --  1.2  --   GFRNONAA 31* 30* 31* 32*  GFRAA 36* 35* 36* 37*  ANIONGAP 8 10 12 8      Hematology Recent Labs Lab 08/14/16 1023 08/15/16 0329  WBC 4.9 6.6  RBC 3.82* 3.76*  HGB 12.0* 11.5*  HCT 36.3* 35.4*  MCV 95.0 94.1  MCH 31.4 30.6  MCHC 33.1 32.5  RDW 16.7* 16.7*  PLT 153 143*    Cardiac Enzymes Recent Labs Lab 08/14/16 1024 08/14/16 1757 08/14/16 2257  TROPONINI <0.03 <0.03 <0.03   No results for input(s): TROPIPOC in the last 168 hours.   BNP Recent Labs Lab 08/14/16 1047  BNP 864.3*     DDimer No results for input(s): DDIMER in the last 168 hours.   Radiology    Mr Brain Wo Contrast  Result Date: 08/15/2016 CLINICAL DATA:  Confusion. EXAM: MRI HEAD WITHOUT CONTRAST  TECHNIQUE: Multiplanar, multiecho pulse sequences of the brain and surrounding structures were obtained without intravenous contrast. COMPARISON:  Head CT 08/14/2016 FINDINGS: Brain: There is no evidence of acute infarct, mass, midline shift, or extra-axial fluid collection. A few scattered chronic microhemorrhages are noted in the right parietal and posterior left temporal lobes. Patchy to confluent T2 hyperintensities in the cerebral white matter and brainstem are nonspecific but compatible with moderate to advanced chronic small vessel ischemic disease. Chronic lacunar infarcts are noted in the bilateral basal ganglia and right centrum semiovale. Heterogeneous T2 hyperintensity is also present in the thalami. There is mild generalized cerebral atrophy. Vascular: Major intracranial vascular flow voids are preserved. Skull and upper cervical spine: Unremarkable bone marrow signal. Sinuses/Orbits: Unremarkable orbits. Small mucous retention cysts in the maxillary sinuses. Small left mastoid effusion. Other: None. IMPRESSION: 1. No acute intracranial abnormality. 2. Extensive chronic small vessel ischemic disease. Electronically Signed   By: Logan Bores M.D.   On: 08/15/2016 13:50    Cardiac Studies   2D Echo 08/15/16 Study Conclusions - Left ventricle: The cavity size was mildly dilated. Wall thickness was increased in a pattern of mild LVH. There was moderate focal basal hypertrophy of the septum. Systolic function was severely reduced. The estimated ejection fraction was in the range of 20% to 25%. Diffuse hypokinesis. The study is not technically sufficient to allow evaluation of LV diastolic function. - Aortic valve: There was mild regurgitation. - Aortic root: The aortic root was mildly dilated. - Ascending aorta: The ascending aorta was mildly dilated. - Mitral valve: There was moderate regurgitation. - Left atrium: The atrium was severely dilated. - Right ventricle: The cavity  size was mildly dilated. - Right atrium: The atrium was moderately dilated. Impressions: - Severe LV systolic dysfunction; mild AI; mildly dilated aortic root (4.6 cm; suggest CTA or MRA to further assess); moderate MR; biatrial enlargement; mild RVE; mild TR.   Patient Profile     76 y.o. male with CAD (s/p PCI to LAD/diagonal, subtotal of PDA 2006), permanent atrial fibrillation (pt preferred Coumadin), AAA repair 2011 (with aneurysm sac 8x7, no endoleak, followed by [facility]), HTN, HLD, GERD, OSA, depression, renal cell carcinoma s/p prior nephrectomy, CKD stage III-IV (baseline appears 1.8-2), COPD, remote strokes seen on  CT who was admitted for subacute encephalopathy for several weeks. We are asked to see for decreased EF and atrial fib. Last nuc 2014 was normal, prior EF 55% but now 20-25%, also with new LBBB.   Assessment & Plan    1. Acute encephalopathy - does not appear to be related at all to cardiac status. Being evaluated by IM, ID. Of note patient has lost about 25lb since November as well. Per ex wife, poor living situation.  2. Acute systolic CHF - etiology not clear at this time, does not seem to be related to #1. He was previously on Lasix as OP (?diastolic CHF), He was admitted with bilateral effusions, hypoxia, and new LV dysfunction. Amlodipine discontinued due to LV dysfunction. Consider changing Lopressor to Toprol at discharge. Would not change to carvedilol at this time given his COPD. Consider changing Lasix to oral form this PM as his aeration sounds to have improved.  3. Permanent atrial fib with elevated HR at times (80s-120s) - HR currently 80s. Can consider titration of BB if needed. Diltiazem less ideal given his LV dysfunction.  4. 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 Continue beta blocker, statin. Will need to consider cath once his mental status changes are sorted out.  6. CKD stage III-IV with hypokalemia - being  followed daily. See below re: K.  7. Dilated aortic root - will need to be followed, also noted on echo in 2017.  8. NSVT - continue BB. Give KCl 41meq x 1 now then 11meq this evening. (will make 48meq standing dose). Follow K if any diuretic changes are made. Check Mg.  Signed, Charlie Pitter, PA-C  08/17/2016, 9:26 AM    Patient examined chart reviewed Encephalopathy remains Afib and CHF compensated. Replete K  Coumadin would have to be held for LP further neuro w/u per primary service change to oral lasix In am   Jenkins Rouge

## 2016-08-17 NOTE — Progress Notes (Signed)
Patient received on [facility] as transfer from [facility].  Patient pleasant, cooperative, denies any complaints, VSS.  Sitter at bedside.

## 2016-08-17 NOTE — Progress Notes (Signed)
Patient resting quietly in bed with sitter at bedside 

## 2016-08-17 NOTE — Progress Notes (Signed)
[facility] for warfarin Indication: atrial fibrillation  Allergies  Allergen Reactions  . Ace Inhibitors     REACTION: Cough    Patient Measurements: Height: 5' 11.5" (181.6 cm) Weight: 216 lb (98 kg) IBW/kg (Calculated) : 76.45  Vital Signs: Temp: 97.8 F (36.6 C) (04/12 1135) Temp Source: Oral (04/12 1135) BP: 124/91 (04/12 1135) Pulse Rate: 88 (04/12 1135)  Labs:  Recent Labs  08/14/16 1757 08/14/16 2257 08/15/16 0329 08/16/16 0203 08/17/16 0205  HGB  --   --  11.5*  --   --   HCT  --   --  35.4*  --   --   PLT  --   --  143*  --   --   APTT 52*  --   --   --   --   LABPROT 30.1*  --  31.5* 33.0* 34.4*  INR 2.80  --  2.96 3.15 3.32  CREATININE  --   --  2.05* 2.01* 1.98*  TROPONINI <0.03 <0.03  --   --   --     Assessment: 76 yo male admitted 4/9 with confusion. Warfarin prior to admission for atrial fibrillation. INR 3.2 - trending up despite warfarin held last pm. No bleeding issues noted.  Cards mentioning need for LP and need for lower INR  Prior to admission dose 7.5 mg on Wednesday and 5 mg AOD's   Goal of Therapy:  INR 2-3 Monitor platelets by anticoagulation protocol: Yes   Plan:  1. Hold warfarin today 2. Daily INR   Bonnita Nasuti Pharm.D. CPP, BCPS Clinical Pharmacist [phone] 08/17/2016 1:52 PM

## 2016-08-17 NOTE — Progress Notes (Signed)
[facility] Progress Note  Patient: Mark [name] WSF:[id number]   PCP: Nance Pear., NP DOB: [birth year]   DOA: 08/14/2016   DOS: 08/17/2016   Date of Service: the patient was seen and examined on 08/17/2016  Subjective: No acute distress no acute events overnight.  Brief hospital course: Pt. with PMH of AAA, CAD, GERD, renal cell carcinoma S/P nephrectomy, HTN, A. fib; admitted on 08/14/2016, with complaint of confusion, was found to have acute encephalopathy as well as acute systolic CHF with acute respiratory failure. Currently further plan is current management.  Assessment and Plan: Acute encephalopathy:  -predominantly hallucinations and vivid dreams -Hypoxemia vs intracranial process not yet visualized- MRI negative -CT head w/o acute process -UDS negative- ? Alcohol use -h/o renal cell cancer w/ vague possibility of mets per EDP report. No significant metabolic derangements. Question psych etiology. No sedating medications.  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  Acute respiratory failure: LBBB Acute on chronic systolic CHF, moderate MR. Previous echo w/ nml EF and evidence of diastolic chf. CXR w/o evidence of infectious process. BNP 84. Troponin.01. Lasix 60 IV x1 in ED at [facility]. ABG pH 7.4, PCO2 29, P O2 64, CO2 21.  - Cardiology consulted, currently on Lasix 40 IV BID Recommended further workup for tickborne illnesses although Borrelia titers negative. ID recommended discontinuing antibiotics. Will change metoprolol to Coreg.  CKD: Cr 2.0. Near baseline. s/p nephrectomy - BMET daily  HTN: - Hold losartan, continue norvasc, Coreg, isosorbide  BPH: - continue flomax  GERD: -  contineu pepcid  Depression:  - continue zoloft- no change in dose per patient  HLD: - continue lipitor  Afib: - tele - continue bblocker, coumadin -it appears from review of chart that patient was not compliant with getting INR checked as directed  AAA: - f/u outpt. Pt due for his yearly appt and imaging.   Bowel regimen: last BM 08/12/2016 Diet: cardiac diet DVT Prophylaxis: warfarin  Advance goals of care discussion: full code  Family Communication: no family was present at bedside, at the time of interview.   Disposition:  Discharge to home. Expected discharge date: 08/18/2016, stabilization CHF  Consultants: cardiology, infectious disease Procedures: Echocardiogram   Antibiotics: Anti-infectives    Start     Dose/Rate Route Frequency Ordered Stop   08/16/16 1600  cefTRIAXone (ROCEPHIN) 2 g in dextrose 5 % 50 mL IVPB  Status:  Discontinued     2 g 100 mL/hr over 30 Minutes Intravenous Every 24 hours 08/16/16 1552 08/17/16 0944       Objective: Physical Exam: Vitals:   08/17/16 0500 08/17/16 0737 08/17/16 1135 08/17/16 1556  BP: 119/89 130/87 (!) 124/91 116/79  Pulse: 97 (!) 113 88 82  Resp: (!) 28 (!) 26 (!) 23   Temp: 98.2 F (36.8 C) 97.8 F (36.6 C) 97.8 F (36.6 C)   TempSrc: Oral Oral Oral   SpO2: 96% 94% (!) 88% 95%  Weight: 98 kg (216 lb)     Height:        Intake/Output Summary (Last 24 hours) at 08/17/16 1645 Last data filed at 08/17/16 0900  Gross per 24 hour  Intake              530 ml  Output             1750 ml  Net            -1220 ml   Filed Weights   08/15/16 0300 08/16/16 0600 08/17/16 0500  Weight: 98 kg (216 lb 0.8 oz) 98.3 kg (216 lb 11.2 oz) 98 kg (216 lb)   General: Alert, Awake and Oriented to Time, Place and Person. Appear in mild distress, affect appropriate Eyes: PERRL, Conjunctiva normal ENT: Oral Mucosa clear moist. Neck: no JVD, no Abnormal Mass Or lumps Cardiovascular: S1 and S2 Present, no  Murmur, Respiratory: Bilateral Air entry equal and Decreased, no use of accessory muscle, Clear to Auscultation, no Crackles, no wheezes Abdomen: Bowel Sound present, Soft and no tenderness Skin: no redness, no Rash, no induration Extremities: no Pedal edema, no calf tenderness Neurologic: Grossly no focal neuro deficit. Bilaterally Equal motor strength  Data Reviewed: CBC:  Recent Labs Lab 08/14/16 1023 08/15/16 0329  WBC 4.9 6.6  NEUTROABS 3.8  --   HGB 12.0* 11.5*  HCT 36.3* 35.4*  MCV 95.0 94.1  PLT 153 161*   Basic Metabolic Panel:  Recent Labs Lab 08/14/16 1023 08/14/16 1757 08/15/16 0329 08/16/16 0203 08/17/16 0205 08/17/16 1000  NA 142  --  141 141 141  --   K 3.3*  --  3.3* 3.3* 3.4*  --   CL 110  --  108 106 107  --   CO2 24  --  23 23 26   --   GLUCOSE 96  --  99 95 111*  --   BUN 25*  --  24* 24* 26*  --   CREATININE 2.01*  --  2.05* 2.01* 1.98*  --   CALCIUM 8.8*  --  8.6* 8.5* 8.4*  --   MG  --  2.1  --   --   --  2.0  PHOS  --  3.3  --   --   --   --     Liver Function Tests:  Recent Labs Lab 08/14/16 1023 08/16/16 0203  AST 20 18  ALT 14* 11*  ALKPHOS 77 66  BILITOT 1.7* 1.2  PROT 6.4* 5.7*  ALBUMIN 3.6 3.2*   No results for input(s): LIPASE, AMYLASE in the last 168 hours.  Recent Labs Lab 08/14/16 1303  AMMONIA 13   Coagulation Profile:  Recent Labs Lab 08/14/16 1047 08/14/16 1757 08/15/16 0329 08/16/16 0203 08/17/16 0205  INR 2.86 2.80 2.96 3.15 3.32   Cardiac Enzymes:  Recent Labs Lab 08/14/16 1024 08/14/16 1757 08/14/16 2257  TROPONINI <0.03 <0.03 <0.03   BNP (last 3 results) No results for input(s): PROBNP in the last 8760 hours. CBG:  Recent Labs Lab 08/15/16 0234  GLUCAP 92   Studies: No results found.  Scheduled Meds: . atorvastatin  40 mg Oral q1800  . famotidine  20 mg Oral Daily  . feeding supplement (ENSURE ENLIVE)  237 mL Oral BID BM  . furosemide  60 mg Intravenous BID  . isosorbide  dinitrate  20 mg Oral TID  . losartan  50 mg Oral QPM  . metoprolol  100 mg Oral BID  . potassium chloride  20 mEq Oral QPM  . sertraline  100 mg Oral QPM  . sodium chloride flush  3 mL Intravenous Q12H  . tamsulosin  0.4 mg Oral Daily  . traZODone  50 mg Oral QHS  . Warfarin - Pharmacist Dosing Inpatient   Does not apply [id number]  Continuous Infusions: PRN Meds: sodium chloride, acetaminophen **OR** acetaminophen, ondansetron **OR** ondansetron (ZOFRAN) IV, sodium chloride flush  Time spent: 35 minutes  Author: Berle Mull, MD [facility] Pager: [phone] 08/17/2016 4:45 PM  If 7PM-7AM, please contact night-coverage at [email or web address], password [facility]

## 2016-08-17 NOTE — Care Management Note (Signed)
Case Management Note  Patient Details  Name: Mark [name] MRN: [id number] Date of Birth: October 11, 1940  Subjective/Objective:    Pt admitted with AMS - hallucinations                Action/Plan:   Pt off unit for procedure and therefore not assessed.  Per the chart - pt needs sleep study - CM placed order form on shadow chart and requested completion via sticky note.  Pt will likely have pysch consult.  CM will continue to follow for discharge needs   Expected Discharge Date:                  Expected Discharge Plan:     In-House Referral:     Discharge planning Services  CM Consult  Post Acute Care Choice:    Choice offered to:     DME Arranged:    DME Agency:     HH Arranged:    HH Agency:     Status of Service:  In process, will continue to follow  If discussed at Long Length of Stay Meetings, dates discussed:    Additional Comments: 08/17/2016 Pt remains confused.  EEG showed diffuse slowing - starting workup for lyme disease )IV antibiotics. Maryclare Labrador, RN 08/17/2016, 9:30 AM

## 2016-08-17 NOTE — Consult Note (Signed)
Date of Admission:  08/14/2016  Date of Consult:  08/17/2016  Reason for Consult: concern for possible Borrelia Burgdorferi infection causing LBBB and encephalopathy Referring Physician: Dr. Posey Pronto   HPI: Mark [name] is an 76 y.o. male with AAA, atrial fibrillation, CAD who had what sounds like onset of dementia 4 years ago when he developed behavioral problems causing his wife to leave him. One day prior to admission. Patient's son, who lives with him,  called patient's ex wife due to concerns of her confusion where patient thought he was at his wife's house when all reality he was at his own house. Patient's ex wife endorses taking 2 some of his family members who state that he is having bizarre phone calls with them starting as far back as 2 weeks. In talking to the patients ex-wife she showed me video of his house which was full of hoarded objects, garbage strewn across the floors, multiple bottles of liquor and beer.   He was admitted with confusion and also found to have LBBB. He had zero fevers. He has had rash but nothing consistent with EM. He does not have dogs and while he does go outside lives in apt complex and not engaged in frequent outdoor activities. There was concern for possible Lyme carditis and encephalopathy though his MRI brain showed zero evidence of encephalitis. Since then Lyme EIA was sent and was unsurprisingly negative (I had already stopped his CTX)  Past Medical History:  Diagnosis Date  . AAA (abdominal aortic aneurysm) (HCC)    9.1 cm repair 05/2009 ( ENDOVASCULAR REPAIR ).  MORE RECENT RE-EVALUATION OF ENLARGING AAA BY DR. Trula Slade AND BY DR. Kathlene Cote  MAY 2015 - PT STATES HE UNDERSTANDS THAT NOTHING IS LEAKING AT PRESENT TIME AND HE IS TO FOLLOW UP WITH HIS DOCTORS FOR FUTURE MONITORING I  . Arthritis    LEFT HAND, RT KNEE (08/14/2016)  . Basal cell carcinoma    "right side of my nose; think they burned it off" (08/14/2016)  . CAD (coronary artery disease)     a. s/p PCI to LAD/diagonal, subtotal of PDA 2006. b. Negative nuc 2014.  Marland Kitchen CHF (congestive heart failure) ([place]) 08/14/2016  . Chronic atrial fibrillation ([place])   . CKD (chronic kidney disease), stage III    a. stage III-IV, Cr baseline appears 1.8-2.0  . COPD (chronic obstructive pulmonary disease) ([place])   . Depression   . GERD (gastroesophageal reflux disease)   . History of kidney stones   . History of pneumothorax    LONG TIME AGO  . Hyperlipidemia   . Hypertension   . Migraine    "very rare the last 20 years" (08/14/2016)  . Myocardial infarction 2006  . Osteoarthritis   . Renal cell carcinoma ([place])   . Shingles outbreak    PT NOTICED SKIN RASH RT GROIN AND RIGHT TRUNK ON MONDAY 10/27/13 - NOT A LOT OF DISCOMFORT- NOT ON ANY ORAL MEDS TO TREAT.  . Sleep apnea    "dx'd; didn't try mask" (08/14/2016)  . Stroke [facility])    "CT showed I've had old strokes; never knew about it before today" (08/14/2016)    Past Surgical History:  Procedure Laterality Date  . ABDOMINAL AORTAGRAM N/A 09/24/2013   Procedure: ABDOMINAL Maxcine Ham;  Surgeon: Serafina Mitchell, MD;  Location: [facility] CATH LAB;  Service: Cardiovascular;  Laterality: N/A;  . ABDOMINAL AORTIC ANEURYSM REPAIR  JAN 2011   ENDOVASCULAR - DR. BRABHAM  .  CARDIAC CATHETERIZATION  07/04/2004   Archie Endo 07/04/2004  . COLONOSCOPY  early 1990s   /notes 11/11/2008  . CORONARY ANGIOPLASTY WITH STENT PLACEMENT  07/05/2004   notes 07/05/2004  . KNEE ARTHROSCOPY Right   . LITHOTRIPSY    . ROBOT ASSISTED LAPAROSCOPIC NEPHRECTOMY Left 11/06/2013   Procedure: ROBOTIC ASSISTED LAPAROSCOPIC RADICAL  NEPHRECTOMY;  Surgeon: Alexis Frock, MD;  Location: [facility] ORS;  Service: Urology;  Laterality: Left;  . TONSILLECTOMY  1964    Social History:  reports that he quit smoking about 36 years ago. His smoking use included Cigarettes. He has a 34.50 pack-year smoking history. He has never used smokeless tobacco. He reports that he drinks about 1.5 oz of alcohol per  week . He reports that he does not use drugs.   Family History  Problem Relation Age of Onset  . Pulmonary embolism Mother     died of PE  . Heart disease Mother     before age 71  . Anuerysm Father     history of popliteal  . Coronary artery disease    . Hyperlipidemia    . Hypertension    . Prostate cancer    . Other Neg Hx     polycystic kidney disease, and colon cancer  . Colon cancer Neg Hx     Allergies  Allergen Reactions  . Ace Inhibitors     REACTION: Cough     Medications: I have reviewed patients current medications as documented in Epic Anti-infectives    Start     Dose/Rate Route Frequency Ordered Stop   08/16/16 1600  cefTRIAXone (ROCEPHIN) 2 g in dextrose 5 % 50 mL IVPB  Status:  Discontinued     2 g 100 mL/hr over 30 Minutes Intravenous Every 24 hours 08/16/16 1552 08/17/16 0944         ROS:  as in HPI otherwise remainder of 12 point Review of Systems is not obtainable due to patient's confusion   Blood pressure 116/79, pulse 82, temperature 97.8 F (36.6 C), temperature source Oral, resp. rate (!) 23, height 5' 11.5" (1.816 m), weight 216 lb (98 kg), SpO2 95 %. General: Alert and awake, oriented person, conversant and follows commands HEENT: anicteric sclera,  EOMI, oropharynx clear and without exudate Cardiovascular: regular rate, normal r,  no murmur rubs or gallops Pulmonary: clear to auscultation bilaterally, no wheezing, rales or rhonchi Gastrointestinal: soft nontender, nondistended, normal bowel sounds, Musculoskeletal: no  clubbing or edema noted bilaterally Skin, soft tissue:  Rashes, none of which are c/w Lyme:  08/17/16: chest     legs        Neuro: nonfocal, strength and sensation intact   Results for orders placed or performed during the hospital encounter of 08/14/16 (from the past 48 hour(s))  Protime-INR     Status: Abnormal   Collection Time: 08/16/16  2:03 AM  Result Value Ref Range   Prothrombin Time 33.0 (H)  11.4 - 15.2 seconds   INR 3.15   Comprehensive metabolic panel     Status: Abnormal   Collection Time: 08/16/16  2:03 AM  Result Value Ref Range   Sodium 141 135 - 145 mmol/L   Potassium 3.3 (L) 3.5 - 5.1 mmol/L   Chloride 106 101 - 111 mmol/L   CO2 23 22 - 32 mmol/L   Glucose, Bld 95 65 - 99 mg/dL   BUN 24 (H) 6 - 20 mg/dL   Creatinine, Ser 2.01 (H) 0.61 - 1.24 mg/dL   Calcium  8.5 (L) 8.9 - 10.3 mg/dL   Total Protein 5.7 (L) 6.5 - 8.1 g/dL   Albumin 3.2 (L) 3.5 - 5.0 g/dL   AST 18 15 - 41 U/L   ALT 11 (L) 17 - 63 U/L   Alkaline Phosphatase 66 38 - 126 U/L   Total Bilirubin 1.2 0.3 - 1.2 mg/dL   GFR calc non Af Amer 31 (L) >60 mL/min   GFR calc Af Amer 36 (L) >60 mL/min    Comment: (NOTE) The eGFR has been calculated using the CKD EPI equation. This calculation has not been validated in all clinical situations. eGFR's persistently <60 mL/min signify possible Chronic Kidney Disease.    Anion gap 12 5 - 15  Sedimentation rate     Status: Abnormal   Collection Time: 08/16/16 11:02 AM  Result Value Ref Range   Sed Rate 18 (H) 0 - 16 mm/hr  B. burgdorfi antibodies     Status: None   Collection Time: 08/16/16  4:09 PM  Result Value Ref Range   B burgdorferi Ab IgG+IgM <0.91 0.00 - 0.90 ISR    Comment: (NOTE)                                Negative         <0.91                                Equivocal  0.91 - 1.09                                Positive         >1.09 Performed At: [facility] [address] [place], [place] [id number] Lindon Romp MD BZ:[phone]   Protime-INR     Status: Abnormal   Collection Time: 08/17/16  2:05 AM  Result Value Ref Range   Prothrombin Time 34.4 (H) 11.4 - 15.2 seconds   INR 0.17   Basic metabolic panel     Status: Abnormal   Collection Time: 08/17/16  2:05 AM  Result Value Ref Range   Sodium 141 135 - 145 mmol/L   Potassium 3.4 (L) 3.5 - 5.1 mmol/L   Chloride 107 101 - 111 mmol/L   CO2 26 22 - 32 mmol/L   Glucose,  Bld 111 (H) 65 - 99 mg/dL   BUN 26 (H) 6 - 20 mg/dL   Creatinine, Ser 1.98 (H) 0.61 - 1.24 mg/dL   Calcium 8.4 (L) 8.9 - 10.3 mg/dL   GFR calc non Af Amer 32 (L) >60 mL/min   GFR calc Af Amer 37 (L) >60 mL/min    Comment: (NOTE) The eGFR has been calculated using the CKD EPI equation. This calculation has not been validated in all clinical situations. eGFR's persistently <60 mL/min signify possible Chronic Kidney Disease.    Anion gap 8 5 - 15  Magnesium     Status: None   Collection Time: 08/17/16 10:00 AM  Result Value Ref Range   Magnesium 2.0 1.7 - 2.4 mg/dL   _0 (sdes,specrequest,cult,reptstatus)   ) Recent Results (from the past 720 hour(s))  MRSA PCR Screening     Status: None   Collection Time: 08/15/16  3:21 AM  Result Value Ref Range Status   MRSA by PCR NEGATIVE NEGATIVE Final  Comment:        The GeneXpert MRSA Assay (FDA approved for NASAL specimens only), is one component of a comprehensive MRSA colonization surveillance program. It is not intended to diagnose MRSA infection nor to guide or monitor treatment for MRSA infections.      Impression/Recommendation  Active Problems:   BPH (benign prostatic hyperplasia)   Atrial fibrillation, chronic (HCC)   AAA (abdominal aortic aneurysm) without rupture (HCC)   Renal cell carcinoma (HCC)   OSA (obstructive sleep apnea)   Acute encephalopathy   Acute on chronic respiratory failure with hypoxia (HCC)   Acute systolic CHF (congestive heart failure) (HCC)   Mark [name] is a 76 y.o. male with what clearly seems to be dementia with behavioral disturbance already 4 years ago now with hallucinations, hoarding, extremely poor hygeine, confusion, LBBB  #1 Confusion: this is undoubtedly due to his dementia which has progressed. His RPR and HIV were negative. Would consider Neurology consult and also get CM involved re placement of the patient into SNF with expertise with Alzheimers type dementia.  (certainly it could be another type of dementia  #2 LBBB: due to something more typical than Lyme  #3 Concern for Lyme: clinically this did not fit. [place] not hyperendemic area for Lyme. He had zero known fevers. No hx or EM. He had no evidence of encephalitis by imaging or clinically given no fever. Lyme EIA also negative   I will sign off   Please call with further questions.    08/17/2016, 7:27 PM   Thank you so much for this interesting consult  [place] for [place] [phone] (pager) [phone] (office) 08/17/2016, 7:27 PM  Rhina [place] 08/17/2016, 7:27 PM

## 2016-08-18 LAB — GLUCOSE, CAPILLARY
GLUCOSE-CAPILLARY: 97 mg/dL (ref 65–99)
GLUCOSE-CAPILLARY: 101 mg/dL — AB (ref 65–99)

## 2016-08-18 LAB — CBC WITH DIFFERENTIAL/PLATELET
BASOS ABS: 0 10*3/uL (ref 0.0–0.1)
Basophils Relative: 0 %
Eosinophils Absolute: 0.2 10*3/uL (ref 0.0–0.7)
Eosinophils Relative: 5 %
HCT: 34.9 % — ABNORMAL LOW (ref 39.0–52.0)
Hemoglobin: 11.1 g/dL — ABNORMAL LOW (ref 13.0–17.0)
LYMPHS ABS: 0.9 10*3/uL (ref 0.7–4.0)
LYMPHS PCT: 17 %
MCH: 30.2 pg (ref 26.0–34.0)
MCHC: 31.8 g/dL (ref 30.0–36.0)
MCV: 95.1 fL (ref 78.0–100.0)
MONO ABS: 0.5 10*3/uL (ref 0.1–1.0)
Monocytes Relative: 9 %
NEUTROS ABS: 3.7 10*3/uL (ref 1.7–7.7)
Neutrophils Relative %: 69 %
Platelets: 143 10*3/uL — ABNORMAL LOW (ref 150–400)
RBC: 3.67 MIL/uL — ABNORMAL LOW (ref 4.22–5.81)
RDW: 16.4 % — AB (ref 11.5–15.5)
WBC: 5.4 10*3/uL (ref 4.0–10.5)

## 2016-08-18 LAB — PROTIME-INR
INR: 2.26
Prothrombin Time: 25.4 seconds — ABNORMAL HIGH (ref 11.4–15.2)

## 2016-08-18 LAB — BASIC METABOLIC PANEL
ANION GAP: 9 (ref 5–15)
BUN: 27 mg/dL — ABNORMAL HIGH (ref 6–20)
CHLORIDE: 106 mmol/L (ref 101–111)
CO2: 26 mmol/L (ref 22–32)
Calcium: 8.5 mg/dL — ABNORMAL LOW (ref 8.9–10.3)
Creatinine, Ser: 1.83 mg/dL — ABNORMAL HIGH (ref 0.61–1.24)
GFR calc Af Amer: 41 mL/min — ABNORMAL LOW (ref >60)
GFR, EST NON AFRICAN AMERICAN: 35 mL/min — AB (ref >60)
GLUCOSE: 107 mg/dL — AB (ref 65–99)
Potassium: 3.2 mmol/L — ABNORMAL LOW (ref 3.5–5.1)
Sodium: 141 mmol/L (ref 135–145)

## 2016-08-18 MED ORDER — FUROSEMIDE 20 MG PO TABS
60.0000 mg | ORAL_TABLET | Freq: Two times a day (BID) | ORAL | Status: DC
  Administered 2016-08-18 – 2016-08-22 (×8): 60 mg via ORAL
  Filled 2016-08-18 (×8): qty 1

## 2016-08-18 MED ORDER — WARFARIN SODIUM 5 MG PO TABS
5.0000 mg | ORAL_TABLET | Freq: Once | ORAL | Status: AC
  Administered 2016-08-18: 5 mg via ORAL
  Filled 2016-08-18: qty 1

## 2016-08-18 MED ORDER — CARVEDILOL 25 MG PO TABS
25.0000 mg | ORAL_TABLET | Freq: Two times a day (BID) | ORAL | Status: DC
  Administered 2016-08-18 – 2016-08-22 (×8): 25 mg via ORAL
  Filled 2016-08-18 (×8): qty 1

## 2016-08-18 NOTE — Evaluation (Signed)
Physical Therapy Evaluation Patient Details Name: Mark [name] MRN: [id number] DOB: 30-Sep-1940 Today's Date: 08/18/2016   History of Present Illness  Patient presents with a history of halluciantsions. PMH includes stroke, shingles, OA, Migranes, HTN, COPD, CKD, CAD, AAA   Clinical Impression  Patient ambulated 300' with no fatigue and no loss of balance. He has no need for skilled therapy at this time. D/C from therapy,     Follow Up Recommendations No PT follow up    Equipment Recommendations       Recommendations for Other Services       Precautions / Restrictions Precautions Precautions: Fall Restrictions Weight Bearing Restrictions: No      Mobility  Bed Mobility Overal bed mobility: Independent                Transfers Overall transfer level: Independent Equipment used: None                Ambulation/Gait Ambulation/Gait assistance: Independent Ambulation Distance (Feet): 300 Feet         General Gait Details: No loss of balance or fatigue noted with gait   Stairs            Wheelchair Mobility    Modified Rankin (Stroke Patients Only)       Balance Overall balance assessment: Independent                                           Pertinent Vitals/Pain Pain Assessment: No/denies pain    Home Living Family/patient expects to be discharged to:: Private residence Living Arrangements: Alone                    Prior Function Level of Independence: Independent               Hand Dominance   Dominant Hand: Right    Extremity/Trunk Assessment   Upper Extremity Assessment Upper Extremity Assessment: Overall WFL for tasks assessed    Lower Extremity Assessment Lower Extremity Assessment: Overall WFL for tasks assessed       Communication   Communication: No difficulties  Cognition Arousal/Alertness: Awake/alert Behavior During Therapy: WFL for tasks assessed/performed Overall  Cognitive Status: Within Functional Limits for tasks assessed                                 General Comments: Patient was appropiraite with all communication       General Comments      Exercises     Assessment/Plan    PT Assessment Patent does not need any further PT services  PT Problem List         PT Treatment Interventions      PT Goals (Current goals can be found in the Care Plan section)  Acute Rehab PT Goals Patient Stated Goal: to figure out why he was hallucinating  PT Goal Formulation: With patient Potential to Achieve Goals:  (Not a PT goal)    Frequency     Barriers to discharge        Co-evaluation               End of Session Equipment Utilized During Treatment: Gait belt Activity Tolerance: Patient tolerated treatment well Patient left: in bed Nurse Communication: Mobility status      Time: 5462-7035  PT Time Calculation (min) (ACUTE ONLY): 10 min   Charges:   PT Evaluation $PT Eval Low Complexity: 1 Procedure     PT G Codes:         Carney Living PT DPT  08/18/2016, 3:59 PM

## 2016-08-18 NOTE — Consult Note (Signed)
   [facility] CM Inpatient Consult   08/18/2016  [name] [birth year] [id number]  Patient evaluated for [place] [facility] as listed in the [place].  Chart reviewed and noted per MD notes the patient is Mark [name] a 76 y.o.malewith medical history significant of AAA, atrial fibrillation, coronary artery disease, C KD, GERD, renal cell carcinoma status post nephrectomy, hypertension, presenting with two-week history of confusion. Level V caveat applies his history is limited due to patient's mental status and is obtained only in part by family members. Patient endorses "weird dreams " with reoccurring vivid dreams but always tired to reality such as curtains in his house or an every dreams about her where he is. Patient states that he realizes that these are unreal experiences. he is making bizarre phone calls to family starting as far back as 2 weeks. One day ago patient called patient's ex wife and thought he was at his wife's house when all reality he was at his own house. Patient denies any dysuria, frequency, chest pain, palpitations, nausea, vomiting, coughing, fevers, neck stiffness, headache, focal neurological deficit. Patient does endorse increasing dyspnea on exertion over the last 3 months but denies lower extremity swelling. Patient is currently being seen by MD. No disposition noted at this time.  Patient continues to talk in a confused manner with random thoughts. No disposition noted at this time.  PT/OT eval request is noted.  Natividad Brood, RN BSN [facility] Liaison  [phone] business mobile phone Toll free office [phone]

## 2016-08-18 NOTE — Progress Notes (Signed)
[place] for warfarin Indication: atrial fibrillation  Allergies  Allergen Reactions  . Ace Inhibitors     REACTION: Cough    Patient Measurements: Height: 5' 11.5" (181.6 cm) Weight: 216 lb 3.2 oz (98.1 kg) (scale c) IBW/kg (Calculated) : 76.45  Vital Signs: Temp: 98.2 F (36.8 C) (04/13 0406) Temp Source: Oral (04/13 0406) BP: 126/83 (04/13 0406) Pulse Rate: 88 (04/13 0406)  Labs:  Recent Labs  08/16/16 0203 08/17/16 0205 08/18/16 0354  HGB  --   --  11.1*  HCT  --   --  34.9*  PLT  --   --  143*  LABPROT 33.0* 34.4* 25.4*  INR 3.15 3.32 2.26  CREATININE 2.01* 1.98* 1.83*    Assessment: 76 yo male admitted 4/9 with confusion. Warfarin prior to admission for atrial fibrillation INR today = 2.26 Prior to admission dose 7.5 mg on Wednesday and 5 mg AOD's   Goal of Therapy:  INR 2-3 Monitor platelets by anticoagulation protocol: Yes   Plan:  Warfarin 5 mg po x 1 dose tonight Daily INR  Thank you Anette Guarneri, PharmD [phone] 08/18/2016 9:53 AM

## 2016-08-18 NOTE — Progress Notes (Signed)
Pt is alert and oriented with loose/random conversations and some joking a lot. Pleasant, calm and cooperative. psych referral placed.

## 2016-08-18 NOTE — Progress Notes (Signed)
Patient requested that CPAP be setup and left at bedside.  CPAP set for auto titrate.  RT will continue to monitor.

## 2016-08-18 NOTE — Progress Notes (Signed)
Pt is alert and oriented 2-3 with some Random thoughts at baseline, states that he was having vivid dreams. Explained safety protocol, states he understand, Bed alarm on.

## 2016-08-18 NOTE — Progress Notes (Signed)
Progress Note  Patient Name: Mark [name] Date of Encounter: 08/18/2016  Primary Cardiologist: Dr. Stanford Breed  Subjective   No cardiac complaints Eating breakfast   Inpatient Medications    Scheduled Meds: . atorvastatin  40 mg Oral q1800  . carvedilol  12.5 mg Oral BID WC  . famotidine  20 mg Oral Daily  . feeding supplement (ENSURE ENLIVE)  237 mL Oral BID BM  . furosemide  60 mg Intravenous BID  . isosorbide dinitrate  20 mg Oral TID  . losartan  50 mg Oral QPM  . potassium chloride  20 mEq Oral QPM  . sertraline  100 mg Oral QPM  . sodium chloride flush  3 mL Intravenous Q12H  . tamsulosin  0.4 mg Oral Daily  . traZODone  50 mg Oral QHS  . Warfarin - Pharmacist Dosing Inpatient   Does not apply q1800   Continuous Infusions:  PRN Meds: sodium chloride, acetaminophen **OR** acetaminophen, ondansetron **OR** ondansetron (ZOFRAN) IV, sodium chloride flush   Vital Signs    Vitals:   08/17/16 1559 08/17/16 1939 08/18/16 0143 08/18/16 0406  BP:  (!) 121/91 112/76 126/83  Pulse:  88 82 88  Resp:  18 18 18   Temp:  98.1 F (36.7 C) 98.4 F (36.9 C) 98.2 F (36.8 C)  TempSrc:  Oral Oral Oral  SpO2:  98% 95% 96%  Weight: 217 lb 9.6 oz (98.7 kg)   216 lb 3.2 oz (98.1 kg)  Height:        Intake/Output Summary (Last 24 hours) at 08/18/16 0836 Last data filed at 08/18/16 8341  Gross per 24 hour  Intake             1280 ml  Output             1925 ml  Net             -645 ml   Filed Weights   08/17/16 0500 08/17/16 1559 08/18/16 0406  Weight: 216 lb (98 kg) 217 lb 9.6 oz (98.7 kg) 216 lb 3.2 oz (98.1 kg)    Telemetry    Atrial fib rates controlled, run of what appears to be possible NSVT yesterday PM- Personally Reviewed   Physical Exam   BP 126/83 (BP Location: Left Arm)   Pulse 88   Temp 98.2 F (36.8 C) (Oral)   Resp 18   Ht 5' 11.5" (1.816 m)   Wt 216 lb 3.2 oz (98.1 kg) Comment: scale c  SpO2 96%   BMI 29.73 kg/m  Affect appropriate Healthy:   appears stated age HEENT: normal Neck supple with no adenopathy JVP normal no bruits no thyromegaly Lungs clear with no wheezing and good diaphragmatic motion Heart:  S1/S2 no murmur, no rub, gallop or click PMI normal Abdomen: benighn, BS positve, no tenderness, no AAA no bruit.  No HSM or HJR Distal pulses intact with no bruits No edema Neuro non-focal Skin warm and dry No muscular weakness   Labs    Chemistry Recent Labs Lab 08/14/16 1023  08/16/16 0203 08/17/16 0205 08/18/16 0354  NA 142  < > 141 141 141  K 3.3*  < > 3.3* 3.4* 3.2*  CL 110  < > 106 107 106  CO2 24  < > 23 26 26   GLUCOSE 96  < > 95 111* 107*  BUN 25*  < > 24* 26* 27*  CREATININE 2.01*  < > 2.01* 1.98* 1.83*  CALCIUM 8.8*  < >  8.5* 8.4* 8.5*  PROT 6.4*  --  5.7*  --   --   ALBUMIN 3.6  --  3.2*  --   --   AST 20  --  18  --   --   ALT 14*  --  11*  --   --   ALKPHOS 77  --  66  --   --   BILITOT 1.7*  --  1.2  --   --   GFRNONAA 31*  < > 31* 32* 35*  GFRAA 36*  < > 36* 37* 41*  ANIONGAP 8  < > 12 8 9   < > = values in this interval not displayed.   Hematology  Recent Labs Lab 08/14/16 1023 08/15/16 0329 08/18/16 0354  WBC 4.9 6.6 5.4  RBC 3.82* 3.76* 3.67*  HGB 12.0* 11.5* 11.1*  HCT 36.3* 35.4* 34.9*  MCV 95.0 94.1 95.1  MCH 31.4 30.6 30.2  MCHC 33.1 32.5 31.8  RDW 16.7* 16.7* 16.4*  PLT 153 143* 143*    Cardiac Enzymes  Recent Labs Lab 08/14/16 1024 08/14/16 1757 08/14/16 2257  TROPONINI <0.03 <0.03 <0.03   No results for input(s): TROPIPOC in the last 168 hours.   BNP  Recent Labs Lab 08/14/16 1047  BNP 864.3*     DDimer No results for input(s): DDIMER in the last 168 hours.   Radiology    No results found.  Cardiac Studies   2D Echo 08/15/16 Study Conclusions - Left ventricle: The cavity size was mildly dilated. Wall thickness was increased in a pattern of mild LVH. There was moderate focal basal hypertrophy of the septum. Systolic function was  severely reduced. The estimated ejection fraction was in the range of 20% to 25%. Diffuse hypokinesis. The study is not technically sufficient to allow evaluation of LV diastolic function. - Aortic valve: There was mild regurgitation. - Aortic root: The aortic root was mildly dilated. - Ascending aorta: The ascending aorta was mildly dilated. - Mitral valve: There was moderate regurgitation. - Left atrium: The atrium was severely dilated. - Right ventricle: The cavity size was mildly dilated. - Right atrium: The atrium was moderately dilated. Impressions: - Severe LV systolic dysfunction; mild AI; mildly dilated aortic root (4.6 cm; suggest CTA or MRA to further assess); moderate MR; biatrial enlargement; mild RVE; mild TR.   Patient Profile     76 y.o. 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 We are asked to see for decreased EF and atrial fib. Last nuc 2014 was normal, prior EF 55% but now 20-25%, also with new LBBB.   Assessment & Plan    1. Acute encephalopathy - does not appear to be related at all to cardiac status. Being evaluated by IM, ID. Of note patient has lost about 25lb since November as well. Per ex wife, poor living situation. Neuro feels its dementia exacerbation no plans for LP  2. Acute systolic CHF - etiology not clear at this time, does not seem to be related to #1. Norvasc d/c will change to PO lasix   3. Permanent atrial fib with elevated HR at times (80s-120s) - HR currently 80s. Increase coreg dose  4.  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 Continue beta  blocker, statin. Will need to consider cath once his mental status changes are sorted out.  6. CKD stage III-IV with hypokalemia - being followed daily. See below re: K.  7. Dilated aortic root - will need to be followed, also noted on echo in 2017.  8. NSVT - resolved with K     Jenkins Rouge

## 2016-08-18 NOTE — Progress Notes (Signed)
[facility] Progress Note  Patient: Mark [name] OZH:[id number]   PCP: Nance Pear., NP DOB: [birth year]   DOA: 08/14/2016   DOS: 08/18/2016   Date of Service: the patient was seen and examined on 08/18/2016  Subjective: No acute events.  Brief hospital course: Pt. with PMH of AAA, CAD, GERD, renal cell carcinoma S/P nephrectomy, HTN, A. fib; admitted on 08/14/2016, with complaint of confusion, was found to have acute encephalopathy as well as acute systolic CHF with acute respiratory failure. Currently further plan is current management.  Assessment and Plan: Acute encephalopathy:  -predominantly hallucinations and vivid dreams -Hypoxemia vs intracranial process not yet visualized- MRI brain negative -CT head w/o acute process -UDS negative- ? Alcohol use -h/o renal cell cancer No significant metabolic derangements. Question psych etiology. No sedating medications.  - per family patient is a [employer] and behavior has been progressively getting more agitated and bizarre -note from [name] on 11/17 patient reported vivid dreams -ammonia normal, TSH normal, B12 normal, RPR non reactive undiagnosed dementia with progression is the most likely etiology. Discussed with neurology recommends no further workup at present. Discussed with psychiatry, patient related outpatient psychiatric referral, placed in [place].  Acute respiratory failure: LBBB Acute on chronic systolic CHF, moderate MR. Previous echo w/ nml EF and evidence of diastolic chf. CXR w/o evidence of infectious process. BNP 84. Troponin.01. Lasix 60 IV x1 in ED at [facility] ([facility]). ABG pH 7.4, PCO2 29, P O2 64, CO2 21.  - Cardiology consulted, was on Lasix 40 IV BID, transitioning to oral Lasix. - Cardiology Recommended further workup for tickborne illnesses although Borrelia titers negative. ID recommended discontinuing antibiotics. - Changed metoprolol to Coreg. - Monitor overnight, would like to be able to discharge  home tomorrow.  CKD: Cr 2.0. Near baseline. s/p nephrectomy - BMET daily  HTN: - resumed losartan, stopped norvasc, continue Coreg, isosorbide  BPH: - continue flomax  GERD: - contineu pepcid  Depression:  - continue zoloft- no change in dose per patient  HLD: - continue lipitor  Afib: - tele - continue bblocker, coumadin -it appears from review of chart that patient was not compliant with getting INR checked as directed  AAA: - f/u outpt. Pt due for his yearly appt and imaging.   Bowel regimen: last BM 08/17/2016 Diet: cardiac diet DVT Prophylaxis: warfarin  Advance goals of care discussion: full code  Family Communication: no family was present at bedside, at the time of interview.   Disposition:  Discharge to home. Expected discharge date: 08/19/2016, stabilization CHF  Consultants: cardiology, infectious disease Procedures: Echocardiogram   Antibiotics: Anti-infectives    Start     Dose/Rate Route Frequency Ordered Stop   08/16/16 1600  cefTRIAXone (ROCEPHIN) 2 g in dextrose 5 % 50 mL IVPB  Status:  Discontinued     2 g 100 mL/hr over 30 Minutes Intravenous Every 24 hours 08/16/16 1552 08/17/16 0944       Objective: Physical Exam: Vitals:   08/18/16 0143 08/18/16 0406 08/18/16 1200 08/18/16 1309  BP: 112/76 126/83 121/86 128/80  Pulse: 82 88 78 90  Resp: 18 18 20 18   Temp: 98.4 F (36.9 C) 98.2 F (36.8 C) 97.6 F (36.4 C)   TempSrc: Oral Oral Oral Oral  SpO2: 95% 96% 91% 95%  Weight:  98.1 kg (216 lb 3.2 oz)    Height:        Intake/Output Summary (Last 24 hours) at 08/18/16 1608 Last data filed at 08/18/16 1254  Gross per  24 hour  Intake             1040 ml  Output             2325 ml  Net            -1285 ml   Filed Weights   08/17/16 0500 08/17/16 1559 08/18/16 0406  Weight: 98 kg (216 lb) 98.7 kg (217 lb 9.6 oz) 98.1 kg (216 lb 3.2 oz)   General: Alert, Awake and Oriented to Time, Place and Person. Appear in mild  distress, affect appropriate Eyes: PERRL, Conjunctiva normal ENT: Oral Mucosa clear moist. Neck: no JVD, no Abnormal Mass Or lumps Cardiovascular: S1 and S2 Present, no Murmur, Respiratory: Bilateral Air entry equal and Decreased, no use of accessory muscle, Clear to Auscultation, no Crackles, no wheezes Abdomen: Bowel Sound present, Soft and no tenderness Skin: no redness, no Rash, no induration Extremities: no Pedal edema, no calf tenderness Neurologic: Grossly no focal neuro deficit. Bilaterally Equal motor strength  Data Reviewed: CBC:  Recent Labs Lab 08/14/16 1023 08/15/16 0329 08/18/16 0354  WBC 4.9 6.6 5.4  NEUTROABS 3.8  --  3.7  HGB 12.0* 11.5* 11.1*  HCT 36.3* 35.4* 34.9*  MCV 95.0 94.1 95.1  PLT 153 143* 353*   Basic Metabolic Panel:  Recent Labs Lab 08/14/16 1023 08/14/16 1757 08/15/16 0329 08/16/16 0203 08/17/16 0205 08/17/16 1000 08/18/16 0354  NA 142  --  141 141 141  --  141  K 3.3*  --  3.3* 3.3* 3.4*  --  3.2*  CL 110  --  108 106 107  --  106  CO2 24  --  23 23 26   --  26  GLUCOSE 96  --  99 95 111*  --  107*  BUN 25*  --  24* 24* 26*  --  27*  CREATININE 2.01*  --  2.05* 2.01* 1.98*  --  1.83*  CALCIUM 8.8*  --  8.6* 8.5* 8.4*  --  8.5*  MG  --  2.1  --   --   --  2.0  --   PHOS  --  3.3  --   --   --   --   --     Liver Function Tests:  Recent Labs Lab 08/14/16 1023 08/16/16 0203  AST 20 18  ALT 14* 11*  ALKPHOS 77 66  BILITOT 1.7* 1.2  PROT 6.4* 5.7*  ALBUMIN 3.6 3.2*   No results for input(s): LIPASE, AMYLASE in the last 168 hours.  Recent Labs Lab 08/14/16 1303  AMMONIA 13   Coagulation Profile:  Recent Labs Lab 08/14/16 1757 08/15/16 0329 08/16/16 0203 08/17/16 0205 08/18/16 0354  INR 2.80 2.96 3.15 3.32 2.26   Cardiac Enzymes:  Recent Labs Lab 08/14/16 1024 08/14/16 1757 08/14/16 2257  TROPONINI <0.03 <0.03 <0.03   BNP (last 3 results) No results for input(s): PROBNP in the last 8760  hours. CBG:  Recent Labs Lab 08/15/16 0234 08/18/16 0734 08/18/16 1147  GLUCAP 92 97 101*   Studies: No results found.  Scheduled Meds: . atorvastatin  40 mg Oral q1800  . carvedilol  25 mg Oral BID WC  . famotidine  20 mg Oral Daily  . feeding supplement (ENSURE ENLIVE)  237 mL Oral BID BM  . furosemide  60 mg Oral BID  . isosorbide dinitrate  20 mg Oral TID  . losartan  50 mg Oral QPM  . potassium chloride  20 mEq  Oral QPM  . sertraline  100 mg Oral QPM  . sodium chloride flush  3 mL Intravenous Q12H  . tamsulosin  0.4 mg Oral Daily  . traZODone  50 mg Oral QHS  . warfarin  5 mg Oral ONCE-1800  . Warfarin - Pharmacist Dosing Inpatient   Does not apply q1800   Continuous Infusions: PRN Meds: sodium chloride, acetaminophen **OR** acetaminophen, ondansetron **OR** ondansetron (ZOFRAN) IV, sodium chloride flush  Time spent: 30 minutes  Author: Berle Mull, MD [facility] Pager: [phone] 08/18/2016 4:08 PM  If 7PM-7AM, please contact night-coverage at [email or web address], password [email or web address]

## 2016-08-18 NOTE — Discharge Instructions (Signed)

## 2016-08-18 NOTE — Care Management Important Message (Signed)
Important Message  Patient Details  Name: Mark [name] MRN: [id number] Date of Birth: [birth year]   Medicare Important Message Given:  Yes    Orbie Pyo 08/18/2016, 2:19 PM

## 2016-08-19 DIAGNOSIS — N400 Enlarged prostate without lower urinary tract symptoms: Secondary | ICD-10-CM

## 2016-08-19 DIAGNOSIS — I447 Left bundle-branch block, unspecified: Secondary | ICD-10-CM

## 2016-08-19 DIAGNOSIS — R0602 Shortness of breath: Secondary | ICD-10-CM

## 2016-08-19 DIAGNOSIS — F0281 Dementia in other diseases classified elsewhere with behavioral disturbance: Secondary | ICD-10-CM

## 2016-08-19 DIAGNOSIS — I4891 Unspecified atrial fibrillation: Secondary | ICD-10-CM

## 2016-08-19 DIAGNOSIS — G308 Other Alzheimer's disease: Secondary | ICD-10-CM

## 2016-08-19 LAB — BASIC METABOLIC PANEL
ANION GAP: 10 (ref 5–15)
BUN: 30 mg/dL — ABNORMAL HIGH (ref 6–20)
CALCIUM: 8.6 mg/dL — AB (ref 8.9–10.3)
CO2: 28 mmol/L (ref 22–32)
Chloride: 103 mmol/L (ref 101–111)
Creatinine, Ser: 1.83 mg/dL — ABNORMAL HIGH (ref 0.61–1.24)
GFR, EST AFRICAN AMERICAN: 41 mL/min — AB (ref >60)
GFR, EST NON AFRICAN AMERICAN: 35 mL/min — AB (ref >60)
GLUCOSE: 118 mg/dL — AB (ref 65–99)
POTASSIUM: 3.3 mmol/L — AB (ref 3.5–5.1)
SODIUM: 141 mmol/L (ref 135–145)

## 2016-08-19 LAB — PROTIME-INR
INR: 1.8
PROTHROMBIN TIME: 21.2 s — AB (ref 11.4–15.2)

## 2016-08-19 MED ORDER — WARFARIN SODIUM 5 MG PO TABS
5.0000 mg | ORAL_TABLET | Freq: Once | ORAL | Status: AC
  Administered 2016-08-19: 5 mg via ORAL
  Filled 2016-08-19: qty 1

## 2016-08-19 MED ORDER — POTASSIUM CHLORIDE CRYS ER 20 MEQ PO TBCR
40.0000 meq | EXTENDED_RELEASE_TABLET | Freq: Once | ORAL | Status: AC
Start: 2016-08-19 — End: 2016-08-19
  Administered 2016-08-19: 40 meq via ORAL
  Filled 2016-08-19: qty 2

## 2016-08-19 NOTE — Progress Notes (Signed)
PROGRESS NOTE    Mark [name]  LYY:[id number] DOB: 12-19-1940 DOA: 08/14/2016 PCP: Nance Pear., NP   Chief Complaint  Patient presents with  . Altered Mental Status     Brief Narrative:  Pt. with PMH of AAA, CAD, GERD, renal cell carcinoma S/P nephrectomy, HTN, A. fib; admitted on 08/14/2016, with complaint of confusion, was found to have acute encephalopathy as well as acute systolic CHF with acute respiratory failure. Currently further plan is current management.  Assessment & Plan   Acute encephalopathy -predominantly hallucinations and vivid dreams -Hypoxemia vs intracranial process not yet visualized- MRI brain negative -CT head w/o acute process -UDS negative- ? Alcohol use -h/o renal cell cancer No significant metabolic derangements. Question psych etiology. No sedating medications.  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 Discussed with neurology recommends no further workup at present. -Case discussed with psychiatry, patient related outpatient psychiatric referral, placed in Epic  Acute respiratory failure/LBBB/Acute on chronic systolic CHF, moderate MR -Previous echo w/ nml EF and evidence of diastolic chf. -CXR w/o evidence of infectious process. BNP 84. Troponin.01. Lasix 60 IV x1 in ED at [facility]. ABG pH 7.4, PCO2 29, P O2 64, CO2 21.  -Cardiology consulted and appreciated, was on Lasix 40 IV BID, transitioned to oral Lasix. -Cardiology Recommended further workup for tickborne illnesses although Borrelia titers negative.  -Cardiology would want to consider cath once mental status has been sorted out -ID consulted and appreciated recommended  discontinuing antibiotics. -Changed metoprolol to Coreg. -Monitor overnight, would like to be able to discharge  -Patient does not feel he is ready to go home today  Chronic kidney disease, stage III -Baseline creatinine approximately 1.8-1.9, presented with creatinine of 2.05 -Creatinine currently 1.83, continue to monitor BMP  Essential hypertension -resumed losartan, Coreg, isosorbide -Norvasc discontinued  BPH -continue flomax  GERD -continue pepcid  Depression -continue zoloft- no change in dose per patient  Hyperlipidemia -continue lipitor  Atrial fibrillation -continue bblocker, coumadin -it appears from review of chart that patient was not compliant with getting INR checked as directed  AAA -f/u outpt. Pt due for his yearly appt and imaging.   DVT Prophylaxis  Coumadin  Code Status: Full  Family Communication: None at bedside. Left message for son as well as friend  Disposition Plan: Admitted. Patient does not feel he is ready to be discharged today. Will continue to treat CHF and pending further cardiology recommendations.   Consultants Cardiology Infectious disease Neurology, via phone  Procedures  EEG  Antibiotics   Anti-infectives    Start     Dose/Rate Route Frequency Ordered Stop   08/16/16 1600  cefTRIAXone (ROCEPHIN) 2 g in dextrose 5 % 50 mL IVPB  Status:  Discontinued     2 g 100 mL/hr over 30 Minutes Intravenous Every 24 hours 08/16/16 1552 08/17/16 0944      Subjective:   Mark [name] seen and examined today.  Patient feels uneasy about going home. States he is not feeling ready. States he is not having further visual hallucinations however remembers having them. Currently denies any chest pain, shortness of breath, abdominal pain, nausea or vomiting, dizziness or headache.  Objective:   Vitals:   08/18/16 1309 08/18/16 2008 08/18/16  2342 08/19/16 0530  BP: 128/80 121/81 129/78 120/81  Pulse: 90 93 73 81  Resp: 18 18 (!)  22 20  Temp:  97.7 F (36.5 C) 97.8 F (36.6 C) 97.7 F (36.5 C)  TempSrc: Oral Oral Oral Oral  SpO2: 95% 92% 93% 92%  Weight:    98.4 kg (217 lb)  Height:        Intake/Output Summary (Last 24 hours) at 08/19/16 1036 Last data filed at 08/19/16 0810  Gross per 24 hour  Intake              723 ml  Output             1550 ml  Net             -827 ml   Filed Weights   08/17/16 1559 08/18/16 0406 08/19/16 0530  Weight: 98.7 kg (217 lb 9.6 oz) 98.1 kg (216 lb 3.2 oz) 98.4 kg (217 lb)    Exam  General: Well developed, well nourished, NAD, appears stated age  HEENT: NCAT, mucous membranes moist.   Cardiovascular: S1 S2 auscultated, no rubs, murmurs or gallops. Regular rate and rhythm.  Respiratory: Clear to auscultation bilaterally with equal chest rise  Abdomen: Soft, nontender, nondistended, + bowel sounds  Extremities: warm dry without cyanosis clubbing or edema  Neuro: AAOx2, nonfocal  Skin: Without rashes exudates or nodules  Psych: Appropriate mood and affect   Data Reviewed: I have personally reviewed following labs and imaging studies  CBC:  Recent Labs Lab 08/14/16 1023 08/15/16 0329 08/18/16 0354  WBC 4.9 6.6 5.4  NEUTROABS 3.8  --  3.7  HGB 12.0* 11.5* 11.1*  HCT 36.3* 35.4* 34.9*  MCV 95.0 94.1 95.1  PLT 153 143* 778*   Basic Metabolic Panel:  Recent Labs Lab 08/14/16 1757 08/15/16 0329 08/16/16 0203 08/17/16 0205 08/17/16 1000 08/18/16 0354 08/19/16 0319  NA  --  141 141 141  --  141 141  K  --  3.3* 3.3* 3.4*  --  3.2* 3.3*  CL  --  108 106 107  --  106 103  CO2  --  23 23 26   --  26 28  GLUCOSE  --  99 95 111*  --  107* 118*  BUN  --  24* 24* 26*  --  27* 30*  CREATININE  --  2.05* 2.01* 1.98*  --  1.83* 1.83*  CALCIUM  --  8.6* 8.5* 8.4*  --  8.5* 8.6*  MG 2.1  --   --   --  2.0  --   --   PHOS 3.3  --   --   --   --   --   --    GFR: Estimated Creatinine Clearance: 43.4 mL/min (A) (by C-G formula based on SCr of 1.83 mg/dL  (H)). Liver Function Tests:  Recent Labs Lab 08/14/16 1023 08/16/16 0203  AST 20 18  ALT 14* 11*  ALKPHOS 77 66  BILITOT 1.7* 1.2  PROT 6.4* 5.7*  ALBUMIN 3.6 3.2*   No results for input(s): LIPASE, AMYLASE in the last 168 hours.  Recent Labs Lab 08/14/16 1303  AMMONIA 13   Coagulation Profile:  Recent Labs Lab 08/15/16 0329 08/16/16 0203 08/17/16 0205 08/18/16 0354 08/19/16 0319  INR 2.96 3.15 3.32 2.26 1.80   Cardiac Enzymes:  Recent Labs Lab 08/14/16 1024 08/14/16 1757 08/14/16 2257  TROPONINI <0.03 <0.03 <0.03   BNP (last 3 results) No results for input(s):  PROBNP in the last 8760 hours. HbA1C: No results for input(s): HGBA1C in the last 72 hours. CBG:  Recent Labs Lab 08/15/16 0234 08/18/16 0734 08/18/16 1147  GLUCAP 92 97 101*   Lipid Profile: No results for input(s): CHOL, HDL, LDLCALC, TRIG, CHOLHDL, LDLDIRECT in the last 72 hours. Thyroid Function Tests: No results for input(s): TSH, T4TOTAL, FREET4, T3FREE, THYROIDAB in the last 72 hours. Anemia Panel: No results for input(s): VITAMINB12, FOLATE, FERRITIN, TIBC, IRON, RETICCTPCT in the last 72 hours. Urine analysis:    Component Value Date/Time   COLORURINE YELLOW 08/14/2016 1142   APPEARANCEUR CLEAR 08/14/2016 1142   LABSPEC 1.018 08/14/2016 1142   PHURINE 5.5 08/14/2016 1142   GLUCOSEU NEGATIVE 08/14/2016 1142   GLUCOSEU NEGATIVE 07/17/2014 0945   HGBUR NEGATIVE 08/14/2016 1142   BILIRUBINUR NEGATIVE 08/14/2016 1142   KETONESUR 15 (A) 08/14/2016 1142   PROTEINUR 100 (A) 08/14/2016 1142   UROBILINOGEN 0.2 07/17/2014 0945   NITRITE NEGATIVE 08/14/2016 1142   LEUKOCYTESUR NEGATIVE 08/14/2016 1142   Sepsis Labs: @LABRCNTIP (procalcitonin:4,lacticidven:4)  ) Recent Results (from the past 240 hour(s))  MRSA PCR Screening     Status: None   Collection Time: 08/15/16  3:21 AM  Result Value Ref Range Status   MRSA by PCR NEGATIVE NEGATIVE Final    Comment:        The GeneXpert  MRSA Assay (FDA approved for NASAL specimens only), is one component of a comprehensive MRSA colonization surveillance program. It is not intended to diagnose MRSA infection nor to guide or monitor treatment for MRSA infections.       Radiology Studies: No results found.   Scheduled Meds: . atorvastatin  40 mg Oral q1800  . carvedilol  25 mg Oral BID WC  . famotidine  20 mg Oral Daily  . feeding supplement (ENSURE ENLIVE)  237 mL Oral BID BM  . furosemide  60 mg Oral BID  . isosorbide dinitrate  20 mg Oral TID  . losartan  50 mg Oral QPM  . potassium chloride  20 mEq Oral QPM  . sertraline  100 mg Oral QPM  . sodium chloride flush  3 mL Intravenous Q12H  . tamsulosin  0.4 mg Oral Daily  . traZODone  50 mg Oral QHS  . Warfarin - Pharmacist Dosing Inpatient   Does not apply q1800   Continuous Infusions:   LOS: 4 days   Time Spent in minutes   30 minutes  Carman Auxier D.O. on 08/19/2016 at 10:36 AM  Between 7am to 7pm - Pager - [phone]  After 7pm go to [email or web address] - password [email or web address]  And look for the night coverage person covering for me after hours  [facility] Office  [phone]

## 2016-08-19 NOTE — Progress Notes (Signed)
Progress Note  Patient Name: Mark [name] Date of Encounter: 08/19/2016  Primary Cardiologist:  Dr. Stanford Breed  Subjective   Confused and rambling but pleasant.  He has great stories.  He denies pain or SOB.   Inpatient Medications    Scheduled Meds: . atorvastatin  40 mg Oral q1800  . carvedilol  25 mg Oral BID WC  . famotidine  20 mg Oral Daily  . feeding supplement (ENSURE ENLIVE)  237 mL Oral BID BM  . furosemide  60 mg Oral BID  . isosorbide dinitrate  20 mg Oral TID  . losartan  50 mg Oral QPM  . potassium chloride  20 mEq Oral QPM  . sertraline  100 mg Oral QPM  . sodium chloride flush  3 mL Intravenous Q12H  . tamsulosin  0.4 mg Oral Daily  . traZODone  50 mg Oral QHS  . Warfarin - Pharmacist Dosing Inpatient   Does not apply q1800   Continuous Infusions:  PRN Meds: sodium chloride, acetaminophen **OR** acetaminophen, ondansetron **OR** ondansetron (ZOFRAN) IV, sodium chloride flush   Vital Signs    Vitals:   08/18/16 1309 08/18/16 2008 08/18/16 2342 08/19/16 0530  BP: 128/80 121/81 129/78 120/81  Pulse: 90 93 73 81  Resp: 18 18 (!) 22 20  Temp:  97.7 F (36.5 C) 97.8 F (36.6 C) 97.7 F (36.5 C)  TempSrc: Oral Oral Oral Oral  SpO2: 95% 92% 93% 92%  Weight:    217 lb (98.4 kg)  Height:        Intake/Output Summary (Last 24 hours) at 08/19/16 1050 Last data filed at 08/19/16 0810  Gross per 24 hour  Intake              723 ml  Output             1550 ml  Net             -827 ml   Filed Weights   08/17/16 1559 08/18/16 0406 08/19/16 0530  Weight: 217 lb 9.6 oz (98.7 kg) 216 lb 3.2 oz (98.1 kg) 217 lb (98.4 kg)    Telemetry    Atrial fib.  - Personally Reviewed  ECG    NA - Personally Reviewed  Physical Exam   GEN: No acute distress.   Neck: No  JVD Cardiac: IrregularRR, no murmurs, rubs, or gallops.  Respiratory: Clear  to auscultation bilaterally. GI: Soft, nontender, non-distended  MS: No  edema; No deformity. Neuro:  Nonfocal    Psych: Normal affect but obvious confusion.   Labs    Chemistry Recent Labs Lab 08/14/16 1023  08/16/16 0203 08/17/16 0205 08/18/16 0354 08/19/16 0319  NA 142  < > 141 141 141 141  K 3.3*  < > 3.3* 3.4* 3.2* 3.3*  CL 110  < > 106 107 106 103  CO2 24  < > 23 26 26 28   GLUCOSE 96  < > 95 111* 107* 118*  BUN 25*  < > 24* 26* 27* 30*  CREATININE 2.01*  < > 2.01* 1.98* 1.83* 1.83*  CALCIUM 8.8*  < > 8.5* 8.4* 8.5* 8.6*  PROT 6.4*  --  5.7*  --   --   --   ALBUMIN 3.6  --  3.2*  --   --   --   AST 20  --  18  --   --   --   ALT 14*  --  11*  --   --   --  ALKPHOS 77  --  66  --   --   --   BILITOT 1.7*  --  1.2  --   --   --   GFRNONAA 31*  < > 31* 32* 35* 35*  GFRAA 36*  < > 36* 37* 41* 41*  ANIONGAP 8  < > 12 8 9 10   < > = values in this interval not displayed.   Hematology Recent Labs Lab 08/14/16 1023 08/15/16 0329 08/18/16 0354  WBC 4.9 6.6 5.4  RBC 3.82* 3.76* 3.67*  HGB 12.0* 11.5* 11.1*  HCT 36.3* 35.4* 34.9*  MCV 95.0 94.1 95.1  MCH 31.4 30.6 30.2  MCHC 33.1 32.5 31.8  RDW 16.7* 16.7* 16.4*  PLT 153 143* 143*    Cardiac Enzymes Recent Labs Lab 08/14/16 1024 08/14/16 1757 08/14/16 2257  TROPONINI <0.03 <0.03 <0.03   No results for input(s): TROPIPOC in the last 168 hours.   BNP Recent Labs Lab 08/14/16 1047  BNP 864.3*     DDimer No results for input(s): DDIMER in the last 168 hours.   Radiology    No results found.  Cardiac Studies   ECHO:   - Left ventricle: The cavity size was mildly dilated. Wall   thickness was increased in a pattern of mild LVH. There was   moderate focal basal hypertrophy of the septum. Systolic function   was severely reduced. The estimated ejection fraction was in the   range of 20% to 25%. Diffuse hypokinesis. The study is not   technically sufficient to allow evaluation of LV diastolic   function. - Aortic valve: There was mild regurgitation. - Aortic root: The aortic root was mildly dilated. -  Ascending aorta: The ascending aorta was mildly dilated. - Mitral valve: There was moderate regurgitation. - Left atrium: The atrium was severely dilated. - Right ventricle: The cavity size was mildly dilated. - Right atrium: The atrium was moderately dilated.   Patient Profile     76 y.o. 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 We are asked to see for decreased EF and atrial fib. Last nuc 2014 was normal, prior EF 55% but now 20-25%, also with new LBBB.  Assessment & Plan    ACUTE SYSTOLIC HF:   Negative 1.1 liters yesterday and 7 liters overall.   Continue PO diuresis.    PERMANENT ATRIAL FIB:   Rate seems to be relatively well controlled.   Continue current meds and warfarin.   NEW LBBB:   Plan ischemia work up at some point.   However, I think we could consider further work up as an out patient.   CKD STAGE III - IV:  Creat is stable.    Signed, Minus Breeding, MD  08/19/2016, 10:50 AM

## 2016-08-19 NOTE — Progress Notes (Signed)
Patient refused the CPAP after trying the previous night wasn't comfortable. RT removed the CPAP from the room and advised patient if he changed his mind to advise RN and respiratory would come back and setup another CPAP.

## 2016-08-19 NOTE — Progress Notes (Signed)
Patient alert throughout shift in NAD. VSS.

## 2016-08-19 NOTE — Progress Notes (Signed)
ANTICOAGULATION CONSULT NOTE  Pharmacy Consult for Warfarin Indication: atrial fibrillation  Assessment: 76 yo male admitted 4/9 with confusion. Warfarin prior to admission for atrial fibrillation. Prior to admission dose 7.5 mg on Wednesday and 5 mg AOD's (INR on admission 2.86).   Today, INR has trended down to 1.8 (subtherapeutic) after 4/11 and 4/12 doses held. Hemoglobin and platelets are stable. No overt bleeding noted.  Goal of Therapy:  INR 2-3 Monitor platelets by anticoagulation protocol: Yes   Plan:  Warfarin 5 mg po x 1 dose tonight Daily INR   Allergies  Allergen Reactions  . Ace Inhibitors     REACTION: Cough    Patient Measurements: Height: 5' 11.5" (181.6 cm) Weight: 217 lb (98.4 kg) IBW/kg (Calculated) : 76.45  Vital Signs: Temp: 97.7 F (36.5 C) (04/14 0530) Temp Source: Oral (04/14 0530) BP: 120/81 (04/14 0530) Pulse Rate: 81 (04/14 0530)  Labs:  Recent Labs  08/17/16 0205 08/18/16 0354 08/19/16 0319  HGB  --  11.1*  --   HCT  --  34.9*  --   PLT  --  143*  --   LABPROT 34.4* 25.4* 21.2*  INR 3.32 2.26 1.80  CREATININE 1.98* 1.83* 1.83*   Belia Heman, PharmD PGY1 Pharmacy Resident [phone] (Pager) 08/19/2016 11:38 AM

## 2016-08-20 ENCOUNTER — Encounter (HOSPITAL_COMMUNITY): Payer: Self-pay | Admitting: Cardiology

## 2016-08-20 DIAGNOSIS — G309 Alzheimer's disease, unspecified: Secondary | ICD-10-CM

## 2016-08-20 LAB — BASIC METABOLIC PANEL
Anion gap: 9 (ref 5–15)
BUN: 36 mg/dL — ABNORMAL HIGH (ref 6–20)
CALCIUM: 8.6 mg/dL — AB (ref 8.9–10.3)
CO2: 27 mmol/L (ref 22–32)
CREATININE: 1.93 mg/dL — AB (ref 0.61–1.24)
Chloride: 106 mmol/L (ref 101–111)
GFR calc Af Amer: 38 mL/min — ABNORMAL LOW (ref >60)
GFR calc non Af Amer: 33 mL/min — ABNORMAL LOW (ref >60)
GLUCOSE: 109 mg/dL — AB (ref 65–99)
Potassium: 3.4 mmol/L — ABNORMAL LOW (ref 3.5–5.1)
Sodium: 142 mmol/L (ref 135–145)

## 2016-08-20 LAB — CBC
HCT: 34.9 % — ABNORMAL LOW (ref 39.0–52.0)
HEMOGLOBIN: 11.1 g/dL — AB (ref 13.0–17.0)
MCH: 29.9 pg (ref 26.0–34.0)
MCHC: 31.8 g/dL (ref 30.0–36.0)
MCV: 94.1 fL (ref 78.0–100.0)
PLATELETS: 151 10*3/uL (ref 150–400)
RBC: 3.71 MIL/uL — ABNORMAL LOW (ref 4.22–5.81)
RDW: 16.1 % — ABNORMAL HIGH (ref 11.5–15.5)
WBC: 5.4 10*3/uL (ref 4.0–10.5)

## 2016-08-20 LAB — PROTIME-INR
INR: 1.65
PROTHROMBIN TIME: 19.7 s — AB (ref 11.4–15.2)

## 2016-08-20 MED ORDER — POTASSIUM CHLORIDE CRYS ER 20 MEQ PO TBCR
40.0000 meq | EXTENDED_RELEASE_TABLET | Freq: Once | ORAL | Status: AC
  Administered 2016-08-20: 40 meq via ORAL
  Filled 2016-08-20: qty 2

## 2016-08-20 MED ORDER — WARFARIN SODIUM 7.5 MG PO TABS
7.5000 mg | ORAL_TABLET | Freq: Once | ORAL | Status: AC
  Administered 2016-08-20: 7.5 mg via ORAL
  Filled 2016-08-20: qty 1

## 2016-08-20 NOTE — Progress Notes (Signed)
ANTICOAGULATION CONSULT NOTE  Pharmacy Consult for Warfarin Indication: atrial fibrillation  Assessment: 76 yo male admitted 4/9 with confusion. Warfarin prior to admission for atrial fibrillation. Prior to admission dose 7.5 mg on Wednesday and 5 mg AOD's (INR on admission 2.86).   Today, INR has trended down to 1.65 (subtherapeutic) after 4/11 and 4/12 doses held in anticipation for LP that is no longer necessary. Hemoglobin and platelets are stable. No overt bleeding noted.  Goal of Therapy:  INR 2-3 Monitor platelets by anticoagulation protocol: Yes   Plan:  Warfarin 7.5 mg po x 1 dose tonight Daily INR   Allergies  Allergen Reactions  . Ace Inhibitors     REACTION: Cough    Patient Measurements: Height: 5' 11.5" (181.6 cm) Weight: 220 lb (99.8 kg) IBW/kg (Calculated) : 76.45  Vital Signs: Temp: 97.9 F (36.6 C) (04/15 0354) Temp Source: Oral (04/15 0354) BP: 133/95 (04/15 0354) Pulse Rate: 103 (04/15 0354)  Labs:  Recent Labs  08/18/16 0354 08/19/16 0319 08/20/16 0531  HGB 11.1*  --  11.1*  HCT 34.9*  --  34.9*  PLT 143*  --  151  LABPROT 25.4* 21.2* 19.7*  INR 2.26 1.80 1.65  CREATININE 1.83* 1.83* 1.93*   Belia Heman, PharmD PGY1 Pharmacy Resident [phone] (Pager) 08/20/2016 10:29 AM

## 2016-08-20 NOTE — Progress Notes (Signed)
Progress Note  Patient Name: Mark [name] Date of Encounter: 08/20/2016  Primary Cardiologist:  Dr. Stanford Breed  Subjective   No SOB.  Just finished a shower.  No distress  Inpatient Medications    Scheduled Meds: . atorvastatin  40 mg Oral q1800  . carvedilol  25 mg Oral BID WC  . famotidine  20 mg Oral Daily  . feeding supplement (ENSURE ENLIVE)  237 mL Oral BID BM  . furosemide  60 mg Oral BID  . isosorbide dinitrate  20 mg Oral TID  . losartan  50 mg Oral QPM  . potassium chloride  20 mEq Oral QPM  . sertraline  100 mg Oral QPM  . sodium chloride flush  3 mL Intravenous Q12H  . tamsulosin  0.4 mg Oral Daily  . traZODone  50 mg Oral QHS  . Warfarin - Pharmacist Dosing Inpatient   Does not apply q1800   Continuous Infusions:  PRN Meds: sodium chloride, acetaminophen **OR** acetaminophen, ondansetron **OR** ondansetron (ZOFRAN) IV, sodium chloride flush   Vital Signs    Vitals:   08/19/16 1200 08/19/16 2022 08/20/16 0300 08/20/16 0354  BP: 110/68 123/88  (!) 133/95  Pulse: 77 92  (!) 103  Resp: 20 20  20   Temp: 98.1 F (36.7 C) 97.3 F (36.3 C)  97.9 F (36.6 C)  TempSrc: Oral Oral  Oral  SpO2: 97% 93%  93%  Weight:   220 lb (99.8 kg)   Height:        Intake/Output Summary (Last 24 hours) at 08/20/16 1015 Last data filed at 08/20/16 0940  Gross per 24 hour  Intake              803 ml  Output             1400 ml  Net             -597 ml   Filed Weights   08/18/16 0406 08/19/16 0530 08/20/16 0300  Weight: 216 lb 3.2 oz (98.1 kg) 217 lb (98.4 kg) 220 lb (99.8 kg)    Telemetry    Atrial fib.  - Personally Reviewed  ECG    NA - Personally Reviewed  Physical Exam   GEN: No acute distress.  Pleasant Neck: No  JVD  Cardiac: IrregularRR, no murmurs, rubs, or gallops. Exam unchanged Respiratory: Clear  to auscultation bilaterally. GI: Soft, nontender, non-distended  MS: No  edema; No deformity. Neuro:  Nonfocal  Psych: Normal affect but obvious  confusion.   Labs    Chemistry Recent Labs Lab 08/14/16 1023  08/16/16 0203  08/18/16 0354 08/19/16 0319 08/20/16 0531  NA 142  < > 141  < > 141 141 142  K 3.3*  < > 3.3*  < > 3.2* 3.3* 3.4*  CL 110  < > 106  < > 106 103 106  CO2 24  < > 23  < > 26 28 27   GLUCOSE 96  < > 95  < > 107* 118* 109*  BUN 25*  < > 24*  < > 27* 30* 36*  CREATININE 2.01*  < > 2.01*  < > 1.83* 1.83* 1.93*  CALCIUM 8.8*  < > 8.5*  < > 8.5* 8.6* 8.6*  PROT 6.4*  --  5.7*  --   --   --   --   ALBUMIN 3.6  --  3.2*  --   --   --   --   AST 20  --  18  --   --   --   --   ALT 14*  --  11*  --   --   --   --   ALKPHOS 77  --  66  --   --   --   --   BILITOT 1.7*  --  1.2  --   --   --   --   GFRNONAA 31*  < > 31*  < > 35* 35* 33*  GFRAA 36*  < > 36*  < > 41* 41* 38*  ANIONGAP 8  < > 12  < > 9 10 9   < > = values in this interval not displayed.   Hematology  Recent Labs Lab 08/15/16 0329 08/18/16 0354 08/20/16 0531  WBC 6.6 5.4 5.4  RBC 3.76* 3.67* 3.71*  HGB 11.5* 11.1* 11.1*  HCT 35.4* 34.9* 34.9*  MCV 94.1 95.1 94.1  MCH 30.6 30.2 29.9  MCHC 32.5 31.8 31.8  RDW 16.7* 16.4* 16.1*  PLT 143* 143* 151    Cardiac Enzymes  Recent Labs Lab 08/14/16 1024 08/14/16 1757 08/14/16 2257  TROPONINI <0.03 <0.03 <0.03   No results for input(s): TROPIPOC in the last 168 hours.   BNP  Recent Labs Lab 08/14/16 1047  BNP 864.3*     DDimer No results for input(s): DDIMER in the last 168 hours.   Radiology    No results found.  Cardiac Studies   ECHO:   - Left ventricle: The cavity size was mildly dilated. Wall   thickness was increased in a pattern of mild LVH. There was   moderate focal basal hypertrophy of the septum. Systolic function   was severely reduced. The estimated ejection fraction was in the   range of 20% to 25%. Diffuse hypokinesis. The study is not   technically sufficient to allow evaluation of LV diastolic   function. - Aortic valve: There was mild regurgitation. -  Aortic root: The aortic root was mildly dilated. - Ascending aorta: The ascending aorta was mildly dilated. - Mitral valve: There was moderate regurgitation. - Left atrium: The atrium was severely dilated. - Right ventricle: The cavity size was mildly dilated. - Right atrium: The atrium was moderately dilated.   Patient Profile     76 y.o. 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 We are asked to see for decreased EF and atrial fib. Last nuc 2014 was normal, prior EF 55% but now 20-25%, also with new LBBB.  Assessment & Plan    ACUTE SYSTOLIC HF:   Negative 0.6  liters yesterday.  Down about 7.7 liters overall.  Continue PO diuresis.  PERMANENT ATRIAL FIB:   Rate seems to be relatively well controlled.   Continue current meds and warfarin.   I would recommend to the discharging team that they contact the primary provider to talk about warfarin followup.   As I look through the chart INRs have been few and far between though thankfully therapeutic on admission.  He has missed scheduled followup.    NEW LBBB:   Plan ischemia work up at some point.   However, I think we could consider further work up as an  out patient.  For now he is not particularly symptomatic.  Given the other comorbid conditions I might suggest to Dr. Stanford Breed a noninvasive evaluation as an outpatient and I sent this message to him.    CKD STAGE III - IV:  Creat is stable.    We will see as needed in the hospital.       Signed, Minus Breeding, MD  08/20/2016, 10:15 AM

## 2016-08-20 NOTE — Progress Notes (Signed)
PROGRESS NOTE    Mark [name]  WCB:[id number] DOB: 02-10-1941 DOA: 08/14/2016 PCP: Nance Pear., NP   Chief Complaint  Patient presents with  . Altered Mental Status     Brief Narrative:  Pt. with PMH of AAA, CAD, GERD, renal cell carcinoma S/P nephrectomy, HTN, A. fib; admitted on 08/14/2016, with complaint of confusion, was found to have acute encephalopathy as well as acute systolic CHF with acute respiratory failure. Currently further plan is current management.  Discharge complicated by home/social situation.   Assessment & Plan   Acute encephalopathy -predominantly hallucinations and vivid dreams -Hypoxemia vs intracranial process not yet visualized- MRI brain negative -CT head w/o acute process -UDS negative- ? Alcohol use -h/o renal cell cancer No significant metabolic derangements. Question psych etiology. No sedating medications.  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 Discussed with neurology recommends no further workup at present. -Case discussed with psychiatry, patient related outpatient psychiatric referral, placed in Epic  Acute respiratory failure/LBBB/Acute on chronic systolic CHF, moderate MR -Previous echo w/ nml EF and evidence of diastolic chf. -CXR w/o evidence of infectious process. BNP 84. Troponin.01. Lasix 60 IV x1 in ED at [facility]. ABG pH 7.4, PCO2 29, P O2 64, CO2 21.  -Cardiology consulted and appreciated, was on Lasix 40 IV BID, transitioned to oral Lasix. -Cardiology Recommended further workup for tickborne illnesses although Borrelia titers negative.  -Cardiology would want to consider cath once mental status has been  sorted out, likely as an outpatient -ID consulted and appreciated recommended discontinuing antibiotics. -Changed metoprolol to Coreg.  Chronic kidney disease, stage III -Baseline creatinine approximately 1.8-1.9, presented with creatinine of 2.05 -Creatinine currently 1.93, continue to monitor BMP  Essential hypertension -resumed losartan, Coreg, isosorbide -Norvasc discontinued  BPH -continue flomax  GERD -continue pepcid  Depression -continue zoloft- no change in dose per patient  Hyperlipidemia -continue lipitor  Atrial fibrillation -continue bblocker, coumadin (INR 1.65 subtherapeutic- spoke to pharmacy, increased dose today) -it appears from review of chart that patient was not compliant with getting INR checked as directed  AAA -f/u outpt. Pt due for his yearly appt and imaging.   Social issues -Spoke with Ex-wife/Friend. Patient has a complex social/home situation. Currently unable to get up to his [address] floor condo, which also has no working toilet, etc.  -Social work/Case management consulted  DVT Prophylaxis  Coumadin  Code Status: Full  Family Communication: None at bedside. Left message for son. Spoke with Ex-wife/Friend. Patient has a complex social/home situation. Currently unable to get up to his [address] floor condo, which also has no working toilet, etc.   Disposition Plan: Admitted. Patient does not feel he is ready to be discharged today. Will continue to treat CHF and pending further cardiology recommendations.   Consultants Cardiology Infectious disease Neurology, via phone  Procedures  EEG  Antibiotics   Anti-infectives    Start     Dose/Rate Route Frequency Ordered Stop   08/16/16 1600  cefTRIAXone (ROCEPHIN) 2 g in dextrose 5 % 50 mL IVPB  Status:  Discontinued     2 g 100 mL/hr over 30 Minutes Intravenous Every 24 hours 08/16/16 1552 08/17/16 0944      Subjective:   Mark [name] seen  and examined today.  Patient feels "ok" this  morning.  Feels he can go home eventually jsut not today. Continue to have very vivid dreams. Currently denies any chest pain, shortness of breath, abdominal pain, nausea or vomiting, dizziness or headache.  Objective:   Vitals:   08/19/16 1200 08/19/16 2022 08/20/16 0300 08/20/16 0354  BP: 110/68 123/88  (!) 133/95  Pulse: 77 92  (!) 103  Resp: 20 20  20   Temp: 98.1 F (36.7 C) 97.3 F (36.3 C)  97.9 F (36.6 C)  TempSrc: Oral Oral  Oral  SpO2: 97% 93%  93%  Weight:   99.8 kg (220 lb)   Height:        Intake/Output Summary (Last 24 hours) at 08/20/16 1020 Last data filed at 08/20/16 0940  Gross per 24 hour  Intake              803 ml  Output             1400 ml  Net             -597 ml   Filed Weights   08/18/16 0406 08/19/16 0530 08/20/16 0300  Weight: 98.1 kg (216 lb 3.2 oz) 98.4 kg (217 lb) 99.8 kg (220 lb)    Exam  General: Well developed, well nourished, NAD, appears stated age  HEENT: NCAT, mucous membranes moist.   Cardiovascular: S1 S2 auscultated,no murmurs, irregular  Respiratory: Clear to auscultation bilaterally with equal chest rise  Abdomen: Soft, nontender, nondistended, + bowel sounds  Extremities: warm dry without cyanosis clubbing or edema  Neuro: AAOx2, nonfocal  Skin: Without rashes exudates or nodules  Psych: Appropriate mood and affect   Data Reviewed: I have personally reviewed following labs and imaging studies  CBC:  Recent Labs Lab 08/14/16 1023 08/15/16 0329 08/18/16 0354 08/20/16 0531  WBC 4.9 6.6 5.4 5.4  NEUTROABS 3.8  --  3.7  --   HGB 12.0* 11.5* 11.1* 11.1*  HCT 36.3* 35.4* 34.9* 34.9*  MCV 95.0 94.1 95.1 94.1  PLT 153 143* 143* 643   Basic Metabolic Panel:  Recent Labs Lab 08/14/16 1757  08/16/16 0203 08/17/16 0205 08/17/16 1000 08/18/16 0354 08/19/16 0319 08/20/16 0531  NA  --   < > 141 141  --  141 141 142  K  --   < > 3.3* 3.4*  --  3.2* 3.3* 3.4*  CL  --   < > 106 107  --  106 103 106  CO2  --    < > 23 26  --  26 28 27   GLUCOSE  --   < > 95 111*  --  107* 118* 109*  BUN  --   < > 24* 26*  --  27* 30* 36*  CREATININE  --   < > 2.01* 1.98*  --  1.83* 1.83* 1.93*  CALCIUM  --   < > 8.5* 8.4*  --  8.5* 8.6* 8.6*  MG 2.1  --   --   --  2.0  --   --   --   PHOS 3.3  --   --   --   --   --   --   --   < > = values in this interval not displayed. GFR: Estimated Creatinine Clearance: 41.4 mL/min (A) (by C-G formula based on SCr of 1.93 mg/dL (H)). Liver Function Tests:  Recent Labs Lab 08/14/16 1023 08/16/16 0203  AST 20 18  ALT  14* 11*  ALKPHOS 77 66  BILITOT 1.7* 1.2  PROT 6.4* 5.7*  ALBUMIN 3.6 3.2*   No results for input(s): LIPASE, AMYLASE in the last 168 hours.  Recent Labs Lab 08/14/16 1303  AMMONIA 13   Coagulation Profile:  Recent Labs Lab 08/16/16 0203 08/17/16 0205 08/18/16 0354 08/19/16 0319 08/20/16 0531  INR 3.15 3.32 2.26 1.80 1.65   Cardiac Enzymes:  Recent Labs Lab 08/14/16 1024 08/14/16 1757 08/14/16 2257  TROPONINI <0.03 <0.03 <0.03   BNP (last 3 results) No results for input(s): PROBNP in the last 8760 hours. HbA1C: No results for input(s): HGBA1C in the last 72 hours. CBG:  Recent Labs Lab 08/15/16 0234 08/18/16 0734 08/18/16 1147  GLUCAP 92 97 101*   Lipid Profile: No results for input(s): CHOL, HDL, LDLCALC, TRIG, CHOLHDL, LDLDIRECT in the last 72 hours. Thyroid Function Tests: No results for input(s): TSH, T4TOTAL, FREET4, T3FREE, THYROIDAB in the last 72 hours. Anemia Panel: No results for input(s): VITAMINB12, FOLATE, FERRITIN, TIBC, IRON, RETICCTPCT in the last 72 hours. Urine analysis:    Component Value Date/Time   COLORURINE YELLOW 08/14/2016 1142   APPEARANCEUR CLEAR 08/14/2016 1142   LABSPEC 1.018 08/14/2016 1142   PHURINE 5.5 08/14/2016 1142   GLUCOSEU NEGATIVE 08/14/2016 1142   GLUCOSEU NEGATIVE 07/17/2014 0945   HGBUR NEGATIVE 08/14/2016 1142   BILIRUBINUR NEGATIVE 08/14/2016 1142   KETONESUR 15 (A)  08/14/2016 1142   PROTEINUR 100 (A) 08/14/2016 1142   UROBILINOGEN 0.2 07/17/2014 0945   NITRITE NEGATIVE 08/14/2016 1142   LEUKOCYTESUR NEGATIVE 08/14/2016 1142   Sepsis Labs: @LABRCNTIP (procalcitonin:4,lacticidven:4)  ) Recent Results (from the past 240 hour(s))  MRSA PCR Screening     Status: None   Collection Time: 08/15/16  3:21 AM  Result Value Ref Range Status   MRSA by PCR NEGATIVE NEGATIVE Final    Comment:        The GeneXpert MRSA Assay (FDA approved for NASAL specimens only), is one component of a comprehensive MRSA colonization surveillance program. It is not intended to diagnose MRSA infection nor to guide or monitor treatment for MRSA infections.       Radiology Studies: No results found.   Scheduled Meds: . atorvastatin  40 mg Oral q1800  . carvedilol  25 mg Oral BID WC  . famotidine  20 mg Oral Daily  . feeding supplement (ENSURE ENLIVE)  237 mL Oral BID BM  . furosemide  60 mg Oral BID  . isosorbide dinitrate  20 mg Oral TID  . losartan  50 mg Oral QPM  . potassium chloride  20 mEq Oral QPM  . sertraline  100 mg Oral QPM  . sodium chloride flush  3 mL Intravenous Q12H  . tamsulosin  0.4 mg Oral Daily  . traZODone  50 mg Oral QHS  . Warfarin - Pharmacist Dosing Inpatient   Does not apply q1800   Continuous Infusions:   LOS: 5 days   Time Spent in minutes   30 minutes  Danaysha Kirn D.O. on 08/20/2016 at 10:20 AM  Between 7am to 7pm - Pager - [phone]  After 7pm go to [email or web address] - password [email or web address]  And look for the night coverage person covering for me after hours  [facility] Office  [phone]

## 2016-08-21 DIAGNOSIS — F4320 Adjustment disorder, unspecified: Secondary | ICD-10-CM

## 2016-08-21 DIAGNOSIS — R410 Disorientation, unspecified: Secondary | ICD-10-CM

## 2016-08-21 LAB — BASIC METABOLIC PANEL
ANION GAP: 5 (ref 5–15)
BUN: 27 mg/dL — ABNORMAL HIGH (ref 6–20)
CALCIUM: 8.4 mg/dL — AB (ref 8.9–10.3)
CO2: 28 mmol/L (ref 22–32)
Chloride: 107 mmol/L (ref 101–111)
Creatinine, Ser: 1.78 mg/dL — ABNORMAL HIGH (ref 0.61–1.24)
GFR calc Af Amer: 42 mL/min — ABNORMAL LOW (ref >60)
GFR, EST NON AFRICAN AMERICAN: 36 mL/min — AB (ref >60)
GLUCOSE: 131 mg/dL — AB (ref 65–99)
POTASSIUM: 3.5 mmol/L (ref 3.5–5.1)
Sodium: 140 mmol/L (ref 135–145)

## 2016-08-21 LAB — PROTIME-INR
INR: 1.6
Prothrombin Time: 19.2 seconds — ABNORMAL HIGH (ref 11.4–15.2)

## 2016-08-21 MED ORDER — WARFARIN SODIUM 7.5 MG PO TABS
7.5000 mg | ORAL_TABLET | Freq: Once | ORAL | Status: AC
  Administered 2016-08-21: 7.5 mg via ORAL
  Filled 2016-08-21: qty 1

## 2016-08-21 MED ORDER — ENSURE ENLIVE PO LIQD
237.0000 mL | ORAL | Status: DC
  Administered 2016-08-22: 237 mL via ORAL

## 2016-08-21 NOTE — Clinical Social Work Note (Signed)
CSW acknowledges SNF consult. PT recommended no PT follow up as patient walked 300 feet independently. Patient has no skillable needs at this time.  CSW signing off. Consult again if any other social work needs arise.  Dayton Scrape, [place]

## 2016-08-21 NOTE — Progress Notes (Signed)
Patient refused CPAP for the night  

## 2016-08-21 NOTE — Progress Notes (Signed)
Mark [name]  MHD:[id number] DOB: 07-10-1940 DOA: 08/14/2016 PCP: Nance Pear., NP   Chief Complaint  Patient presents with  . Altered Mental Status     Brief Narrative:  Pt. with PMH of AAA, CAD, GERD, renal cell carcinoma S/P nephrectomy, HTN, A. fib; admitted on 08/14/2016, with complaint of confusion, was found to have acute encephalopathy as well as acute systolic CHF with acute respiratory failure. Currently further plan is current management.  Discharge complicated by home/social situation.   Assessment & Plan   Acute encephalopathy -predominantly hallucinations and vivid dreams -Hypoxemia vs intracranial process not yet visualized- MRI brain negative -CT head w/o acute process -UDS negative- ? Alcohol use -h/o renal cell cancer No significant metabolic derangements. Question psych etiology. No sedating medications.  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 Discussed with neurology recommends no further workup at present. -Case discussed with psychiatry, Will follow the recommendation  Acute respiratory failure/LBBB/Acute on chronic systolic CHF, moderate MR -Previous echo w/ nml EF and evidence of diastolic chf. -CXR w/o evidence of infectious process. BNP 84. Troponin.01. Lasix 60 IV x1 in ED at [facility]. ABG pH 7.4, PCO2 29, P O2 64, CO2 21.  -Cardiology consulted and appreciated, was on Lasix 40 IV BID, transitioned to oral Lasix. -Cardiology Recommended further workup for tickborne illnesses although Borrelia titers negative.  -Cardiology would want to consider cath once mental status has been sorted out, likely as an  outpatient -ID consulted and appreciated recommended discontinuing antibiotics. -Changed metoprolol to Coreg.  Chronic kidney disease, stage III -Baseline creatinine approximately 1.8-1.9, presented with creatinine of 2.05 -Creatinine currently 1.78, continue to monitor BMP  Essential hypertension -resumed losartan, Coreg, isosorbide -Norvasc discontinued  BPH -continue flomax  GERD -continue pepcid  Depression -continue zoloft- no change in dose per patient  Hyperlipidemia -continue lipitor  Atrial fibrillation -continue bblocker, coumadin (INR 1.65 subtherapeutic- spoke to pharmacy, increased dose today) -it appears from review of chart that patient was not compliant with getting INR checked as directed  AAA -f/u outpt. Pt due for his yearly appt and imaging.   Social issues -Earlier provider Spoke with Ex-wife/Friend. Patient has a complex social/home situation. Currently unable to get up to his [address]rd floor condo, which also has no working toilet, etc.  -Social work/Case management consulted  DVT Prophylaxis  Coumadin  Code Status: Full  Family Communication: None at bedside.   Disposition Plan: Admitted. Await psychiatry recommendation   Consultants Cardiology Infectious disease Neurology, via phone  Procedures  EEG  Antibiotics   Anti-infectives    Start     Dose/Rate Route Frequency Ordered Stop   08/16/16 1600  cefTRIAXone (ROCEPHIN) 2 g in dextrose 5 % 50 mL IVPB  Status:  Discontinued     2 g 100 mL/hr over 30 Minutes Intravenous Every 24 hours 08/16/16 1552 08/17/16 0944      Subjective:   Brainard Highfill seen and examined today.  No complaints, hallucination is resolved.  Objective:   Vitals:   08/20/16 2001 08/21/16 0530 08/21/16 0828 08/21/16 1347  BP: 126/86 121/62 127/81 (!) 140/95  Pulse: 81 84 80 78  Resp: 20 18  20   Temp: 97.8 F (36.6 C) 97.5  F (36.4 C)  98.2 F (36.8 C)  TempSrc: Oral Tympanic  Oral  SpO2: 93% 94%   94%  Weight:  100.1 kg (220 lb 9.6 oz)    Height:        Intake/Output Summary (Last 24 hours) at 08/21/16 1550 Last data filed at 08/21/16 1348  Gross per 24 hour  Intake              980 ml  Output             1200 ml  Net             -220 ml   Filed Weights   08/19/16 0530 08/20/16 0300 08/21/16 0530  Weight: 98.4 kg (217 lb) 99.8 kg (220 lb) 100.1 kg (220 lb 9.6 oz)    Exam  General: Well developed, well nourished, NAD, appears stated age  76: NCAT, mucous membranes moist.   Cardiovascular: S1 S2 auscultated,no murmurs, irregular  Respiratory: Clear to auscultation bilaterally with equal chest rise  Abdomen: Soft, nontender, nondistended, + bowel sounds  Extremities: warm dry without cyanosis clubbing or edema  Neuro: AAOx2, nonfocal  Skin: Without rashes exudates or nodules  Psych: Appropriate mood and affect   Data Reviewed: I have personally reviewed following labs and imaging studies  CBC:  Recent Labs Lab 08/15/16 0329 08/18/16 0354 08/20/16 0531  WBC 6.6 5.4 5.4  NEUTROABS  --  3.7  --   HGB 11.5* 11.1* 11.1*  HCT 35.4* 34.9* 34.9*  MCV 94.1 95.1 94.1  PLT 143* 143* 235   Basic Metabolic Panel:  Recent Labs Lab 08/14/16 1757  08/17/16 0205 08/17/16 1000 08/18/16 0354 08/19/16 0319 08/20/16 0531 08/21/16 0351  NA  --   < > 141  --  141 141 142 140  K  --   < > 3.4*  --  3.2* 3.3* 3.4* 3.5  CL  --   < > 107  --  106 103 106 107  CO2  --   < > 26  --  26 28 27 28   GLUCOSE  --   < > 111*  --  107* 118* 109* 131*  BUN  --   < > 26*  --  27* 30* 36* 27*  CREATININE  --   < > 1.98*  --  1.83* 1.83* 1.93* 1.78*  CALCIUM  --   < > 8.4*  --  8.5* 8.6* 8.6* 8.4*  MG 2.1  --   --  2.0  --   --   --   --   PHOS 3.3  --   --   --   --   --   --   --   < > = values in this interval not displayed. GFR: Estimated Creatinine Clearance: 44.9 mL/min (A) (by C-G formula based on SCr of 1.78 mg/dL (H)). Liver Function Tests:  Recent Labs Lab  08/16/16 0203  AST 18  ALT 11*  ALKPHOS 66  BILITOT 1.2  PROT 5.7*  ALBUMIN 3.2*   No results for input(s): LIPASE, AMYLASE in the last 168 hours. No results for input(s): AMMONIA in the last 168 hours. Coagulation Profile:  Recent Labs Lab 08/17/16 0205 08/18/16 0354 08/19/16 0319 08/20/16 0531 08/21/16 0351  INR 3.32 2.26 1.80 1.65 1.60   Cardiac Enzymes:  Recent Labs Lab 08/14/16 1757 08/14/16 2257  TROPONINI <0.03 <0.03   BNP (last 3 results) No results for input(s): PROBNP in the last 8760 hours. HbA1C:  No results for input(s): HGBA1C in the last 72 hours. CBG:  Recent Labs Lab 08/15/16 0234 08/18/16 0734 08/18/16 1147  GLUCAP 92 97 101*   Lipid Profile: No results for input(s): CHOL, HDL, LDLCALC, TRIG, CHOLHDL, LDLDIRECT in the last 72 hours. Thyroid Function Tests: No results for input(s): TSH, T4TOTAL, FREET4, T3FREE, THYROIDAB in the last 72 hours. Anemia Panel: No results for input(s): VITAMINB12, FOLATE, FERRITIN, TIBC, IRON, RETICCTPCT in the last 72 hours. Urine analysis:    Component Value Date/Time   COLORURINE YELLOW 08/14/2016 1142   APPEARANCEUR CLEAR 08/14/2016 1142   LABSPEC 1.018 08/14/2016 1142   PHURINE 5.5 08/14/2016 1142   GLUCOSEU NEGATIVE 08/14/2016 1142   GLUCOSEU NEGATIVE 07/17/2014 0945   HGBUR NEGATIVE 08/14/2016 1142   BILIRUBINUR NEGATIVE 08/14/2016 1142   KETONESUR 15 (A) 08/14/2016 1142   PROTEINUR 100 (A) 08/14/2016 1142   UROBILINOGEN 0.2 07/17/2014 0945   NITRITE NEGATIVE 08/14/2016 1142   LEUKOCYTESUR NEGATIVE 08/14/2016 1142   Sepsis Labs: @LABRCNTIP (procalcitonin:4,lacticidven:4)  ) Recent Results (from the past 240 hour(s))  MRSA PCR Screening     Status: None   Collection Time: 08/15/16  3:21 AM  Result Value Ref Range Status   MRSA by PCR NEGATIVE NEGATIVE Final    Comment:        The GeneXpert MRSA Assay (FDA approved for NASAL specimens only), is one component of a comprehensive MRSA  colonization surveillance program. It is not intended to diagnose MRSA infection nor to guide or monitor treatment for MRSA infections.       Radiology Studies: No results found.   Scheduled Meds: . atorvastatin  40 mg Oral q1800  . carvedilol  25 mg Oral BID WC  . famotidine  20 mg Oral Daily  . [START ON 08/22/2016] feeding supplement (ENSURE ENLIVE)  237 mL Oral Q24H  . furosemide  60 mg Oral BID  . isosorbide dinitrate  20 mg Oral TID  . losartan  50 mg Oral QPM  . potassium chloride  20 mEq Oral QPM  . sertraline  100 mg Oral QPM  . sodium chloride flush  3 mL Intravenous Q12H  . tamsulosin  0.4 mg Oral Daily  . traZODone  50 mg Oral QHS  . warfarin  7.5 mg Oral ONCE-1800  . Warfarin - Pharmacist Dosing Inpatient   Does not apply q1800   Continuous Infusions:   LOS: 6 days   Time Spent in minutes   30 minutes  Author:  Berle Mull, MD [facility] Pager: [phone] 08/21/2016 3:51 PM     After 7pm go to [email or web address] - password [email or web address]  And look for the night coverage person covering for me after hours  [facility] Office  [phone]

## 2016-08-21 NOTE — Progress Notes (Signed)
Nutrition Follow-up  INTERVENTION:  Continue Ensure Enlive po once daily, each supplement provides 350 kcal and 20 grams of protein  NUTRITION DIAGNOSIS:   Predicted suboptimal nutrient intake related to other (see comment) (confusion) as evidenced by percent weight loss.  ongoing  GOAL:   Patient will meet greater than or equal to 90% of their needs  Being met  MONITOR:   PO intake, Supplement acceptance, Labs, Weight trends, Skin, I & O's  REASON FOR ASSESSMENT:   Malnutrition Screening Tool    ASSESSMENT:   Mark [name] is a 76 y.o. male with medical history significant of AAA, atrial fibrillation, coronary artery disease, C KD, GERD, renal cell carcinoma status post nephrectomy, hypertension, presenting with two-week history of confusion.   Pt states that his appetite has been good and he has been eating 100% of most meals. He reports sleeping through some meals some times or eating less breakfast due to food being cold. He has been drinking Ensure Enlive. Weight dropped down to 216 lbs on 4/12 and is now back to 220 lbs.   Labs: elevated BUN, low calcium, low GFR, low hemoglobin  Diet Order:  Diet Heart Room service appropriate? Yes; Fluid consistency: Thin  Skin:  Reviewed, no issues  Last BM:  4/15  Height:   Ht Readings from Last 1 Encounters:  08/15/16 5' 11.5" (1.816 m)    Weight:   Wt Readings from Last 1 Encounters:  08/21/16 220 lb 9.6 oz (100.1 kg)    Ideal Body Weight:  79.5 kg  BMI:  Body mass index is 30.34 kg/m.  Estimated Nutritional Needs:   Kcal:  1800-2000  Protein:  85-100 grams  Fluid:  1.8-2.0 L  EDUCATION NEEDS:   Education needs no appropriate at this time  Scarlette Ar RD, LDN, CSP Inpatient Clinical Dietitian Pager: [phone] After Hours Pager: [phone]

## 2016-08-21 NOTE — Consult Note (Signed)
[place] [facility] Consult   Reason for Consult:   Capacity Referring Physician:  Dr. Posey Pronto Patient Identification: Mark [name] MRN:  [id number] Principal Diagnosis: <principal problem not specified> Diagnosis:   Patient Active Problem List   Diagnosis Date Noted  . Alzheimer's dementia with behavioral disturbance [G30.9, F02.81]   . Hallucinations [R44.3]   . LBBB (left bundle branch block) [I44.7]   . Acute systolic CHF (congestive heart failure) ([place]) [I50.21] 08/16/2016  . Acute encephalopathy [G93.40] 08/14/2016  . Acute on chronic respiratory failure with hypoxia ([place]) [J96.21] 08/14/2016  . Asthma [J45.909] 03/24/2016  . Preventative health care [Z00.00] 09/22/2015  . Osteopenia [M85.80] 09/07/2014  . Osteoarthritis, hand [M19.049] 04/09/2014  . Normocytic anemia [D64.9] 04/09/2014  . OSA (obstructive sleep apnea) [G47.33] 01/08/2014  . Daytime somnolence [R40.0] 11/20/2013  . Renal cell carcinoma ([place]) [C64.9] 11/06/2013  . AAA (abdominal aortic aneurysm) without rupture ([place]) [I71.4] 09/22/2013  . Atrial fibrillation, chronic ([place]) [I48.2] 07/25/2012  . ED (erectile dysfunction) of organic origin [N52.9] 05/11/2011  . BPH (benign prostatic hyperplasia) [N40.0] 12/21/2009  . Abdominal aortic aneurysm ([place]) [I71.4] 05/28/2009  . Disorder resulting from impaired renal function [N25.9] 05/28/2009  . Hyperlipidemia [E78.5] 11/11/2008  . Depression [F32.9] 11/11/2008  . Essential hypertension [I10] 11/11/2008  . Coronary atherosclerosis [I25.10] 11/11/2008  . GERD [K21.9] 11/11/2008  . SKIN CANCER, HX OF [K74.259] 11/11/2008  . NEPHROLITHIASIS, HX OF [Z87.442] 11/11/2008    Total Time spent with patient: 1 hour  Subjective:   Mark [name] is a 76 y.o. male patient admitted with confusion.  HPI:  Mark [name] is a 76 y.o. male with medical history significant of AAA, atrial fibrillation, coronary artery disease, C KD, GERD, renal cell carcinoma status post  nephrectomy, hypertension, presenting with two-week history of confusion. Patient is seen, chart reviewed and case discussed with staff RN and tried hospitalist. Patient reported he came to the hospital because of shortness of breath, cough and questionable stroke symptoms. Patient reported he lives by himself in a powered floor condo for the last 14 years. Patient also reportedly takes bus rides for shopping. Patient reportedly has a son in [place] and also his ex-husband who is a Designer, jewellery support to him. He is hoping they are coming in next couple of days to help him to organize his condo. Patient reported he has been taking his medication including warfarin as required. Patient has understanding about his atrial fibrillation, abdominal aortic aneurysm and chronic kidney disease and reflux disease. Patient also able to name his cardiologist and primary care physician and his inability to go and receive services as needed. Patient has   know history of psychiatric illness, inpatient or outpatient hospitalizations. Patient has intact cognitions including orientation, memory, concentration and fund of knowledge. Staff RN reported patient seems to be a hoarder at home. He should also complaining about vivid dreams which is concerned about them but not scared of them.  Past Psychiatric History: Patient has no history of acute psychiatric hospitalization or outpatient medication management.  Risk to Self: Is patient at risk for suicide?: No Risk to Others:   Prior Inpatient Therapy:   Prior Outpatient Therapy:    Past Medical History:  Past Medical History:  Diagnosis Date  . AAA (abdominal aortic aneurysm) (HCC)    9.1 cm repair 05/2009 ( ENDOVASCULAR REPAIR ).  MORE RECENT RE-EVALUATION OF ENLARGING AAA BY DR. Trula Slade AND BY DR. Kathlene Cote  MAY 2015 - PT STATES HE UNDERSTANDS THAT NOTHING  IS LEAKING AT PRESENT TIME AND HE IS TO FOLLOW UP WITH HIS DOCTORS FOR FUTURE MONITORING I  . Arthritis     LEFT HAND, RT KNEE (08/14/2016)  . Basal cell carcinoma    "right side of my nose; think they burned it off" (08/14/2016)  . CAD (coronary artery disease)    a. s/p PCI to LAD/diagonal, subtotal of PDA 2006. b. Negative nuc 2014.  Marland Kitchen CHF (congestive heart failure) ([place]) 08/14/2016  . Chronic atrial fibrillation ([place])   . CKD (chronic kidney disease), stage III    a. stage III-IV, Cr baseline appears 1.8-2.0  . COPD (chronic obstructive pulmonary disease) ([place])   . Depression   . GERD (gastroesophageal reflux disease)   . History of kidney stones   . History of pneumothorax    LONG TIME AGO  . Hyperlipidemia   . Hypertension   . Migraine    "very rare the last 20 years" (08/14/2016)  . Myocardial infarction ([place]) 2006  . Osteoarthritis   . Renal cell carcinoma ([place])   . Shingles outbreak    PT NOTICED SKIN RASH RT GROIN AND RIGHT TRUNK ON MONDAY 10/27/13 - NOT A LOT OF DISCOMFORT- NOT ON ANY ORAL MEDS TO TREAT.  . Sleep apnea    "dx'd; didn't try mask" (08/14/2016)  . Stroke [facility])    "CT showed I've had old strokes; never knew about it before today" (08/14/2016)    Past Surgical History:  Procedure Laterality Date  . ABDOMINAL AORTAGRAM N/A 09/24/2013   Procedure: ABDOMINAL Maxcine Ham;  Surgeon: Serafina Mitchell, MD;  Location: [facility] CATH LAB;  Service: Cardiovascular;  Laterality: N/A;  . ABDOMINAL AORTIC ANEURYSM REPAIR  JAN 2011   ENDOVASCULAR - DR. BRABHAM  . CARDIAC CATHETERIZATION  07/04/2004   Archie Endo 07/04/2004  . COLONOSCOPY  early 1990s   /notes 11/11/2008  . CORONARY ANGIOPLASTY WITH STENT PLACEMENT  07/05/2004   notes 07/05/2004  . KNEE ARTHROSCOPY Right   . LITHOTRIPSY    . ROBOT ASSISTED LAPAROSCOPIC NEPHRECTOMY Left 11/06/2013   Procedure: ROBOTIC ASSISTED LAPAROSCOPIC RADICAL  NEPHRECTOMY;  Surgeon: Alexis Frock, MD;  Location: [facility] ORS;  Service: Urology;  Laterality: Left;  . TONSILLECTOMY  1964   Family History:  Family History  Problem Relation Age of Onset  . Pulmonary  embolism Mother     died of PE  . Heart disease Mother     before age 96  . Anuerysm Father     history of popliteal  . Coronary artery disease    . Hyperlipidemia    . Hypertension    . Prostate cancer    . Other Neg Hx     polycystic kidney disease, and colon cancer  . Colon cancer Neg Hx    Family Psychiatric  History: Patient reported his sister has unknown mental illness in the past. Social History:  History  Alcohol Use  . 1.5 oz/week  . 3 Standard drinks or equivalent per week    Comment: 08/14/2016 "maybe 2-3 drinks/month average"     History  Drug Use No    Social History   Social History  . Marital status: Divorced    Spouse name: N/A  . Number of children: 2  . Years of education: N/A   Occupational History  .  Retired   Social History Main Topics  . Smoking status: Former Smoker    Packs/day: 1.50    Years: 23.00    Types: Cigarettes    Quit date: 08/24/1980  .  Smokeless tobacco: Never Used  . Alcohol use 1.5 oz/week    3 Standard drinks or equivalent per week     Comment: 08/14/2016 "maybe 2-3 drinks/month average"  . Drug use: No  . Sexual activity: Not Asked   Other Topics Concern  . None   Social History Narrative   Retired   Divorced   2 sons 51 &30     Occupation:  Part time sports broadcaster    Smoking Status:  quit (08/24/1980)   Packs/Day:  1.0    Caffeine use/day:  3 beverages daily    Does Patient Exercise:  no   Alcohol Use - yes   Additional Social History:    Allergies:   Allergies  Allergen Reactions  . Ace Inhibitors     REACTION: Cough    Labs:  Results for orders placed or performed during the hospital encounter of 08/14/16 (from the past 48 hour(s))  Protime-INR     Status: Abnormal   Collection Time: 08/20/16  5:31 AM  Result Value Ref Range   Prothrombin Time 19.7 (H) 11.4 - 15.2 seconds   INR 9.32   Basic metabolic panel     Status: Abnormal   Collection Time: 08/20/16  5:31 AM  Result Value Ref Range    Sodium 142 135 - 145 mmol/L   Potassium 3.4 (L) 3.5 - 5.1 mmol/L   Chloride 106 101 - 111 mmol/L   CO2 27 22 - 32 mmol/L   Glucose, Bld 109 (H) 65 - 99 mg/dL   BUN 36 (H) 6 - 20 mg/dL   Creatinine, Ser 1.93 (H) 0.61 - 1.24 mg/dL   Calcium 8.6 (L) 8.9 - 10.3 mg/dL   GFR calc non Af Amer 33 (L) >60 mL/min   GFR calc Af Amer 38 (L) >60 mL/min    Comment: (NOTE) The eGFR has been calculated using the CKD EPI equation. This calculation has not been validated in all clinical situations. eGFR's persistently <60 mL/min signify possible Chronic Kidney Disease.    Anion gap 9 5 - 15  CBC     Status: Abnormal   Collection Time: 08/20/16  5:31 AM  Result Value Ref Range   WBC 5.4 4.0 - 10.5 K/uL   RBC 3.71 (L) 4.22 - 5.81 MIL/uL   Hemoglobin 11.1 (L) 13.0 - 17.0 g/dL   HCT 34.9 (L) 39.0 - 52.0 %   MCV 94.1 78.0 - 100.0 fL   MCH 29.9 26.0 - 34.0 pg   MCHC 31.8 30.0 - 36.0 g/dL   RDW 16.1 (H) 11.5 - 15.5 %   Platelets 151 150 - 400 K/uL  Protime-INR     Status: Abnormal   Collection Time: 08/21/16  3:51 AM  Result Value Ref Range   Prothrombin Time 19.2 (H) 11.4 - 15.2 seconds   INR 3.55   Basic metabolic panel     Status: Abnormal   Collection Time: 08/21/16  3:51 AM  Result Value Ref Range   Sodium 140 135 - 145 mmol/L   Potassium 3.5 3.5 - 5.1 mmol/L   Chloride 107 101 - 111 mmol/L   CO2 28 22 - 32 mmol/L   Glucose, Bld 131 (H) 65 - 99 mg/dL   BUN 27 (H) 6 - 20 mg/dL   Creatinine, Ser 1.78 (H) 0.61 - 1.24 mg/dL   Calcium 8.4 (L) 8.9 - 10.3 mg/dL   GFR calc non Af Amer 36 (L) >60 mL/min   GFR calc Af Amer 42 (L) >60  mL/min    Comment: (NOTE) The eGFR has been calculated using the CKD EPI equation. This calculation has not been validated in all clinical situations. eGFR's persistently <60 mL/min signify possible Chronic Kidney Disease.    Anion gap 5 5 - 15    Current Facility-Administered Medications  Medication Dose Route Frequency Provider Last Rate Last Dose  . 0.9 %   sodium chloride infusion  250 mL Intravenous PRN Waldemar Dickens, MD      . acetaminophen (TYLENOL) tablet 650 mg  650 mg Oral Q6H PRN Waldemar Dickens, MD       Or  . acetaminophen (TYLENOL) suppository 650 mg  650 mg Rectal Q6H PRN Waldemar Dickens, MD      . atorvastatin (LIPITOR) tablet 40 mg  40 mg Oral q1800 Waldemar Dickens, MD   40 mg at 08/20/16 1842  . carvedilol (COREG) tablet 25 mg  25 mg Oral BID WC Josue Hector, MD   25 mg at 08/21/16 0647  . famotidine (PEPCID) tablet 20 mg  20 mg Oral Daily Waldemar Dickens, MD   20 mg at 08/21/16 1024  . [START ON 08/22/2016] feeding supplement (ENSURE ENLIVE) (ENSURE ENLIVE) liquid 237 mL  237 mL Oral Q24H Lavina Hamman, MD      . furosemide (LASIX) tablet 60 mg  60 mg Oral BID Josue Hector, MD   60 mg at 08/21/16 0827  . isosorbide dinitrate (ISORDIL) tablet 20 mg  20 mg Oral TID Waldemar Dickens, MD   20 mg at 08/21/16 1024  . losartan (COZAAR) tablet 50 mg  50 mg Oral QPM Waldemar Dickens, MD   50 mg at 08/20/16 1842  . ondansetron (ZOFRAN) tablet 4 mg  4 mg Oral Q6H PRN Waldemar Dickens, MD       Or  . ondansetron [facility]) injection 4 mg  4 mg Intravenous Q6H PRN Waldemar Dickens, MD      . potassium chloride SA (K-DUR,KLOR-CON) CR tablet 20 mEq  20 mEq Oral QPM Dayna N Dunn, PA-C   20 mEq at 08/20/16 1842  . sertraline (ZOLOFT) tablet 100 mg  100 mg Oral QPM Waldemar Dickens, MD   100 mg at 08/20/16 1842  . sodium chloride flush (NS) 0.9 % injection 3 mL  3 mL Intravenous Q12H Waldemar Dickens, MD   3 mL at 08/21/16 1025  . sodium chloride flush (NS) 0.9 % injection 3 mL  3 mL Intravenous PRN Waldemar Dickens, MD      . tamsulosin [facility]) capsule 0.4 mg  0.4 mg Oral Daily Waldemar Dickens, MD   0.4 mg at 08/21/16 1024  . traZODone (DESYREL) tablet 50 mg  50 mg Oral QHS Geradine Girt, DO   50 mg at 08/20/16 2312  . warfarin (COUMADIN) tablet 7.5 mg  7.5 mg Oral ONCE-1800 Tyrone Apple, RPH      . Warfarin - Pharmacist Dosing Inpatient   Does not  apply q1800 Waldemar Dickens, MD        Musculoskeletal: Strength & Muscle Tone: within normal limits Gait & Station: unable to stand Patient leans: N/A  Psychiatric Specialty Exam: Physical Exam as per history and physical   ROS patient has vivid dreams but denied nausea, vomiting, abdominal pain, shortness of breath and chest pain No Fever-chills, No Headache, No changes with Vision or hearing, reports vertigo No problems swallowing food or Liquids, No Chest pain, Cough or  Shortness of Breath, No Abdominal pain, No Nausea or Vommitting, Bowel movements are regular, No Blood in stool or Urine, No dysuria, No new skin rashes or bruises, No new joints pains-aches,  No new weakness, tingling, numbness in any extremity, No recent weight gain or loss, No polyuria, polydypsia or polyphagia,   A full 10 point Review of Systems was done, except as stated above, all other Review of Systems were negative.  Blood pressure (!) 140/95, pulse 78, temperature 98.2 F (36.8 C), temperature source Oral, resp. rate 20, height 5' 11.5" (1.816 m), weight 100.1 kg (220 lb 9.6 oz), SpO2 94 %.Body mass index is 30.34 kg/m.  General Appearance: Casual  Eye Contact:  Good  Speech:  Clear and Coherent  Volume:  Normal  Mood:  Euthymic  Affect:  Appropriate and Congruent  Thought Process:  Coherent and Goal Directed  Orientation:  Full (Time, Place, and Person)  Thought Content:  WDL  Suicidal Thoughts:  No  Homicidal Thoughts:  No  Memory:  Immediate;   Good Recent;   Fair Remote;   Fair  Judgement:  Fair  Insight:  Fair  Psychomotor Activity:  Decreased  Concentration:  Concentration: Good and Attention Span: Good  Recall:  Good  Fund of Knowledge:  Good  Language:  Good  Akathisia:  Negative  Handed:  Right  AIMS (if indicated):     Assets:  Communication Skills Desire for Improvement Financial Resources/Insurance Housing Leisure Time Resilience  ADL's:  Intact  Cognition:   Impaired,  Mild  Sleep:        Treatment Plan Summary: 76 years old male presented with questionable strokelike activity secondary to shortness of breath and coughing. Patient also reported his primary care physician recommended admission to the hospital for the competency evaluation.  Based on my evaluation patient meets criteria for capacity to make his own medical decisions and living arrangements Referred to the unit social service regarding psychosocial issues like needed help for organizing his condo and communication with his family members and possibly obtaining collateral information. Appreciate psychiatric consultation and we sign off as of today Please contact [phone] or [phone] if needs further assistance   Disposition: No evidence of imminent risk to self or others at present.   Supportive therapy provided about ongoing stressors.  Ambrose Finland, MD 08/21/2016 3:42 PM

## 2016-08-21 NOTE — Progress Notes (Signed)
ANTICOAGULATION CONSULT NOTE - Follow Up Consult  Pharmacy Consult:  Coumadin Indication: atrial fibrillation  Allergies  Allergen Reactions  . Ace Inhibitors     REACTION: Cough    Patient Measurements: Height: 5' 11.5" (181.6 cm) Weight: 220 lb 9.6 oz (100.1 kg) IBW/kg (Calculated) : 76.45  Vital Signs: Temp: 97.5 F (36.4 C) (04/16 0530) Temp Source: Tympanic (04/16 0530) BP: 121/62 (04/16 0530) Pulse Rate: 84 (04/16 0530)  Labs:  Recent Labs  08/19/16 0319 08/20/16 0531 08/21/16 0351  HGB  --  11.1*  --   HCT  --  34.9*  --   PLT  --  151  --   LABPROT 21.2* 19.7* 19.2*  INR 1.80 1.65 1.60  CREATININE 1.83* 1.93* 1.78*    Estimated Creatinine Clearance: 44.9 mL/min (A) (by C-G formula based on SCr of 1.78 mg/dL (H)).     Assessment: 54 YOM on Coumadin PTA for history of AFib.  Coumadin was held for several days for possible LP, now resumed.  INR has been sub-therapeutic since resumption; no bleeding reported.   Goal of Therapy:  INR 2-3    Plan:  - Repeat Coumadin 7.5mg  PO today - Daily PT / INR   Kennidi Yoshida D. Mina Marble, PharmD, BCPS Pager:  [phone] 08/21/2016, 8:23 AM

## 2016-08-21 NOTE — Care Management Note (Signed)
Case Management Note  Patient Details  Name: Mark [name] MRN: [id number] Date of Birth: 1941-03-26  Subjective/Objective:   Admitted with Acute Encephalopathy                Action/Plan: Pt gave CM permission to talk to [name]; CM informed Mark [name] that patient is independent of all of his ADL and HHC cannot be arranged due to patient is not homebound.Rose stated that she is out of town at this time but his son will be in [place] tomorrow to assist in his care at home; she will also contact his sister for additional help.  Expected Discharge Date:    possibly 08/22/2016              Expected Discharge Plan:   Home  In-House Referral:   SW  Discharge planning Services  CM Consult  Status of Service:  In process, will continue to follow  Sherrilyn Rist [phone] 08/21/2016, 2:30 PM

## 2016-08-22 LAB — BASIC METABOLIC PANEL
ANION GAP: 9 (ref 5–15)
BUN: 27 mg/dL — AB (ref 6–20)
CHLORIDE: 105 mmol/L (ref 101–111)
CO2: 27 mmol/L (ref 22–32)
Calcium: 8.7 mg/dL — ABNORMAL LOW (ref 8.9–10.3)
Creatinine, Ser: 1.77 mg/dL — ABNORMAL HIGH (ref 0.61–1.24)
GFR calc Af Amer: 42 mL/min — ABNORMAL LOW (ref >60)
GFR, EST NON AFRICAN AMERICAN: 36 mL/min — AB (ref >60)
GLUCOSE: 117 mg/dL — AB (ref 65–99)
POTASSIUM: 3.9 mmol/L (ref 3.5–5.1)
Sodium: 141 mmol/L (ref 135–145)

## 2016-08-22 LAB — PROTIME-INR
INR: 1.86
Prothrombin Time: 21.7 seconds — ABNORMAL HIGH (ref 11.4–15.2)

## 2016-08-22 MED ORDER — CARVEDILOL 25 MG PO TABS: 25.0000 mg | ORAL_TABLET | Freq: Two times a day (BID) | ORAL | 0 refills | Status: DC

## 2016-08-22 MED ORDER — TRAZODONE HCL 50 MG PO TABS: 50.0000 mg | ORAL_TABLET | Freq: Every day | ORAL | 0 refills | Status: DC

## 2016-08-22 MED ORDER — WARFARIN SODIUM 7.5 MG PO TABS: 7.5000 mg | ORAL_TABLET | Freq: Once | ORAL | Status: DC

## 2016-08-22 MED ORDER — FUROSEMIDE 20 MG PO TABS: 60.0000 mg | ORAL_TABLET | Freq: Two times a day (BID) | ORAL | 0 refills | Status: DC

## 2016-08-22 MED ORDER — POTASSIUM CHLORIDE CRYS ER 20 MEQ PO TBCR: 20.0000 meq | EXTENDED_RELEASE_TABLET | Freq: Every evening | ORAL | 0 refills | Status: DC

## 2016-08-22 MED ORDER — ENSURE ENLIVE PO LIQD: 237.0000 mL | ORAL | 12 refills | Status: DC

## 2016-08-22 NOTE — Progress Notes (Signed)
Oxygen ordered for home through [place] as requested and to be delivered to the room prior to discharging home today. Mindi Slicker [facility] [phone]

## 2016-08-22 NOTE — Progress Notes (Signed)
Pt refused to have bed alarm turned on.  Insrtucted to call for assistance.  Call bell at reach.  Verbalized understanding.  Will continue to monitor.  Karie Kirks, Therapist, sports.

## 2016-08-22 NOTE — Progress Notes (Signed)
SATURATION QUALIFICATIONS: (This note is used to comply with regulatory documentation for home oxygen)  Patient Saturations on Room Air at Rest = 88%  Patient Saturations on Room Air while Ambulating = 79-85%   Patient Saturations on 3  Liters of oxygen while Ambulating = 94%  Please briefly explain why patient needs home oxygen:

## 2016-08-22 NOTE — Progress Notes (Signed)
O2 received around 1320 for home use as ordered.  All d/c instructions explained and give to pt and his son at bedside. Verbalized understanding.  D/c off floor at 1352 to awaiting transport.  Karie Kirks, Therapist, sports.

## 2016-08-22 NOTE — Care Management Important Message (Signed)
Important Message  Patient Details  Name: Mark [name] MRN: [id number] Date of Birth: 09/16/1940   Medicare Important Message Given:  Yes    Orbie Pyo 08/22/2016, 2:12 PM

## 2016-08-22 NOTE — Progress Notes (Signed)
CM talked to patient about [facility], patient refused; His son is in  to assist in his care and also his sister will help him also; Patient needs home oxygen, awaiting for oxygen saturation documentation to order home oxygen; Aneta Mins [phone]

## 2016-08-22 NOTE — Progress Notes (Signed)
ANTICOAGULATION CONSULT NOTE - Follow Up Consult  Pharmacy Consult:  Coumadin Indication: atrial fibrillation  Allergies  Allergen Reactions  . Ace Inhibitors     REACTION: Cough    Patient Measurements: Height: 5' 11.5" (181.6 cm) Weight: 221 lb 11.2 oz (100.6 kg) IBW/kg (Calculated) : 76.45  Vital Signs: Temp: 98.2 F (36.8 C) (04/17 0500) Temp Source: Oral (04/17 0500) BP: 133/90 (04/17 0745) Pulse Rate: 74 (04/17 0745)  Labs:  Recent Labs  08/20/16 0531 08/21/16 0351 08/22/16 0359  HGB 11.1*  --   --   HCT 34.9*  --   --   PLT 151  --   --   LABPROT 19.7* 19.2* 21.7*  INR 1.65 1.60 1.86  CREATININE 1.93* 1.78* 1.77*    Estimated Creatinine Clearance: 45.3 mL/min (A) (by C-G formula based on SCr of 1.77 mg/dL (H)).     Assessment: 89 YOM on Coumadin PTA for history of AFib.  Coumadin was held for several days for possible LP, now resumed.  INR trending up toward goal; no bleeding reported.   Goal of Therapy:  INR 2-3    Plan:  - Repeat Coumadin 7.5mg  PO today - Daily PT / INR - If discharging today, recommend giving ordered dose prior to discharge, then resume home regimen tomorrow.  INR check by Friday 08/25/16.   Ryelan Kazee D. Mina Marble, PharmD, BCPS Pager:  [phone] 08/22/2016, 8:08 AM

## 2016-08-23 ENCOUNTER — Telehealth: Payer: Self-pay

## 2016-08-23 ENCOUNTER — Telehealth: Payer: Self-pay | Admitting: *Deleted

## 2016-08-23 DIAGNOSIS — J9621 Acute and chronic respiratory failure with hypoxia: Secondary | ICD-10-CM | POA: Diagnosis not present

## 2016-08-23 NOTE — Telephone Encounter (Signed)
Received fax from [facility] requiring prior authorization for Isosorbide dinitrate 20mg .  Form initiated and forwarded to PCP for completion and signature. Please see Questions 3 and 4 and sign. Forms placed in PCP red folder.

## 2016-08-23 NOTE — Telephone Encounter (Signed)
08/23/16  Hospital follow up.  Transition Care Management Follow-up Telephone Call  ADMISSION DATE: 08/14/16  DISCHARGE DATE: 08/22/16    How have you been since you were released from the hospital? Patient states he slept well last night. States he has SOB this am upon excertion. States O2 is being delivered today.     Do you understand why you were in the hospital? YES   Do you understand the discharge instrcutions? Yes Items Reviewed:  Medications reviewed:  Medications reviewed with patient   Allergies reviewed: Reviewed with patient  Dietary changes reviewed:Low Sodium Heart Healthy  Referrals reviewed: Appointment scheduled with PCP  Functional Questionnaire:   Activities of Daily Living (ADLs):  Patient has a car but is in the shop states he rides the Bus   Any transportation issues/concerns?: Rides the Bus    Any patient concerns? O2 status and Potassium   Confirmed importance and date/time of follow-up visits scheduled: Yes   Confirmed with patient if condition begins to worsen call PCP or go to the ER. Yes   Patient was given the [place] line [phone]: Yes

## 2016-08-24 NOTE — Telephone Encounter (Signed)
Noted  

## 2016-08-24 NOTE — Discharge Summary (Signed)
[facility] Discharge Summary   Patient: Mark [name] YQM:[id number]   PCP: Mark [name]., NP DOB: 1940-08-05   Date of admission: 08/14/2016   Date of discharge: 08/22/2016    Discharge Diagnoses:  Active Problems:   BPH (benign prostatic hyperplasia)   Atrial fibrillation, chronic (HCC)   AAA (abdominal aortic aneurysm) without rupture (HCC)   Renal cell carcinoma (HCC)   OSA (obstructive sleep apnea)   Acute encephalopathy   Acute on chronic respiratory failure with hypoxia (HCC)   Acute systolic CHF (congestive heart failure) (HCC)   Alzheimer's dementia with behavioral disturbance   Hallucinations   LBBB (left bundle branch block)   Admitted From: home Disposition:  home  Recommendations for Outpatient Follow-up:  1. Please follow up with PCP, cardiology, psychiatry    Follow-up Information    Mark [name]., NP. Go on 08/28/2016.   Specialty:  Internal Medicine Why:  @2 :30pm Contact information: [place] [address] [phone]          Diet recommendation: cardiac diet  Activity: The patient is advised to gradually reintroduce usual activities.  Discharge Condition: good  Code Status: full code  History of present illness: As per the H and P dictated on admission, "Mark [name] is a 76 y.o. male with medical history significant of AAA, atrial fibrillation, coronary artery disease, C KD, GERD, renal cell carcinoma status post nephrectomy, hypertension, presenting with two-week history of confusion. Level V caveat applies his history is limited due to patient's mental status and is obtained only in part by family members. Patient endorses "weird dreams " with reoccurring vivid dreams but always tired to reality such as curtains in his house or an every dreams about her where he is. Patient states that he realizes that these are unreal experiences. Patient's ex wife endorses taking 2 some of his family members  who state that he is having bizarre phone calls with them starting as far back as 2 weeks. One day ago patient's son, who lives with him,  called patient's ex wife due to concerns of her confusion where patient thought he was at his wife's house when all reality he was at his own house. Patient denies any dysuria, frequency, chest pain, palpitations, nausea, vomiting, coughing, fevers, neck stiffness, headache, focal neurological deficit. Patient does endorse increasing dyspnea on exertion over the last 3 months but denies lower extremity swelling."  Hospital Course:  Summary of his active problems in the hospital is as following. Acute encephalopathy -predominantly hallucinations and vivid dreams -Hypoxemia vs intracranial process not yet visualized- MRI brain negative -CT head w/o acute process -UDS negative- ? Alcohol use -h/o renal cell cancer No significant metabolic derangements. Question psych etiology. No sedating medications.  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 Discussed with neurology recommends no further workup at present. -Case discussed with psychiatry, has mental capacity, no change in meds.  Acute respiratory failure/LBBB/Acute on chronic systolic CHF, moderate MR -Previous echo w/ nml EF and evidence of diastolic chf. -CXR w/o evidence of infectious process. BNP 84. Troponin.01. Lasix 60 IV x1 in ED at [facility]. ABG pH 7.4, PCO2 29, P O2 64, CO2 21.  -Cardiology consulted and appreciated,  was on Lasix 40 IV BID, transitioned to oral Lasix. -Cardiology Recommended further workup for tickborne illnesses although Borrelia titers negative.  -Cardiology would  want to consider cath once mental status has been sorted out, likely as an outpatient -ID consulted and appreciated recommended discontinuing antibiotics. -Changedmetoprolol to Coreg.  Chronic kidney disease, stage III -Baseline creatinine approximately 1.8-1.9, presented with creatinine of 2.05 -Creatinine currently 1.78, continue to monitor BMP outpatient.  Essential hypertension -resumed losartan, Coreg, isosorbide -Norvasc discontinued  BPH -continue flomax  GERD -continue pepcid  Depression -continue zoloft- no change in dose per patient  Hyperlipidemia -continue lipitor  Atrial fibrillation -continue bblocker, coumadin (INR 1.65 subtherapeutic- spoke to pharmacy, increased dose today) -it appears from review of chart that patient was not compliant with getting INR checked as directed  AAA -f/u outpt. Pt due for his yearly appt and imaging.   Social issues -Earlier provider Spoke with Ex-wife/Friend. Patient has a complex social/home situation.  -Social work/Case management consulted, maximum support provided.  All other chronic medical condition were stable during the hospitalization.  Patient was ambulatory without any assistance. On the day of the discharge the patient's vitals were stable, and no other acute medical condition were reported by patient. the patient was felt safe to be discharge at home with family.  Procedures and Results:  Echocardiogram    Consultations:  Cardiology   Psychiatry  DISCHARGE MEDICATION: Discharge Medication List as of 08/22/2016  1:36 PM    CONTINUE these medications which have CHANGED   Details  carvedilol (COREG) 25 MG tablet Take 1 tablet (25 mg total) by mouth 2 (two) times daily with a meal., Starting Tue 08/22/2016, Normal    feeding supplement, ENSURE ENLIVE, (ENSURE ENLIVE) LIQD Take 237 mLs by mouth daily., Starting Tue 08/22/2016, Normal    furosemide (LASIX) 20 MG tablet Take 3 tablets (60 mg total)  by mouth 2 (two) times daily., Starting Tue 08/22/2016, Normal    potassium chloride SA (K-DUR,KLOR-CON) 20 MEQ tablet Take 1 tablet (20 mEq total) by mouth every evening., Starting Tue 08/22/2016, Normal    traZODone (DESYREL) 50 MG tablet Take 1 tablet (50 mg total) by mouth at bedtime., Starting Tue 08/22/2016, Normal      CONTINUE these medications which have NOT CHANGED   Details  albuterol (PROVENTIL HFA;VENTOLIN HFA) 108 (90 Base) MCG/ACT inhaler Inhale 2 puffs into the lungs every 6 (six) hours as needed for wheezing or shortness of breath., Starting Wed 01/26/2016, Print    atorvastatin (LIPITOR) 40 MG tablet TAKE 1 TABLET ONE TIME DAILY, Normal    calcium elemental as carbonate (BARIATRIC TUMS ULTRA) 400 MG chewable tablet Starting Sun 04/11/2015, Historical Med    Coenzyme Q10 200 MG capsule Take 200 mg by mouth daily., Historical Med    isosorbide dinitrate (ISORDIL) 20 MG tablet Take 1 tablet (20 mg total) by mouth 3 (three) times daily., Starting Thu 04/06/2016, Normal    losartan (COZAAR) 50 MG tablet Take 1 tablet (50 mg total) by mouth every evening., Starting Fri 06/02/2016, Normal    Multiple Vitamins-Minerals (MACUVITE EYE CARE PO) Take 1 capsule by mouth daily., Starting Fri 05/21/2015, Historical Med    Omega-3 Fatty Acids (FISH OIL) 1200 MG CAPS Take 1,200 mg by mouth daily. , Historical Med    ranitidine (ZANTAC) 150 MG tablet Take 1 tablet (150 mg total) by mouth 2 (two) times daily., Starting Thu 04/06/2016, Normal    sertraline (ZOLOFT) 100 MG tablet Take 1 tablet (100 mg total) by  mouth every evening., Starting Thu 04/06/2016, Normal    tamsulosin (FLOMAX) 0.4 MG CAPS capsule TAKE 1 CAPSULE (0.4 MG TOTAL) DAILY AFTER BREAKFAST., Normal    warfarin (COUMADIN) 5 MG tablet TAKE 1 TABLET EVERY DAY EXCEPT 1.5 TABLETS ON WEDNESDAY., Normal      STOP taking these medications     amLODipine (NORVASC) 10 MG tablet      metoprolol (LOPRESSOR) 100 MG tablet         Allergies  Allergen Reactions  . Ace Inhibitors     REACTION: Cough   Discharge Instructions    Ambulatory referral to Psychiatry    Complete by:  As directed    Diet - low sodium heart healthy    Complete by:  As directed    Discharge instructions    Complete by:  As directed    It is important that you read following instructions as well as go over your medication list with RN to help you understand your care after this hospitalization.  Discharge Instructions: Please follow-up with PCP in one week  Please request your primary care physician to go over all Hospital Tests and Procedure/Radiological results at the follow up,  Please get all Hospital records sent to your PCP by signing hospital release before you go home.   Do not drive, operating heavy machinery, perform activities at heights, swimming or participation in water activities or provide baby sitting services; until you have been seen by Primary Care Physician or a Neurologist and advised to do so again. Do not take more than prescribed Pain, Sleep and Anxiety Medications. You were cared for by a hospitalist during your hospital stay. If you have any questions about your discharge medications or the care you received while you were in the hospital after you are discharged, you can call the unit and ask to speak with the hospitalist on call if the hospitalist that took care of you is not available.  Once you are discharged, your primary care physician will handle any further medical issues. Please note that NO REFILLS for any discharge medications will be authorized once you are discharged, as it is imperative that you return to your primary care physician (or establish a relationship with a primary care physician if you do not have one) for your aftercare needs so that they can reassess your need for medications and monitor your lab values. You Must read complete instructions/literature along with all the possible adverse  reactions/side effects for all the Medicines you take and that have been prescribed to you. Take any new Medicines after you have completely understood and accept all the possible adverse reactions/side effects. Wear Seat belts while driving. If you have smoked or chewed Tobacco in the last 2 yrs please stop smoking and/or stop any Recreational drug use.   Increase activity slowly    Complete by:  As directed      Discharge Exam: Filed Weights   08/20/16 0300 08/21/16 0530 08/22/16 0500  Weight: 99.8 kg (220 lb) 100.1 kg (220 lb 9.6 oz) 100.6 kg (221 lb 11.2 oz)   Vitals:   08/22/16 0745 08/22/16 1210  BP: 133/90 133/84  Pulse: 74 79  Resp:  18  Temp:  98 F (36.7 C)   General: Appear in no distress, no Rash; Oral Mucosa moist. Cardiovascular: S1 and S2 Present, no Murmur, no JVD Respiratory: Bilateral Air entry present and Clear to Auscultation, no Crackles, no wheezes Abdomen: Bowel Sound present, Soft and no tenderness Extremities: no  Pedal edema, no calf tenderness Neurology: Grossly no focal neuro deficit.  The results of significant diagnostics from this hospitalization (including imaging, microbiology, ancillary and laboratory) are listed below for reference.    Significant Diagnostic Studies: Dg Chest 2 View  Result Date: 08/14/2016 CLINICAL DATA:  Cough and chest congestion.  Altered mental status. EXAM: CHEST  2 VIEW COMPARISON:  01/05/2016 FINDINGS: The heart size normal. Pulmonary vascularity is at the upper limits of normal. The patient has new bilateral small pleural effusions. Extensive calcification in the tortuous thoracic aorta. No discrete pulmonary edema. No acute bone abnormality. IMPRESSION: New small bilateral pleural effusions. Pulmonary vascularity is at the upper limits of normal. Electronically Signed   By: Lorriane Shire M.D.   On: 08/14/2016 11:16   Ct Head Wo Contrast  Result Date: 08/14/2016 CLINICAL DATA:  76 year old hypertensive male altered mental  status. Initial encounter. EXAM: CT HEAD WITHOUT CONTRAST TECHNIQUE: Contiguous axial images were obtained from the base of the skull through the vertex without intravenous contrast. COMPARISON:  None. FINDINGS: Brain: No intracranial hemorrhage. Prominent chronic microvascular changes and remote bilateral basal ganglia infarcts without CT evidence of large acute infarct. Mild global atrophy without hydrocephalus. No intracranial mass lesion noted on this unenhanced exam. Vascular: Vascular calcifications. Skull: No acute abnormality. Sinuses/Orbits: No acute orbital abnormality. Visualized paranasal sinuses are clear. Other: Negative IMPRESSION: Prominent chronic microvascular changes and remote bilateral basal ganglia infarcts without CT evidence of large acute infarct. No intracranial hemorrhage. Mild global atrophy. Electronically Signed   By: Genia Del M.D.   On: 08/14/2016 11:27   Mr Brain Wo Contrast  Result Date: 08/15/2016 CLINICAL DATA:  Confusion. EXAM: MRI HEAD WITHOUT CONTRAST TECHNIQUE: Multiplanar, multiecho pulse sequences of the brain and surrounding structures were obtained without intravenous contrast. COMPARISON:  Head CT 08/14/2016 FINDINGS: Brain: There is no evidence of acute infarct, mass, midline shift, or extra-axial fluid collection. A few scattered chronic microhemorrhages are noted in the right parietal and posterior left temporal lobes. Patchy to confluent T2 hyperintensities in the cerebral white matter and brainstem are nonspecific but compatible with moderate to advanced chronic small vessel ischemic disease. Chronic lacunar infarcts are noted in the bilateral basal ganglia and right centrum semiovale. Heterogeneous T2 hyperintensity is also present in the thalami. There is mild generalized cerebral atrophy. Vascular: Major intracranial vascular flow voids are preserved. Skull and upper cervical spine: Unremarkable bone marrow signal. Sinuses/Orbits: Unremarkable orbits.  Small mucous retention cysts in the maxillary sinuses. Small left mastoid effusion. Other: None. IMPRESSION: 1. No acute intracranial abnormality. 2. Extensive chronic small vessel ischemic disease. Electronically Signed   By: Logan Bores M.D.   On: 08/15/2016 13:50   Dg Chest Port 1 View  Result Date: 08/15/2016 CLINICAL DATA:  Dyspnea at rest EXAM: PORTABLE CHEST 1 VIEW COMPARISON:  08/14/2016 FINDINGS: Stable cardiomegaly with aortic atherosclerosis. Mild interstitial pulmonary edema with layering bilateral effusions right slightly greater than left. No acute osseous abnormality. IMPRESSION: Interstitial pulmonary edema with layering bilateral effusions right greater than left. Stable cardiomegaly with aortic atherosclerosis. Electronically Signed   By: Ashley Royalty M.D.   On: 08/15/2016 02:40    Microbiology: Recent Results (from the past 240 hour(s))  MRSA PCR Screening     Status: None   Collection Time: 08/15/16  3:21 AM  Result Value Ref Range Status   MRSA by PCR NEGATIVE NEGATIVE Final    Comment:        The GeneXpert MRSA Assay (FDA approved for NASAL  specimens only), is one component of a comprehensive MRSA colonization surveillance program. It is not intended to diagnose MRSA infection nor to guide or monitor treatment for MRSA infections.      Labs: CBC:  Recent Labs Lab 08/18/16 0354 08/20/16 0531  WBC 5.4 5.4  NEUTROABS 3.7  --   HGB 11.1* 11.1*  HCT 34.9* 34.9*  MCV 95.1 94.1  PLT 143* 115   Basic Metabolic Panel:  Recent Labs Lab 08/18/16 0354 08/19/16 0319 08/20/16 0531 08/21/16 0351 08/22/16 0359  NA 141 141 142 140 141  K 3.2* 3.3* 3.4* 3.5 3.9  CL 106 103 106 107 105  CO2 26 28 27 28 27   GLUCOSE 107* 118* 109* 131* 117*  BUN 27* 30* 36* 27* 27*  CREATININE 1.83* 1.83* 1.93* 1.78* 1.77*  CALCIUM 8.5* 8.6* 8.6* 8.4* 8.7*   Liver Function Tests: No results for input(s): AST, ALT, ALKPHOS, BILITOT, PROT, ALBUMIN in the last 168 hours. No  results for input(s): LIPASE, AMYLASE in the last 168 hours. No results for input(s): AMMONIA in the last 168 hours. Cardiac Enzymes: No results for input(s): CKTOTAL, CKMB, CKMBINDEX, TROPONINI in the last 168 hours. BNP (last 3 results)  Recent Labs  08/14/16 1047  BNP 864.3*   CBG:  Recent Labs Lab 08/18/16 0734 08/18/16 1147  GLUCAP 97 101*   Time spent: 30 minutes  Signed:  PATEL, PRANAV  [facility] 08/22/2016, 1:41 PM

## 2016-08-28 ENCOUNTER — Inpatient Hospital Stay: Payer: Medicare HMO | Admitting: Family

## 2016-08-28 ENCOUNTER — Telehealth: Payer: Self-pay | Admitting: Family

## 2016-08-28 NOTE — Telephone Encounter (Signed)
Contacted patient to check on him since he missed his appointment. Pt reports that he forgot.  Shiquita, could you please contact pt and reschedule hospital follow up visit?  No charge for today's no-show.

## 2016-08-30 NOTE — Telephone Encounter (Signed)
lvm for pt to call back to schedule hospital F/U

## 2016-09-01 ENCOUNTER — Encounter: Payer: Self-pay | Admitting: Family

## 2016-09-01 ENCOUNTER — Ambulatory Visit (INDEPENDENT_AMBULATORY_CARE_PROVIDER_SITE_OTHER): Payer: Medicare HMO | Admitting: Family

## 2016-09-01 ENCOUNTER — Ambulatory Visit (HOSPITAL_BASED_OUTPATIENT_CLINIC_OR_DEPARTMENT_OTHER)
Admission: RE | Admit: 2016-09-01 | Discharge: 2016-09-01 | Disposition: A | Discharge status: Home / Self Care | Payer: Medicare HMO | Source: Ambulatory Visit | Attending: Family | Admitting: Family

## 2016-09-01 VITALS — BP 102/58 | HR 56 | Temp 97.6°F | Resp 18 | Ht 71.0 in | Wt 219.0 lb

## 2016-09-01 DIAGNOSIS — F0391 Unspecified dementia with behavioral disturbance: Secondary | ICD-10-CM

## 2016-09-01 DIAGNOSIS — R05 Cough: Secondary | ICD-10-CM | POA: Diagnosis not present

## 2016-09-01 DIAGNOSIS — I517 Cardiomegaly: Secondary | ICD-10-CM | POA: Diagnosis not present

## 2016-09-01 DIAGNOSIS — J90 Pleural effusion, not elsewhere classified: Secondary | ICD-10-CM | POA: Insufficient documentation

## 2016-09-01 DIAGNOSIS — I509 Heart failure, unspecified: Secondary | ICD-10-CM

## 2016-09-01 DIAGNOSIS — R059 Cough, unspecified: Secondary | ICD-10-CM

## 2016-09-01 DIAGNOSIS — I482 Chronic atrial fibrillation, unspecified: Secondary | ICD-10-CM

## 2016-09-01 DIAGNOSIS — N289 Disorder of kidney and ureter, unspecified: Secondary | ICD-10-CM

## 2016-09-01 DIAGNOSIS — I4891 Unspecified atrial fibrillation: Secondary | ICD-10-CM

## 2016-09-01 DIAGNOSIS — R0602 Shortness of breath: Secondary | ICD-10-CM | POA: Diagnosis not present

## 2016-09-01 LAB — BASIC METABOLIC PANEL
BUN: 30 mg/dL — ABNORMAL HIGH (ref 7–25)
CHLORIDE: 103 mmol/L (ref 98–110)
CO2: 24 mmol/L (ref 20–31)
CREATININE: 2.33 mg/dL — AB (ref 0.70–1.18)
Calcium: 8.4 mg/dL — ABNORMAL LOW (ref 8.6–10.3)
Glucose, Bld: 85 mg/dL (ref 65–99)
Potassium: 4.1 mmol/L (ref 3.5–5.3)
SODIUM: 140 mmol/L (ref 135–146)

## 2016-09-01 LAB — POCT INR: INR: 1.3

## 2016-09-01 MED ORDER — FUROSEMIDE 20 MG PO TABS: 60.0000 mg | ORAL_TABLET | Freq: Two times a day (BID) | ORAL | 1 refills | Status: DC

## 2016-09-01 NOTE — Patient Instructions (Addendum)
Complete chest x ray on the first floor.  Stop losartan. You will be contacted about your neurology and cardiology. Complete lab work prior to leaving. Take your coumadin every night.  Keep your upcoming appointment with coumadin clinic.

## 2016-09-01 NOTE — Progress Notes (Signed)
Pre visit review using our clinic review tool, if applicable. No additional management support is needed unless otherwise documented below in the visit note. 

## 2016-09-01 NOTE — Progress Notes (Signed)
Subjective:    Patient ID: Mark [name], male    DOB: [birth year], 76 y.o.   MRN: [id number]  HPI  Mark [name] is a 76 yr old male who presents today for hospital follow up. Discharge summary is reviewed. He had extensive work up for acute encephalopathy which included neg CT had, neg UDS, normal ammonia, TSH, b12 , neg RPR.  Working diagnosis at the time of discharge was undiagnosed dementia with progression as the most likely cause.   Reports cough for several months. Reports that it has been getting progressively worse.   CHF- he was sent home on oxygen 3 L nasal cannula at discharge.  Notes that he is less winded with exertion now that he is on the oxygen.  His lasix dosing was increased during his hospitalization as well from 40mg  bid to 60mg  bid. Echo performed during his hospitalization showed LVEF 20-25%.   Wt Readings from Last 3 Encounters:  09/01/16 219 lb (99.3 kg)  08/22/16 221 lb 11.2 oz (100.6 kg)  08/14/16 222 lb (100.7 kg)   He was evaluated by psychiatry during his hospitalization and was deemed to have capacity.  He was sent home. He declined home health services at discharge.  Ex-wife who accompanies him here today does not feel that he is able to manage his ADL's at home very well.   Review of Systems Past Medical History:  Diagnosis Date  . AAA (abdominal aortic aneurysm) (HCC)    9.1 cm repair 05/2009 ( ENDOVASCULAR REPAIR ).  MORE RECENT RE-EVALUATION OF ENLARGING AAA BY DR. Trula Slade AND BY DR. Kathlene Cote  MAY 2015 - PT STATES HE UNDERSTANDS THAT NOTHING IS LEAKING AT PRESENT TIME AND HE IS TO FOLLOW UP WITH HIS DOCTORS FOR FUTURE MONITORING I  . Arthritis    LEFT HAND, RT KNEE (08/14/2016)  . Basal cell carcinoma    "right side of my nose; think they burned it off" (08/14/2016)  . CAD (coronary artery disease)    a. s/p PCI to LAD/diagonal, subtotal of PDA 2006. b. Negative nuc 2014.  Marland Kitchen CHF (congestive heart failure) ([place]) 08/14/2016  . Chronic atrial fibrillation  ([place])   . CKD (chronic kidney disease), stage III    a. stage III-IV, Cr baseline appears 1.8-2.0  . COPD (chronic obstructive pulmonary disease) ([place])   . Depression   . GERD (gastroesophageal reflux disease)   . History of kidney stones   . History of pneumothorax    LONG TIME AGO  . Hyperlipidemia   . Hypertension   . Migraine    "very rare the last 20 years" (08/14/2016)  . Myocardial infarction ([place]) 2006  . Osteoarthritis   . Renal cell carcinoma ([place])   . Shingles outbreak    PT NOTICED SKIN RASH RT GROIN AND RIGHT TRUNK ON MONDAY 10/27/13 - NOT A LOT OF DISCOMFORT- NOT ON ANY ORAL MEDS TO TREAT.  . Sleep apnea    "dx'd; didn't try mask" (08/14/2016)  . Stroke [facility])    "CT showed I've had old strokes; never knew about it before today" (08/14/2016)     Social History   Social History  . Marital status: Divorced    Spouse name: N/A  . Number of children: 2  . Years of education: N/A   Occupational History  .  Retired   Social History Main Topics  . Smoking status: Former Smoker    Packs/day: 1.50    Years: 23.00    Types: Cigarettes  Quit date: 08/24/1980  . Smokeless tobacco: Never Used  . Alcohol use 1.5 oz/week    3 Standard drinks or equivalent per week     Comment: 08/14/2016 "maybe 2-3 drinks/month average"  . Drug use: No  . Sexual activity: Not on file   Other Topics Concern  . Not on file   Social History Narrative   Retired   Divorced   2 sons 7 &30     Occupation:  Part time sports broadcaster    Smoking Status:  quit (08/24/1980)   Packs/Day:  1.0    Caffeine use/day:  3 beverages daily    Does Patient Exercise:  no   Alcohol Use - yes    Past Surgical History:  Procedure Laterality Date  . ABDOMINAL AORTAGRAM N/A 09/24/2013   Procedure: ABDOMINAL Maxcine Ham;  Surgeon: Serafina Mitchell, MD;  Location: [facility] CATH LAB;  Service: Cardiovascular;  Laterality: N/A;  . ABDOMINAL AORTIC ANEURYSM REPAIR  JAN 2011   ENDOVASCULAR - DR. BRABHAM  . CARDIAC  CATHETERIZATION  07/04/2004   Archie Endo 07/04/2004  . COLONOSCOPY  early 1990s   /notes 11/11/2008  . CORONARY ANGIOPLASTY WITH STENT PLACEMENT  07/05/2004   notes 07/05/2004  . KNEE ARTHROSCOPY Right   . LITHOTRIPSY    . ROBOT ASSISTED LAPAROSCOPIC NEPHRECTOMY Left 11/06/2013   Procedure: ROBOTIC ASSISTED LAPAROSCOPIC RADICAL  NEPHRECTOMY;  Surgeon: Alexis Frock, MD;  Location: [facility] ORS;  Service: Urology;  Laterality: Left;  . TONSILLECTOMY  1964    Family History  Problem Relation Age of Onset  . Pulmonary embolism Mother     died of PE  . Heart disease Mother     before age 75  . Anuerysm Father     history of popliteal  . Coronary artery disease    . Hyperlipidemia    . Hypertension    . Prostate cancer    . Other Neg Hx     polycystic kidney disease, and colon cancer  . Colon cancer Neg Hx     Allergies  Allergen Reactions  . Ace Inhibitors     REACTION: Cough    Current Outpatient Prescriptions on File Prior to Visit  Medication Sig Dispense Refill  . atorvastatin (LIPITOR) 40 MG tablet TAKE 1 TABLET ONE TIME DAILY 90 tablet 1  . carvedilol (COREG) 25 MG tablet Take 1 tablet (25 mg total) by mouth 2 (two) times daily with a meal. 60 tablet 0  . Coenzyme Q10 200 MG capsule Take 200 mg by mouth daily.    . furosemide (LASIX) 20 MG tablet Take 3 tablets (60 mg total) by mouth 2 (two) times daily. 30 tablet 0  . isosorbide dinitrate (ISORDIL) 20 MG tablet Take 1 tablet (20 mg total) by mouth 3 (three) times daily. 270 tablet 1  . Multiple Vitamins-Minerals (MACUVITE EYE CARE PO) Take 1 capsule by mouth daily.    . Omega-3 Fatty Acids (FISH OIL) 1200 MG CAPS Take 1,200 mg by mouth daily.     . potassium chloride SA (K-DUR,KLOR-CON) 20 MEQ tablet Take 1 tablet (20 mEq total) by mouth every evening. 30 tablet 0  . ranitidine (ZANTAC) 150 MG tablet Take 1 tablet (150 mg total) by mouth 2 (two) times daily. 180 tablet 1  . sertraline (ZOLOFT) 100 MG tablet Take 1 tablet (100 mg  total) by mouth every evening. 90 tablet 1  . tamsulosin (FLOMAX) 0.4 MG CAPS capsule TAKE 1 CAPSULE (0.4 MG TOTAL) DAILY AFTER BREAKFAST. 90 capsule 0  .  traZODone (DESYREL) 50 MG tablet Take 1 tablet (50 mg total) by mouth at bedtime. 30 tablet 0  . warfarin (COUMADIN) 5 MG tablet TAKE 1 TABLET EVERY DAY EXCEPT 1.5 TABLETS ON WEDNESDAY. 90 tablet 1  . albuterol (PROVENTIL HFA;VENTOLIN HFA) 108 (90 Base) MCG/ACT inhaler Inhale 2 puffs into the lungs every 6 (six) hours as needed for wheezing or shortness of breath. (Patient not taking: Reported on 09/01/2016) 1 Inhaler 0  . calcium elemental as carbonate (BARIATRIC TUMS ULTRA) 400 MG chewable tablet     . feeding supplement, ENSURE ENLIVE, (ENSURE ENLIVE) LIQD Take 237 mLs by mouth daily. (Patient not taking: Reported on 09/01/2016) 237 mL 12  . losartan (COZAAR) 50 MG tablet Take 1 tablet (50 mg total) by mouth every evening. (Patient not taking: Reported on 09/01/2016) 90 tablet 0   No current facility-administered medications on file prior to visit.     BP (!) 102/58 (BP Location: Right Arm, Cuff Size: Normal)   Pulse (!) 56   Temp 97.6 F (36.4 C) (Oral)   Resp 18   Ht 5\' 11"  (1.803 m)   Wt 219 lb (99.3 kg)   SpO2 97%   BMI 30.54 kg/m       Objective:   Physical Exam  Constitutional: He is oriented to person, place, and time. He appears well-developed and well-nourished. No distress.  HENT:  Head: Normocephalic and atraumatic.  Cardiovascular: Normal rate and regular rhythm.   No murmur heard. Pulmonary/Chest: Effort normal and breath sounds normal. No respiratory distress. He has no wheezes. He has no rales.  Some rhonchi noted,   Musculoskeletal:  2+ bilateral LE edema  Neurological: He is alert and oriented to person, place, and time.  Skin: Skin is warm and dry.  Psychiatric: He has a normal mood and affect. His speech is normal and behavior is normal. Thought content normal. Cognition and memory are impaired. He exhibits  abnormal recent memory.          Assessment & Plan:  Dementia- will refer to neurology for further evaluation.    Atrial fibrillation- INR performed today and was subtherapeutic.  Pt thinks he may have forgotten some doses. Advised pt to purchase some pill trays to help him remember. Restart coumadin, keep upcoming appointment with coumadin clinic for recheck. After further evaluation, he is agreeable to home health RN. Will also request SW. Family has meals on wheels coming out for evaluation which I think would be helpful for him.   CHF- Continue lasix.  Still has some significant LE so I do not want to decrease his lasix dose. I will arrange follow up with his cardiologist.    Cough- Could be related to his ARB. CXR without acute changes. Since his BP is low, I d/c'd his losartan as this may be contributing to his cough. I will notify his cardiologist of this change.   Renal Insufficiency- He saw Mark [name] recently. Follow up Cr was up a bit since his diuretics have increased. I have forwarded this to his nephrologist. I am concerned about decreasing his lasix due to he edema/

## 2016-09-03 ENCOUNTER — Telehealth: Payer: Self-pay | Admitting: Family

## 2016-09-03 NOTE — Telephone Encounter (Signed)
Kidney function has worsened slightly, but this is likely related to his increased dose of lasix which it appears he is requiring to manage his fluids. CXR neg for pneumonia. Shows a small amount of fluid at the base of his left lung.  I will share his blood work results with his nephrologist,Dr. Posey Pronto.

## 2016-09-04 ENCOUNTER — Encounter: Payer: Self-pay | Admitting: Neurology

## 2016-09-04 NOTE — Telephone Encounter (Signed)
-----   Message from Lelon Perla, MD sent at 09/04/2016  7:38 AM EDT ----- Ok to hold ARB; will review in office. Kirk Ruths  ----- Message ----- From: [name], NP Sent: 09/04/2016   7:11 AM To: Lelon Perla, MD  Dr. Stanford Breed,  I recently saw Mr. Hippler for hospital follow up (had AMS/CHF- diagnosed with demenia). He is due to follow up with you and and I am working on getting this arranged.  He complained of cough and BP was soft. I held his ARB, however I wanted to make sure that this was OK with you from a cardiac standpoint.   Thanks,  [employer]

## 2016-09-05 NOTE — Telephone Encounter (Signed)
Left message on voicemail for pt to return my call.

## 2016-09-06 ENCOUNTER — Ambulatory Visit: Payer: Commercial Managed Care - HMO

## 2016-09-06 ENCOUNTER — Telehealth: Payer: Self-pay | Admitting: *Deleted

## 2016-09-06 ENCOUNTER — Ambulatory Visit (INDEPENDENT_AMBULATORY_CARE_PROVIDER_SITE_OTHER): Payer: Medicare HMO | Admitting: Behavioral Health

## 2016-09-06 DIAGNOSIS — I4891 Unspecified atrial fibrillation: Secondary | ICD-10-CM | POA: Diagnosis not present

## 2016-09-06 LAB — POCT INR: INR: 1.9

## 2016-09-06 NOTE — Telephone Encounter (Signed)
Noted  

## 2016-09-06 NOTE — Patient Instructions (Signed)
Per Dr. Lorelei Pont: Continue taking Coumadin 5 mg daily, except 7.5 mg on Wednesdays. Return in 2 weeks for INR check.

## 2016-09-06 NOTE — Telephone Encounter (Signed)
Notified pt of below results and he voices understanding. Result faxed to Dr Posey Pronto (nephrologist)

## 2016-09-06 NOTE — Progress Notes (Signed)
Pre visit review using our clinic review tool, if applicable. No additional management support is needed unless otherwise documented below in the visit note.  Patient came in clinic today for INR check. He reported adherence to current medication regimen. Patient voiced no new changes with medications or diet. All findings were negative. INR reading during visit was 1.9.  Per Dr. Lorelei Pont: Continue taking Coumadin 5 mg daily, except 7.5 mg on Wednesdays. Return in 2 weeks for INR check.  Informed patient of the provider's recommendations. He verbalized understanding. Next appointment scheduled for 09/20/16 at 10:30 AM.

## 2016-09-06 NOTE — Telephone Encounter (Signed)
Received call from RN, Judeen Hammans letting us know the start date of care for pt will be tomorrow, 09/07/16.

## 2016-09-12 NOTE — Progress Notes (Signed)
HPI: FU atrial fibrillation, coronary disease, hypertension and hyperlipidemia. The patient's cardiac history dates back to 2006 when he had PCI of his LAD and first diagonal. Note his LV function was normal. He also had a subtotal of his PDA. Myoview in July of 2014 showed ejection fraction 69% and normal perfusion. Patient also with h/o atrial fibrillation treated with rate control and anticoagulation. Holter monitor in April of 2015 showed atrial fibrillation with PVCs or aberrantly conducted beats; rate controlled. Patient had repair of AAA in Jan 2011. Abd CTA 5/15 showed enargement of aneurysm sac and endoleak could not be excluded. Now followed by vascular surgery. Echocardiogram June 2017 showed normal LV systolic function, moderate biatrial enlargement, mild MR, dilated aortic root. Admitted with acute encephalopathy 4/18. Felt to have undiagnosed dementia. Also with CHF and new LBBB. Echo 4/18 showed EF 20-25, mild LVE, mild LVH, mild AI, moderate MR, biatrial enlargement, mild RVE, dilated aortic root (4.6 cm). Ischemia eval felt necessary if mental status improved. Since last seen, patient has some dyspnea on exertion but denies orthopnea, PND, pedal edema, chest pain or syncope.  Current Outpatient Prescriptions  Medication Sig Dispense Refill  . albuterol (PROVENTIL HFA;VENTOLIN HFA) 108 (90 Base) MCG/ACT inhaler Inhale 2 puffs into the lungs every 6 (six) hours as needed for wheezing or shortness of breath. 1 Inhaler 0  . atorvastatin (LIPITOR) 40 MG tablet TAKE 1 TABLET ONE TIME DAILY 90 tablet 1  . calcium elemental as carbonate (BARIATRIC TUMS ULTRA) 400 MG chewable tablet     . carvedilol (COREG) 25 MG tablet Take 1 tablet (25 mg total) by mouth 2 (two) times daily with a meal. 60 tablet 0  . Coenzyme Q10 200 MG capsule Take 200 mg by mouth daily.    . feeding supplement, ENSURE ENLIVE, (ENSURE ENLIVE) LIQD Take 237 mLs by mouth daily. 237 mL 12  . furosemide (LASIX) 20 MG  tablet Take 3 tablets (60 mg total) by mouth 2 (two) times daily. 540 tablet 1  . isosorbide dinitrate (ISORDIL) 20 MG tablet Take 1 tablet (20 mg total) by mouth 3 (three) times daily. 270 tablet 1  . Multiple Vitamins-Minerals (MACUVITE EYE CARE PO) Take 1 capsule by mouth daily.    . Omega-3 Fatty Acids (FISH OIL) 1200 MG CAPS Take 1,200 mg by mouth daily.     . potassium chloride SA (K-DUR,KLOR-CON) 20 MEQ tablet Take 1 tablet (20 mEq total) by mouth every evening. 30 tablet 0  . ranitidine (ZANTAC) 150 MG tablet Take 1 tablet (150 mg total) by mouth 2 (two) times daily. 180 tablet 1  . sertraline (ZOLOFT) 100 MG tablet Take 1 tablet (100 mg total) by mouth every evening. 90 tablet 1  . tamsulosin (FLOMAX) 0.4 MG CAPS capsule TAKE 1 CAPSULE (0.4 MG TOTAL) DAILY AFTER BREAKFAST. 30 capsule 0  . traZODone (DESYREL) 50 MG tablet Take 1 tablet (50 mg total) by mouth at bedtime. 30 tablet 0  . warfarin (COUMADIN) 5 MG tablet TAKE 1 TABLET EVERY DAY EXCEPT 1.5 TABLETS ON WEDNESDAY. 90 tablet 1   No current facility-administered medications for this visit.      Past Medical History:  Diagnosis Date  . AAA (abdominal aortic aneurysm) (HCC)    9.1 cm repair 05/2009 ( ENDOVASCULAR REPAIR ).  MORE RECENT RE-EVALUATION OF ENLARGING AAA BY DR. Trula Slade AND BY DR. Kathlene Cote  MAY 2015 - PT STATES HE UNDERSTANDS THAT NOTHING IS LEAKING AT PRESENT TIME AND HE IS  TO FOLLOW UP WITH HIS DOCTORS FOR FUTURE MONITORING I  . Arthritis    LEFT HAND, RT KNEE (08/14/2016)  . Basal cell carcinoma    "right side of my nose; think they burned it off" (08/14/2016)  . CAD (coronary artery disease)    a. s/p PCI to LAD/diagonal, subtotal of PDA 2006. b. Negative nuc 2014.  Marland Kitchen CHF (congestive heart failure) ([place]) 08/14/2016  . Chronic atrial fibrillation ([place])   . CKD (chronic kidney disease), stage III    a. stage III-IV, Cr baseline appears 1.8-2.0  . COPD (chronic obstructive pulmonary disease) ([place])   . Depression   .  GERD (gastroesophageal reflux disease)   . History of kidney stones   . History of pneumothorax    LONG TIME AGO  . Hyperlipidemia   . Hypertension   . Migraine    "very rare the last 20 years" (08/14/2016)  . Myocardial infarction ([place]) 2006  . Osteoarthritis   . Renal cell carcinoma ([place])   . Shingles outbreak    PT NOTICED SKIN RASH RT GROIN AND RIGHT TRUNK ON MONDAY 10/27/13 - NOT A LOT OF DISCOMFORT- NOT ON ANY ORAL MEDS TO TREAT.  . Sleep apnea    "dx'd; didn't try mask" (08/14/2016)  . Stroke [facility])    "CT showed I've had old strokes; never knew about it before today" (08/14/2016)    Past Surgical History:  Procedure Laterality Date  . ABDOMINAL AORTAGRAM N/A 09/24/2013   Procedure: ABDOMINAL Maxcine Ham;  Surgeon: Serafina Mitchell, MD;  Location: [facility] CATH LAB;  Service: Cardiovascular;  Laterality: N/A;  . ABDOMINAL AORTIC ANEURYSM REPAIR  JAN 2011   ENDOVASCULAR - DR. BRABHAM  . CARDIAC CATHETERIZATION  07/04/2004   Archie Endo 07/04/2004  . COLONOSCOPY  early 1990s   /notes 11/11/2008  . CORONARY ANGIOPLASTY WITH STENT PLACEMENT  07/05/2004   notes 07/05/2004  . KNEE ARTHROSCOPY Right   . LITHOTRIPSY    . ROBOT ASSISTED LAPAROSCOPIC NEPHRECTOMY Left 11/06/2013   Procedure: ROBOTIC ASSISTED LAPAROSCOPIC RADICAL  NEPHRECTOMY;  Surgeon: Alexis Frock, MD;  Location: [facility] ORS;  Service: Urology;  Laterality: Left;  . TONSILLECTOMY  1964    Social History   Social History  . Marital status: Divorced    Spouse name: N/A  . Number of children: 2  . Years of education: N/A   Occupational History  .  Retired   Social History Main Topics  . Smoking status: Former Smoker    Packs/day: 1.50    Years: 23.00    Types: Cigarettes    Quit date: 08/24/1980  . Smokeless tobacco: Never Used  . Alcohol use 1.5 oz/week    3 Standard drinks or equivalent per week     Comment: 08/14/2016 "maybe 2-3 drinks/month average"  . Drug use: No  . Sexual activity: Not on file   Other Topics Concern  .  Not on file   Social History Narrative   Retired   Divorced   2 sons 40 &30     Occupation:  Part time sports Tax adviser    Smoking Status:  quit (08/24/1980)   Packs/Day:  1.0    Caffeine use/day:  3 beverages daily    Does Patient Exercise:  no   Alcohol Use - yes    Family History  Problem Relation Age of Onset  . Pulmonary embolism Mother        died of PE  . Heart disease Mother        before  age 43  . Anuerysm Father        history of popliteal  . Coronary artery disease Unknown   . Hyperlipidemia Unknown   . Hypertension Unknown   . Prostate cancer Unknown   . Other Neg Hx        polycystic kidney disease, and colon cancer  . Colon cancer Neg Hx     ROS: no fevers or chills, productive cough, hemoptysis, dysphasia, odynophagia, melena, hematochezia, dysuria, hematuria, rash, seizure activity, orthopnea, PND, pedal edema, claudication. Remaining systems are negative.  Physical Exam: Well-developed well-nourished in no acute distress.  Skin is warm and dry.  HEENT is normal.  Neck is supple.  Chest is clear to auscultation with normal expansion.  Cardiovascular exam is irregular  Abdominal exam nontender or distended. No masses palpated. Extremities show trace edema. neuro grossly intact  ECG- personally reviewed  A/P  1 Permanent atrial fibrillation-we will continue with present dose of beta blocker for rate control. Continue Coumadin.   2 Coronary artery disease-no recent chest pain. Continue statin. No aspirin given need for Coumadin.  3 hypertension-blood pressure controlled. Continue present medications.  4 hyperlipidemia-continue statin.  5 abdominal aortic aneurysm-monitored by vascular surgery. He has not seen them for some time and I have recommended that he follow-up with them in the near future.   6 renal insufficiency-followed by primary care.  7 cardiomyopathy-LV function noted to be newly reduced during recent admission. Etiology unclear.  I will arrange a 24-hour Holter monitor to make sure that heart rate is adequately controlled. I will schedule a nuclear study to screen for ischemia. I'm hesitant to arrange catheterization as he has baseline renal insufficiency and risk of contrast nephropathy would be significant. Continue beta blocker and nitrates. Add hydralazine 10 mg by mouth 3 times a day. No ACE inhibitor given renal insufficiency. He will need follow-up echocardiogram in 3 months after medications fully titrated.  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 I will see if we can arrange a noncontrast chest CT to size his aortic root. I would like to avoid CTA or MRA given renal insufficiency.  9 possible early dementia-patient will need close follow-up with primary care.  Kirk Ruths, MD

## 2016-09-15 ENCOUNTER — Ambulatory Visit (INDEPENDENT_AMBULATORY_CARE_PROVIDER_SITE_OTHER): Payer: Medicare HMO | Admitting: Family

## 2016-09-15 ENCOUNTER — Encounter: Payer: Self-pay | Admitting: Family

## 2016-09-15 ENCOUNTER — Telehealth: Payer: Self-pay | Admitting: Family

## 2016-09-15 ENCOUNTER — Ambulatory Visit (HOSPITAL_BASED_OUTPATIENT_CLINIC_OR_DEPARTMENT_OTHER)
Admission: RE | Admit: 2016-09-15 | Discharge: 2016-09-15 | Disposition: A | Discharge status: Home / Self Care | Payer: Medicare HMO | Source: Ambulatory Visit | Attending: Family | Admitting: Family

## 2016-09-15 VITALS — BP 141/112 | HR 74 | Temp 97.7°F | Resp 18 | Ht 71.0 in | Wt 219.6 lb

## 2016-09-15 DIAGNOSIS — I517 Cardiomegaly: Secondary | ICD-10-CM | POA: Insufficient documentation

## 2016-09-15 DIAGNOSIS — J40 Bronchitis, not specified as acute or chronic: Secondary | ICD-10-CM

## 2016-09-15 DIAGNOSIS — F0391 Unspecified dementia with behavioral disturbance: Secondary | ICD-10-CM

## 2016-09-15 DIAGNOSIS — R059 Cough, unspecified: Secondary | ICD-10-CM

## 2016-09-15 DIAGNOSIS — I10 Essential (primary) hypertension: Secondary | ICD-10-CM

## 2016-09-15 DIAGNOSIS — S39012A Strain of muscle, fascia and tendon of lower back, initial encounter: Secondary | ICD-10-CM

## 2016-09-15 DIAGNOSIS — J90 Pleural effusion, not elsewhere classified: Secondary | ICD-10-CM | POA: Insufficient documentation

## 2016-09-15 DIAGNOSIS — R05 Cough: Secondary | ICD-10-CM | POA: Insufficient documentation

## 2016-09-15 NOTE — Telephone Encounter (Signed)
cxr is negative for pneumonia. I would like him to add claritin 10mg  once daily and see if this helps his cough/congestion. Follow up in 1 week. Call if symptoms worsen.

## 2016-09-15 NOTE — Patient Instructions (Signed)
Add claritin 10mg  once daily for your nasal congestion and itching. Complete chest x ray on the first floor.

## 2016-09-15 NOTE — Progress Notes (Signed)
Subjective:    Patient ID: Mark [name], male    DOB: [birth year], 76 y.o.   MRN: [id number]  HPI  Mark [name] is a 76 yr old male who presents today with chief complaint of cough. Reports that his cough has been present x 10 days.  More productive recently.  Reports low grade subjective temp. Reports mild nasal congestion.   Right low back pain- began yesterday when he bent over to pick up a piece of paper.  Took a tylenol which helped his pain.   He continues to have hallucinations, "like dreams, but I always end up back at my house and I am glad that they were only a dream."  Had hallucination that he had a date with his ex-wife.     Review of Systems    see HPI  Past Medical History:  Diagnosis Date  . AAA (abdominal aortic aneurysm) (HCC)    9.1 cm repair 05/2009 ( ENDOVASCULAR REPAIR ).  MORE RECENT RE-EVALUATION OF ENLARGING AAA BY DR. Trula Slade AND BY DR. Kathlene Cote  MAY 2015 - PT STATES HE UNDERSTANDS THAT NOTHING IS LEAKING AT PRESENT TIME AND HE IS TO FOLLOW UP WITH HIS DOCTORS FOR FUTURE MONITORING I  . Arthritis    LEFT HAND, RT KNEE (08/14/2016)  . Basal cell carcinoma    "right side of my nose; think they burned it off" (08/14/2016)  . CAD (coronary artery disease)    a. s/p PCI to LAD/diagonal, subtotal of PDA 2006. b. Negative nuc 2014.  Marland Kitchen CHF (congestive heart failure) ([place]) 08/14/2016  . Chronic atrial fibrillation ([place])   . CKD (chronic kidney disease), stage III    a. stage III-IV, Cr baseline appears 1.8-2.0  . COPD (chronic obstructive pulmonary disease) ([place])   . Depression   . GERD (gastroesophageal reflux disease)   . History of kidney stones   . History of pneumothorax    LONG TIME AGO  . Hyperlipidemia   . Hypertension   . Migraine    "very rare the last 20 years" (08/14/2016)  . Myocardial infarction ([place]) 2006  . Osteoarthritis   . Renal cell carcinoma ([place])   . Shingles outbreak    PT NOTICED SKIN RASH RT GROIN AND RIGHT TRUNK ON MONDAY 10/27/13 -  NOT A LOT OF DISCOMFORT- NOT ON ANY ORAL MEDS TO TREAT.  . Sleep apnea    "dx'd; didn't try mask" (08/14/2016)  . Stroke [facility])    "CT showed I've had old strokes; never knew about it before today" (08/14/2016)     Social History   Social History  . Marital status: Divorced    Spouse name: N/A  . Number of children: 2  . Years of education: N/A   Occupational History  .  Retired   Social History Main Topics  . Smoking status: Former Smoker    Packs/day: 1.50    Years: 23.00    Types: Cigarettes    Quit date: 08/24/1980  . Smokeless tobacco: Never Used  . Alcohol use 1.5 oz/week    3 Standard drinks or equivalent per week     Comment: 08/14/2016 "maybe 2-3 drinks/month average"  . Drug use: No  . Sexual activity: Not on file   Other Topics Concern  . Not on file   Social History Narrative   Retired   Divorced   2 sons 68 &30     Occupation:  Part time sports broadcaster    Smoking Status:  quit (08/24/1980)  Packs/Day:  1.0    Caffeine use/day:  3 beverages daily    Does Patient Exercise:  no   Alcohol Use - yes    Past Surgical History:  Procedure Laterality Date  . ABDOMINAL AORTAGRAM N/A 09/24/2013   Procedure: ABDOMINAL Maxcine Ham;  Surgeon: Serafina Mitchell, MD;  Location: [facility] CATH LAB;  Service: Cardiovascular;  Laterality: N/A;  . ABDOMINAL AORTIC ANEURYSM REPAIR  JAN 2011   ENDOVASCULAR - DR. BRABHAM  . CARDIAC CATHETERIZATION  07/04/2004   Archie Endo 07/04/2004  . COLONOSCOPY  early 1990s   /notes 11/11/2008  . CORONARY ANGIOPLASTY WITH STENT PLACEMENT  07/05/2004   notes 07/05/2004  . KNEE ARTHROSCOPY Right   . LITHOTRIPSY    . ROBOT ASSISTED LAPAROSCOPIC NEPHRECTOMY Left 11/06/2013   Procedure: ROBOTIC ASSISTED LAPAROSCOPIC RADICAL  NEPHRECTOMY;  Surgeon: Alexis Frock, MD;  Location: [facility] ORS;  Service: Urology;  Laterality: Left;  . TONSILLECTOMY  1964    Family History  Problem Relation Age of Onset  . Pulmonary embolism Mother        died of PE  . Heart  disease Mother        before age 56  . Anuerysm Father        history of popliteal  . Coronary artery disease Unknown   . Hyperlipidemia Unknown   . Hypertension Unknown   . Prostate cancer Unknown   . Other Neg Hx        polycystic kidney disease, and colon cancer  . Colon cancer Neg Hx     Allergies  Allergen Reactions  . Ace Inhibitors     REACTION: Cough    Current Outpatient Prescriptions on File Prior to Visit  Medication Sig Dispense Refill  . atorvastatin (LIPITOR) 40 MG tablet TAKE 1 TABLET ONE TIME DAILY 90 tablet 1  . calcium elemental as carbonate (BARIATRIC TUMS ULTRA) 400 MG chewable tablet     . carvedilol (COREG) 25 MG tablet Take 1 tablet (25 mg total) by mouth 2 (two) times daily with a meal. 60 tablet 0  . Coenzyme Q10 200 MG capsule Take 200 mg by mouth daily.    . feeding supplement, ENSURE ENLIVE, (ENSURE ENLIVE) LIQD Take 237 mLs by mouth daily. 237 mL 12  . furosemide (LASIX) 20 MG tablet Take 3 tablets (60 mg total) by mouth 2 (two) times daily. 540 tablet 1  . isosorbide dinitrate (ISORDIL) 20 MG tablet Take 1 tablet (20 mg total) by mouth 3 (three) times daily. 270 tablet 1  . Multiple Vitamins-Minerals (MACUVITE EYE CARE PO) Take 1 capsule by mouth daily.    . Omega-3 Fatty Acids (FISH OIL) 1200 MG CAPS Take 1,200 mg by mouth daily.     . potassium chloride SA (K-DUR,KLOR-CON) 20 MEQ tablet Take 1 tablet (20 mEq total) by mouth every evening. 30 tablet 0  . ranitidine (ZANTAC) 150 MG tablet Take 1 tablet (150 mg total) by mouth 2 (two) times daily. 180 tablet 1  . sertraline (ZOLOFT) 100 MG tablet Take 1 tablet (100 mg total) by mouth every evening. 90 tablet 1  . tamsulosin (FLOMAX) 0.4 MG CAPS capsule TAKE 1 CAPSULE (0.4 MG TOTAL) DAILY AFTER BREAKFAST. 90 capsule 0  . traZODone (DESYREL) 50 MG tablet Take 1 tablet (50 mg total) by mouth at bedtime. 30 tablet 0  . warfarin (COUMADIN) 5 MG tablet TAKE 1 TABLET EVERY DAY EXCEPT 1.5 TABLETS ON WEDNESDAY.  90 tablet 1  . albuterol (PROVENTIL HFA;VENTOLIN HFA) 108 (90  Base) MCG/ACT inhaler Inhale 2 puffs into the lungs every 6 (six) hours as needed for wheezing or shortness of breath. (Patient not taking: Reported on 09/01/2016) 1 Inhaler 0   No current facility-administered medications on file prior to visit.     BP (!) 141/112 (BP Location: Right Arm, Cuff Size: Normal)   Pulse 74   Temp 97.7 F (36.5 C) (Oral)   Resp 18   Ht 5\' 11"  (1.803 m)   Wt 219 lb 9.6 oz (99.6 kg)   SpO2 95% Comment: room air  BMI 30.63 kg/m    Objective:   Physical Exam  Constitutional: He appears well-developed and well-nourished. No distress.  HENT:  Head: Normocephalic and atraumatic.  Right Ear: Tympanic membrane and ear canal normal.  Left Ear: Tympanic membrane and ear canal normal.  Mouth/Throat: Oropharynx is clear and moist. No oropharyngeal exudate, posterior oropharyngeal edema or posterior oropharyngeal erythema.  Cardiovascular: Normal rate and regular rhythm.   No murmur heard. Pulmonary/Chest: Effort normal and breath sounds normal. No respiratory distress. He has no wheezes. He has no rales.  Musculoskeletal: He exhibits no edema.  Lymphadenopathy:    He has no cervical adenopathy.  Neurological: He is alert.  Skin: Skin is warm and dry.  Psychiatric: He has a normal mood and affect. His behavior is normal. Thought content normal.          Assessment & Plan:  Bronchitis- New. Will obtain CXR to rule out PNA. Consider abx pending review of CXR.  Dementia with behavioral disturbance- advised pt that his hallucination are related to his dementia. He has an upcoming appointment with Dr. Delice Lesch in July.   Lumbar strain- improved with tylenol. Advised pt OK to continue heat/prn tylenol. Would avoid NSAIDS due to renal insufficiency and coumadin use.   HTN- BP is up to date.  It appears that he has been taking metoprolol. He is also on coreg. D/c metoprolol. I am unsure of his med  compliance as his BP was much lower last visit, therefore I don't want to add anything just yet. Plan follow up in 1 month.   BP Readings from Last 3 Encounters:  09/15/16 (!) 141/112  09/01/16 (!) 102/58  08/22/16 133/84

## 2016-09-18 NOTE — Telephone Encounter (Signed)
Left detailed message on cell and to call if any questions. 

## 2016-09-19 ENCOUNTER — Telehealth: Payer: Self-pay | Admitting: Family

## 2016-09-19 ENCOUNTER — Encounter: Payer: Self-pay | Admitting: Cardiology

## 2016-09-19 MED ORDER — TAMSULOSIN HCL 0.4 MG PO CAPS: ORAL_CAPSULE | ORAL | 0 refills | Status: DC

## 2016-09-19 NOTE — Telephone Encounter (Signed)
°  Relation to IW:PYKD Call back number:[phone] Pharmacy: [facility] [place], [place] [phone] (Phone) [phone] (Fax)      Reason for call:  Patient is currently at the pharmacy picking up he's medication, requesting tamsulosin (FLOMAX) 0.4 MG CAPS capsule, please advise

## 2016-09-19 NOTE — Telephone Encounter (Signed)
30 day supply sent to [facility]. Left message for pt to return my call and confirm if he needs 90 day supply sent to mail order?

## 2016-09-20 ENCOUNTER — Encounter: Payer: Self-pay | Admitting: Cardiology

## 2016-09-20 ENCOUNTER — Ambulatory Visit: Payer: Self-pay

## 2016-09-20 ENCOUNTER — Ambulatory Visit (INDEPENDENT_AMBULATORY_CARE_PROVIDER_SITE_OTHER): Payer: Medicare HMO | Admitting: Cardiology

## 2016-09-20 VITALS — BP 100/73 | HR 90 | Ht 71.0 in | Wt 219.8 lb

## 2016-09-20 DIAGNOSIS — I712 Thoracic aortic aneurysm, without rupture, unspecified: Secondary | ICD-10-CM

## 2016-09-20 DIAGNOSIS — I2510 Atherosclerotic heart disease of native coronary artery without angina pectoris: Secondary | ICD-10-CM

## 2016-09-20 DIAGNOSIS — I480 Paroxysmal atrial fibrillation: Secondary | ICD-10-CM | POA: Diagnosis not present

## 2016-09-20 MED ORDER — HYDRALAZINE HCL 10 MG PO TABS: 10.0000 mg | ORAL_TABLET | Freq: Three times a day (TID) | ORAL | 3 refills | Status: DC

## 2016-09-20 NOTE — Patient Instructions (Signed)
Medication Instructions:   START HYDRALAZINE 10 MG ONE TABLET THREE TIMES DAILY  Testing/Procedures:  Your physician has recommended that you wear a 24 HOUR holter monitor. Holter monitors are medical devices that record the heart's electrical activity. Doctors most often use these monitors to diagnose arrhythmias. Arrhythmias are problems with the speed or rhythm of the heartbeat. The monitor is a small, portable device. You can wear one while you do your normal daily activities. This is usually used to diagnose what is causing palpitations/syncope (passing out).   Your physician has requested that you have a lexiscan myoview. For further information please visit [email or web address]. Please follow instruction sheet, as given.   Non-Cardiac CT scanning, (CAT scanning), is a noninvasive, special x-ray that produces cross-sectional images of the body using x-rays and a computer. CT scans help physicians diagnose and treat medical conditions. For some CT exams, a contrast material is used to enhance visibility in the area of the body being studied. CT scans provide greater clarity and reveal more details than regular x-ray exams.   Follow-Up:  Your physician recommends that you schedule a follow-up appointment in: [place]   If you need a refill on your cardiac medications before your next appointment, please call your pharmacy.

## 2016-09-21 ENCOUNTER — Telehealth: Payer: Self-pay | Admitting: *Deleted

## 2016-09-21 NOTE — Telephone Encounter (Signed)
AWV scheduled 09/29/16 @130  prior to PCP f/u.

## 2016-09-22 ENCOUNTER — Ambulatory Visit (INDEPENDENT_AMBULATORY_CARE_PROVIDER_SITE_OTHER): Payer: Medicare HMO | Admitting: Family

## 2016-09-22 ENCOUNTER — Encounter: Payer: Self-pay | Admitting: Family

## 2016-09-22 VITALS — BP 130/94 | HR 126 | Temp 97.6°F | Resp 16 | Ht 71.0 in | Wt 221.0 lb

## 2016-09-22 DIAGNOSIS — R05 Cough: Secondary | ICD-10-CM | POA: Diagnosis not present

## 2016-09-22 DIAGNOSIS — I482 Chronic atrial fibrillation, unspecified: Secondary | ICD-10-CM

## 2016-09-22 DIAGNOSIS — J9621 Acute and chronic respiratory failure with hypoxia: Secondary | ICD-10-CM | POA: Diagnosis not present

## 2016-09-22 DIAGNOSIS — R059 Cough, unspecified: Secondary | ICD-10-CM

## 2016-09-22 LAB — POCT INR: INR: 2.8

## 2016-09-22 MED ORDER — TAMSULOSIN HCL 0.4 MG PO CAPS: ORAL_CAPSULE | ORAL | 1 refills | Status: AC

## 2016-09-22 NOTE — Addendum Note (Signed)
Addended by: Kelle Darting A on: 09/22/2016 09:09 AM   Modules accepted: Orders

## 2016-09-22 NOTE — Progress Notes (Signed)
Subjective:    Patient ID: Mark [name], male    DOB: 1940/07/21, 76 y.o.   MRN: [id number]  HPI  Mark [name] is a 76 yr old male who presents today for follow up.  HTN-  BP Readings from Last 3 Encounters:  09/22/16 (!) 130/94  09/20/16 100/73  09/15/16 (!) 141/112   Cough- notes cough is improving.  "its breaking up."    CXR was negative for PNA.  We treated him with claritin 10 mg once daily.    Review of Systems See HPI  Past Medical History:  Diagnosis Date  . AAA (abdominal aortic aneurysm) (HCC)    9.1 cm repair 05/2009 ( ENDOVASCULAR REPAIR ).  MORE RECENT RE-EVALUATION OF ENLARGING AAA BY DR. Trula Slade AND BY DR. Kathlene Cote  MAY 2015 - PT STATES HE UNDERSTANDS THAT NOTHING IS LEAKING AT PRESENT TIME AND HE IS TO FOLLOW UP WITH HIS DOCTORS FOR FUTURE MONITORING I  . Arthritis    LEFT HAND, RT KNEE (08/14/2016)  . Basal cell carcinoma    "right side of my nose; think they burned it off" (08/14/2016)  . CAD (coronary artery disease)    a. s/p PCI to LAD/diagonal, subtotal of PDA 2006. b. Negative nuc 2014.  Marland Kitchen CHF (congestive heart failure) ([place]) 08/14/2016  . Chronic atrial fibrillation ([place])   . CKD (chronic kidney disease), stage III    a. stage III-IV, Cr baseline appears 1.8-2.0  . COPD (chronic obstructive pulmonary disease) ([place])   . Depression   . GERD (gastroesophageal reflux disease)   . History of kidney stones   . History of pneumothorax    LONG TIME AGO  . Hyperlipidemia   . Hypertension   . Migraine    "very rare the last 20 years" (08/14/2016)  . Myocardial infarction ([place]) 2006  . Osteoarthritis   . Renal cell carcinoma ([place])   . Shingles outbreak    PT NOTICED SKIN RASH RT GROIN AND RIGHT TRUNK ON MONDAY 10/27/13 - NOT A LOT OF DISCOMFORT- NOT ON ANY ORAL MEDS TO TREAT.  . Sleep apnea    "dx'd; didn't try mask" (08/14/2016)  . Stroke [facility])    "CT showed I've had old strokes; never knew about it before today" (08/14/2016)     Social History   Social  History  . Marital status: Divorced    Spouse name: N/A  . Number of children: 2  . Years of education: N/A   Occupational History  .  Retired   Social History Main Topics  . Smoking status: Former Smoker    Packs/day: 1.50    Years: 23.00    Types: Cigarettes    Quit date: 08/24/1980  . Smokeless tobacco: Never Used  . Alcohol use 1.5 oz/week    3 Standard drinks or equivalent per week     Comment: 08/14/2016 "maybe 2-3 drinks/month average"  . Drug use: No  . Sexual activity: Not on file   Other Topics Concern  . Not on file   Social History Narrative   Retired   Divorced   2 sons 58 &30     Occupation:  Part time sports broadcaster    Smoking Status:  quit (08/24/1980)   Packs/Day:  1.0    Caffeine use/day:  3 beverages daily    Does Patient Exercise:  no   Alcohol Use - yes    Past Surgical History:  Procedure Laterality Date  . ABDOMINAL AORTAGRAM N/A 09/24/2013   Procedure: ABDOMINAL  Maxcine Ham;  Surgeon: Serafina Mitchell, MD;  Location[facility] CATH LAB;  Service: Cardiovascular;  Laterality: N/A;  . ABDOMINAL AORTIC ANEURYSM REPAIR  JAN 2011   ENDOVASCULAR - DR. BRABHAM  . CARDIAC CATHETERIZATION  07/04/2004   Archie Endo 07/04/2004  . COLONOSCOPY  early 1990s   /notes 11/11/2008  . CORONARY ANGIOPLASTY WITH STENT PLACEMENT  07/05/2004   notes 07/05/2004  . KNEE ARTHROSCOPY Right   . LITHOTRIPSY    . ROBOT ASSISTED LAPAROSCOPIC NEPHRECTOMY Left 11/06/2013   Procedure: ROBOTIC ASSISTED LAPAROSCOPIC RADICAL  NEPHRECTOMY;  Surgeon: Alexis Frock, MD;  Location: [facility] ORS;  Service: Urology;  Laterality: Left;  . TONSILLECTOMY  1964    Family History  Problem Relation Age of Onset  . Pulmonary embolism Mother        died of PE  . Heart disease Mother        before age 74  . Anuerysm Father        history of popliteal  . Coronary artery disease Unknown   . Hyperlipidemia Unknown   . Hypertension Unknown   . Prostate cancer Unknown   . Other Neg Hx        polycystic  kidney disease, and colon cancer  . Colon cancer Neg Hx     Allergies  Allergen Reactions  . Ace Inhibitors     REACTION: Cough    Current Outpatient Prescriptions on File Prior to Visit  Medication Sig Dispense Refill  . albuterol (PROVENTIL HFA;VENTOLIN HFA) 108 (90 Base) MCG/ACT inhaler Inhale 2 puffs into the lungs every 6 (six) hours as needed for wheezing or shortness of breath. 1 Inhaler 0  . atorvastatin (LIPITOR) 40 MG tablet TAKE 1 TABLET ONE TIME DAILY 90 tablet 1  . calcium elemental as carbonate (BARIATRIC TUMS ULTRA) 400 MG chewable tablet     . carvedilol (COREG) 25 MG tablet Take 1 tablet (25 mg total) by mouth 2 (two) times daily with a meal. 60 tablet 0  . Coenzyme Q10 200 MG capsule Take 200 mg by mouth daily.    . feeding supplement, ENSURE ENLIVE, (ENSURE ENLIVE) LIQD Take 237 mLs by mouth daily. 237 mL 12  . furosemide (LASIX) 20 MG tablet Take 3 tablets (60 mg total) by mouth 2 (two) times daily. 540 tablet 1  . hydrALAZINE (APRESOLINE) 10 MG tablet Take 1 tablet (10 mg total) by mouth 3 (three) times daily. 270 tablet 3  . isosorbide dinitrate (ISORDIL) 20 MG tablet Take 1 tablet (20 mg total) by mouth 3 (three) times daily. 270 tablet 1  . Multiple Vitamins-Minerals (MACUVITE EYE CARE PO) Take 1 capsule by mouth daily.    . Omega-3 Fatty Acids (FISH OIL) 1200 MG CAPS Take 1,200 mg by mouth daily.     . potassium chloride SA (K-DUR,KLOR-CON) 20 MEQ tablet Take 1 tablet (20 mEq total) by mouth every evening. 30 tablet 0  . ranitidine (ZANTAC) 150 MG tablet Take 1 tablet (150 mg total) by mouth 2 (two) times daily. 180 tablet 1  . sertraline (ZOLOFT) 100 MG tablet Take 1 tablet (100 mg total) by mouth every evening. 90 tablet 1  . tamsulosin (FLOMAX) 0.4 MG CAPS capsule TAKE 1 CAPSULE (0.4 MG TOTAL) DAILY AFTER BREAKFAST. 90 capsule 1  . traZODone (DESYREL) 50 MG tablet Take 1 tablet (50 mg total) by mouth at bedtime. 30 tablet 0  . warfarin (COUMADIN) 5 MG tablet  TAKE 1 TABLET EVERY DAY EXCEPT 1.5 TABLETS ON WEDNESDAY. 90 tablet 1  No current facility-administered medications on file prior to visit.     BP (!) 130/94 (BP Location: Right Arm, Cuff Size: Normal)   Pulse (!) 126   Temp 97.6 F (36.4 C) (Oral)   Resp 16   Ht 5\' 11"  (1.803 m)   Wt 221 lb (100.2 kg)   SpO2 98% Comment: room air  BMI 30.82 kg/m       Objective:   Physical Exam  Constitutional: He is oriented to person, place, and time. He appears well-developed and well-nourished. No distress.  HENT:  Head: Normocephalic and atraumatic.  Cardiovascular: Normal rate and regular rhythm.   No murmur heard. Pulmonary/Chest: Effort normal and breath sounds normal. No respiratory distress. He has no wheezes. He has no rales.  Neurological: He is alert and oriented to person, place, and time.  Skin: Skin is warm and dry.  Psychiatric: He has a normal mood and affect. His behavior is normal. Thought content normal.          Assessment & Plan:  HTN- BP acceptable.  Continue current meds.  Cough- improving with claritin, continue same.   Chronic Atrial Fibrillation- INR therapeutic. Continue current dosing of coumadin. Repeat INR in 1 month.   We did discuss that as his memory is likely to continue to deteriorate over time he may need placement in a facility.  He is open minded to this when the time comes.

## 2016-09-22 NOTE — Telephone Encounter (Signed)
Pt in office today. States he wants 90 day supply to mail order. Rx sent.

## 2016-09-22 NOTE — Patient Instructions (Addendum)
Please contact [facility] to have them check your portable oxygen tank.  Continue claritin.   Continue current dose of coumadin.

## 2016-09-26 ENCOUNTER — Telehealth (HOSPITAL_COMMUNITY): Payer: Self-pay | Admitting: *Deleted

## 2016-09-26 NOTE — Telephone Encounter (Signed)
Left message on voicemail per DPR in reference to upcoming appointment scheduled on 09/27/16 at 1000 with detailed instructions given per Myocardial Perfusion Study Information Sheet for the test. LM to arrive 15 minutes early, and that it is imperative to arrive on time for appointment to keep from having the test rescheduled. If you need to cancel or reschedule your appointment, please call the office within 24 hours of your appointment. Failure to do so may result in a cancellation of your appointment, and a $50 no show fee. Phone number given for call back for any questions.

## 2016-09-27 ENCOUNTER — Inpatient Hospital Stay: Admission: RE | Admit: 2016-09-27 | Payer: Self-pay | Source: Ambulatory Visit

## 2016-09-27 ENCOUNTER — Encounter: Payer: Self-pay | Admitting: *Deleted

## 2016-09-27 ENCOUNTER — Encounter (HOSPITAL_COMMUNITY): Payer: Self-pay

## 2016-09-27 NOTE — Progress Notes (Signed)
Patient ID: Mark [name], male   DOB: December 11, 1940, 76 y.o.   MRN: [id number] Patient did not show up for 09/27/16, 12:30 PM, appointment to have a 24 hour holter monitor applied.

## 2016-09-29 ENCOUNTER — Ambulatory Visit: Payer: Self-pay | Admitting: Family

## 2016-10-06 ENCOUNTER — Observation Stay (HOSPITAL_COMMUNITY): Payer: Medicare HMO

## 2016-10-06 ENCOUNTER — Encounter (HOSPITAL_COMMUNITY): Payer: Self-pay | Admitting: Emergency Medicine

## 2016-10-06 ENCOUNTER — Telehealth: Payer: Self-pay | Admitting: Family

## 2016-10-06 ENCOUNTER — Observation Stay (HOSPITAL_COMMUNITY)
Admission: EM | Admit: 2016-10-06 | Discharge: 2016-10-07 | Disposition: A | Discharge status: Home / Self Care | Payer: Medicare HMO | Attending: Internal Medicine | Admitting: Internal Medicine

## 2016-10-06 ENCOUNTER — Emergency Department (HOSPITAL_COMMUNITY): Payer: Medicare HMO

## 2016-10-06 DIAGNOSIS — I482 Chronic atrial fibrillation, unspecified: Secondary | ICD-10-CM | POA: Diagnosis present

## 2016-10-06 DIAGNOSIS — N183 Chronic kidney disease, stage 3 (moderate): Secondary | ICD-10-CM | POA: Diagnosis not present

## 2016-10-06 DIAGNOSIS — I4891 Unspecified atrial fibrillation: Secondary | ICD-10-CM

## 2016-10-06 DIAGNOSIS — I110 Hypertensive heart disease with heart failure: Secondary | ICD-10-CM | POA: Insufficient documentation

## 2016-10-06 DIAGNOSIS — Z85528 Personal history of other malignant neoplasm of kidney: Secondary | ICD-10-CM | POA: Diagnosis not present

## 2016-10-06 DIAGNOSIS — R0902 Hypoxemia: Secondary | ICD-10-CM | POA: Diagnosis not present

## 2016-10-06 DIAGNOSIS — I130 Hypertensive heart and chronic kidney disease with heart failure and stage 1 through stage 4 chronic kidney disease, or unspecified chronic kidney disease: Secondary | ICD-10-CM | POA: Diagnosis not present

## 2016-10-06 DIAGNOSIS — I252 Old myocardial infarction: Secondary | ICD-10-CM | POA: Diagnosis not present

## 2016-10-06 DIAGNOSIS — F05 Delirium due to known physiological condition: Secondary | ICD-10-CM

## 2016-10-06 DIAGNOSIS — Z8673 Personal history of transient ischemic attack (TIA), and cerebral infarction without residual deficits: Secondary | ICD-10-CM | POA: Insufficient documentation

## 2016-10-06 DIAGNOSIS — F329 Major depressive disorder, single episode, unspecified: Secondary | ICD-10-CM | POA: Diagnosis not present

## 2016-10-06 DIAGNOSIS — Z8679 Personal history of other diseases of the circulatory system: Secondary | ICD-10-CM | POA: Insufficient documentation

## 2016-10-06 DIAGNOSIS — Z85828 Personal history of other malignant neoplasm of skin: Secondary | ICD-10-CM | POA: Diagnosis not present

## 2016-10-06 DIAGNOSIS — R4781 Slurred speech: Secondary | ICD-10-CM | POA: Diagnosis not present

## 2016-10-06 DIAGNOSIS — N400 Enlarged prostate without lower urinary tract symptoms: Secondary | ICD-10-CM | POA: Insufficient documentation

## 2016-10-06 DIAGNOSIS — G9340 Encephalopathy, unspecified: Secondary | ICD-10-CM | POA: Diagnosis not present

## 2016-10-06 DIAGNOSIS — N179 Acute kidney failure, unspecified: Secondary | ICD-10-CM | POA: Diagnosis not present

## 2016-10-06 DIAGNOSIS — J449 Chronic obstructive pulmonary disease, unspecified: Secondary | ICD-10-CM | POA: Diagnosis not present

## 2016-10-06 DIAGNOSIS — F0390 Unspecified dementia without behavioral disturbance: Secondary | ICD-10-CM | POA: Diagnosis not present

## 2016-10-06 DIAGNOSIS — I5023 Acute on chronic systolic (congestive) heart failure: Secondary | ICD-10-CM | POA: Insufficient documentation

## 2016-10-06 DIAGNOSIS — Z7901 Long term (current) use of anticoagulants: Secondary | ICD-10-CM | POA: Diagnosis not present

## 2016-10-06 DIAGNOSIS — Z955 Presence of coronary angioplasty implant and graft: Secondary | ICD-10-CM | POA: Insufficient documentation

## 2016-10-06 DIAGNOSIS — Z79899 Other long term (current) drug therapy: Secondary | ICD-10-CM | POA: Insufficient documentation

## 2016-10-06 DIAGNOSIS — I739 Peripheral vascular disease, unspecified: Secondary | ICD-10-CM | POA: Insufficient documentation

## 2016-10-06 DIAGNOSIS — I2510 Atherosclerotic heart disease of native coronary artery without angina pectoris: Secondary | ICD-10-CM | POA: Diagnosis not present

## 2016-10-06 DIAGNOSIS — I639 Cerebral infarction, unspecified: Secondary | ICD-10-CM

## 2016-10-06 DIAGNOSIS — R05 Cough: Secondary | ICD-10-CM | POA: Diagnosis not present

## 2016-10-06 DIAGNOSIS — Z87891 Personal history of nicotine dependence: Secondary | ICD-10-CM | POA: Diagnosis not present

## 2016-10-06 DIAGNOSIS — E785 Hyperlipidemia, unspecified: Secondary | ICD-10-CM | POA: Diagnosis not present

## 2016-10-06 DIAGNOSIS — K219 Gastro-esophageal reflux disease without esophagitis: Secondary | ICD-10-CM | POA: Insufficient documentation

## 2016-10-06 DIAGNOSIS — R4182 Altered mental status, unspecified: Secondary | ICD-10-CM

## 2016-10-06 DIAGNOSIS — R410 Disorientation, unspecified: Secondary | ICD-10-CM | POA: Diagnosis not present

## 2016-10-06 DIAGNOSIS — I714 Abdominal aortic aneurysm, without rupture: Secondary | ICD-10-CM | POA: Diagnosis not present

## 2016-10-06 DIAGNOSIS — Z905 Acquired absence of kidney: Secondary | ICD-10-CM | POA: Insufficient documentation

## 2016-10-06 DIAGNOSIS — I447 Left bundle-branch block, unspecified: Secondary | ICD-10-CM | POA: Insufficient documentation

## 2016-10-06 DIAGNOSIS — R079 Chest pain, unspecified: Secondary | ICD-10-CM | POA: Diagnosis not present

## 2016-10-06 LAB — COMPREHENSIVE METABOLIC PANEL
ALBUMIN: 3.3 g/dL — AB (ref 3.5–5.0)
ALT: 74 U/L — AB (ref 17–63)
AST: 37 U/L (ref 15–41)
Alkaline Phosphatase: 67 U/L (ref 38–126)
Anion gap: 12 (ref 5–15)
BUN: 60 mg/dL — ABNORMAL HIGH (ref 6–20)
CO2: 23 mmol/L (ref 22–32)
CREATININE: 3.08 mg/dL — AB (ref 0.61–1.24)
Calcium: 8.7 mg/dL — ABNORMAL LOW (ref 8.9–10.3)
Chloride: 105 mmol/L (ref 101–111)
GFR calc Af Amer: 22 mL/min — ABNORMAL LOW (ref >60)
GFR calc non Af Amer: 19 mL/min — ABNORMAL LOW (ref >60)
GLUCOSE: 108 mg/dL — AB (ref 65–99)
Potassium: 3.5 mmol/L (ref 3.5–5.1)
SODIUM: 140 mmol/L (ref 135–145)
Total Bilirubin: 1.9 mg/dL — ABNORMAL HIGH (ref 0.3–1.2)
Total Protein: 6.1 g/dL — ABNORMAL LOW (ref 6.5–8.1)

## 2016-10-06 LAB — CBC
HCT: 36.4 % — ABNORMAL LOW (ref 39.0–52.0)
HEMOGLOBIN: 11.6 g/dL — AB (ref 13.0–17.0)
MCH: 29.9 pg (ref 26.0–34.0)
MCHC: 31.9 g/dL (ref 30.0–36.0)
MCV: 93.8 fL (ref 78.0–100.0)
PLATELETS: 121 10*3/uL — AB (ref 150–400)
RBC: 3.88 MIL/uL — ABNORMAL LOW (ref 4.22–5.81)
RDW: 17.4 % — ABNORMAL HIGH (ref 11.5–15.5)
WBC: 6 10*3/uL (ref 4.0–10.5)

## 2016-10-06 LAB — URINALYSIS, ROUTINE W REFLEX MICROSCOPIC
BACTERIA UA: NONE SEEN
BILIRUBIN URINE: NEGATIVE
GLUCOSE, UA: NEGATIVE mg/dL
Hgb urine dipstick: NEGATIVE
KETONES UR: NEGATIVE mg/dL
NITRITE: NEGATIVE
PROTEIN: 30 mg/dL — AB
Specific Gravity, Urine: 1.013 (ref 1.005–1.030)
pH: 5 (ref 5.0–8.0)

## 2016-10-06 LAB — RAPID URINE DRUG SCREEN, HOSP PERFORMED
Amphetamines: NOT DETECTED
BARBITURATES: NOT DETECTED
BENZODIAZEPINES: NOT DETECTED
Cocaine: NOT DETECTED
Opiates: NOT DETECTED
Tetrahydrocannabinol: NOT DETECTED

## 2016-10-06 LAB — CBG MONITORING, ED: GLUCOSE-CAPILLARY: 109 mg/dL — AB (ref 65–99)

## 2016-10-06 LAB — MAGNESIUM: MAGNESIUM: 2.3 mg/dL (ref 1.7–2.4)

## 2016-10-06 LAB — PROTIME-INR
INR: 1.94
Prothrombin Time: 22.5 seconds — ABNORMAL HIGH (ref 11.4–15.2)

## 2016-10-06 LAB — ETHANOL: Alcohol, Ethyl (B): <5 mg/dL (ref <5)

## 2016-10-06 MED ORDER — DILTIAZEM HCL 30 MG PO TABS
30.0000 mg | ORAL_TABLET | Freq: Four times a day (QID) | ORAL | Status: DC
  Administered 2016-10-06 – 2016-10-07 (×5): 30 mg via ORAL
  Filled 2016-10-06 (×5): qty 1

## 2016-10-06 MED ORDER — METOPROLOL TARTRATE 5 MG/5ML IV SOLN: 5.0000 mg | Freq: Four times a day (QID) | INTRAVENOUS | Status: DC | PRN

## 2016-10-06 MED ORDER — TRAZODONE HCL 50 MG PO TABS
50.0000 mg | ORAL_TABLET | Freq: Every day | ORAL | Status: DC
  Administered 2016-10-06: 50 mg via ORAL
  Filled 2016-10-06: qty 1

## 2016-10-06 MED ORDER — WARFARIN - PHARMACIST DOSING INPATIENT: Freq: Every day | Status: DC

## 2016-10-06 MED ORDER — CARVEDILOL 25 MG PO TABS: 25.0000 mg | ORAL_TABLET | Freq: Two times a day (BID) | ORAL | Status: DC

## 2016-10-06 MED ORDER — SODIUM CHLORIDE 0.9 % IV BOLUS (SEPSIS)
500.0000 mL | Freq: Once | INTRAVENOUS | Status: AC
  Administered 2016-10-06: 500 mL via INTRAVENOUS

## 2016-10-06 MED ORDER — ISOSORBIDE DINITRATE 20 MG PO TABS
20.0000 mg | ORAL_TABLET | Freq: Three times a day (TID) | ORAL | Status: DC
  Administered 2016-10-06 – 2016-10-07 (×3): 20 mg via ORAL
  Filled 2016-10-06 (×5): qty 1

## 2016-10-06 MED ORDER — SODIUM CHLORIDE 0.9% FLUSH
3.0000 mL | Freq: Two times a day (BID) | INTRAVENOUS | Status: DC
  Administered 2016-10-06: 3 mL via INTRAVENOUS

## 2016-10-06 MED ORDER — BISACODYL 5 MG PO TBEC: 5.0000 mg | DELAYED_RELEASE_TABLET | Freq: Every day | ORAL | Status: DC | PRN

## 2016-10-06 MED ORDER — FAMOTIDINE 20 MG PO TABS
20.0000 mg | ORAL_TABLET | Freq: Two times a day (BID) | ORAL | Status: DC
  Administered 2016-10-06 – 2016-10-07 (×2): 20 mg via ORAL
  Filled 2016-10-06 (×2): qty 1

## 2016-10-06 MED ORDER — STROKE: EARLY STAGES OF RECOVERY BOOK
Freq: Once | Status: DC
  Filled 2016-10-06: qty 1

## 2016-10-06 MED ORDER — ACETAMINOPHEN 650 MG RE SUPP: 650.0000 mg | Freq: Four times a day (QID) | RECTAL | Status: DC | PRN

## 2016-10-06 MED ORDER — CARVEDILOL 25 MG PO TABS
25.0000 mg | ORAL_TABLET | Freq: Two times a day (BID) | ORAL | Status: DC
  Administered 2016-10-06 – 2016-10-07 (×3): 25 mg via ORAL
  Filled 2016-10-06 (×3): qty 1

## 2016-10-06 MED ORDER — TAMSULOSIN HCL 0.4 MG PO CAPS
0.4000 mg | ORAL_CAPSULE | Freq: Every day | ORAL | Status: DC
  Administered 2016-10-07: 0.4 mg via ORAL
  Filled 2016-10-06: qty 1

## 2016-10-06 MED ORDER — POLYETHYLENE GLYCOL 3350 17 G PO PACK: 17.0000 g | PACK | Freq: Every day | ORAL | Status: DC | PRN

## 2016-10-06 MED ORDER — METOPROLOL TARTRATE 5 MG/5ML IV SOLN
5.0000 mg | Freq: Once | INTRAVENOUS | Status: AC
  Administered 2016-10-06: 5 mg via INTRAVENOUS
  Filled 2016-10-06: qty 5

## 2016-10-06 MED ORDER — ALBUTEROL SULFATE (2.5 MG/3ML) 0.083% IN NEBU: 3.0000 mL | INHALATION_SOLUTION | Freq: Four times a day (QID) | RESPIRATORY_TRACT | Status: DC | PRN

## 2016-10-06 MED ORDER — SODIUM CHLORIDE 0.9 % IV SOLN
INTRAVENOUS | Status: DC
  Administered 2016-10-06: 19:00:00 via INTRAVENOUS

## 2016-10-06 MED ORDER — WARFARIN SODIUM 5 MG PO TABS
5.0000 mg | ORAL_TABLET | Freq: Once | ORAL | Status: AC
  Administered 2016-10-06: 5 mg via ORAL
  Filled 2016-10-06: qty 1

## 2016-10-06 MED ORDER — ENSURE ENLIVE PO LIQD
237.0000 mL | ORAL | Status: DC
  Administered 2016-10-06 – 2016-10-07 (×2): 237 mL via ORAL

## 2016-10-06 MED ORDER — ATORVASTATIN CALCIUM 40 MG PO TABS
40.0000 mg | ORAL_TABLET | Freq: Every day | ORAL | Status: DC
  Administered 2016-10-06 – 2016-10-07 (×2): 40 mg via ORAL
  Filled 2016-10-06 (×2): qty 1

## 2016-10-06 MED ORDER — SERTRALINE HCL 100 MG PO TABS
100.0000 mg | ORAL_TABLET | Freq: Every evening | ORAL | Status: DC
  Administered 2016-10-06 – 2016-10-07 (×2): 100 mg via ORAL
  Filled 2016-10-06 (×2): qty 1

## 2016-10-06 MED ORDER — ACETAMINOPHEN 325 MG PO TABS: 650.0000 mg | ORAL_TABLET | Freq: Four times a day (QID) | ORAL | Status: DC | PRN

## 2016-10-06 NOTE — Progress Notes (Signed)
MD via phone updated via phone regarding sustained heart rate of 145-155. MD to place new orders

## 2016-10-06 NOTE — ED Notes (Signed)
Bed: QH47 Expected date:  Expected time:  Means of arrival:  Comments: 76 yo M  AMS

## 2016-10-06 NOTE — ED Triage Notes (Addendum)
Per EMS neighbor found pt outside of his apartment complex sleeping. [place] but does not remember getting home. Initially with EMS pt slurred speech that has since resolved; neurological unremarkable. Pt alert and oriented per normal. Pt found without oxygen with room air saturation 89%; normally on 3 lpm [place]. Pt Afib; hx of same. Pt denies pain.

## 2016-10-06 NOTE — Progress Notes (Signed)
ANTICOAGULATION CONSULT NOTE - Initial Consult  Pharmacy Consult for warfarin Indication: atrial fibrillation  Allergies  Allergen Reactions  . Ace Inhibitors Cough    Patient Measurements: Height: 5\' 11"  (180.3 cm) Weight: 221 lb (100.2 kg) IBW/kg (Calculated) : 75.3 Heparin Dosing Weight:   Vital Signs: Temp: 97.9 F (36.6 C) (06/01 1745) Temp Source: Oral (06/01 1745) BP: 125/84 (06/01 1855) Pulse Rate: 138 (06/01 1745)  Labs:  Recent Labs  10/06/16 0745  HGB 11.6*  HCT 36.4*  PLT 121*  LABPROT 22.5*  INR 1.94  CREATININE 3.08*    Estimated Creatinine Clearance: 25.8 mL/min (A) (by C-G formula based on SCr of 3.08 mg/dL (H)).   Medical History: Past Medical History:  Diagnosis Date  . AAA (abdominal aortic aneurysm) (HCC)    9.1 cm repair 05/2009 ( ENDOVASCULAR REPAIR ).  MORE RECENT RE-EVALUATION OF ENLARGING AAA BY DR. Trula Slade AND BY DR. Kathlene Cote  MAY 2015 - PT STATES HE UNDERSTANDS THAT NOTHING IS LEAKING AT PRESENT TIME AND HE IS TO FOLLOW UP WITH HIS DOCTORS FOR FUTURE MONITORING I  . Arthritis    LEFT HAND, RT KNEE (08/14/2016)  . Basal cell carcinoma    "right side of my nose; think they burned it off" (08/14/2016)  . CAD (coronary artery disease)    a. s/p PCI to LAD/diagonal, subtotal of PDA 2006. b. Negative nuc 2014.  Marland Kitchen CHF (congestive heart failure) ([place]) 08/14/2016  . Chronic atrial fibrillation ([place])   . CKD (chronic kidney disease), stage III    a. stage III-IV, Cr baseline appears 1.8-2.0  . COPD (chronic obstructive pulmonary disease) ([place])   . Depression   . GERD (gastroesophageal reflux disease)   . History of kidney stones   . History of pneumothorax    LONG TIME AGO  . Hyperlipidemia   . Hypertension   . Migraine    "very rare the last 20 years" (08/14/2016)  . Myocardial infarction ([place]) 2006  . Osteoarthritis   . Renal cell carcinoma ([place])   . Shingles outbreak    PT NOTICED SKIN RASH RT GROIN AND RIGHT TRUNK ON MONDAY 10/27/13 -  NOT A LOT OF DISCOMFORT- NOT ON ANY ORAL MEDS TO TREAT.  . Sleep apnea    "dx'd; didn't try mask" (08/14/2016)  . Stroke [facility])    "CT showed I've had old strokes; never knew about it before today" (08/14/2016)    Medications:  Scheduled:  . atorvastatin  40 mg Oral Daily  . carvedilol  25 mg Oral BID WC  . diltiazem  30 mg Oral Q6H  . famotidine  20 mg Oral BID  . feeding supplement (ENSURE ENLIVE)  237 mL Oral Q24H  . isosorbide dinitrate  20 mg Oral TID  . sertraline  100 mg Oral QPM  . sodium chloride flush  3 mL Intravenous Q12H  . [START ON 10/07/2016] tamsulosin  0.4 mg Oral QPC breakfast  . traZODone  50 mg Oral QHS  . warfarin  5 mg Oral ONCE-1800  . Warfarin - Pharmacist Dosing Inpatient   Does not apply q1800    Assessment: Pharmacy is consulted to dose warfarin in 76 yo male with PMH of atrial fibrillation. Pt was admitted due to acute encephalopathy.Pt last seen at warfarin clinic on 5/218 and was on the regimen of warfarin 5 mg PO daily except 7.5 mg PO on wednesdays.   Labs on admission:   INR 1.94  Hgb 11.6, plt 121    Goal of Therapy:  INR 2-3 Monitor platelets by anticoagulation protocol: Yes   Plan:   Warfarin 5 mg PO x1   Daily INR  Monitor for signs and symptoms of bleeding    Royetta Asal, PharmD, BCPS Pager [phone] 10/06/2016 7:03 PM

## 2016-10-06 NOTE — ED Notes (Signed)
Pt does not need stroke swallow screen per Dr. [name].  May feed patient.

## 2016-10-06 NOTE — ED Notes (Signed)
Patient in MRI 

## 2016-10-06 NOTE — Progress Notes (Signed)
Report given to Night RN. Medications given per MD Orders Tele Monitor A Fib rate has decreased down to 120-130s. BP 132/64. Maintain current plan of care for Pt.

## 2016-10-06 NOTE — Progress Notes (Signed)
PATIENT HAS HIS MEDS FROM HOME THAT NEED TO BE LOCKED UP

## 2016-10-06 NOTE — ED Provider Notes (Signed)
[facility] DEPT Provider Note   CSN: [id number] Arrival date & time: 10/06/16  0715     History   Chief Complaint Chief Complaint  Patient presents with  . Altered Mental Status    HPI Mark [name] is a 76 y.o. male.  Patient is a 76 year old male who presents with altered mental status. He has a history of coronary artery disease, COPD, chronic kidney disease, CHF, chronic atrial fibrillation on Coumadin and prior AAA repair in 2011.  He presents because he had an episode this morning where he appeared to be confused. He states that he went to a festival last night [place]. He states he made a mistake and didn't go with the viscera that he was supposed to go with. He was attempting to find this person to go home but wasn't able to. He then walked home. He states that he was at his apartment complex during the night but wasn't quite sure how he got there. He states he was locked out of his apartment at that point and slept intermittently at the apartment complex.  He was found by a neighbor and EMS was called. Per EMS he did have some slurred speech on arrival but that has resolved. He states he normally is on oxygen at 3 L but doesn't wear it all the time although he is supposed to. His room air sat was 89% by EMS. He states that he has had some things like this happen to him in the past where he doesn't remember certain periods of time. He denies any falls. He denies any recent illnesses. He has a little bit of a cough but no fevers. No shortness of breath. No chest pain. He denies any alcohol or drug use.      Past Medical History:  Diagnosis Date  . AAA (abdominal aortic aneurysm) (HCC)    9.1 cm repair 05/2009 ( ENDOVASCULAR REPAIR ).  MORE RECENT RE-EVALUATION OF ENLARGING AAA BY DR. Trula Slade AND BY DR. Kathlene Cote  MAY 2015 - PT STATES HE UNDERSTANDS THAT NOTHING IS LEAKING AT PRESENT TIME AND HE IS TO FOLLOW UP WITH HIS DOCTORS FOR FUTURE MONITORING I  . Arthritis    LEFT HAND,  RT KNEE (08/14/2016)  . Basal cell carcinoma    "right side of my nose; think they burned it off" (08/14/2016)  . CAD (coronary artery disease)    a. s/p PCI to LAD/diagonal, subtotal of PDA 2006. b. Negative nuc 2014.  Marland Kitchen CHF (congestive heart failure) ([place]) 08/14/2016  . Chronic atrial fibrillation ([place])   . CKD (chronic kidney disease), stage III    a. stage III-IV, Cr baseline appears 1.8-2.0  . COPD (chronic obstructive pulmonary disease) ([place])   . Depression   . GERD (gastroesophageal reflux disease)   . History of kidney stones   . History of pneumothorax    LONG TIME AGO  . Hyperlipidemia   . Hypertension   . Migraine    "very rare the last 20 years" (08/14/2016)  . Myocardial infarction ([place]) 2006  . Osteoarthritis   . Renal cell carcinoma ([place])   . Shingles outbreak    PT NOTICED SKIN RASH RT GROIN AND RIGHT TRUNK ON MONDAY 10/27/13 - NOT A LOT OF DISCOMFORT- NOT ON ANY ORAL MEDS TO TREAT.  . Sleep apnea    "dx'd; didn't try mask" (08/14/2016)  . Stroke [facility])    "CT showed I've had old strokes; never knew about it before today" (08/14/2016)    Patient  Active Problem List   Diagnosis Date Noted  . Alzheimer's dementia with behavioral disturbance   . Hallucinations   . LBBB (left bundle branch block)   . Acute systolic CHF (congestive heart failure) ([place]) 08/16/2016  . Acute encephalopathy 08/14/2016  . Acute on chronic respiratory failure with hypoxia ([place]) 08/14/2016  . Asthma 03/24/2016  . Preventative health care 09/22/2015  . Osteopenia 09/07/2014  . Osteoarthritis, hand 04/09/2014  . Normocytic anemia 04/09/2014  . OSA (obstructive sleep apnea) 01/08/2014  . Daytime somnolence 11/20/2013  . Renal cell carcinoma ([place]) 11/06/2013  . AAA (abdominal aortic aneurysm) without rupture ([place]) 09/22/2013  . Atrial fibrillation, chronic ([place]) 07/25/2012  . 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 BPH (benign prostatic hyperplasia) 12/21/2009  . Abdominal  aortic aneurysm ([place]) 05/28/2009  . Disorder resulting from impaired renal function 05/28/2009  . Hyperlipidemia 11/11/2008  . Depression 11/11/2008  . Essential hypertension 11/11/2008  . Coronary atherosclerosis 11/11/2008  . GERD 11/11/2008  . SKIN CANCER, HX OF 11/11/2008  . NEPHROLITHIASIS, HX OF 11/11/2008    Past Surgical History:  Procedure Laterality Date  . ABDOMINAL AORTAGRAM N/A 09/24/2013   Procedure: ABDOMINAL Maxcine Ham;  Surgeon: Serafina Mitchell, MD;  Location: [facility] CATH LAB;  Service: Cardiovascular;  Laterality: N/A;  . ABDOMINAL AORTIC ANEURYSM REPAIR  JAN 2011   ENDOVASCULAR - DR. BRABHAM  . CARDIAC CATHETERIZATION  07/04/2004   Archie Endo 07/04/2004  . COLONOSCOPY  early 1990s   /notes 11/11/2008  . CORONARY ANGIOPLASTY WITH STENT PLACEMENT  07/05/2004   notes 07/05/2004  . KNEE ARTHROSCOPY Right   . LITHOTRIPSY    . ROBOT ASSISTED LAPAROSCOPIC NEPHRECTOMY Left 11/06/2013   Procedure: ROBOTIC ASSISTED LAPAROSCOPIC RADICAL  NEPHRECTOMY;  Surgeon: Alexis Frock, MD;  Location: [facility] ORS;  Service: Urology;  Laterality: Left;  . TONSILLECTOMY  1964       Home Medications    Prior to Admission medications   Medication Sig Start Date End Date Taking? Authorizing Provider  acetaminophen (TYLENOL) 500 MG tablet Take 1,000 mg by mouth every 6 (six) hours as needed (For pain.).   Yes [provider]  albuterol (PROVENTIL HFA;VENTOLIN HFA) 108 (90 Base) MCG/ACT inhaler Inhale 2 puffs into the lungs every 6 (six) hours as needed for wheezing or shortness of breath. 01/26/16  Yes Shelda Pal, DO  atorvastatin (LIPITOR) 40 MG tablet TAKE 1 TABLET ONE TIME DAILY Patient taking differently: Take 40 mg by mouth daily. TAKE 1 TABLET ONE TIME DAILY 04/06/16  Yes [name], NP  carvedilol (COREG) 25 MG tablet Take 1 tablet (25 mg total) by mouth 2 (two) times daily with a meal. 08/22/16  Yes Lavina Hamman, MD  Coenzyme Q10 200 MG capsule Take 200 mg by mouth  daily.   Yes [provider]  feeding supplement, ENSURE ENLIVE, (ENSURE ENLIVE) LIQD Take 237 mLs by mouth daily. 08/22/16  Yes Lavina Hamman, MD  furosemide (LASIX) 20 MG tablet Take 3 tablets (60 mg total) by mouth 2 (two) times daily. 09/01/16  Yes [name], NP  hydrALAZINE (APRESOLINE) 10 MG tablet Take 1 tablet (10 mg total) by mouth 3 (three) times daily. 09/20/16  Yes Lelon Perla, MD  hydrocortisone cream 1 % Apply 1 application topically as needed for itching (For rash.).   Yes [provider]  isosorbide dinitrate (ISORDIL) 20 MG tablet Take 1 tablet (20 mg total) by mouth 3 (three) times daily. 04/06/16  Yes [name], NP  Multiple  Vitamins-Minerals (MACUVITE EYE CARE PO) Take 1 capsule by mouth daily. 05/21/15  Yes [provider]  Omega-3 Fatty Acids (FISH OIL) 1200 MG CAPS Take 1,200 mg by mouth daily.    Yes [provider]  potassium chloride SA (K-DUR,KLOR-CON) 20 MEQ tablet Take 1 tablet (20 mEq total) by mouth every evening. 08/22/16  Yes Lavina Hamman, MD  ranitidine (ZANTAC) 150 MG tablet Take 1 tablet (150 mg total) by mouth 2 (two) times daily. 04/06/16  Yes [name], NP  sertraline (ZOLOFT) 100 MG tablet Take 1 tablet (100 mg total) by mouth every evening. 04/06/16  Yes [name], NP  tamsulosin (FLOMAX) 0.4 MG CAPS capsule TAKE 1 CAPSULE (0.4 MG TOTAL) DAILY AFTER BREAKFAST. Patient taking differently: Take 0.4 mg by mouth daily after breakfast.  09/22/16  Yes [name], NP  traZODone (DESYREL) 50 MG tablet Take 1 tablet (50 mg total) by mouth at bedtime. Patient taking differently: Take 50 mg by mouth at bedtime as needed for sleep.  08/22/16  Yes Lavina Hamman, MD  warfarin (COUMADIN) 5 MG tablet TAKE 1 TABLET EVERY DAY EXCEPT 1.5 TABLETS ON WEDNESDAY. Patient taking differently: Take 5-7.5 mg by mouth See admin instructions. Take one tablet every day except for on Wednesday. Take  one and a half tablets on that day. 06/02/16  Yes [name], NP    Family History Family History  Problem Relation Age of Onset  . Pulmonary embolism Mother        died of PE  . Heart disease Mother        before age 31  . Anuerysm Father        history of popliteal  . Coronary artery disease Unknown   . Hyperlipidemia Unknown   . Hypertension Unknown   . Prostate cancer Unknown   . Other Neg Hx        polycystic kidney disease, and colon cancer  . Colon cancer Neg Hx     Social History Social History  Substance Use Topics  . Smoking status: Former Smoker    Packs/day: 1.50    Years: 23.00    Types: Cigarettes    Quit date: 08/24/1980  . Smokeless tobacco: Never Used  . Alcohol use 1.5 oz/week    3 Standard drinks or equivalent per week     Comment: 08/14/2016 "maybe 2-3 drinks/month average"     Allergies   Ace inhibitors   Review of Systems Review of Systems  Constitutional: Negative for chills, diaphoresis, fatigue and fever.  HENT: Negative for congestion, rhinorrhea and sneezing.   Eyes: Negative.   Respiratory: Positive for cough. Negative for chest tightness and shortness of breath.   Cardiovascular: Negative for chest pain and leg swelling.  Gastrointestinal: Negative for abdominal pain, blood in stool, diarrhea, nausea and vomiting.  Genitourinary: Negative for difficulty urinating, flank pain, frequency and hematuria.  Musculoskeletal: Negative for arthralgias and back pain.  Skin: Negative for rash.  Neurological: Negative for dizziness, speech difficulty, weakness, numbness and headaches.  Psychiatric/Behavioral: Positive for confusion.     Physical Exam Updated Vital Signs BP (!) 127/106 (BP Location: Left Arm)   Pulse 100   Temp 97.7 F (36.5 C) (Oral)   Resp 18   Ht 5\' 11"  (1.803 m)   Wt 100.2 kg (221 lb)   SpO2 92%   BMI 30.82 kg/m   Physical Exam  Constitutional: He is oriented to person, place, and time. He appears  well-developed and well-nourished.  HENT:  Head: Normocephalic and atraumatic.  Eyes: Pupils are equal, round, and reactive to light.  Neck: Normal range of motion. Neck supple.  Cardiovascular: Normal rate, regular rhythm and normal heart sounds.   Pulmonary/Chest: Effort normal and breath sounds normal. No respiratory distress. He has no wheezes. He has no rales. He exhibits no tenderness.  Abdominal: Soft. Bowel sounds are normal. There is no tenderness. There is no rebound and no guarding.  Musculoskeletal: Normal range of motion. He exhibits no edema.  Lymphadenopathy:    He has no cervical adenopathy.  Neurological: He is alert and oriented to person, place, and time.  Patient is oriented to person place and month/year. He did not know the day of the week. Motor 5 out of 5 all extremities, sensation grossly intact to light touch all extremities, cranial nerves II through XII grossly intact, no pronator drift, finger-nose intact  Skin: Skin is warm and dry. No rash noted.  Psychiatric: He has a normal mood and affect.     ED Treatments / Results  Labs (all labs ordered are listed, but only abnormal results are displayed) Labs Reviewed  COMPREHENSIVE METABOLIC PANEL - Abnormal; Notable for the following:       Result Value   Glucose, Bld 108 (*)    BUN 60 (*)    Creatinine, Ser 3.08 (*)    Calcium 8.7 (*)    Total Protein 6.1 (*)    Albumin 3.3 (*)    ALT 74 (*)    Total Bilirubin 1.9 (*)    GFR calc non Af Amer 19 (*)    GFR calc Af Amer 22 (*)    All other components within normal limits  CBC - Abnormal; Notable for the following:    RBC 3.88 (*)    Hemoglobin 11.6 (*)    HCT 36.4 (*)    RDW 17.4 (*)    Platelets 121 (*)    All other components within normal limits  PROTIME-INR - Abnormal; Notable for the following:    Prothrombin Time 22.5 (*)    All other components within normal limits  URINALYSIS, ROUTINE W REFLEX MICROSCOPIC - Abnormal; Notable for the  following:    Protein, ur 30 (*)    Leukocytes, UA TRACE (*)    Squamous Epithelial / LPF 0-5 (*)    All other components within normal limits  CBG MONITORING, ED - Abnormal; Notable for the following:    Glucose-Capillary 109 (*)    All other components within normal limits  ETHANOL    EKG  EKG Interpretation  Date/Time:  Friday October 06 2016 07:34:35 EDT Ventricular Rate:  121 PR Interval:    QRS Duration: 182 QT Interval:  367 QTC Calculation: 521 R Axis:   -89 Text Interpretation:  Atrial fibrillation Nonspecific IVCD with LAD LVH with secondary repolarization abnormality Inferior infarct, old Anterior infarct, old since last tracing no significant change Confirmed by Malvin Johns [phone]) on 10/06/2016 7:43:53 AM       Radiology Dg Chest 2 View  Result Date: 10/06/2016 CLINICAL DATA:  Cough and congestion for 2 weeks.  Chest pain. EXAM: CHEST  2 VIEW COMPARISON:  Sep 15, 2016 and September 01, 2016 FINDINGS: There is stable interstitial prominence, most notably in the left upper lobe and both bases, likely due to underlying fibrotic type change. There is no appreciable edema or consolidation. Heart is borderline prominent with pulmonary vascularity within normal limits. No evident adenopathy. There is degenerative change in the  thoracic spine with diffuse idiopathic skeletal hyperostosis. IMPRESSION: Areas of underlying scarring/ fibrotic change without frank edema or consolidation. Stable cardiac prominence. 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 No evident adenopathy. There is no appreciable change compared to recent studies. Electronically Signed   By: Lowella Grip III M.D.   On: 10/06/2016 08:00   Ct Head Wo Contrast  Result Date: 10/06/2016 CLINICAL DATA:  Slurred speech and altered mental status. Short-term memory loss. EXAM: CT HEAD WITHOUT CONTRAST TECHNIQUE: Contiguous axial images were obtained from the base of the skull through the  vertex without intravenous contrast. COMPARISON:  August 14, 2016 head CT and brain MRI August 14, 2016 FINDINGS: Brain: There is age related volume loss. There is no intracranial mass, hemorrhage, extra-axial fluid collection, or midline shift. There is small vessel disease throughout much of the centra semiovale bilaterally, stable. There is small vessel disease in each caudate nucleus region as well as in the anterior limbs of each internal capsule, stable. There is no new gray-white compartment lesion. No acute infarct evident. Vascular: No hyperdense vessel. There is calcification in each carotid siphon region. Skull: Bony calvarium appears intact. Sinuses/Orbits: There is a a retention cyst, incompletely visualized, in the anterior left maxillary antrum with associated mucosal thickening. There is mucosal thickening in several ethmoid air cells bilaterally. Frontal sinuses are hypoplastic. Visualized paranasal sinuses elsewhere clear. Orbits appear symmetric bilaterally. Other: Mastoid air cells are clear. IMPRESSION: Fairly extensive supratentorial small vessel disease remain stable. There is age related volume loss. No intracranial mass, hemorrhage, or extra-axial fluid collection. No acute appearing infarct. Areas of vascular calcification noted. Areas of paranasal sinus disease as summarized above. Electronically Signed   By: Lowella Grip III M.D.   On: 10/06/2016 08:18    Procedures Procedures (including critical care time)  Medications Ordered in ED Medications  sodium chloride 0.9 % bolus 500 mL (0 mLs Intravenous Stopped 10/06/16 1211)     Initial Impression / Assessment and Plan / ED Course  I have reviewed the triage vital signs and the nursing notes.  Pertinent labs & imaging results that were available during my care of the patient were reviewed by me and considered in my medical decision making (see chart for details).     Patient is a 76 year old male who presents with  confusion. He doesn't completely remember what happened last night. On my reassessment, he does seem to have a little bit of slurred speech but otherwise is neurologically intact. His head CT is negative. I don't see any source of infection. He is mildly tachycardic but has not taken his medications today. His INR is subtherapeutic. He has mild acute kidney injury. I did get a call from his PCP, [name] who reports that patient does have some issues with his memory/dementia. She also notes that she got a call from his neighbor who is a physician who said that his living condition were very bad with trash all over the house. His oxygen wasn't hooked up. They did not feel that it was safe for patient to return to that environment. Given his ongoing issues, I will consult the hospitalist for admission.  I spoke with Dr. [name] who will admit the pt.  Final Clinical Impressions(s) / ED Diagnoses   Final diagnoses:  Confusion  AKI (acute kidney injury) ([place])    New Prescriptions New Prescriptions   No medications on file     Malvin Johns, MD 10/06/16 1224

## 2016-10-06 NOTE — Progress Notes (Signed)
Pt came to MRI for a mri of the brain. Pt was removed of all objects by [facility] LONG MRI TECH EXTENDER) in the ER. Pt had A wallet which was left in the ER room. Pt was screened and pt slide himself on to the mri table. Once pt was done, he slid himself back over to his ER stretcher. When pt was being wheeled out of the mri scan area. The pt was asked by me if he had his wallet since rebecca had left for the day and I wanted to ensure that he had not placed it into one of the [employer]. He informed me that his wallet was back in his room and that his phone was lost and his exact words were, ( my wallet is in my room but my phone is lost, that's still a mystery.) A short time after his scan was complete RN Jana Half called to inform of the pt states his phone was stuck to him while in the scanner. This would not have been possible if he slid himself over to the table. I called Jana Half back to confirm this story. She informed me that they plugged the phone up and it Turned on and started regaining charge. If a pt had a cell phone that went into the bore of the scanner while being scanned the battery would no longer work and would not turn back on due to the strong magnetic force it would erase all working components of the phone.

## 2016-10-06 NOTE — Telephone Encounter (Signed)
Received call from Dr. Waneta Martins orthopedic MD from [facility] [phone]). He called because he is patient's neighbor and wanted to relay to me that he found patient very confused, hallucinating. Helped patient up to his apartment and it was filled with 3 foot high trash, uninhabitable. Smelled terrible.  Reports patient's portable oxygen was not working properly. Pt thought he was hooked up to his compressor after they went upstairs but he forgot. He phoned the patient's son who will be coming in on Tuesday from [place].  He felt that patient is not safe to return home. I relayed this information to Dr. Tamera Punt who is evaluating patient in the ED today. She will look into admitting him for placement.

## 2016-10-06 NOTE — H&P (Addendum)
History and Physical    Mark [name]:[id number] DOB: [birth year] DOA: 10/06/2016  Referring MD/NP/PA: Malvin Johns PCP: [name], NP  Outpatient Specialists: Dr. Stanford Breed cardiology  Patient coming from: home alone  Chief Complaint:  Found sleeping outside his apartment this morning, confusion, slurred speech  HPI: Mark [name] is a 76 y.o. male with history of AAA, atrial fibrillation, coronary artery disease, CKD, GERD, renal cell carcinoma status post nephrectomy, hypertension, dementia who presents with confusion, slurred speech.  History obtained from records as patient is confused and unable to provide history.  He was hospitalized in April of this year for confusion and bizarre behaviors at which time he underwent CT head, MRI brain, EEG which were consistent with dementia.  Case was discussed with Neurology and Psychiatry who felt he had dementia but did have capacity to make decisions about disposition and medical care.  He was discharged to home.  Since discharge, he has not reliably followed up for all of his appointments.  He was found by a neighbor on the morning of admission sleeping outside of his apartment.  He was slurring his words and confused.  EMS was called and he was transported to the ER.  No obvious facial droop or focal weakness was noted.  His oxygen was found to not be hooked up and his O2 sat was 89%.  Patient denies focal weakness or numbness, fevers, cough, vomiting, diarrhea, dysuria.  He does not remember eating or drinking for the last several days and cannot remember when he last took any of his medications.    ED Course:  Vital signs notable for O2 sat 89%, afebrile.  HR in the 110-120 in atrial fibrillation.  Labs notable for INR of 1.94.  Creatinine is 3.08 up from baseline of 1.8.  CXR and UA unremarkable. CT head demonstrated evidence of atrophy/volume loss but no obvious stroke.  He continued to have intermittent slurred speech in the ER and is  at risk for stroke due to a-fib with subtherapeutic INR.  Persistently confused.    Review of Systems:  Limited by patient's confusion: General:  Denies fevers, chills HEENT:  Denies rhinorrhea, sinus congestion, sore throat CV:  Denies chest pain and palpitations, lower extremity edema.  PULM:  Denies SOB, cough.   GI:  Denies nausea, vomiting, constipation, diarrhea.   GU:  Denies dysuria, urgency ENDO:  Denies polyuria, polydipsia.  Feels thirsty HEME:  Denies abnormal bruising or bleeding.  LYMPH:  Denies lymphadenopathy.   MSK:  Denies arthralgias, myalgias.   DERM:  Denies skin rash or ulcer.   NEURO:  Denies focal numbness, weakness, facial droop.  Positive confusion and slurred speech PSYCH:  Denies anxiety and depression.    Past Medical History:  Diagnosis Date  . AAA (abdominal aortic aneurysm) (HCC)    9.1 cm repair 05/2009 ( ENDOVASCULAR REPAIR ).  MORE RECENT RE-EVALUATION OF ENLARGING AAA BY DR. Trula Slade AND BY DR. Kathlene Cote  MAY 2015 - PT STATES HE UNDERSTANDS THAT NOTHING IS LEAKING AT PRESENT TIME AND HE IS TO FOLLOW UP WITH HIS DOCTORS FOR FUTURE MONITORING I  . Arthritis    LEFT HAND, RT KNEE (08/14/2016)  . Basal cell carcinoma    "right side of my nose; think they burned it off" (08/14/2016)  . CAD (coronary artery disease)    a. s/p PCI to LAD/diagonal, subtotal of PDA 2006. b. Negative nuc 2014.  Marland Kitchen CHF (congestive heart failure) ([place]) 08/14/2016  . Chronic atrial fibrillation ([place])   .  CKD (chronic kidney disease), stage III    a. stage III-IV, Cr baseline appears 1.8-2.0  . COPD (chronic obstructive pulmonary disease) ([place])   . Depression   . GERD (gastroesophageal reflux disease)   . History of kidney stones   . History of pneumothorax    LONG TIME AGO  . Hyperlipidemia   . Hypertension   . Migraine    "very rare the last 20 years" (08/14/2016)  . Myocardial infarction ([place]) 2006  . Osteoarthritis   . Renal cell carcinoma ([place])   . Shingles outbreak     PT NOTICED SKIN RASH RT GROIN AND RIGHT TRUNK ON MONDAY 10/27/13 - NOT A LOT OF DISCOMFORT- NOT ON ANY ORAL MEDS TO TREAT.  . Sleep apnea    "dx'd; didn't try mask" (08/14/2016)  . Stroke [facility])    "CT showed I've had old strokes; never knew about it before today" (08/14/2016)    Past Surgical History:  Procedure Laterality Date  . ABDOMINAL AORTAGRAM N/A 09/24/2013   Procedure: ABDOMINAL Maxcine Ham;  Surgeon: Serafina Mitchell, MD;  Location: [facility] CATH LAB;  Service: Cardiovascular;  Laterality: N/A;  . ABDOMINAL AORTIC ANEURYSM REPAIR  JAN 2011   ENDOVASCULAR - DR. BRABHAM  . CARDIAC CATHETERIZATION  07/04/2004   Archie Endo 07/04/2004  . COLONOSCOPY  early 1990s   /notes 11/11/2008  . CORONARY ANGIOPLASTY WITH STENT PLACEMENT  07/05/2004   notes 07/05/2004  . KNEE ARTHROSCOPY Right   . LITHOTRIPSY    . ROBOT ASSISTED LAPAROSCOPIC NEPHRECTOMY Left 11/06/2013   Procedure: ROBOTIC ASSISTED LAPAROSCOPIC RADICAL  NEPHRECTOMY;  Surgeon: Alexis Frock, MD;  Location: [facility] ORS;  Service: Urology;  Laterality: Left;  . TONSILLECTOMY  1964     reports that he quit smoking about 36 years ago. His smoking use included Cigarettes. He has a 34.50 pack-year smoking history. He has never used smokeless tobacco. He reports that he drinks about 1.5 oz of alcohol per week . He reports that he does not use drugs.  Allergies  Allergen Reactions  . Ace Inhibitors Cough    Family History  Problem Relation Age of Onset  . Pulmonary embolism Mother        died of PE  . Heart disease Mother        before age 63  . Anuerysm Father        history of popliteal  . Coronary artery disease Unknown   . Hyperlipidemia Unknown   . Hypertension Unknown   . Prostate cancer Unknown   . Other Neg Hx        polycystic kidney disease, and colon cancer  . Colon cancer Neg Hx     Prior to Admission medications   Medication Sig Start Date End Date Taking? Authorizing Provider  acetaminophen (TYLENOL) 500 MG tablet Take 1,000 mg  by mouth every 6 (six) hours as needed (For pain.).   Yes [provider]  albuterol (PROVENTIL HFA;VENTOLIN HFA) 108 (90 Base) MCG/ACT inhaler Inhale 2 puffs into the lungs every 6 (six) hours as needed for wheezing or shortness of breath. 01/26/16  Yes Shelda Pal, DO  atorvastatin (LIPITOR) 40 MG tablet TAKE 1 TABLET ONE TIME DAILY Patient taking differently: Take 40 mg by mouth daily. TAKE 1 TABLET ONE TIME DAILY 04/06/16  Yes [name], NP  carvedilol (COREG) 25 MG tablet Take 1 tablet (25 mg total) by mouth 2 (two) times daily with a meal. 08/22/16  Yes Lavina Hamman, MD  Coenzyme Q10 200  MG capsule Take 200 mg by mouth daily.   Yes [provider]  feeding supplement, ENSURE ENLIVE, (ENSURE ENLIVE) LIQD Take 237 mLs by mouth daily. 08/22/16  Yes Lavina Hamman, MD  furosemide (LASIX) 20 MG tablet Take 3 tablets (60 mg total) by mouth 2 (two) times daily. 09/01/16  Yes [name], NP  hydrALAZINE (APRESOLINE) 10 MG tablet Take 1 tablet (10 mg total) by mouth 3 (three) times daily. 09/20/16  Yes Lelon Perla, MD  hydrocortisone cream 1 % Apply 1 application topically as needed for itching (For rash.).   Yes [provider]  isosorbide dinitrate (ISORDIL) 20 MG tablet Take 1 tablet (20 mg total) by mouth 3 (three) times daily. 04/06/16  Yes [name], NP  Multiple Vitamins-Minerals (MACUVITE EYE CARE PO) Take 1 capsule by mouth daily. 05/21/15  Yes [provider]  Omega-3 Fatty Acids (FISH OIL) 1200 MG CAPS Take 1,200 mg by mouth daily.    Yes [provider]  potassium chloride SA (K-DUR,KLOR-CON) 20 MEQ tablet Take 1 tablet (20 mEq total) by mouth every evening. 08/22/16  Yes Lavina Hamman, MD  ranitidine (ZANTAC) 150 MG tablet Take 1 tablet (150 mg total) by mouth 2 (two) times daily. 04/06/16  Yes [name], NP  sertraline (ZOLOFT) 100 MG tablet Take 1 tablet (100 mg total) by mouth every  evening. 04/06/16  Yes [name], NP  tamsulosin (FLOMAX) 0.4 MG CAPS capsule TAKE 1 CAPSULE (0.4 MG TOTAL) DAILY AFTER BREAKFAST. Patient taking differently: Take 0.4 mg by mouth daily after breakfast.  09/22/16  Yes [name], NP  traZODone (DESYREL) 50 MG tablet Take 1 tablet (50 mg total) by mouth at bedtime. Patient taking differently: Take 50 mg by mouth at bedtime as needed for sleep.  08/22/16  Yes Lavina Hamman, MD  warfarin (COUMADIN) 5 MG tablet TAKE 1 TABLET EVERY DAY EXCEPT 1.5 TABLETS ON WEDNESDAY. Patient taking differently: Take 5-7.5 mg by mouth See admin instructions. Take one tablet every day except for on Wednesday. Take one and a half tablets on that day. 06/02/16  Yes [name], NP    Physical Exam: Vitals:   10/06/16 1100 10/06/16 1217 10/06/16 1735 10/06/16 1745  BP: (!) 117/105 (!) 127/106 111/81 111/81  Pulse:  100 (!) 138 (!) 138  Resp: 17 18 18    Temp:   97.9 F (36.6 C) 97.9 F (36.6 C)  TempSrc:   Oral Oral  SpO2:  92% 92% 92%  Weight:    100.2 kg (221 lb)  Height:    5\' 11"  (1.803 m)    Constitutional: adult male, NAD, calm, comfortable Eyes: PERRL, lids and conjunctivae normal ENMT: Mucous membranes are moist. Posterior pharynx clear of any exudate or lesions.Normal dentition.  Neck: normal, supple, no masses, no thyromegaly Respiratory: clear to auscultation bilaterally, no wheezing, no crackles. Normal respiratory effort. No accessory muscle use.  Cardiovascular: Regular rate and rhythm, no murmurs / rubs / gallops. No extremity edema. 2+ pedal pulses. No carotid bruits.  Abdomen: no tenderness, no masses palpated. No hepatosplenomegaly. Bowel sounds positive.  Musculoskeletal: no clubbing / cyanosis. No joint deformity upper and lower extremities. Good ROM, no contractures. Normal muscle tone.  Skin: no rashes, lesions, ulcers. No induration Neurologic: CN 2-12 grossly intact. Sensation intact, DTR normal. Strength 5/5  in all 4.  Psychiatric: Normal judgment and insight. Alert and oriented x 3. Normal mood.   Labs on Admission: I have personally reviewed following labs and  imaging studies  CBC:  Recent Labs Lab 10/06/16 0745  WBC 6.0  HGB 11.6*  HCT 36.4*  MCV 93.8  PLT 175*   Basic Metabolic Panel:  Recent Labs Lab 10/06/16 0745  NA 140  K 3.5  CL 105  CO2 23  GLUCOSE 108*  BUN 60*  CREATININE 3.08*  CALCIUM 8.7*   GFR: Estimated Creatinine Clearance: 25.8 mL/min (A) (by C-G formula based on SCr of 3.08 mg/dL (H)). Liver Function Tests:  Recent Labs Lab 10/06/16 0745  AST 37  ALT 74*  ALKPHOS 67  BILITOT 1.9*  PROT 6.1*  ALBUMIN 3.3*   No results for input(s): LIPASE, AMYLASE in the last 168 hours. No results for input(s): AMMONIA in the last 168 hours. Coagulation Profile:  Recent Labs Lab 10/06/16 0745  INR 1.94   Cardiac Enzymes: No results for input(s): CKTOTAL, CKMB, CKMBINDEX, TROPONINI in the last 168 hours. BNP (last 3 results) No results for input(s): PROBNP in the last 8760 hours. HbA1C: No results for input(s): HGBA1C in the last 72 hours. CBG:  Recent Labs Lab 10/06/16 0802  GLUCAP 109*   Lipid Profile: No results for input(s): CHOL, HDL, LDLCALC, TRIG, CHOLHDL, LDLDIRECT in the last 72 hours. Thyroid Function Tests: No results for input(s): TSH, T4TOTAL, FREET4, T3FREE, THYROIDAB in the last 72 hours. Anemia Panel: No results for input(s): VITAMINB12, FOLATE, FERRITIN, TIBC, IRON, RETICCTPCT in the last 72 hours. Urine analysis:    Component Value Date/Time   COLORURINE YELLOW 10/06/2016 0743   APPEARANCEUR CLEAR 10/06/2016 0743   LABSPEC 1.013 10/06/2016 0743   PHURINE 5.0 10/06/2016 0743   GLUCOSEU NEGATIVE 10/06/2016 0743   GLUCOSEU NEGATIVE 07/17/2014 0945   HGBUR NEGATIVE 10/06/2016 0743   BILIRUBINUR NEGATIVE 10/06/2016 0743   KETONESUR NEGATIVE 10/06/2016 0743   PROTEINUR 30 (A) 10/06/2016 0743   UROBILINOGEN 0.2 07/17/2014  0945   NITRITE NEGATIVE 10/06/2016 0743   LEUKOCYTESUR TRACE (A) 10/06/2016 0743   Sepsis Labs: @LABRCNTIP (procalcitonin:4,lacticidven:4) )No results found for this or any previous visit (from the past 240 hour(s)).   Radiological Exams on Admission: Dg Chest 2 View  Result Date: 10/06/2016 CLINICAL DATA:  Cough and congestion for 2 weeks.  Chest pain. EXAM: CHEST  2 VIEW COMPARISON:  Sep 15, 2016 and September 01, 2016 FINDINGS: There is stable interstitial prominence, most notably in the left upper lobe and both bases, likely due to underlying fibrotic type change. There is no appreciable edema or consolidation. Heart is borderline prominent with pulmonary vascularity within normal limits. No evident adenopathy. There is degenerative change in the thoracic spine with diffuse idiopathic skeletal hyperostosis. IMPRESSION: Areas of underlying scarring/ fibrotic change without frank edema or consolidation. Stable cardiac prominence. 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 No evident adenopathy. There is no appreciable change compared to recent studies. Electronically Signed   By: Lowella Grip III M.D.   On: 10/06/2016 08:00   Ct Head Wo Contrast  Result Date: 10/06/2016 CLINICAL DATA:  Slurred speech and altered mental status. Avika Carbine-term memory loss. EXAM: CT HEAD WITHOUT CONTRAST TECHNIQUE: Contiguous axial images were obtained from the base of the skull through the vertex without intravenous contrast. COMPARISON:  August 14, 2016 head CT and brain MRI August 14, 2016 FINDINGS: Brain: There is age related volume loss. There is no intracranial mass, hemorrhage, extra-axial fluid collection, or midline shift. There is small vessel disease throughout much of the centra semiovale bilaterally, stable. There is small vessel disease in each caudate  nucleus region as well as in the anterior limbs of each internal capsule, stable. There is no new gray-white compartment lesion. No  acute infarct evident. Vascular: No hyperdense vessel. There is calcification in each carotid siphon region. Skull: Bony calvarium appears intact. Sinuses/Orbits: There is a a retention cyst, incompletely visualized, in the anterior left maxillary antrum with associated mucosal thickening. There is mucosal thickening in several ethmoid air cells bilaterally. Frontal sinuses are hypoplastic. Visualized paranasal sinuses elsewhere clear. Orbits appear symmetric bilaterally. Other: Mastoid air cells are clear. IMPRESSION: Fairly extensive supratentorial small vessel disease remain stable. There is age related volume loss. No intracranial mass, hemorrhage, or extra-axial fluid collection. No acute appearing infarct. Areas of vascular calcification noted. Areas of paranasal sinus disease as summarized above. Electronically Signed   By: Lowella Grip III M.D.   On: 10/06/2016 08:18   Mr Brain Wo Contrast  Result Date: 10/06/2016 CLINICAL DATA:  76 year old male with confusion and slurred speech today. Possible dementia. EXAM: MRI HEAD WITHOUT CONTRAST TECHNIQUE: Multiplanar, multiecho pulse sequences of the brain and surrounding structures were obtained without intravenous contrast. COMPARISON:  Head CT without contrast 0806 hours today. Brain MRI 08/14/2016. FINDINGS: Brain: No restricted diffusion to suggest acute infarction. No midline shift, mass effect, evidence of mass lesion, ventriculomegaly, extra-axial collection or acute intracranial hemorrhage. Cervicomedullary junction and pituitary are within normal limits. Confluent bilateral cerebral white matter T2 and FLAIR hyperintensity with several small areas which most resemble chronic white matter lacunar infarcts. Similar widespread T2 heterogeneity throughout the deep gray matter nuclei, the midbrain and pons, in part related to perivascular spaces. No new signal abnormality identified. Occasional chronic micro hemorrhages in the brain ir stable (right  parietal lobe, left posterior temporal lobe). No new signal abnormality identified. Vascular: Major intracranial vascular flow voids are stable with generalized intracranial artery dolichoectasia. Skull and upper cervical spine: Negative. Normal bone marrow signal. Sinuses/Orbits: Normal orbits soft tissues. Stable paranasal sinuses and mastoids, with occasional mucosal thickening and small mucous retention cysts. Other: Visible internal auditory structures appear normal. Negative scalp soft tissues. IMPRESSION: 1. Stable noncontrast MRI appearance of the brain since April with no acute intracranial abnormality. 2. Widespread signal changes again suggestive of advanced chronic small vessel disease. Consider vascular causes of dementia. Electronically Signed   By: Genevie Ann M.D.   On: 10/06/2016 15:17    EKG: Independently reviewed. Atrial fibrillation with intraventricular conduction delay, unchanged from prior. No evidence of acute ischemia. Heart rate 121 bpm.  QTc 528msec.    Assessment/Plan Active Problems:   AKI (acute kidney injury) ([place])    Acute encephalopathy with slurred speech superimposed on underlying dementia with bizarre behaviors, hallucinations.  No obvious underlying infection.  May be dehydrated but also at risk for stroke given atrial fibrillation -  NPO pending stroke swallow screen -  Aspirin once -  MRI brain but will hold off on further work up unless MRI positive for stroke -  Previous admission: ammonia normal, TSH normal, B12 normal, RPR non reactive -  UDS pending -  Psychiatry consultation to determine capacity for medical decision making and disposition.  -  IVF  -  SLP evaluation  Acute on chronic kidney disease, stage III, creatinine 3.08, baseline 1.8 -  IVF -  Minimize nephrotoxins and renally dose medications -  Repeat BMP in AM  Atrial fibrillation with RVR, and mildly subtherapeutic INR -  Resume carvedilol -  Metoprolol 5 mg IV now and q6h prn HR >  115 -  Schedule oral diltiazem -  Warfarin per pharmacy, patient noncompliant with INR checks as outpatient  Prolonged QTc -  Avoid QTc prolonging medications -  Check magnesium  Chronic respiratory failure, currently O2 sat in high 80s on 3L [place]  Chronic systolic heart failure with LBBB, moderate MR -Previous echo w/ nml EF and evidence of diastolic chf. -patient missed appointment for stress test and holter - Resume Coreg.  Essential hypertension, blood pressures stable -continue Coreg, isosorbide  BPH, stable -continue flomax  GERD, stable -continue pepcid  Depression, stable -continue zoloft  Hyperlipidemia, stable -continue lipitor  AAA -f/u outpt. Pt due for his yearly appt and imaging.   Social issues. Patient has a complex social/home situation and was previously deemed to have capacity for decision making by psychiatry -  Psych consult again for capacity -  CM and SW consults placed  DVT prophylaxis: warfarin, SCDs  Code Status: full  Family Communication: patient alone  Disposition Plan: home vs. Memory unit in 1-2 days   Consults called: Psychiatry  Admission status: observation, stepdown unless MRI negative for stroke, then obs/tele.     Janece Canterbury MD [facility] Pager [phone]  If 7PM-7AM, please contact night-coverage [email or web address] Password [facility]  10/06/2016, 6:26 PM

## 2016-10-07 DIAGNOSIS — I5021 Acute systolic (congestive) heart failure: Secondary | ICD-10-CM

## 2016-10-07 DIAGNOSIS — G9340 Encephalopathy, unspecified: Principal | ICD-10-CM

## 2016-10-07 DIAGNOSIS — I10 Essential (primary) hypertension: Secondary | ICD-10-CM

## 2016-10-07 DIAGNOSIS — I482 Chronic atrial fibrillation: Secondary | ICD-10-CM | POA: Diagnosis not present

## 2016-10-07 DIAGNOSIS — R443 Hallucinations, unspecified: Secondary | ICD-10-CM

## 2016-10-07 DIAGNOSIS — I639 Cerebral infarction, unspecified: Secondary | ICD-10-CM

## 2016-10-07 DIAGNOSIS — Z87891 Personal history of nicotine dependence: Secondary | ICD-10-CM

## 2016-10-07 DIAGNOSIS — N179 Acute kidney failure, unspecified: Secondary | ICD-10-CM | POA: Diagnosis not present

## 2016-10-07 DIAGNOSIS — I447 Left bundle-branch block, unspecified: Secondary | ICD-10-CM | POA: Diagnosis not present

## 2016-10-07 DIAGNOSIS — F0281 Dementia in other diseases classified elsewhere with behavioral disturbance: Secondary | ICD-10-CM | POA: Diagnosis not present

## 2016-10-07 DIAGNOSIS — F05 Delirium due to known physiological condition: Secondary | ICD-10-CM

## 2016-10-07 DIAGNOSIS — G309 Alzheimer's disease, unspecified: Secondary | ICD-10-CM

## 2016-10-07 DIAGNOSIS — R410 Disorientation, unspecified: Secondary | ICD-10-CM

## 2016-10-07 LAB — CBC
HCT: 35.3 % — ABNORMAL LOW (ref 39.0–52.0)
Hemoglobin: 11.1 g/dL — ABNORMAL LOW (ref 13.0–17.0)
MCH: 29.5 pg (ref 26.0–34.0)
MCHC: 31.4 g/dL (ref 30.0–36.0)
MCV: 93.9 fL (ref 78.0–100.0)
PLATELETS: 107 10*3/uL — AB (ref 150–400)
RBC: 3.76 MIL/uL — ABNORMAL LOW (ref 4.22–5.81)
RDW: 17.3 % — ABNORMAL HIGH (ref 11.5–15.5)
WBC: 5 10*3/uL (ref 4.0–10.5)

## 2016-10-07 LAB — GLUCOSE, CAPILLARY: Glucose-Capillary: 106 mg/dL — ABNORMAL HIGH (ref 65–99)

## 2016-10-07 LAB — PROTIME-INR
INR: 2.41
PROTHROMBIN TIME: 26.7 s — AB (ref 11.4–15.2)

## 2016-10-07 LAB — LIPID PANEL
CHOL/HDL RATIO: 3.9 ratio
Cholesterol: 98 mg/dL (ref 0–200)
HDL: 25 mg/dL — ABNORMAL LOW (ref >40)
LDL CALC: 54 mg/dL (ref 0–99)
Triglycerides: 93 mg/dL (ref <150)
VLDL: 19 mg/dL (ref 0–40)

## 2016-10-07 LAB — BASIC METABOLIC PANEL
Anion gap: 7 (ref 5–15)
BUN: 56 mg/dL — ABNORMAL HIGH (ref 6–20)
CALCIUM: 8.6 mg/dL — AB (ref 8.9–10.3)
CHLORIDE: 109 mmol/L (ref 101–111)
CO2: 25 mmol/L (ref 22–32)
CREATININE: 2.55 mg/dL — AB (ref 0.61–1.24)
GFR, EST AFRICAN AMERICAN: 27 mL/min — AB (ref >60)
GFR, EST NON AFRICAN AMERICAN: 23 mL/min — AB (ref >60)
Glucose, Bld: 130 mg/dL — ABNORMAL HIGH (ref 65–99)
Potassium: 4.2 mmol/L (ref 3.5–5.1)
SODIUM: 141 mmol/L (ref 135–145)

## 2016-10-07 MED ORDER — FUROSEMIDE 20 MG PO TABS: 60.0000 mg | ORAL_TABLET | Freq: Every day | ORAL | Status: DC

## 2016-10-07 MED ORDER — WARFARIN SODIUM 2.5 MG PO TABS
2.5000 mg | ORAL_TABLET | Freq: Once | ORAL | Status: AC
  Administered 2016-10-07: 2.5 mg via ORAL
  Filled 2016-10-07: qty 1

## 2016-10-07 NOTE — Progress Notes (Signed)
NCM spoke to ex-wife, [name]. She is in [place] and both sons live out of state. Contacted friend, Tarri Fuller # [phone]. Left message on phone. Contacted [employer] AD and taxi voucher approved. Jonnie Finner RN CCM Case Mgmt phone [phone]

## 2016-10-07 NOTE — Progress Notes (Addendum)
ANTICOAGULATION CONSULT NOTE - Follow up [place] for warfarin Indication: atrial fibrillation  Allergies  Allergen Reactions  . Ace Inhibitors Cough    Patient Measurements: Height: 5\' 11"  (180.3 cm) Weight: 212 lb (96.2 kg) IBW/kg (Calculated) : 75.3  Vital Signs: Temp: 97.8 F (36.6 C) (06/02 0428) Temp Source: Oral (06/02 0428) BP: 123/94 (06/02 0428) Pulse Rate: 118 (06/02 0428)  Labs:  Recent Labs  10/06/16 0745 10/07/16 0819  HGB 11.6*  --   HCT 36.4*  --   PLT 121*  --   LABPROT 22.5* 26.7*  INR 1.94 2.41  CREATININE 3.08* 2.55*    Estimated Creatinine Clearance: 30.5 mL/min (A) (by C-G formula based on SCr of 2.55 mg/dL (H)).  Medications:  Scheduled:  . atorvastatin  40 mg Oral Daily  . carvedilol  25 mg Oral BID WC  . diltiazem  30 mg Oral Q6H  . famotidine  20 mg Oral BID  . feeding supplement (ENSURE ENLIVE)  237 mL Oral Q24H  . isosorbide dinitrate  20 mg Oral TID  . sertraline  100 mg Oral QPM  . sodium chloride flush  3 mL Intravenous Q12H  . tamsulosin  0.4 mg Oral QPC breakfast  . traZODone  50 mg Oral QHS  . Warfarin - Pharmacist Dosing Inpatient   Does not apply q1800    Assessment: 70 yoM admitted on 6/1 with acute encephalopathy.  PMH includes AAA, CAD, GERD, CKD and renal cell carcinoma status post nephrectomy, hypertension, dementia, and afib on chronic warfarin anticoagulation.  Pharmacy is consulted to continue warfarin dosing.  Pt last seen at warfarin clinic on 09/06/16 and home dosing was warfarin 5 mg PO daily except 7.5 mg PO on wednesdays.  Last dose not reliable as pt is persistently confused and cannot remember when he last took his medications. Admission INR 1.94, Hgb 11.6  Today, 10/07/2016:  INR 2.41, therapeutic but with large increase overnight  CBC: Hgb low/stable at 11.1, Plt decreased to 107  SCr 2.55  Diet: heart healthy  No major drug-drug interactions  Goal of Therapy:  INR 2-3 Monitor  platelets by anticoagulation protocol: Yes   Plan:   Warfarin 2.5 mg PO x1   Daily INR  Monitor for signs and symptoms of bleeding    Gretta Arab PharmD, BCPS Pager [phone] 10/07/2016 9:17 AM

## 2016-10-07 NOTE — Evaluation (Signed)
Speech Language Pathology Evaluation Patient Details Name: Mark [name] MRN: [id number] DOB: Mar 01, 1941 Today's Date: 10/07/2016 Time: 0814-4818 SLP Time Calculation (min) (ACUTE ONLY): 30 min  Problem List:  Patient Active Problem List   Diagnosis Date Noted  . Subacute delirium 10/07/2016  . Cerebrovascular accident (CVA) ([place])   . AKI (acute kidney injury) ([place]) 10/06/2016  . Alzheimer's dementia with behavioral disturbance   . Hallucinations   . LBBB (left bundle branch block)   . Acute systolic CHF (congestive heart failure) ([place]) 08/16/2016  . Acute encephalopathy 08/14/2016  . Acute on chronic respiratory failure with hypoxia ([place]) 08/14/2016  . Asthma 03/24/2016  . Preventative health care 09/22/2015  . Osteopenia 09/07/2014  . Osteoarthritis, hand 04/09/2014  . Normocytic anemia 04/09/2014  . OSA (obstructive sleep apnea) 01/08/2014  . Daytime somnolence 11/20/2013  . Renal cell carcinoma ([place]) 11/06/2013  . AAA (abdominal aortic aneurysm) without rupture ([place]) 09/22/2013  . Atrial fibrillation, chronic ([place]) 07/25/2012  . 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 BPH (benign prostatic hyperplasia) 12/21/2009  . Abdominal aortic aneurysm ([place]) 05/28/2009  . Disorder resulting from impaired renal function 05/28/2009  . Hyperlipidemia 11/11/2008  . Depression 11/11/2008  . Essential hypertension 11/11/2008  . Coronary atherosclerosis 11/11/2008  . GERD 11/11/2008  . SKIN CANCER, HX OF 11/11/2008  . NEPHROLITHIASIS, HX OF 11/11/2008   Past Medical History:  Past Medical History:  Diagnosis Date  . AAA (abdominal aortic aneurysm) (HCC)    9.1 cm repair 05/2009 ( ENDOVASCULAR REPAIR ).  MORE RECENT RE-EVALUATION OF ENLARGING AAA BY DR. Trula Slade AND BY DR. Kathlene Cote  MAY 2015 - PT STATES HE UNDERSTANDS THAT NOTHING IS LEAKING AT PRESENT TIME AND HE IS TO FOLLOW UP WITH HIS DOCTORS FOR FUTURE MONITORING I  . Arthritis    LEFT HAND, RT KNEE (08/14/2016)   . Basal cell carcinoma    "right side of my nose; think they burned it off" (08/14/2016)  . CAD (coronary artery disease)    a. s/p PCI to LAD/diagonal, subtotal of PDA 2006. b. Negative nuc 2014.  Marland Kitchen CHF (congestive heart failure) ([place]) 08/14/2016  . Chronic atrial fibrillation ([place])   . CKD (chronic kidney disease), stage III    a. stage III-IV, Cr baseline appears 1.8-2.0  . COPD (chronic obstructive pulmonary disease) ([place])   . Depression   . GERD (gastroesophageal reflux disease)   . History of kidney stones   . History of pneumothorax    LONG TIME AGO  . Hyperlipidemia   . Hypertension   . Migraine    "very rare the last 20 years" (08/14/2016)  . Myocardial infarction ([place]) 2006  . Osteoarthritis   . Renal cell carcinoma ([place])   . Shingles outbreak    PT NOTICED SKIN RASH RT GROIN AND RIGHT TRUNK ON MONDAY 10/27/13 - NOT A LOT OF DISCOMFORT- NOT ON ANY ORAL MEDS TO TREAT.  . Sleep apnea    "dx'd; didn't try mask" (08/14/2016)  . Stroke [facility])    "CT showed I've had old strokes; never knew about it before today" (08/14/2016)   Past Surgical History:  Past Surgical History:  Procedure Laterality Date  . ABDOMINAL AORTAGRAM N/A 09/24/2013   Procedure: ABDOMINAL Maxcine Ham;  Surgeon: Serafina Mitchell, MD;  Location: [facility] CATH LAB;  Service: Cardiovascular;  Laterality: N/A;  . ABDOMINAL AORTIC ANEURYSM REPAIR  JAN 2011   ENDOVASCULAR - DR. BRABHAM  . CARDIAC CATHETERIZATION  07/04/2004   Archie Endo 07/04/2004  . COLONOSCOPY  early 1990s   /notes 11/11/2008  . CORONARY ANGIOPLASTY WITH STENT PLACEMENT  07/05/2004   notes 07/05/2004  . KNEE ARTHROSCOPY Right   . LITHOTRIPSY    . ROBOT ASSISTED LAPAROSCOPIC NEPHRECTOMY Left 11/06/2013   Procedure: ROBOTIC ASSISTED LAPAROSCOPIC RADICAL  NEPHRECTOMY;  Surgeon: Alexis Frock, MD;  Location: [facility] ORS;  Service: Urology;  Laterality: Left;  . TONSILLECTOMY  1964   HPI:  76 y.o. male admitted for subacute delirium. PMH significant for Alzheimer's  dementia, hallucinations, depression, CHF, AAA, HLD, HTN, GERD, a-fib, asthma, use of home O2.    Assessment / Plan / Recommendation Clinical Impression  Patient presents with a moderate cognitive-linguistic impairment although unable to determine how close patient is to baseline. He has a premorbid diagnosis of Alzheimer's dementia and patient is aware in a general sense that he has memory difficulties, however he does not seem to functional manage this. Patient exhibits word finding errors, circumlocutions and difficulty maintaing topic during conversation. He is able to recall events today and even that OT told him about the PACE program, and is oriented to time and place. Patient stated that he has difficulty managing all his medications and he was open to having in home therapy to help him, as he would like to remain in his home.     SLP Assessment  SLP Recommendation/Assessment: All further Speech Lanaguage Pathology  needs can be addressed in the next venue of care SLP Visit Diagnosis: Cognitive communication deficit (R41.841)    Follow Up Recommendations  Home health SLP    Frequency and Duration   N/A        SLP Evaluation Cognition  Overall Cognitive Status: No family/caregiver present to determine baseline cognitive functioning Orientation Level: Oriented X4 Attention: Sustained;Selective Sustained Attention: Appears intact Selective Attention: Impaired Selective Attention Impairment: Verbal complex Memory: Impaired Memory Impairment: Retrieval deficit;Storage deficit Awareness: Impaired Awareness Impairment: Intellectual impairment Problem Solving: Impaired Problem Solving Impairment: Verbal complex;Functional complex Executive Function: Writer: Impaired Organizing Impairment: Verbal complex Safety/Judgment: Impaired       Comprehension  Auditory Comprehension Overall Auditory Comprehension: Appears within functional limits for tasks assessed     Expression Expression Primary Mode of Expression: Verbal Verbal Expression Overall Verbal Expression: Impaired Initiation: No impairment Level of Generative/Spontaneous Verbalization: Conversation Pragmatics: Impairment Impairments: Topic maintenance Interfering Components: Attention Effective Techniques: Open ended questions;Semantic cues Non-Verbal Means of Communication: Not applicable   Oral / Motor  Oral Motor/Sensory Function Overall Oral Motor/Sensory Function: Within functional limits   GO          Functional Limitations: Spoken language expressive Spoken Language Expression Current Status [phone]): At least 20 percent but less than 40 percent impaired, limited or restricted Spoken Language Expression Goal Status [phone]): At least 20 percent but less than 40 percent impaired, limited or restricted Memory Current Status (A1937): At least 20 percent but less than 40 percent impaired, limited or restricted Memory Goal Status (T0240): At least 20 percent but less than 40 percent impaired, limited or restricted         Dannial Monarch 10/07/2016, 6:14 PM

## 2016-10-07 NOTE — Evaluation (Signed)
Occupational Therapy Evaluation Patient Details Name: Mark [name] MRN: [id number] DOB: [birth year] Today's Date: 10/07/2016    History of Present Illness 76 y.o. male admitted for subacute delirium. PMH significant for Alzheimer's dementia, hallucinations, depression, CHF, AAA, HLD, HTN, GERD, a-fib, asthma, use of home O2.    Clinical Impression   PTA, pt was independent with ADL and functional mobility. Pt currently presents with cognitive deficits related to delirium and dementia. Completed MOCA cognitive assessment with pt and scored 21/30 indicating mild cognitive impairment with deficits primarily in memory (immediate and delayed recall) and executive functioning. Pt verbalized and demonstrated difficulty with medication management, O2 management, and sequencing through tasks such grocery shopping and transportation via bus system. Pt reported ~6 falls since the beginning of the year likely due to severe clutter in his home and mild LE weakness (no significant vision deficits determined). Pt demonstrated no significant difficulty or safety concerns related to ADL or functional balance and mobility. Pt could benefit from continued acute OT to help establish medication and O2 management routine and provide cognitive re-training education to pt and family. OT will continue to follow acutely.  **Referred pt to [facility] and provided literature for him to review with his ex-wife and/or son. Explained the program could assist him in staying in his home while providing essential assistance to him in order to prevent him from returning to the hospital unnecessarily. Feel pt could benefit HIGHLY from this program as he is still independent with many of his basic daily activities.        Follow Up Recommendations  Home health OT;Supervision - Intermittent    Equipment Recommendations  None recommended by OT    Recommendations for Other Services       Precautions /  Restrictions Precautions Precautions: Fall Restrictions Weight Bearing Restrictions: No      Mobility Bed Mobility Overal bed mobility: Modified Independent                Transfers Overall transfer level: Modified independent Equipment used: None Transfers: Sit to/from Stand Sit to Stand: Modified independent (Device/Increase time)         General transfer comment: Pt completed functional reaching into cabinets (high , low and off to sides) outside BOS without difficulty or LOB.  No physical assistance required for functional transfers or ambulation.    Balance Overall balance assessment: Independent                                         ADL either performed or assessed with clinical judgement   ADL Overall ADL's : Modified independent                                       General ADL Comments: No significant functional concerns regarding ADL. However, pt had difficulty verbalizing IADL routine including medication management, O2 management and verbalized concerns related to grocery shopping, transportation, and cleaning his house.      Vision Baseline Vision/History: Wears glasses Wears Glasses: Reading only Patient Visual Report: No change from baseline Vision Assessment?: No apparent visual deficits Additional Comments: Pt able to identify and locate items throughout room without difficulty. Pt was able to read and write small print.     Perception     Praxis  Pertinent Vitals/Pain Pain Assessment: No/denies pain     Hand Dominance Right   Extremity/Trunk Assessment Upper Extremity Assessment Upper Extremity Assessment: Overall WFL for tasks assessed   Lower Extremity Assessment Lower Extremity Assessment: Overall WFL for tasks assessed   Cervical / Trunk Assessment Cervical / Trunk Assessment: Normal   Communication Communication Communication: No difficulties   Cognition Arousal/Alertness:  Awake/alert Behavior During Therapy: Impulsive Overall Cognitive Status: History of cognitive impairments - at baseline                                 General Comments: Completed MOCA (cognitive assessment) with pt. Pt scored 21/30 which indicates mild cognitive impairment. Pt main deficit areas included visuospatial skills, memory and delayed recall. Pt had tangential thoughts and speech making it difficult to maintain conversation, but was easily redirected. Pt demonstrated ability to read, write, use iPad and smartphone with little to no difficulty. However, pt unable to correctly verbalize medication routine, O2 management, and fall prevention activities.    General Comments       Exercises     Shoulder Instructions      Home Living Family/patient expects to be discharged to:: Private residence Living Arrangements: Alone Available Help at Discharge: Family;Available PRN/intermittently (ex-wife available at times) Type of Home: Apartment Home Access: Stairs to enter [employer] of Steps:  (unknown)   Home Layout: One level                          Prior Functioning/Environment Level of Independence: Independent        Comments: Pt states he does not use assistive device, uses the bus, and at times was having trouble with his O2 portable tank running out, and he has lost his keys that is why he slept outside his apartment.         OT Problem List: Decreased strength;Decreased activity tolerance;Decreased knowledge of use of DME or AE;Decreased safety awareness;Cardiopulmonary status limiting activity      OT Treatment/Interventions: Self-care/ADL training;Therapeutic exercise;Energy conservation;DME and/or AE instruction;Therapeutic activities;Cognitive remediation/compensation;Patient/family education;Balance training    OT Goals(Current goals can be found in the care plan section) Acute Rehab OT Goals Patient Stated Goal: "I want to be  able to stay in my home. Nursing homes are just awful." OT Goal Formulation: With patient Time For Goal Achievement: 10/21/16 Potential to Achieve Goals: Good  OT Frequency: Min 2X/week   Barriers to D/C: Decreased caregiver support;Inaccessible home environment  Pt verbalized possible hoarding situation at home and that his family may be assisting him in cleaning up his  home or potentially moving to a different home. Pt lives alone and only has his ex-wife locally to assist if needed.       Co-evaluation              AM-PAC PT "6 Clicks" Daily Activity     Outcome Measure Help from another person eating meals?: None Help from another person taking care of personal grooming?: None Help from another person toileting, which includes using toliet, bedpan, or urinal?: None Help from another person bathing (including washing, rinsing, drying)?: None Help from another person to put on and taking off regular upper body clothing?: None Help from another person to put on and taking off regular lower body clothing?: None 6 Click Score: 24   End of Session Equipment Utilized During Treatment: Oxygen Nurse Communication: Mobility  status  Activity Tolerance: Patient tolerated treatment well Patient left: in bed;with call bell/phone within reach;with bed alarm set  OT Visit Diagnosis: Unsteadiness on feet (R26.81);History of falling (Z91.81)                Time: 9794-8016 OT Time Calculation (min): 57 min Charges:  OT General Charges $OT Visit: 1 Procedure OT Evaluation $OT Eval Moderate Complexity: 1 Procedure OT Treatments $Self Care/Home Management : 38-52 mins G-Codes: OT G-codes **NOT FOR INPATIENT CLASS** Functional Assessment Tool Used: Clinical judgement Functional Limitation: Self care Self Care Current Status (P5374): At least 1 percent but less than 20 percent impaired, limited or restricted Self Care Goal Status (M2707): At least 1 percent but less than 20 percent  impaired, limited or restricted   Redmond Baseman, MS, OTR/L 10/07/2016, 3:29 PM

## 2016-10-07 NOTE — Consult Note (Signed)
[facility] Face-to-Face Psychiatry Consult   Reason for Consult:  Capacity Referring Physician:  Dr. Zettie Pho Patient Identification: Mark [name] MRN:  [id number] Principal Diagnosis: Subacute delirium Diagnosis:   Patient Active Problem List   Diagnosis Date Noted  . AKI (acute kidney injury) ([place]) [N17.9] 10/06/2016  . Alzheimer's dementia with behavioral disturbance [G30.9, F02.81]   . Hallucinations [R44.3]   . LBBB (left bundle branch block) [I44.7]   . Acute systolic CHF (congestive heart failure) ([place]) [I50.21] 08/16/2016  . Acute encephalopathy [G93.40] 08/14/2016  . Acute on chronic respiratory failure with hypoxia ([place]) [J96.21] 08/14/2016  . Asthma [J45.909] 03/24/2016  . Preventative health care [Z00.00] 09/22/2015  . Osteopenia [M85.80] 09/07/2014  . Osteoarthritis, hand [M19.049] 04/09/2014  . Normocytic anemia [D64.9] 04/09/2014  . OSA (obstructive sleep apnea) [G47.33] 01/08/2014  . Daytime somnolence [R40.0] 11/20/2013  . Renal cell carcinoma ([place]) [C64.9] 11/06/2013  . AAA (abdominal aortic aneurysm) without rupture ([place]) [I71.4] 09/22/2013  . Atrial fibrillation, chronic ([place]) [I48.2] 07/25/2012  . ED (erectile dysfunction) of organic origin [N52.9] 05/11/2011  . BPH (benign prostatic hyperplasia) [N40.0] 12/21/2009  . Abdominal aortic aneurysm ([place]) [I71.4] 05/28/2009  . Disorder resulting from impaired renal function [N25.9] 05/28/2009  . Hyperlipidemia [E78.5] 11/11/2008  . Depression [F32.9] 11/11/2008  . Essential hypertension [I10] 11/11/2008  . Coronary atherosclerosis [I25.10] 11/11/2008  . GERD [K21.9] 11/11/2008  . SKIN CANCER, HX OF [E67.544] 11/11/2008  . NEPHROLITHIASIS, HX OF [Z87.442] 11/11/2008    Total Time spent with patient: 30 minutes  Subjective:   Mark [name] is a 76 y.o. male patient admitted with confusion.  HPI:  Patient seen chart reviewed.  Patient is 76 year old Caucasian single man who was admitted due to confusion.  Patient has  history of atrial fibrillation, coronary artery disease, chronic in disease, GERD, renal cell carcinoma status post nephrectomy, hypertension, AAA who presented with confusion and slurred speech.  Consult was called for capacity.  Patient presented to the hospital with same symptoms 6 weeks ago.  He was discharged with a capacity.  Today patient appears more calm cooperative and relevant.  He admitted that he missed appointment seeing his doctor but he realized that he needed to take care of himself.  He admitted not using oxygen as he supposed to.  He do not remember very well what brought him to the hospital but understand that he was confused.  He does not know who called 911 but he is relevant to provide information that he lives by himself.  His sister and niece comes regularly to clean the house and help him.  He is able to do his ADLs without any help.  He goes to shopping and take bus ride.  He has a son who lives in [place] and other son lives in [place].  Patient is very well aware about his chronic condition including nephrectomy and A. fib.  He was able to name his cardiologist and primary care physician.  Patient admitted that sometimes he gets sad and depressed and he takes Zoloft and trazodone.  He's been sleeping okay.  His memory is good.  Patient is not aggressive, violent, agitated or irritable.  He admitted to losing weight and past few years because of loss of appetite.  However he denies any crying spells, suicidal thoughts or any mania.  Patient denies drinking or using any drugs.  He denies any OCD symptoms, panic attacks, phobia or any PTSD symptoms.  Past Psychiatric History: Patient denies any history of psychiatric inpatient  treatment or any suicidal attempt.  In the past he used to take Lexapro by his primary care physician which was changed to Zoloft after it stopped working.  He is taking his medication from his primary care physician.  Risk to Self: Is patient at risk for  suicide?: No Risk to Others:   Prior Inpatient Therapy:   Prior Outpatient Therapy:    Past Medical History:  Past Medical History:  Diagnosis Date  . AAA (abdominal aortic aneurysm) (HCC)    9.1 cm repair 05/2009 ( ENDOVASCULAR REPAIR ).  MORE RECENT RE-EVALUATION OF ENLARGING AAA BY DR. Trula Slade AND BY DR. Kathlene Cote  MAY 2015 - PT STATES HE UNDERSTANDS THAT NOTHING IS LEAKING AT PRESENT TIME AND HE IS TO FOLLOW UP WITH HIS DOCTORS FOR FUTURE MONITORING I  . Arthritis    LEFT HAND, RT KNEE (08/14/2016)  . Basal cell carcinoma    "right side of my nose; think they burned it off" (08/14/2016)  . CAD (coronary artery disease)    a. s/p PCI to LAD/diagonal, subtotal of PDA 2006. b. Negative nuc 2014.  Marland Kitchen CHF (congestive heart failure) ([place]) 08/14/2016  . Chronic atrial fibrillation ([place])   . CKD (chronic kidney disease), stage III    a. stage III-IV, Cr baseline appears 1.8-2.0  . COPD (chronic obstructive pulmonary disease) ([place])   . Depression   . GERD (gastroesophageal reflux disease)   . History of kidney stones   . History of pneumothorax    LONG TIME AGO  . Hyperlipidemia   . Hypertension   . Migraine    "very rare the last 20 years" (08/14/2016)  . Myocardial infarction ([place]) 2006  . Osteoarthritis   . Renal cell carcinoma ([place])   . Shingles outbreak    PT NOTICED SKIN RASH RT GROIN AND RIGHT TRUNK ON MONDAY 10/27/13 - NOT A LOT OF DISCOMFORT- NOT ON ANY ORAL MEDS TO TREAT.  . Sleep apnea    "dx'd; didn't try mask" (08/14/2016)  . Stroke [facility])    "CT showed I've had old strokes; never knew about it before today" (08/14/2016)    Past Surgical History:  Procedure Laterality Date  . ABDOMINAL AORTAGRAM N/A 09/24/2013   Procedure: ABDOMINAL Maxcine [place];  Surgeon: Serafina Mitchell, MD;  Location: [facility] CATH LAB;  Service: Cardiovascular;  Laterality: N/A;  . ABDOMINAL AORTIC ANEURYSM REPAIR  JAN 2011   ENDOVASCULAR - DR. BRABHAM  . CARDIAC CATHETERIZATION  07/04/2004   Archie Endo 07/04/2004  .  COLONOSCOPY  early 1990s   /notes 11/11/2008  . CORONARY ANGIOPLASTY WITH STENT PLACEMENT  07/05/2004   notes 07/05/2004  . KNEE ARTHROSCOPY Right   . LITHOTRIPSY    . ROBOT ASSISTED LAPAROSCOPIC NEPHRECTOMY Left 11/06/2013   Procedure: ROBOTIC ASSISTED LAPAROSCOPIC RADICAL  NEPHRECTOMY;  Surgeon: Alexis Frock, MD;  Location: [facility] ORS;  Service: Urology;  Laterality: Left;  . TONSILLECTOMY  1964   Family History:  Family History  Problem Relation Age of Onset  . Pulmonary embolism Mother        died of PE  . Heart disease Mother        before age 73  . Anuerysm Father        history of popliteal  . Coronary artery disease Unknown   . Hyperlipidemia Unknown   . Hypertension Unknown   . Prostate cancer Unknown   . Other Neg Hx        polycystic kidney disease, and colon cancer  . Colon cancer Neg  Hx    Family Psychiatric  History: Patient endorse sister has depression. Social History:  History  Alcohol Use  . 1.5 oz/week  . 3 Standard drinks or equivalent per week    Comment: 08/14/2016 "maybe 2-3 drinks/month average"     History  Drug Use No    Social History   Social History  . Marital status: Divorced    Spouse name: N/A  . Number of children: 2  . Years of education: N/A   Occupational History  .  Retired   Social History Main Topics  . Smoking status: Former Smoker    Packs/day: 1.50    Years: 23.00    Types: Cigarettes    Quit date: 08/24/1980  . Smokeless tobacco: Never Used  . Alcohol use 1.5 oz/week    3 Standard drinks or equivalent per week     Comment: 08/14/2016 "maybe 2-3 drinks/month average"  . Drug use: No  . Sexual activity: Not Asked   Other Topics Concern  . None   Social History Narrative   Retired   Divorced   2 sons 64 &30     Occupation:  Part time sports broadcaster    Smoking Status:  quit (08/24/1980)   Packs/Day:  1.0    Caffeine use/day:  3 beverages daily    Does Patient Exercise:  no   Alcohol Use - yes   Additional Social  History:    Allergies:   Allergies  Allergen Reactions  . Ace Inhibitors Cough    Labs:  Results for orders placed or performed during the hospital encounter of 10/06/16 (from the past 48 hour(s))  Urinalysis, Routine w reflex microscopic     Status: Abnormal   Collection Time: 10/06/16  7:43 AM  Result Value Ref Range   Color, Urine YELLOW YELLOW   APPearance CLEAR CLEAR   Specific Gravity, Urine 1.013 1.005 - 1.030   pH 5.0 5.0 - 8.0   Glucose, UA NEGATIVE NEGATIVE mg/dL   Hgb urine dipstick NEGATIVE NEGATIVE   Bilirubin Urine NEGATIVE NEGATIVE   Ketones, ur NEGATIVE NEGATIVE mg/dL   Protein, ur 30 (A) NEGATIVE mg/dL   Nitrite NEGATIVE NEGATIVE   Leukocytes, UA TRACE (A) NEGATIVE   RBC / HPF 0-5 0 - 5 RBC/hpf   WBC, UA 0-5 0 - 5 WBC/hpf   Bacteria, UA NONE SEEN NONE SEEN   Squamous Epithelial / LPF 0-5 (A) NONE SEEN   Mucous PRESENT    Hyaline Casts, UA PRESENT   Rapid urine drug screen (hospital performed)     Status: None   Collection Time: 10/06/16  7:43 AM  Result Value Ref Range   Opiates NONE DETECTED NONE DETECTED   Cocaine NONE DETECTED NONE DETECTED   Benzodiazepines NONE DETECTED NONE DETECTED   Amphetamines NONE DETECTED NONE DETECTED   Tetrahydrocannabinol NONE DETECTED NONE DETECTED   Barbiturates NONE DETECTED NONE DETECTED    Comment:        DRUG SCREEN FOR MEDICAL PURPOSES ONLY.  IF CONFIRMATION IS NEEDED FOR ANY PURPOSE, NOTIFY LAB WITHIN 5 DAYS.        LOWEST DETECTABLE LIMITS FOR URINE DRUG SCREEN Drug Class       Cutoff (ng/mL) Amphetamine      1000 Barbiturate      200 Benzodiazepine   485 Tricyclics       462 Opiates          300 Cocaine          300 THC  50   Comprehensive metabolic panel     Status: Abnormal   Collection Time: 10/06/16  7:45 AM  Result Value Ref Range   Sodium 140 135 - 145 mmol/L   Potassium 3.5 3.5 - 5.1 mmol/L   Chloride 105 101 - 111 mmol/L   CO2 23 22 - 32 mmol/L   Glucose, Bld 108 (H) 65 - 99  mg/dL   BUN 60 (H) 6 - 20 mg/dL   Creatinine, Ser 3.08 (H) 0.61 - 1.24 mg/dL   Calcium 8.7 (L) 8.9 - 10.3 mg/dL   Total Protein 6.1 (L) 6.5 - 8.1 g/dL   Albumin 3.3 (L) 3.5 - 5.0 g/dL   AST 37 15 - 41 U/L   ALT 74 (H) 17 - 63 U/L   Alkaline Phosphatase 67 38 - 126 U/L   Total Bilirubin 1.9 (H) 0.3 - 1.2 mg/dL   GFR calc non Af Amer 19 (L) >60 mL/min   GFR calc Af Amer 22 (L) >60 mL/min    Comment: (NOTE) The eGFR has been calculated using the CKD EPI equation. This calculation has not been validated in all clinical situations. eGFR's persistently <60 mL/min signify possible Chronic Kidney Disease.    Anion gap 12 5 - 15  CBC     Status: Abnormal   Collection Time: 10/06/16  7:45 AM  Result Value Ref Range   WBC 6.0 4.0 - 10.5 K/uL   RBC 3.88 (L) 4.22 - 5.81 MIL/uL   Hemoglobin 11.6 (L) 13.0 - 17.0 g/dL   HCT 36.4 (L) 39.0 - 52.0 %   MCV 93.8 78.0 - 100.0 fL   MCH 29.9 26.0 - 34.0 pg   MCHC 31.9 30.0 - 36.0 g/dL   RDW 17.4 (H) 11.5 - 15.5 %   Platelets 121 (L) 150 - 400 K/uL  Protime-INR     Status: Abnormal   Collection Time: 10/06/16  7:45 AM  Result Value Ref Range   Prothrombin Time 22.5 (H) 11.4 - 15.2 seconds   INR 1.94   Ethanol     Status: None   Collection Time: 10/06/16  7:45 AM  Result Value Ref Range   Alcohol, Ethyl (B) <5 <5 mg/dL    Comment:        LOWEST DETECTABLE LIMIT FOR SERUM ALCOHOL IS 5 mg/dL FOR MEDICAL PURPOSES ONLY   CBG monitoring, ED     Status: Abnormal   Collection Time: 10/06/16  8:02 AM  Result Value Ref Range   Glucose-Capillary 109 (H) 65 - 99 mg/dL  Magnesium     Status: None   Collection Time: 10/06/16  6:59 PM  Result Value Ref Range   Magnesium 2.3 1.7 - 2.4 mg/dL  Basic metabolic panel     Status: Abnormal   Collection Time: 10/07/16  8:19 AM  Result Value Ref Range   Sodium 141 135 - 145 mmol/L   Potassium 4.2 3.5 - 5.1 mmol/L   Chloride 109 101 - 111 mmol/L   CO2 25 22 - 32 mmol/L   Glucose, Bld 130 (H) 65 - 99 mg/dL    BUN 56 (H) 6 - 20 mg/dL   Creatinine, Ser 2.55 (H) 0.61 - 1.24 mg/dL   Calcium 8.6 (L) 8.9 - 10.3 mg/dL   GFR calc non Af Amer 23 (L) >60 mL/min   GFR calc Af Amer 27 (L) >60 mL/min    Comment: (NOTE) The eGFR has been calculated using the CKD EPI equation. This calculation has not been validated  in all clinical situations. eGFR's persistently <60 mL/min signify possible Chronic Kidney Disease.    Anion gap 7 5 - 15  CBC     Status: Abnormal   Collection Time: 10/07/16  8:19 AM  Result Value Ref Range   WBC 5.0 4.0 - 10.5 K/uL   RBC 3.76 (L) 4.22 - 5.81 MIL/uL   Hemoglobin 11.1 (L) 13.0 - 17.0 g/dL   HCT 35.3 (L) 39.0 - 52.0 %   MCV 93.9 78.0 - 100.0 fL   MCH 29.5 26.0 - 34.0 pg   MCHC 31.4 30.0 - 36.0 g/dL   RDW 17.3 (H) 11.5 - 15.5 %   Platelets 107 (L) 150 - 400 K/uL    Comment: RESULT REPEATED AND VERIFIED SPECIMEN CHECKED FOR CLOTS CONSISTENT WITH PREVIOUS RESULT   Protime-INR     Status: Abnormal   Collection Time: 10/07/16  8:19 AM  Result Value Ref Range   Prothrombin Time 26.7 (H) 11.4 - 15.2 seconds   INR 2.41     Current Facility-Administered Medications  Medication Dose Route Frequency Provider Last Rate Last Dose  . 0.9 %  sodium chloride infusion   Intravenous Continuous Janece Canterbury, MD 75 mL/hr at 10/06/16 1856    . acetaminophen (TYLENOL) tablet 650 mg  650 mg Oral Q6H PRN Short, Noah Delaine, MD       Or  . acetaminophen (TYLENOL) suppository 650 mg  650 mg Rectal Q6H PRN Short, Mackenzie, MD      . albuterol (PROVENTIL) (2.5 MG/3ML) 0.083% nebulizer solution 3 mL  3 mL Inhalation Q6H PRN Short, Mackenzie, MD      . atorvastatin (LIPITOR) tablet 40 mg  40 mg Oral Daily Janece Canterbury, MD   40 mg at 10/07/16 0921  . bisacodyl (DULCOLAX) EC tablet 5 mg  5 mg Oral Daily PRN Short, Mackenzie, MD      . carvedilol (COREG) tablet 25 mg  25 mg Oral BID WC Janece Canterbury, MD   25 mg at 10/07/16 0921  . diltiazem (CARDIZEM) tablet 30 mg  30 mg Oral Q6H  Janece Canterbury, MD   30 mg at 10/07/16 0649  . famotidine (PEPCID) tablet 20 mg  20 mg Oral BID Janece Canterbury, MD   20 mg at 10/07/16 0921  . feeding supplement (ENSURE ENLIVE) (ENSURE ENLIVE) liquid 237 mL  237 mL Oral Q24H Janece Canterbury, MD   237 mL at 10/06/16 1800  . isosorbide dinitrate (ISORDIL) tablet 20 mg  20 mg Oral TID Janece Canterbury, MD   20 mg at 10/07/16 0921  . metoprolol tartrate (LOPRESSOR) injection 5 mg  5 mg Intravenous Q6H PRN Short, Mackenzie, MD      . polyethylene glycol (MIRALAX / GLYCOLAX) packet 17 g  17 g Oral Daily PRN Short, Mackenzie, MD      . sertraline (ZOLOFT) tablet 100 mg  100 mg Oral QPM Janece Canterbury, MD   100 mg at 10/06/16 2312  . sodium chloride flush (NS) 0.9 % injection 3 mL  3 mL Intravenous Betsy Pries, MD   3 mL at 10/06/16 2313  . tamsulosin (FLOMAX) capsule 0.4 mg  0.4 mg Oral QPC breakfast Short, Mackenzie, MD   0.4 mg at 10/07/16 0920  . traZODone (DESYREL) tablet 50 mg  50 mg Oral Hettie Holstein, MD   50 mg at 10/06/16 2312  . warfarin (COUMADIN) tablet 2.5 mg  2.5 mg Oral ONCE-1800 Shade, Haze Justin, RPH      . Warfarin - Pharmacist  Dosing Inpatient   Does not apply q1800 Royetta Asal, [facility]        Musculoskeletal: Strength & Muscle Tone: within normal limits Gait & Station: Patient lying on the floor Patient leans: See above  Psychiatric Specialty Exam: See physical upon admission for details. Physical Exam  Constitutional: He appears well-developed.    Review of Systems  Constitutional: Positive for weight loss.       Tired  HENT: Negative.   Respiratory: Positive for shortness of breath.   Cardiovascular: Negative.   Skin: Negative.   Neurological: Negative for dizziness, tingling, tremors and headaches.  Psychiatric/Behavioral: Negative for depression and suicidal ideas.    Blood pressure (!) 123/94, pulse (!) 118, temperature 97.8 F (36.6 C), temperature source Oral, resp. rate 18, height 5'  11" (1.803 m), weight 96.2 kg (212 lb), SpO2 93 %.Body mass index is 29.57 kg/m.  General Appearance: Casual  Eye Contact:  Good  Speech:  Clear and Coherent  Volume:  Decreased  Mood:  Euthymic  Affect:  Appropriate and Congruent  Thought Process:  Goal Directed  Orientation:  Full (Time, Place, and Person)  Thought Content:  WDL and Logical  Suicidal Thoughts:  No  Homicidal Thoughts:  No  Memory:  Immediate;   Good Recent;   Fair Remote;   Fair  Judgement:  Fair  Insight:  Fair  Psychomotor Activity:  Normal  Concentration:  Attention Span: Fair  Recall:  Good  Fund of Knowledge:  Good  Language:  Fair  Akathisia:  No  Handed:  Right  AIMS (if indicated):     Assets:  Communication Skills Desire for Improvement Housing Resilience Social Support  ADL's:  Intact  Cognition:  Impaired,  Mild  Sleep:        Treatment Plan Summary: Patient is 76 year old who was admitted due to confusion.  He has multiple health issues. he recover significantly and not relevant provide information. Patient does have capacity to make his own medical decision and living arrangement.  He realize that if he is unable to take care of himself then he will think about assisted living facility.  He had a good support network including his sister and niece.  Appreciate psychiatry consult and we will sign off as of today.  If needed any help please contact [phone] for further assistance.  Disposition: No evidence of imminent risk to self or others at present.   Supportive therapy provided about ongoing stressors.  Stanley Lyness T., MD 10/07/2016 11:19 AM

## 2016-10-07 NOTE — Care Management Note (Signed)
Case Management Note  Patient Details  Name: Mark [name] MRN: [id number] Date of Birth: 08-16-1940  Subjective/Objective:    Subacute delirium, afib, AKI                Action/Plan: Discharge Planning:  NCM spoke to pt and offered choice for HH/list provided. Pt states he has his oxygen with [facility]. Requested [facility] for [facility] PT, RN and SW. [facility] rep with new referral. Pt has portable oxygen in room to dc home. Pt states he lives alone but has neighbors that can assist him at home.   PCP [name]  Expected Discharge Date:  10/07/16               Expected Discharge Plan:  [place]  In-House Referral:  NA  Discharge planning Services  CM Consult  Post Acute Care Choice:  Home Health Choice offered to:  Patient  DME Arranged:  N/A DME Agency:  NA  HH Arranged:  RN, PT, Social Work [employer] Agency:  [place]  Status of Service:  Completed, signed off  If discussed at [employer], dates discussed:    Additional Comments:  Erenest Rasher, RN 10/07/2016, 5:38 PM

## 2016-10-07 NOTE — Discharge Summary (Signed)
Physician Discharge Summary  Mark [name] HWE:[id number] DOB: 06-Jul-1940 DOA: 10/06/2016  PCP: [name], NP  Admit date: 10/06/2016 Discharge date: 10/07/2016  Time spent: 30 minutes  Recommendations for Outpatient Follow-up:  1. Repeat BMET to follow electrolytes and renal function 2. Please reassess patient medical literacy, home environment and if needed arrange for ALF. Found with capacity by psychiatry to take his own medical decisions. 3. Reassess BP and adjust antihypertensive regimen    Discharge Diagnoses:  Principal Problem:   Subacute delirium Active Problems:   Atrial fibrillation, chronic (HCC)   AKI (acute kidney injury) ([place])   Cerebrovascular accident (CVA) ([place]) Physical deconditioning GERD Prolonged QT [place] HLD  Discharge Condition: stable and improved. Discharge home with home health services. Follow up with PCP in 1 week.  Diet recommendation: heart healthy diet   Filed Weights   10/06/16 0722 10/06/16 1745 10/07/16 0428  Weight: 100.2 kg (221 lb) 100.2 kg (221 lb) 96.2 kg (212 lb)    History of present illness:  As per H&P written by Dr. Sheran [name] on 10/06/16 76 y.o. male with history of AAA, atrial fibrillation, coronary artery disease, CKD, GERD, renal cell carcinoma status post nephrectomy, hypertension, dementia who presents with confusion, slurred speech.  History obtained from records as patient is confused and unable to provide history.  He was hospitalized in April of this year for confusion and bizarre behaviors at which time he underwent CT head, MRI brain, EEG which were consistent with dementia.  Case was discussed with Neurology and Psychiatry who felt he had dementia but did have capacity to make decisions about disposition and medical care.  He was discharged to home.  Since discharge, he has not reliably followed up for all of his appointments.  He was found by a neighbor on the morning of admission sleeping outside of his apartment.  He was  slurring his words and confused.  EMS was called and he was transported to the ER.  No obvious facial droop or focal weakness was noted.  His oxygen was found to not be hooked up and his O2 sat was 89%.  Patient denies focal weakness or numbness, fevers, cough, vomiting, diarrhea, dysuria.  He does not remember eating or drinking for the last several days and cannot remember when he last took any of his medications.    ED Course:  Vital signs notable for O2 sat 89%, afebrile.  HR in the 110-120 in atrial fibrillation.  Labs notable for INR of 1.94.  Creatinine is 3.08 up from baseline of 1.8.  CXR and UA unremarkable. CT head demonstrated evidence of atrophy/volume loss but no obvious stroke.  He continued to have intermittent slurred speech in the ER and is at risk for stroke due to a-fib with subtherapeutic INR.  Persistently confused.     Hospital Course:  Acute encephalopathy with slurred speech superimposed on underlying dementia with bizarre behaviors, hallucinations.  No obvious underlying infection.  May be dehydrated but also at risk for stroke given atrial fibrillation -CT head and MRI w/o acute abnormalities -will continue current treatment for secondary prevention (warfarin, INR therapeutic at discharge) -no signs of infection and mentation improved/resolved after rehydration; must likely  Acute on chronic kidney disease, stage III, creatinine 3.08, baseline 1.8-2.2 -improved with IVF's -continue minimizing nephrotoxic agents -patient Cr down to 2.5 at discharge -will recommend BMET at follow to assess renal function trend    Atrial fibrillation with RVR, and mildly subtherapeutic INR -Resume carvedilol -continue coumadin for secondary  prevention -patient electrolytes WNL  -INR at discharge 2.45  Prolonged QTc - Avoid QTc prolonging medications - electrolytes repleted and WNL at discharge  Chronic respiratory failure,  -stable and using 2.5-3L [place] as previously  prescribed/instructed -patient normal O2 saturation on her [place] supplementation.  Chronic systolic heart failure with LBBB, moderate MR -Previous echo w/ nml EF and evidence of diastolic chf. -condition is compensated and stable overall -will continue home medication regimen.  Essential hypertension, blood pressures stable -continue Coreg, isosorbide and hydralazine -advise to follow heart healthy diet   BPH, stable -continue flomax  GERD, stable -continue pepcid  Depression, stable -continue zoloft  Hyperlipidemia, stable -Continue lipitor  AAA -will recommend outpatient follow up. -Pt due for his yearly appt and imaging.   Social issues. Patient has a complex social/home situation. -seen by psychiatry service  -patient has been cleared for discharge to his own environment and found to have capacity for medical decisions.   Procedures:  See below for x-ray reports   Consultations:  Psychiatry   Discharge Exam: Vitals:   10/07/16 0428 10/07/16 1527  BP: (!) 123/94 106/79  Pulse: (!) 118 (!) 104  Resp: 18 16  Temp: 97.8 F (36.6 C)     General: afebrile, oriented X3, denies CP and SOB. Patient without speech difficulties and tolerating diet. Inquiring about home discharge and clear by psychiatrist for discharge. Cardiovascular: irregular, S1 and S2, no rubs, no gallops Respiratory: good air movement, no wheezing, no crackles Abd: soft, NT, ND, positive BS Extremities: no edema, no cyanosis  Neuro: no focal deficit appreciated.  Discharge Instructions   Discharge Instructions    Diet - low sodium heart healthy    Complete by:  As directed    Discharge instructions    Complete by:  As directed    Take medications as prescribed  Arrange follow up with PCP in 1 week Keep yourself well hydrated  Follow heart healthy diet     Current Discharge Medication List    CONTINUE these medications which have CHANGED   Details  furosemide (LASIX) 20  MG tablet Take 3 tablets (60 mg total) by mouth daily.      CONTINUE these medications which have NOT CHANGED   Details  acetaminophen (TYLENOL) 500 MG tablet Take 1,000 mg by mouth every 6 (six) hours as needed (For pain.).    albuterol (PROVENTIL HFA;VENTOLIN HFA) 108 (90 Base) MCG/ACT inhaler Inhale 2 puffs into the lungs every 6 (six) hours as needed for wheezing or shortness of breath. Qty: 1 Inhaler, Refills: 0   Associated Diagnoses: Dyspnea on exertion    atorvastatin (LIPITOR) 40 MG tablet TAKE 1 TABLET ONE TIME DAILY Qty: 90 tablet, Refills: 1    carvedilol (COREG) 25 MG tablet Take 1 tablet (25 mg total) by mouth 2 (two) times daily with a meal. Qty: 60 tablet, Refills: 0    Coenzyme Q10 200 MG capsule Take 200 mg by mouth daily.    feeding supplement, ENSURE ENLIVE, (ENSURE ENLIVE) LIQD Take 237 mLs by mouth daily. Qty: 237 mL, Refills: 12    hydrALAZINE (APRESOLINE) 10 MG tablet Take 1 tablet (10 mg total) by mouth 3 (three) times daily. Qty: 270 tablet, Refills: 3    isosorbide dinitrate (ISORDIL) 20 MG tablet Take 1 tablet (20 mg total) by mouth 3 (three) times daily. Qty: 270 tablet, Refills: 1    Multiple Vitamins-Minerals (MACUVITE EYE CARE PO) Take 1 capsule by mouth daily.    Omega-3 Fatty Acids (FISH  OIL) 1200 MG CAPS Take 1,200 mg by mouth daily.     potassium chloride SA (K-DUR,KLOR-CON) 20 MEQ tablet Take 1 tablet (20 mEq total) by mouth every evening. Qty: 30 tablet, Refills: 0    ranitidine (ZANTAC) 150 MG tablet Take 1 tablet (150 mg total) by mouth 2 (two) times daily. Qty: 180 tablet, Refills: 1    sertraline (ZOLOFT) 100 MG tablet Take 1 tablet (100 mg total) by mouth every evening. Qty: 90 tablet, Refills: 1    tamsulosin (FLOMAX) 0.4 MG CAPS capsule TAKE 1 CAPSULE (0.4 MG TOTAL) DAILY AFTER BREAKFAST. Qty: 90 capsule, Refills: 1    traZODone (DESYREL) 50 MG tablet Take 1 tablet (50 mg total) by mouth at bedtime. Qty: 30 tablet, Refills: 0     warfarin (COUMADIN) 5 MG tablet TAKE 1 TABLET EVERY DAY EXCEPT 1.5 TABLETS ON WEDNESDAY. Qty: 90 tablet, Refills: 1      STOP taking these medications     hydrocortisone cream 1 %        Allergies  Allergen Reactions  . Ace Inhibitors Cough   Follow-up Information    [name], NP. Schedule an appointment as soon as possible for a visit in 1 week(s).   Specialty:  Internal Medicine Contact information: [address] [address] [phone]           The results of significant diagnostics from this hospitalization (including imaging, microbiology, ancillary and laboratory) are listed below for reference.    Significant Diagnostic Studies: Dg Chest 2 View  Result Date: 10/06/2016 CLINICAL DATA:  Cough and congestion for 2 weeks.  Chest pain. EXAM: CHEST  2 VIEW COMPARISON:  Sep 15, 2016 and September 01, 2016 FINDINGS: There is stable interstitial prominence, most notably in the left upper lobe and both bases, likely due to underlying fibrotic type change. There is no appreciable edema or consolidation. Heart is borderline prominent with pulmonary vascularity within normal limits. No evident adenopathy. There is degenerative change in the thoracic spine with diffuse idiopathic skeletal hyperostosis. IMPRESSION: Areas of underlying scarring/ fibrotic change without frank edema or consolidation. Stable cardiac prominence. 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 No evident adenopathy. There is no appreciable change compared to recent studies. Electronically Signed   By: Lowella Grip III M.D.   On: 10/06/2016 08:00   Dg Chest 2 View  Result Date: 09/15/2016 CLINICAL DATA:  Cough and congestion for 2 weeks.  Ex smoker. EXAM: CHEST  2 VIEW COMPARISON:  09/01/2016 FINDINGS: Cardiac silhouette is mildly enlarged. No mediastinal or hilar masses. No convincing adenopathy. Lungs are hyperexpanded. There are small  pleural effusions, larger on the right. Prominent bronchovascular markings are noted bilaterally without overt pulmonary edema. No evidence of pneumonia. No pneumothorax. Skeletal structures are demineralized but grossly intact. IMPRESSION: 1. Similar appearance to most recent prior chest radiographs. 2. Small pleural effusions without says pulmonary edema. No evidence of pneumonia. 3. Mild stable cardiomegaly. Electronically Signed   By: Lajean Manes M.D.   On: 09/15/2016 12:08   Ct Head Wo Contrast  Result Date: 10/06/2016 CLINICAL DATA:  Slurred speech and altered mental status. Short-term memory loss. EXAM: CT HEAD WITHOUT CONTRAST TECHNIQUE: Contiguous axial images were obtained from the base of the skull through the vertex without intravenous contrast. COMPARISON:  August 14, 2016 head CT and brain MRI August 14, 2016 FINDINGS: Brain: There is age related volume loss. There is no intracranial mass, hemorrhage, extra-axial fluid  collection, or midline shift. There is small vessel disease throughout much of the centra semiovale bilaterally, stable. There is small vessel disease in each caudate nucleus region as well as in the anterior limbs of each internal capsule, stable. There is no new gray-white compartment lesion. No acute infarct evident. Vascular: No hyperdense vessel. There is calcification in each carotid siphon region. Skull: Bony calvarium appears intact. Sinuses/Orbits: There is a a retention cyst, incompletely visualized, in the anterior left maxillary antrum with associated mucosal thickening. There is mucosal thickening in several ethmoid air cells bilaterally. Frontal sinuses are hypoplastic. Visualized paranasal sinuses elsewhere clear. Orbits appear symmetric bilaterally. Other: Mastoid air cells are clear. IMPRESSION: Fairly extensive supratentorial small vessel disease remain stable. There is age related volume loss. No intracranial mass, hemorrhage, or extra-axial fluid collection. No acute  appearing infarct. Areas of vascular calcification noted. Areas of paranasal sinus disease as summarized above. Electronically Signed   By: Lowella Grip III M.D.   On: 10/06/2016 08:18   Mr Brain Wo Contrast  Result Date: 10/06/2016 CLINICAL DATA:  76 year old male with confusion and slurred speech today. Possible dementia. EXAM: MRI HEAD WITHOUT CONTRAST TECHNIQUE: Multiplanar, multiecho pulse sequences of the brain and surrounding structures were obtained without intravenous contrast. COMPARISON:  Head CT without contrast 0806 hours today. Brain MRI 08/14/2016. FINDINGS: Brain: No restricted diffusion to suggest acute infarction. No midline shift, mass effect, evidence of mass lesion, ventriculomegaly, extra-axial collection or acute intracranial hemorrhage. Cervicomedullary junction and pituitary are within normal limits. Confluent bilateral cerebral white matter T2 and FLAIR hyperintensity with several small areas which most resemble chronic white matter lacunar infarcts. Similar widespread T2 heterogeneity throughout the deep gray matter nuclei, the midbrain and pons, in part related to perivascular spaces. No new signal abnormality identified. Occasional chronic micro hemorrhages in the brain ir stable (right parietal lobe, left posterior temporal lobe). No new signal abnormality identified. Vascular: Major intracranial vascular flow voids are stable with generalized intracranial artery dolichoectasia. Skull and upper cervical spine: Negative. Normal bone marrow signal. Sinuses/Orbits: Normal orbits soft tissues. Stable paranasal sinuses and mastoids, with occasional mucosal thickening and small mucous retention cysts. Other: Visible internal auditory structures appear normal. Negative scalp soft tissues. IMPRESSION: 1. Stable noncontrast MRI appearance of the brain since April with no acute intracranial abnormality. 2. Widespread signal changes again suggestive of advanced chronic small vessel  disease. Consider vascular causes of dementia. Electronically Signed   By: Genevie Ann M.D.   On: 10/06/2016 15:17    Microbiology: No results found for this or any previous visit (from the past 240 hour(s)).   Labs: Basic Metabolic Panel:  Recent Labs Lab 10/06/16 0745 10/06/16 1859 10/07/16 0819  NA 140  --  141  K 3.5  --  4.2  CL 105  --  109  CO2 23  --  25  GLUCOSE 108*  --  130*  BUN 60*  --  56*  CREATININE 3.08*  --  2.55*  CALCIUM 8.7*  --  8.6*  MG  --  2.3  --    Liver Function Tests:  Recent Labs Lab 10/06/16 0745  AST 37  ALT 74*  ALKPHOS 67  BILITOT 1.9*  PROT 6.1*  ALBUMIN 3.3*   CBC:  Recent Labs Lab 10/06/16 0745 10/07/16 0819  WBC 6.0 5.0  HGB 11.6* 11.1*  HCT 36.4* 35.3*  MCV 93.8 93.9  PLT 121* 107*   BNP (last 3 results)  Recent Labs  08/14/16 1047  BNP 864.3*   CBG:  Recent Labs Lab 10/06/16 0802  GLUCAP 109*    Signed:  Barton Dubois MD.  [facility] 10/07/2016, 4:08 PM

## 2016-10-07 NOTE — Evaluation (Signed)
Physical Therapy Evaluation Patient Details Name: Mark [name] MRN: [id number] DOB: 29-Jul-1940 Today's Date: 10/07/2016   History of Present Illness  Please see H& P for complete information for this admission and PMH. Pt lives alone in [place] and was found sleeping outside his door at his apartment. He was able to tell me the entire story of this. Home was questioned for safety insde and pt was found to have increased confusion and slurred speech. Now with AKI, afib.   Clinical Impression  Pt with generalized weakness per report when we went for a short walk in the hallway he stated "wow what 3 days down can do to you". O 2 sats were lower on continuous 4 L during our entire session, in %87 range at rest, improved to 92-97%  with prompted pursed lip breathing exercises and coughing. Feel pt may benefit from PT session while in acute care, may not need follow up for mobility after DC from here, will continue to assess.Continue to assess higher level balance for today's eval was limited to how much I could tax the patient for he was having series of Vtach on the monitor.      Follow Up Recommendations Home health PT (will see how pateint progresses for he may not need further PT for mobility if generalized weakness improves while here. Fairly steadyon his feet. )    Equipment Recommendations  None recommended by PT    Recommendations for Other Services       Precautions / Restrictions Precautions Precautions: Fall Restrictions Weight Bearing Restrictions: No      Mobility  Bed Mobility Overal bed mobility: Independent                Transfers Overall transfer level: Needs assistance Equipment used: None (he pushed and negotiated the IV pole ) Transfers: Sit to/from Stand Sit to Stand: Supervision         General transfer comment: I helped negotiate the O2 lines and tank and just stood by supervision/min guard.   Ambulation/Gait Ambulation/Gait assistance: Min  guard Ambulation Distance (Feet): 60 Feet Assistive device: None (pushed IV pole)       General Gait Details: basic , WNL gait pattern  Stairs            Wheelchair Mobility    Modified Rankin (Stroke Patients Only)       Balance                                             Pertinent Vitals/Pain Pain Assessment: No/denies pain    Home Living Family/patient expects to be discharged to:: Unsure (lives home alone but nsure of ability to care for himself safely. could AL be a possibility? ) Living Arrangements: Alone (son coming from out of town on Tuesday I hear. )                    Prior Function Level of Independence: Independent         Comments: Pt states he does not use assistive device, uses the bus, and at times was having trouble with his O2 portable tank running out, and he has lost his keys that is why he slept outside his apartment.      Hand Dominance   Dominant Hand: Right    Extremity/Trunk Assessment        Lower  Extremity Assessment Lower Extremity Assessment: Overall WFL for tasks assessed       Communication   Communication: No difficulties  Cognition Arousal/Alertness: Awake/alert Behavior During Therapy: WFL for tasks assessed/performed Overall Cognitive Status: Within Functional Limits for tasks assessed                                        General Comments      Exercises     Assessment/Plan    PT Assessment Patient needs continued PT services  PT Problem List Decreased activity tolerance       PT Treatment Interventions Gait training;Functional mobility training;Therapeutic activities;Therapeutic exercise;Patient/family education    PT Goals (Current goals can be found in the Care Plan section)  Acute Rehab PT Goals Patient Stated Goal: " I want to my heart out for an adventure"... pt is very witty, and likes to joke.  PT Goal Formulation: With patient Time For Goal  Achievement: 10/21/16 Potential to Achieve Goals: Good    Frequency Min 3X/week   Barriers to discharge        Co-evaluation               AM-PAC PT "6 Clicks" Daily Activity  Outcome Measure Difficulty turning over in bed (including adjusting bedclothes, sheets and blankets)?: None Difficulty moving from lying on back to sitting on the side of the bed? : None Difficulty sitting down on and standing up from a chair with arms (e.g., wheelchair, bedside commode, etc,.)?: None Help needed moving to and from a bed to chair (including a wheelchair)?: None Help needed walking in hospital room?: A Little Help needed climbing 3-5 steps with a railing? : A Little 6 Click Score: 22    End of Session Equipment Utilized During Treatment: Gait belt Activity Tolerance: Patient tolerated treatment well Patient left: in bed;with call bell/phone within reach;with bed alarm set Nurse Communication: Mobility status PT Visit Diagnosis: Repeated falls (R29.6) (exercises for breathing and O2 monitoring. Will continue to assess higher level balance, limited today due to several Vtach session. Made nurse and MD aware. )    Time: 8177-1165 PT Time Calculation (min) (ACUTE ONLY): 28 min   Charges:   PT Evaluation $PT Eval Low Complexity: 1 Procedure PT Treatments $Gait Training: 8-22 mins   PT G Codes:   PT G-Codes **NOT FOR INPATIENT CLASS** Functional Assessment Tool Used: AM-PAC 6 Clicks Basic Mobility;Clinical judgement Functional Limitation: Mobility: Walking and moving around Mobility: Walking and Moving Around Current Status (B9038): At least 1 percent but less than 20 percent impaired, limited or restricted Mobility: Walking and Moving Around Goal Status [phone]): 0 percent impaired, limited or restricted    Clide Dales, PT Pager: [phone] 10/07/2016   Sipriano Fendley, Gatha Mayer 10/07/2016, 12:00 PM

## 2016-10-07 NOTE — Care Management Obs Status (Signed)
[facility] NOTIFICATION   Patient Details  Name: Mark [name] MRN: [id number] Date of Birth: 1941-05-02   Medicare Observation Status Notification Given:  Yes    Erenest Rasher, RN 10/07/2016, 5:27 PM

## 2016-10-08 ENCOUNTER — Telehealth: Payer: Self-pay | Admitting: Family

## 2016-10-08 LAB — HEMOGLOBIN A1C
HEMOGLOBIN A1C: 5.8 % — AB (ref 4.8–5.6)
MEAN PLASMA GLUCOSE: 120 mg/dL

## 2016-10-08 NOTE — Progress Notes (Signed)
10/08/2016 7:11 pm NCM received call from Dr Delena Bali. He states he did not feel pt was safe to be at home alone. States he will check on him today. States he cleaned pt's home. He will contact son.   10/08/2016 1100 NCM spoke to pt's son, Dorance Spink. States he will be coming to town on Tuesday. Explained pt's, NP [name] wants him to contact office to arrange an appt. Son did speak to pt last night and will contact him this am. Will give NCM a call back. Provided son with NP contact number. Jonnie Finner RN CCM Case Mgmt phone [phone]

## 2016-10-08 NOTE — Telephone Encounter (Signed)
Please contact patient and arrange TCM follow up with me. I was told that his son will be coming into town this Tuesday.  I would really like to meet with his son as well if we could try to do the visit while the son is in town please.

## 2016-10-09 ENCOUNTER — Telehealth: Payer: Self-pay | Admitting: Behavioral Health

## 2016-10-09 ENCOUNTER — Telehealth: Payer: Self-pay | Admitting: Family

## 2016-10-09 NOTE — Telephone Encounter (Signed)
Caller name:Ryan  Relationship to patient:son Can be reached:[phone] Pharmacy:  Reason for call:Would like to personal speak with Lenna Sciara regarding his fathers health. Requesting call back

## 2016-10-09 NOTE — Telephone Encounter (Signed)
Spoke with patient's son. He is concerned that his dad is not safe living on his own and would like to discuss placement. We scheduled him to come in for office visit with his dad on 10/21/16 to further discuss.

## 2016-10-09 NOTE — Telephone Encounter (Signed)
lvm advising patient to call back and schedule appointment, will attempt to call back.

## 2016-10-09 NOTE — Telephone Encounter (Signed)
Unable to reach patient for TCM/Hospital Follow-up call. Left message for patient to return call when available.    

## 2016-10-09 NOTE — Telephone Encounter (Signed)
Tiffany-- can you help schedule this appointment this week with Melissa and let me know once you get him scheduled?

## 2016-10-10 NOTE — Telephone Encounter (Signed)
Attempted to follow-up with the patient regarding his most recent hospitalization, but the patient was unable to hear me on his end. Called the alternate phone number; no answer; left a second message for the patient.

## 2016-10-11 ENCOUNTER — Encounter (HOSPITAL_BASED_OUTPATIENT_CLINIC_OR_DEPARTMENT_OTHER): Payer: Self-pay | Admitting: *Deleted

## 2016-10-11 ENCOUNTER — Encounter: Payer: Self-pay | Admitting: Family

## 2016-10-11 ENCOUNTER — Inpatient Hospital Stay (HOSPITAL_BASED_OUTPATIENT_CLINIC_OR_DEPARTMENT_OTHER)
Admission: EM | Admit: 2016-10-11 | Discharge: 2016-10-26 | DRG: 377 | Disposition: A | Discharge status: Skilled Nursing Facility | Payer: Medicare HMO | Attending: Internal Medicine | Admitting: Internal Medicine

## 2016-10-11 ENCOUNTER — Ambulatory Visit (INDEPENDENT_AMBULATORY_CARE_PROVIDER_SITE_OTHER): Payer: Medicare HMO | Admitting: Family

## 2016-10-11 VITALS — BP 82/56 | HR 155 | Resp 20 | Ht 71.0 in

## 2016-10-11 DIAGNOSIS — I482 Chronic atrial fibrillation, unspecified: Secondary | ICD-10-CM | POA: Diagnosis present

## 2016-10-11 DIAGNOSIS — I2510 Atherosclerotic heart disease of native coronary artery without angina pectoris: Secondary | ICD-10-CM | POA: Diagnosis not present

## 2016-10-11 DIAGNOSIS — R001 Bradycardia, unspecified: Secondary | ICD-10-CM | POA: Diagnosis not present

## 2016-10-11 DIAGNOSIS — K264 Chronic or unspecified duodenal ulcer with hemorrhage: Principal | ICD-10-CM | POA: Diagnosis present

## 2016-10-11 DIAGNOSIS — E876 Hypokalemia: Secondary | ICD-10-CM | POA: Diagnosis present

## 2016-10-11 DIAGNOSIS — D62 Acute posthemorrhagic anemia: Secondary | ICD-10-CM | POA: Diagnosis not present

## 2016-10-11 DIAGNOSIS — I714 Abdominal aortic aneurysm, without rupture, unspecified: Secondary | ICD-10-CM

## 2016-10-11 DIAGNOSIS — R2689 Other abnormalities of gait and mobility: Secondary | ICD-10-CM | POA: Diagnosis not present

## 2016-10-11 DIAGNOSIS — J9621 Acute and chronic respiratory failure with hypoxia: Secondary | ICD-10-CM | POA: Diagnosis not present

## 2016-10-11 DIAGNOSIS — E785 Hyperlipidemia, unspecified: Secondary | ICD-10-CM | POA: Diagnosis present

## 2016-10-11 DIAGNOSIS — N179 Acute kidney failure, unspecified: Secondary | ICD-10-CM | POA: Diagnosis present

## 2016-10-11 DIAGNOSIS — K922 Gastrointestinal hemorrhage, unspecified: Secondary | ICD-10-CM | POA: Diagnosis not present

## 2016-10-11 DIAGNOSIS — Z9981 Dependence on supplemental oxygen: Secondary | ICD-10-CM

## 2016-10-11 DIAGNOSIS — K921 Melena: Secondary | ICD-10-CM | POA: Diagnosis not present

## 2016-10-11 DIAGNOSIS — Z8673 Personal history of transient ischemic attack (TIA), and cerebral infarction without residual deficits: Secondary | ICD-10-CM | POA: Diagnosis not present

## 2016-10-11 DIAGNOSIS — Z85528 Personal history of other malignant neoplasm of kidney: Secondary | ICD-10-CM

## 2016-10-11 DIAGNOSIS — I130 Hypertensive heart and chronic kidney disease with heart failure and stage 1 through stage 4 chronic kidney disease, or unspecified chronic kidney disease: Secondary | ICD-10-CM | POA: Diagnosis present

## 2016-10-11 DIAGNOSIS — J810 Acute pulmonary edema: Secondary | ICD-10-CM | POA: Diagnosis not present

## 2016-10-11 DIAGNOSIS — J45909 Unspecified asthma, uncomplicated: Secondary | ICD-10-CM | POA: Diagnosis present

## 2016-10-11 DIAGNOSIS — K449 Diaphragmatic hernia without obstruction or gangrene: Secondary | ICD-10-CM | POA: Diagnosis present

## 2016-10-11 DIAGNOSIS — K222 Esophageal obstruction: Secondary | ICD-10-CM | POA: Diagnosis present

## 2016-10-11 DIAGNOSIS — K219 Gastro-esophageal reflux disease without esophagitis: Secondary | ICD-10-CM | POA: Diagnosis present

## 2016-10-11 DIAGNOSIS — Z85828 Personal history of other malignant neoplasm of skin: Secondary | ICD-10-CM

## 2016-10-11 DIAGNOSIS — I5023 Acute on chronic systolic (congestive) heart failure: Secondary | ICD-10-CM | POA: Diagnosis not present

## 2016-10-11 DIAGNOSIS — M6281 Muscle weakness (generalized): Secondary | ICD-10-CM | POA: Diagnosis not present

## 2016-10-11 DIAGNOSIS — F0390 Unspecified dementia without behavioral disturbance: Secondary | ICD-10-CM | POA: Diagnosis not present

## 2016-10-11 DIAGNOSIS — I5022 Chronic systolic (congestive) heart failure: Secondary | ICD-10-CM | POA: Diagnosis not present

## 2016-10-11 DIAGNOSIS — Z87891 Personal history of nicotine dependence: Secondary | ICD-10-CM | POA: Diagnosis not present

## 2016-10-11 DIAGNOSIS — F329 Major depressive disorder, single episode, unspecified: Secondary | ICD-10-CM | POA: Diagnosis not present

## 2016-10-11 DIAGNOSIS — R0602 Shortness of breath: Secondary | ICD-10-CM | POA: Diagnosis not present

## 2016-10-11 DIAGNOSIS — I471 Supraventricular tachycardia: Secondary | ICD-10-CM | POA: Diagnosis not present

## 2016-10-11 DIAGNOSIS — R0603 Acute respiratory distress: Secondary | ICD-10-CM

## 2016-10-11 DIAGNOSIS — J811 Chronic pulmonary edema: Secondary | ICD-10-CM

## 2016-10-11 DIAGNOSIS — I10 Essential (primary) hypertension: Secondary | ICD-10-CM | POA: Diagnosis not present

## 2016-10-11 DIAGNOSIS — I5021 Acute systolic (congestive) heart failure: Secondary | ICD-10-CM | POA: Diagnosis not present

## 2016-10-11 DIAGNOSIS — F0150 Vascular dementia without behavioral disturbance: Secondary | ICD-10-CM | POA: Diagnosis present

## 2016-10-11 DIAGNOSIS — E784 Other hyperlipidemia: Secondary | ICD-10-CM | POA: Diagnosis not present

## 2016-10-11 DIAGNOSIS — D649 Anemia, unspecified: Secondary | ICD-10-CM | POA: Diagnosis not present

## 2016-10-11 DIAGNOSIS — I252 Old myocardial infarction: Secondary | ICD-10-CM | POA: Diagnosis not present

## 2016-10-11 DIAGNOSIS — I4891 Unspecified atrial fibrillation: Secondary | ICD-10-CM | POA: Diagnosis not present

## 2016-10-11 DIAGNOSIS — Z7901 Long term (current) use of anticoagulants: Secondary | ICD-10-CM

## 2016-10-11 DIAGNOSIS — N183 Chronic kidney disease, stage 3 (moderate): Secondary | ICD-10-CM | POA: Diagnosis present

## 2016-10-11 DIAGNOSIS — R488 Other symbolic dysfunctions: Secondary | ICD-10-CM | POA: Diagnosis not present

## 2016-10-11 DIAGNOSIS — Z955 Presence of coronary angioplasty implant and graft: Secondary | ICD-10-CM | POA: Diagnosis not present

## 2016-10-11 DIAGNOSIS — I672 Cerebral atherosclerosis: Secondary | ICD-10-CM | POA: Diagnosis present

## 2016-10-11 DIAGNOSIS — R1312 Dysphagia, oropharyngeal phase: Secondary | ICD-10-CM | POA: Diagnosis not present

## 2016-10-11 DIAGNOSIS — F32A Depression, unspecified: Secondary | ICD-10-CM | POA: Diagnosis present

## 2016-10-11 DIAGNOSIS — J449 Chronic obstructive pulmonary disease, unspecified: Secondary | ICD-10-CM | POA: Diagnosis present

## 2016-10-11 DIAGNOSIS — N189 Chronic kidney disease, unspecified: Secondary | ICD-10-CM

## 2016-10-11 DIAGNOSIS — D696 Thrombocytopenia, unspecified: Secondary | ICD-10-CM | POA: Diagnosis present

## 2016-10-11 DIAGNOSIS — J80 Acute respiratory distress syndrome: Secondary | ICD-10-CM | POA: Diagnosis not present

## 2016-10-11 DIAGNOSIS — N400 Enlarged prostate without lower urinary tract symptoms: Secondary | ICD-10-CM | POA: Diagnosis present

## 2016-10-11 DIAGNOSIS — F321 Major depressive disorder, single episode, moderate: Secondary | ICD-10-CM | POA: Diagnosis not present

## 2016-10-11 DIAGNOSIS — K269 Duodenal ulcer, unspecified as acute or chronic, without hemorrhage or perforation: Secondary | ICD-10-CM | POA: Diagnosis not present

## 2016-10-11 DIAGNOSIS — J90 Pleural effusion, not elsewhere classified: Secondary | ICD-10-CM | POA: Diagnosis not present

## 2016-10-11 DIAGNOSIS — I472 Ventricular tachycardia: Secondary | ICD-10-CM | POA: Diagnosis not present

## 2016-10-11 DIAGNOSIS — J9600 Acute respiratory failure, unspecified whether with hypoxia or hypercapnia: Secondary | ICD-10-CM | POA: Diagnosis not present

## 2016-10-11 HISTORY — DX: Chronic systolic (congestive) heart failure: I50.22

## 2016-10-11 LAB — COMPREHENSIVE METABOLIC PANEL
ALBUMIN: 2.7 g/dL — AB (ref 3.5–5.0)
ALK PHOS: 56 U/L (ref 38–126)
ALT: 26 U/L (ref 17–63)
AST: 24 U/L (ref 15–41)
Anion gap: 10 (ref 5–15)
BILIRUBIN TOTAL: 1.2 mg/dL (ref 0.3–1.2)
BUN: 85 mg/dL — ABNORMAL HIGH (ref 6–20)
CALCIUM: 8.4 mg/dL — AB (ref 8.9–10.3)
CO2: 20 mmol/L — ABNORMAL LOW (ref 22–32)
Chloride: 111 mmol/L (ref 101–111)
Creatinine, Ser: 2.15 mg/dL — ABNORMAL HIGH (ref 0.61–1.24)
GFR calc Af Amer: 33 mL/min — ABNORMAL LOW (ref >60)
GFR calc non Af Amer: 29 mL/min — ABNORMAL LOW (ref >60)
GLUCOSE: 112 mg/dL — AB (ref 65–99)
Potassium: 3.8 mmol/L (ref 3.5–5.1)
Sodium: 141 mmol/L (ref 135–145)
TOTAL PROTEIN: 5.4 g/dL — AB (ref 6.5–8.1)

## 2016-10-11 LAB — PROTIME-INR
INR: 2.24
INR: 1.45
PROTHROMBIN TIME: 17.8 s — AB (ref 11.4–15.2)
Prothrombin Time: 25.2 seconds — ABNORMAL HIGH (ref 11.4–15.2)

## 2016-10-11 LAB — CBC WITH DIFFERENTIAL/PLATELET
BASOS PCT: 0 %
Basophils Absolute: 0 10*3/uL (ref 0.0–0.1)
Eosinophils Absolute: 0.1 10*3/uL (ref 0.0–0.7)
Eosinophils Relative: 1 %
HEMATOCRIT: 26.3 % — AB (ref 39.0–52.0)
HEMOGLOBIN: 8.3 g/dL — AB (ref 13.0–17.0)
LYMPHS PCT: 14 %
Lymphs Abs: 1 10*3/uL (ref 0.7–4.0)
MCH: 30.4 pg (ref 26.0–34.0)
MCHC: 31.6 g/dL (ref 30.0–36.0)
MCV: 96.3 fL (ref 78.0–100.0)
MONOS PCT: 6 %
Monocytes Absolute: 0.4 10*3/uL (ref 0.1–1.0)
NEUTROS ABS: 5.6 10*3/uL (ref 1.7–7.7)
NEUTROS PCT: 79 %
Platelets: 138 10*3/uL — ABNORMAL LOW (ref 150–400)
RBC: 2.73 MIL/uL — ABNORMAL LOW (ref 4.22–5.81)
RDW: 17.5 % — ABNORMAL HIGH (ref 11.5–15.5)
WBC: 7.1 10*3/uL (ref 4.0–10.5)

## 2016-10-11 LAB — PREPARE RBC (CROSSMATCH)

## 2016-10-11 LAB — CBC
HCT: 23 % — ABNORMAL LOW (ref 39.0–52.0)
HEMOGLOBIN: 7.2 g/dL — AB (ref 13.0–17.0)
MCH: 29.5 pg (ref 26.0–34.0)
MCHC: 31.3 g/dL (ref 30.0–36.0)
MCV: 94.3 fL (ref 78.0–100.0)
PLATELETS: 121 10*3/uL — AB (ref 150–400)
RBC: 2.44 MIL/uL — AB (ref 4.22–5.81)
RDW: 17.9 % — ABNORMAL HIGH (ref 11.5–15.5)
WBC: 5.7 10*3/uL (ref 4.0–10.5)

## 2016-10-11 LAB — TROPONIN I
Troponin I: 0.12 ng/mL (ref <0.03)
Troponin I: 0.16 ng/mL (ref <0.03)

## 2016-10-11 LAB — MAGNESIUM: Magnesium: 2.2 mg/dL (ref 1.7–2.4)

## 2016-10-11 LAB — OCCULT BLOOD X 1 CARD TO LAB, STOOL: Fecal Occult Bld: POSITIVE — AB

## 2016-10-11 LAB — MRSA PCR SCREENING: MRSA BY PCR: NEGATIVE

## 2016-10-11 MED ORDER — ATORVASTATIN CALCIUM 40 MG PO TABS
40.0000 mg | ORAL_TABLET | Freq: Every day | ORAL | Status: DC
  Administered 2016-10-11 – 2016-10-26 (×16): 40 mg via ORAL
  Filled 2016-10-11 (×3): qty 1
  Filled 2016-10-11: qty 2
  Filled 2016-10-11 (×5): qty 1
  Filled 2016-10-11: qty 2
  Filled 2016-10-11 (×6): qty 1

## 2016-10-11 MED ORDER — EMPTY CONTAINERS FLEXIBLE MISC
25.0000 [IU]/kg | Status: DC
  Filled 2016-10-11: qty 95

## 2016-10-11 MED ORDER — SODIUM CHLORIDE 0.9 % IV SOLN
INTRAVENOUS | Status: DC
  Administered 2016-10-11 (×2): via INTRAVENOUS

## 2016-10-11 MED ORDER — ONDANSETRON HCL 4 MG/2ML IJ SOLN: 4.0000 mg | Freq: Four times a day (QID) | INTRAMUSCULAR | Status: DC | PRN

## 2016-10-11 MED ORDER — ISOSORBIDE DINITRATE 20 MG PO TABS
20.0000 mg | ORAL_TABLET | Freq: Three times a day (TID) | ORAL | Status: DC
  Administered 2016-10-11 – 2016-10-15 (×11): 20 mg via ORAL
  Filled 2016-10-11 (×13): qty 1

## 2016-10-11 MED ORDER — FENTANYL CITRATE (PF) 100 MCG/2ML IJ SOLN
INTRAMUSCULAR | Status: AC
  Filled 2016-10-11: qty 4

## 2016-10-11 MED ORDER — EPINEPHRINE PF 1 MG/10ML IJ SOSY
PREFILLED_SYRINGE | INTRAMUSCULAR | Status: AC
  Filled 2016-10-11: qty 10

## 2016-10-11 MED ORDER — SODIUM CHLORIDE 0.9 % IV BOLUS (SEPSIS)
500.0000 mL | Freq: Once | INTRAVENOUS | Status: AC
  Administered 2016-10-11: 500 mL via INTRAVENOUS

## 2016-10-11 MED ORDER — SODIUM CHLORIDE 0.9% FLUSH
3.0000 mL | Freq: Two times a day (BID) | INTRAVENOUS | Status: DC
  Administered 2016-10-11 – 2016-10-26 (×21): 3 mL via INTRAVENOUS

## 2016-10-11 MED ORDER — SERTRALINE HCL 100 MG PO TABS
100.0000 mg | ORAL_TABLET | Freq: Every evening | ORAL | Status: DC
  Administered 2016-10-11 – 2016-10-26 (×16): 100 mg via ORAL
  Filled 2016-10-11 (×16): qty 1

## 2016-10-11 MED ORDER — VITAMIN K1 10 MG/ML IJ SOLN
10.0000 mg | INTRAVENOUS | Status: AC
  Administered 2016-10-11: 10 mg via INTRAVENOUS
  Filled 2016-10-11: qty 1

## 2016-10-11 MED ORDER — SODIUM CHLORIDE 0.9 % IV SOLN
INTRAVENOUS | Status: DC
  Administered 2016-10-11: 16:00:00 via INTRAVENOUS

## 2016-10-11 MED ORDER — DIPHENHYDRAMINE HCL 50 MG/ML IJ SOLN
INTRAMUSCULAR | Status: AC
  Filled 2016-10-11: qty 1

## 2016-10-11 MED ORDER — ONDANSETRON HCL 4 MG PO TABS: 4.0000 mg | ORAL_TABLET | Freq: Four times a day (QID) | ORAL | Status: DC | PRN

## 2016-10-11 MED ORDER — ACETAMINOPHEN 500 MG PO TABS: 1000.0000 mg | ORAL_TABLET | Freq: Four times a day (QID) | ORAL | Status: DC | PRN

## 2016-10-11 MED ORDER — TAMSULOSIN HCL 0.4 MG PO CAPS
0.4000 mg | ORAL_CAPSULE | Freq: Every day | ORAL | Status: DC
  Administered 2016-10-12 – 2016-10-26 (×15): 0.4 mg via ORAL
  Filled 2016-10-11 (×15): qty 1

## 2016-10-11 MED ORDER — PANTOPRAZOLE SODIUM 40 MG PO TBEC
40.0000 mg | DELAYED_RELEASE_TABLET | Freq: Two times a day (BID) | ORAL | Status: DC
  Administered 2016-10-11: 40 mg via ORAL
  Filled 2016-10-11: qty 1

## 2016-10-11 MED ORDER — VITAMIN K1 10 MG/ML IJ SOLN
INTRAMUSCULAR | Status: AC
  Administered 2016-10-11: 15:00:00
  Filled 2016-10-11: qty 1

## 2016-10-11 MED ORDER — CARVEDILOL 6.25 MG PO TABS
6.2500 mg | ORAL_TABLET | Freq: Two times a day (BID) | ORAL | Status: DC
  Administered 2016-10-11 – 2016-10-13 (×5): 6.25 mg via ORAL
  Filled 2016-10-11 (×5): qty 1

## 2016-10-11 MED ORDER — LEVALBUTEROL HCL 0.63 MG/3ML IN NEBU: 0.6300 mg | INHALATION_SOLUTION | Freq: Four times a day (QID) | RESPIRATORY_TRACT | Status: DC | PRN

## 2016-10-11 MED ORDER — PANTOPRAZOLE SODIUM 40 MG IV SOLR
40.0000 mg | Freq: Once | INTRAVENOUS | Status: AC
  Administered 2016-10-11: 40 mg via INTRAVENOUS
  Filled 2016-10-11: qty 40

## 2016-10-11 MED ORDER — SODIUM CHLORIDE 0.9 % IV SOLN
8.0000 mg/h | INTRAVENOUS | Status: AC
  Administered 2016-10-12 – 2016-10-14 (×4): 8 mg/h via INTRAVENOUS
  Filled 2016-10-11 (×11): qty 80

## 2016-10-11 MED ORDER — SODIUM CHLORIDE 0.9 % IV SOLN
Freq: Once | INTRAVENOUS | Status: AC
  Administered 2016-10-11: 22:00:00 via INTRAVENOUS

## 2016-10-11 MED ORDER — DILTIAZEM HCL-DEXTROSE 100-5 MG/100ML-% IV SOLN (PREMIX)
5.0000 mg/h | INTRAVENOUS | Status: DC
  Administered 2016-10-11: 10 mg/h via INTRAVENOUS
  Administered 2016-10-11: 5 mg/h via INTRAVENOUS
  Administered 2016-10-11 – 2016-10-12 (×2): 10 mg/h via INTRAVENOUS
  Administered 2016-10-13: 5 mg/h via INTRAVENOUS
  Filled 2016-10-11 (×4): qty 100

## 2016-10-11 MED ORDER — PROTHROMBIN COMPLEX CONC HUMAN 500 UNITS IV KIT
2204.0000 [IU] | PACK | INTRAVENOUS | Status: AC
  Administered 2016-10-11: 2204 [IU] via INTRAVENOUS
  Filled 2016-10-11: qty 88

## 2016-10-11 MED ORDER — PROTHROMBIN COMPLEX CONC HUMAN 500 UNITS IV KIT
50.0000 [IU]/kg | PACK | Status: DC
  Filled 2016-10-11: qty 191

## 2016-10-11 MED ORDER — MIDAZOLAM HCL 5 MG/ML IJ SOLN
INTRAMUSCULAR | Status: AC
Start: 2016-10-11 — End: 2016-10-11
  Filled 2016-10-11: qty 3

## 2016-10-11 NOTE — ED Notes (Signed)
ED Provider at bedside. 

## 2016-10-11 NOTE — Telephone Encounter (Signed)
Pt was seen today.

## 2016-10-11 NOTE — ED Provider Notes (Addendum)
[facility] DEPT [facility] Provider Note   CSN: [id number] Arrival date & time: 10/11/16  1317     History   Chief Complaint Chief Complaint  Patient presents with  . Palpitations  Level V caveat unstable vital signs  HPI Mark [name] is a 76 y.o. male.Patient reports that he went to see [name] earlier today for a routine checkup. He was noted to be in a tachycardic rhythm. Sent here for further evaluation patient is presently asymptomatic denies any chest pain or abdominal pain however he does report generalized weakness with minimal exertion over the past 2 or 3 days. He also reports bloody bowel movements for the past 2 days  HPI  Past Medical History:  Diagnosis Date  . AAA (abdominal aortic aneurysm) (HCC)    9.1 cm repair 05/2009 ( ENDOVASCULAR REPAIR ).  MORE RECENT RE-EVALUATION OF ENLARGING AAA BY DR. Trula Slade AND BY DR. Kathlene Cote  MAY 2015 - PT STATES HE UNDERSTANDS THAT NOTHING IS LEAKING AT PRESENT TIME AND HE IS TO FOLLOW UP WITH HIS DOCTORS FOR FUTURE MONITORING I  . Arthritis    LEFT HAND, RT KNEE (08/14/2016)  . Basal cell carcinoma    "right side of my nose; think they burned it off" (08/14/2016)  . CAD (coronary artery disease)    a. s/p PCI to LAD/diagonal, subtotal of PDA 2006. b. Negative nuc 2014.  Marland Kitchen CHF (congestive heart failure) ([place]) 08/14/2016  . Chronic atrial fibrillation ([place])   . CKD (chronic kidney disease), stage III    a. stage III-IV, Cr baseline appears 1.8-2.0  . COPD (chronic obstructive pulmonary disease) ([place])   . Depression   . GERD (gastroesophageal reflux disease)   . History of kidney stones   . History of pneumothorax    LONG TIME AGO  . Hyperlipidemia   . Hypertension   . Migraine    "very rare the last 20 years" (08/14/2016)  . Myocardial infarction ([place]) 2006  . Osteoarthritis   . Renal cell carcinoma ([place])   . Shingles outbreak    PT NOTICED SKIN RASH RT GROIN AND RIGHT TRUNK ON MONDAY 10/27/13 - NOT A LOT OF  DISCOMFORT- NOT ON ANY ORAL MEDS TO TREAT.  . Sleep apnea    "dx'd; didn't try mask" (08/14/2016)  . Stroke [facility])    "CT showed I've had old strokes; never knew about it before today" (08/14/2016)    Patient Active Problem List   Diagnosis Date Noted  . Subacute delirium 10/07/2016  . Cerebrovascular accident (CVA) ([place])   . AKI (acute kidney injury) ([place]) 10/06/2016  . Alzheimer's dementia with behavioral disturbance   . Hallucinations   . LBBB (left bundle branch block)   . Acute systolic CHF (congestive heart failure) ([place]) 08/16/2016  . Acute encephalopathy 08/14/2016  . Acute on chronic respiratory failure with hypoxia ([place]) 08/14/2016  . Asthma 03/24/2016  . Preventative health care 09/22/2015  . Osteopenia 09/07/2014  . Osteoarthritis, hand 04/09/2014  . Normocytic anemia 04/09/2014  . OSA (obstructive sleep apnea) 01/08/2014  . Daytime somnolence 11/20/2013  . Renal cell carcinoma ([place]) 11/06/2013  . AAA (abdominal aortic aneurysm) without rupture ([place]) 09/22/2013  . Atrial fibrillation, chronic ([place]) 07/25/2012  . 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 BPH (benign prostatic hyperplasia) 12/21/2009  . Abdominal aortic aneurysm ([place]) 05/28/2009  . Disorder resulting from impaired renal function 05/28/2009  . Hyperlipidemia 11/11/2008  . Depression 11/11/2008  . Essential hypertension 11/11/2008  . Coronary atherosclerosis 11/11/2008  .  GERD 11/11/2008  . SKIN CANCER, HX OF 11/11/2008  . NEPHROLITHIASIS, HX OF 11/11/2008    Past Surgical History:  Procedure Laterality Date  . ABDOMINAL AORTAGRAM N/A 09/24/2013   Procedure: ABDOMINAL Maxcine Ham;  Surgeon: Serafina Mitchell, MD;  Location: [facility] CATH LAB;  Service: Cardiovascular;  Laterality: N/A;  . ABDOMINAL AORTIC ANEURYSM REPAIR  JAN 2011   ENDOVASCULAR - DR. BRABHAM  . CARDIAC CATHETERIZATION  07/04/2004   Archie Endo 07/04/2004  . COLONOSCOPY  early 1990s   /notes 11/11/2008  . CORONARY ANGIOPLASTY WITH  STENT PLACEMENT  07/05/2004   notes 07/05/2004  . KNEE ARTHROSCOPY Right   . LITHOTRIPSY    . ROBOT ASSISTED LAPAROSCOPIC NEPHRECTOMY Left 11/06/2013   Procedure: ROBOTIC ASSISTED LAPAROSCOPIC RADICAL  NEPHRECTOMY;  Surgeon: Alexis Frock, MD;  Location: [facility] ORS;  Service: Urology;  Laterality: Left;  . TONSILLECTOMY  1964       Home Medications    Prior to Admission medications   Medication Sig Start Date End Date Taking? Authorizing Provider  acetaminophen (TYLENOL) 500 MG tablet Take 1,000 mg by mouth every 6 (six) hours as needed (For pain.).    [provider]  albuterol (PROVENTIL HFA;VENTOLIN HFA) 108 (90 Base) MCG/ACT inhaler Inhale 2 puffs into the lungs every 6 (six) hours as needed for wheezing or shortness of breath. Patient not taking: Reported on 10/11/2016 01/26/16   Shelda Pal, DO  atorvastatin (LIPITOR) 40 MG tablet TAKE 1 TABLET ONE TIME DAILY Patient taking differently: Take 40 mg by mouth daily. TAKE 1 TABLET ONE TIME DAILY 04/06/16   [name], NP  carvedilol (COREG) 25 MG tablet Take 1 tablet (25 mg total) by mouth 2 (two) times daily with a meal. 08/22/16   Lavina Hamman, MD  Coenzyme Q10 200 MG capsule Take 200 mg by mouth daily.    [provider]  feeding supplement, ENSURE ENLIVE, (ENSURE ENLIVE) LIQD Take 237 mLs by mouth daily. 08/22/16   Lavina Hamman, MD  furosemide (LASIX) 20 MG tablet Take 3 tablets (60 mg total) by mouth daily. 10/07/16   Barton Dubois, MD  hydrALAZINE (APRESOLINE) 10 MG tablet Take 1 tablet (10 mg total) by mouth 3 (three) times daily. 09/20/16   Lelon Perla, MD  isosorbide dinitrate (ISORDIL) 20 MG tablet Take 1 tablet (20 mg total) by mouth 3 (three) times daily. 04/06/16   [name], NP  Multiple Vitamins-Minerals (MACUVITE EYE CARE PO) Take 1 capsule by mouth daily. 05/21/15   [provider]  Omega-3 Fatty Acids (FISH OIL) 1200 MG CAPS Take 1,200 mg by mouth daily.      [provider]  potassium chloride SA (K-DUR,KLOR-CON) 20 MEQ tablet Take 1 tablet (20 mEq total) by mouth every evening. 08/22/16   Lavina Hamman, MD  ranitidine (ZANTAC) 150 MG tablet Take 1 tablet (150 mg total) by mouth 2 (two) times daily. 04/06/16   [name], NP  sertraline (ZOLOFT) 100 MG tablet Take 1 tablet (100 mg total) by mouth every evening. 04/06/16   [name], NP  tamsulosin (FLOMAX) 0.4 MG CAPS capsule TAKE 1 CAPSULE (0.4 MG TOTAL) DAILY AFTER BREAKFAST. Patient taking differently: Take 0.4 mg by mouth daily after breakfast.  09/22/16   [name], NP  traZODone (DESYREL) 50 MG tablet Take 1 tablet (50 mg total) by mouth at bedtime. Patient taking differently: Take 50 mg by mouth at bedtime as needed for sleep.  08/22/16   Lavina Hamman, MD  warfarin (  COUMADIN) 5 MG tablet TAKE 1 TABLET EVERY DAY EXCEPT 1.5 TABLETS ON WEDNESDAY. Patient taking differently: Take 5-7.5 mg by mouth See admin instructions. Take one tablet every day except for on Wednesday. Take one and a half tablets on that day. 06/02/16   [name], NP    Family History Family History  Problem Relation Age of Onset  . Pulmonary embolism Mother        died of PE  . Heart disease Mother        before age 71  . Anuerysm Father        history of popliteal  . Coronary artery disease Unknown   . Hyperlipidemia Unknown   . Hypertension Unknown   . Prostate cancer Unknown   . Other Neg Hx        polycystic kidney disease, and colon cancer  . Colon cancer Neg Hx     Social History Social History  Substance Use Topics  . Smoking status: Former Smoker    Packs/day: 1.50    Years: 23.00    Types: Cigarettes    Quit date: 08/24/1980  . Smokeless tobacco: Never Used  . Alcohol use 1.5 oz/week    3 Standard drinks or equivalent per week     Comment: 08/14/2016 "maybe 2-3 drinks/month average"     Allergies   Ace inhibitors   Review of Systems Review of  Systems  Unable to perform ROS: Unstable vital signs  Gastrointestinal: Positive for blood in stool.  Neurological: Positive for weakness.  Hematological: Bruises/bleeds easily.     Physical Exam Updated Vital Signs BP 112/72 (BP Location: Left Arm)   Pulse (!) 140   Temp 98.1 F (36.7 C) (Oral)   Resp 20   Ht 5\' 11"  (1.803 m)   Wt 95.3 kg (210 lb)   BMI 29.29 kg/m   Physical Exam  Constitutional: No distress.  Chronically ill-appearing  HENT:  Head: Normocephalic and atraumatic.  Eyes: Conjunctivae are normal. Pupils are equal, round, and reactive to light.  Neck: Neck supple. No tracheal deviation present. No thyromegaly present.  Cardiovascular: Regular rhythm.   No murmur heard. Tachycardic irregularly irregular  Pulmonary/Chest: Effort normal and breath sounds normal.  Abdominal: Soft. Bowel sounds are normal. He exhibits no distension. There is no tenderness.  Genitourinary: Rectal exam shows guaiac positive stool.  Genitourinary Comments: Rectal normal tone nontender gross blood  Musculoskeletal: Normal range of motion. He exhibits no edema or tenderness.  Neurological: He is alert. Coordination normal.  Skin: Skin is warm and dry. No rash noted.  Purplish Ecchymosis over trunk and extremities  Psychiatric: He has a normal mood and affect.  Nursing note and vitals reviewed.    ED Treatments / Results  Labs (all labs ordered are listed, but only abnormal results are displayed) Labs Reviewed  CBC WITH DIFFERENTIAL/PLATELET - Abnormal; Notable for the following:       Result Value   RBC 2.73 (*)    Hemoglobin 8.3 (*)    HCT 26.3 (*)    RDW 17.5 (*)    Platelets 138 (*)    All other components within normal limits  OCCULT BLOOD X 1 CARD TO LAB, STOOL - Abnormal; Notable for the following:    Fecal Occult Bld POSITIVE (*)    All other components within normal limits  COMPREHENSIVE METABOLIC PANEL  TROPONIN I  PROTIME-INR    EKG  EKG  Interpretation None      ED ECG REPORT  Date: 10/11/2016  Rate: 140  Rhythm: atrial fibrillation  QRS Axis: right  Intervals: QT prolonged  ST/T Wave abnormalities: nonspecific T wave changes  Conduction Disutrbances:nonspecific intraventricular conduction delay  Narrative Interpretation:   Old EKG Reviewed: Rate increased over 10/06/2016  I have personally reviewed the EKG tracing and agree with the computerized printout as noted. Radiology No results found.  Procedures Procedures (including critical care time)  Medications Ordered in ED Medications  sodium chloride 0.9 % bolus 500 mL (500 mLs Intravenous New Bag/Given 10/11/16 1347)    Results for orders placed or performed during the hospital encounter of 10/11/16  CBC with Differential  Result Value Ref Range   WBC 7.1 4.0 - 10.5 K/uL   RBC 2.73 (L) 4.22 - 5.81 MIL/uL   Hemoglobin 8.3 (L) 13.0 - 17.0 g/dL   HCT 26.3 (L) 39.0 - 52.0 %   MCV 96.3 78.0 - 100.0 fL   MCH 30.4 26.0 - 34.0 pg   MCHC 31.6 30.0 - 36.0 g/dL   RDW 17.5 (H) 11.5 - 15.5 %   Platelets 138 (L) 150 - 400 K/uL   Neutrophils Relative % 79 %   Neutro Abs 5.6 1.7 - 7.7 K/uL   Lymphocytes Relative 14 %   Lymphs Abs 1.0 0.7 - 4.0 K/uL   Monocytes Relative 6 %   Monocytes Absolute 0.4 0.1 - 1.0 K/uL   Eosinophils Relative 1 %   Eosinophils Absolute 0.1 0.0 - 0.7 K/uL   Basophils Relative 0 %   Basophils Absolute 0.0 0.0 - 0.1 K/uL  Comprehensive metabolic panel  Result Value Ref Range   Sodium 141 135 - 145 mmol/L   Potassium 3.8 3.5 - 5.1 mmol/L   Chloride 111 101 - 111 mmol/L   CO2 20 (L) 22 - 32 mmol/L   Glucose, Bld 112 (H) 65 - 99 mg/dL   BUN 85 (H) 6 - 20 mg/dL   Creatinine, Ser 2.15 (H) 0.61 - 1.24 mg/dL   Calcium 8.4 (L) 8.9 - 10.3 mg/dL   Total Protein 5.4 (L) 6.5 - 8.1 g/dL   Albumin 2.7 (L) 3.5 - 5.0 g/dL   AST 24 15 - 41 U/L   ALT 26 17 - 63 U/L   Alkaline Phosphatase 56 38 - 126 U/L   Total Bilirubin 1.2 0.3 - 1.2 mg/dL    GFR calc non Af Amer 29 (L) >60 mL/min   GFR calc Af Amer 33 (L) >60 mL/min   Anion gap 10 5 - 15  Troponin I  Result Value Ref Range   Troponin I 0.12 (HH) <0.03 ng/mL  Occult blood card to lab, stool Provider will collect  Result Value Ref Range   Fecal Occult Bld POSITIVE (A) NEGATIVE  Protime-INR  Result Value Ref Range   Prothrombin Time 25.2 (H) 11.4 - 15.2 seconds   INR 2.24    Dg Chest 2 View  Result Date: 10/06/2016 CLINICAL DATA:  Cough and congestion for 2 weeks.  Chest pain. EXAM: CHEST  2 VIEW COMPARISON:  Sep 15, 2016 and September 01, 2016 FINDINGS: There is stable interstitial prominence, most notably in the left upper lobe and both bases, likely due to underlying fibrotic type change. There is no appreciable edema or consolidation. Heart is borderline prominent with pulmonary vascularity within normal limits. No evident adenopathy. There is degenerative change in the thoracic spine with diffuse idiopathic skeletal hyperostosis. IMPRESSION: Areas of underlying scarring/ fibrotic change without frank edema or consolidation. Stable cardiac prominence. 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 No evident adenopathy. There is no appreciable change compared to recent studies. Electronically Signed   By: Lowella Grip III M.D.   On: 10/06/2016 08:00   Dg Chest 2 View  Result Date: 09/15/2016 CLINICAL DATA:  Cough and congestion for 2 weeks.  Ex smoker. EXAM: CHEST  2 VIEW COMPARISON:  09/01/2016 FINDINGS: Cardiac silhouette is mildly enlarged. No mediastinal or hilar masses. No convincing adenopathy. Lungs are hyperexpanded. There are small pleural effusions, larger on the right. Prominent bronchovascular markings are noted bilaterally without overt pulmonary edema. No evidence of pneumonia. No pneumothorax. Skeletal structures are demineralized but grossly intact. IMPRESSION: 1. Similar appearance to most recent prior chest radiographs. 2. Small pleural  effusions without says pulmonary edema. No evidence of pneumonia. 3. Mild stable cardiomegaly. Electronically Signed   By: Lajean Manes M.D.   On: 09/15/2016 12:08   Ct Head Wo Contrast  Result Date: 10/06/2016 CLINICAL DATA:  Slurred speech and altered mental status. Short-term memory loss. EXAM: CT HEAD WITHOUT CONTRAST TECHNIQUE: Contiguous axial images were obtained from the base of the skull through the vertex without intravenous contrast. COMPARISON:  August 14, 2016 head CT and brain MRI August 14, 2016 FINDINGS: Brain: There is age related volume loss. There is no intracranial mass, hemorrhage, extra-axial fluid collection, or midline shift. There is small vessel disease throughout much of the centra semiovale bilaterally, stable. There is small vessel disease in each caudate nucleus region as well as in the anterior limbs of each internal capsule, stable. There is no new gray-white compartment lesion. No acute infarct evident. Vascular: No hyperdense vessel. There is calcification in each carotid siphon region. Skull: Bony calvarium appears intact. Sinuses/Orbits: There is a a retention cyst, incompletely visualized, in the anterior left maxillary antrum with associated mucosal thickening. There is mucosal thickening in several ethmoid air cells bilaterally. Frontal sinuses are hypoplastic. Visualized paranasal sinuses elsewhere clear. Orbits appear symmetric bilaterally. Other: Mastoid air cells are clear. IMPRESSION: Fairly extensive supratentorial small vessel disease remain stable. There is age related volume loss. No intracranial mass, hemorrhage, or extra-axial fluid collection. No acute appearing infarct. Areas of vascular calcification noted. Areas of paranasal sinus disease as summarized above. Electronically Signed   By: Lowella Grip III M.D.   On: 10/06/2016 08:18   Mr Brain Wo Contrast  Result Date: 10/06/2016 CLINICAL DATA:  76 year old male with confusion and slurred speech today.  Possible dementia. EXAM: MRI HEAD WITHOUT CONTRAST TECHNIQUE: Multiplanar, multiecho pulse sequences of the brain and surrounding structures were obtained without intravenous contrast. COMPARISON:  Head CT without contrast 0806 hours today. Brain MRI 08/14/2016. FINDINGS: Brain: No restricted diffusion to suggest acute infarction. No midline shift, mass effect, evidence of mass lesion, ventriculomegaly, extra-axial collection or acute intracranial hemorrhage. Cervicomedullary junction and pituitary are within normal limits. Confluent bilateral cerebral white matter T2 and FLAIR hyperintensity with several small areas which most resemble chronic white matter lacunar infarcts. Similar widespread T2 heterogeneity throughout the deep gray matter nuclei, the midbrain and pons, in part related to perivascular spaces. No new signal abnormality identified. Occasional chronic micro hemorrhages in the brain ir stable (right parietal lobe, left posterior temporal lobe). No new signal abnormality identified. Vascular: Major intracranial vascular flow voids are stable with generalized intracranial artery dolichoectasia. Skull and upper cervical spine: Negative. Normal bone marrow signal. Sinuses/Orbits: Normal orbits soft tissues. Stable paranasal sinuses and mastoids, with occasional mucosal thickening and small mucous retention cysts. Other:  Visible internal auditory structures appear normal. Negative scalp soft tissues. IMPRESSION: 1. Stable noncontrast MRI appearance of the brain since April with no acute intracranial abnormality. 2. Widespread signal changes again suggestive of advanced chronic small vessel disease. Consider vascular causes of dementia. Electronically Signed   By: Genevie Ann M.D.   On: 10/06/2016 15:17   Initial Impression / Assessment and Plan / ED Course  I have reviewed the triage vital signs and the nursing notes.  Pertinent labs & imaging results that were available during my care of the patient were  reviewed by me and considered in my medical decision making (see chart for details).   vagal maneuvers attempted without change in rhythm.  Case discussed with the hospital pharmacist in light of life-threatening bleeding unstable vital signs of tachycardic rhythm K centra ordered.  I consulted Dr.Karki from gastroenterology service who will see patient in hospital. I've also consulted with Dr.Merrill from hospital service who excess patient in transfer to [facility] stepdown unit. 3:20 PM patient appears comfortable put heat remains in atrial fibrillation with rapid ventricular rate. Blood pressure has improved after treatment with 500 mL intravenous normal saline bolus, Kcentra intravenous bolus, given to rapidly reverse warfarin. We'll continue to hydrate gently with normal saline at 125 mL per hour. Renal insufficiency is chronic. Elevated troponin likely secondary to demand ischemia. Patient has no chest pain Final Clinical Impressions(s) / ED Diagnoses  Diagnoses #1 acute gastrointestinal bleeding #2 atrial fibrillation with rapid ventricular response #3 symptomatic anemia #4 coagulopathy secondary to warfarin Final diagnoses:  None   #5 chronic renal insufficiency CRITICAL CARE Performed by: Orlie Dakin Total critical care time: 60 minutes Critical care time was exclusive of separately billable procedures and treating other patients. Critical care was necessary to treat or prevent imminent or life-threatening deterioration. Critical care was time spent personally by me on the following activities: development of treatment plan with patient and/or surrogate as well as nursing, discussions with consultants, evaluation of patient's response to treatment, examination of patient, obtaining history from patient or surrogate, ordering and performing treatments and interventions, ordering and review of laboratory studies, ordering and review of radiographic studies, pulse oximetry  and re-evaluation of patient's condition. New Prescriptions New Prescriptions   No medications on file     Orlie Dakin, MD 10/11/16 [place], MD 10/11/16 1537

## 2016-10-11 NOTE — Progress Notes (Signed)
    Patient coming from [facility] for treatment of acute GI bleed in an anticoagulated patient with an INR of 2.24. Baseline hemoglobin 11.1 with current hemoglobin 8.3 and very symptomatic. Patient planning up to 3 day history of jaundice weakness and dyspnea on exertion. Patient also presenting in atrial fibrillation with a heart rate of approximately 140. Patient given K centra by EDP and gentle fluid resuscitation. [facility] notified of patient and will see patient in consultation when they arrive at, hospital. Patient accepted to stepdown unit at [facility] under [facility] service.

## 2016-10-11 NOTE — H&P (Addendum)
[facility] History and Physical  Mark [name] LYY:[id number] DOB: 07/09/1940 DOA: 10/11/2016  Referring physician:   PCP: [name], NP   Chief Complaint:  Tachycardia GI bleeding  HPI:  76 year old male with history of AAA, atrial fibrillation on Coumadin, coronary artery disease, COPD, gastroesophageal reflux disease, renal cell carcinoma status post nephrectomy, hypertension, dementia, who presents with tachycardia and melena. Patient was  recently admitted and discharged on 10/07/16 for confusion, slurred speech, MRI of the brain was negative for acute CVA. Patient has been on Coumadin for atrial fibrillation. Patient was seen by PCP 6/6 for hospital follow-up due to progressive memory loss. Noted to have a heart rate in the 150s. Referred to [facility] [place] [facility], the patient stated that he has been weak for the last 2-3 days with melena for the last 2-3 days ED course BP 112/72 (BP Location: Left Arm)   Pulse (!) 140   Temp 98.1 F (36.7 C) (Oral)   Resp 20   Ht 5\' 11"  (1.803 m)   Wt 95.3 kg (210 lb)   BMI 29.29 kg/m  FOBT positive, hemoglobin 8.3, platelets 138, INR 2.3, EKG showed atrial fibrillation with a rate of 140. Patient received normal saline bolus, k centra   Review of Systems: negative for the following  Constitutional: Denies fever, chills, diaphoresis, appetite change and positive for fatigue.  HEENT: Denies photophobia, eye pain, redness, hearing loss, ear pain, congestion, sore throat, rhinorrhea, sneezing, mouth sores, trouble swallowing, neck pain, neck stiffness and tinnitus.  Respiratory: Denies SOB, DOE, cough, chest tightness, and wheezing.  Cardiovascular: Denies chest pain, palpitations and leg swelling.  Gastrointestinal: Denies nausea, vomiting, abdominal pain, diarrhea, constipation, positive for blood in stool and abdominal distention.  Genitourinary: Denies dysuria, urgency, frequency, hematuria, flank pain and difficulty urinating.   Musculoskeletal: Denies myalgias, back pain, joint swelling, arthralgias and gait problem.  Skin: Denies pallor, rash and wound.  Neurological: Denies dizziness, seizures, syncope, weakness, light-headedness, numbness and headaches.  Hematological: Denies adenopathy. Easy bruising, personal or family bleeding history  Psychiatric/Behavioral: Denies suicidal ideation, mood changes, confusion, nervousness, sleep disturbance and agitation       Past Medical History:  Diagnosis Date  . AAA (abdominal aortic aneurysm) (HCC)    9.1 cm repair 05/2009 ( ENDOVASCULAR REPAIR ).  MORE RECENT RE-EVALUATION OF ENLARGING AAA BY DR. Trula Slade AND BY DR. Kathlene Cote  MAY 2015 - PT STATES HE UNDERSTANDS THAT NOTHING IS LEAKING AT PRESENT TIME AND HE IS TO FOLLOW UP WITH HIS DOCTORS FOR FUTURE MONITORING I  . Arthritis    LEFT HAND, RT KNEE (08/14/2016)  . Basal cell carcinoma    "right side of my nose; think they burned it off" (08/14/2016)  . CAD (coronary artery disease)    a. s/p PCI to LAD/diagonal, subtotal of PDA 2006. b. Negative nuc 2014.  Marland Kitchen CHF (congestive heart failure) ([place]) 08/14/2016  . Chronic atrial fibrillation ([place])   . CKD (chronic kidney disease), stage III    a. stage III-IV, Cr baseline appears 1.8-2.0  . COPD (chronic obstructive pulmonary disease) ([place])   . Depression   . GERD (gastroesophageal reflux disease)   . History of kidney stones   . History of pneumothorax    LONG TIME AGO  . Hyperlipidemia   . Hypertension   . Migraine    "very rare the last 20 years" (08/14/2016)  . Myocardial infarction ([place]) 2006  . Osteoarthritis   . Renal cell carcinoma ([place])   . Shingles  outbreak    PT NOTICED SKIN RASH RT GROIN AND RIGHT TRUNK ON MONDAY 10/27/13 - NOT A LOT OF DISCOMFORT- NOT ON ANY ORAL MEDS TO TREAT.  . Sleep apnea    "dx'd; didn't try mask" (08/14/2016)  . Stroke [facility])    "CT showed I've had old strokes; never knew about it before today" (08/14/2016)     Past Surgical  History:  Procedure Laterality Date  . ABDOMINAL AORTAGRAM N/A 09/24/2013   Procedure: ABDOMINAL Maxcine Ham;  Surgeon: Serafina Mitchell, MD;  Location: [facility] CATH LAB;  Service: Cardiovascular;  Laterality: N/A;  . ABDOMINAL AORTIC ANEURYSM REPAIR  JAN 2011   ENDOVASCULAR - DR. BRABHAM  . CARDIAC CATHETERIZATION  07/04/2004   Archie Endo 07/04/2004  . COLONOSCOPY  early 1990s   /notes 11/11/2008  . CORONARY ANGIOPLASTY WITH STENT PLACEMENT  07/05/2004   notes 07/05/2004  . KNEE ARTHROSCOPY Right   . LITHOTRIPSY    . ROBOT ASSISTED LAPAROSCOPIC NEPHRECTOMY Left 11/06/2013   Procedure: ROBOTIC ASSISTED LAPAROSCOPIC RADICAL  NEPHRECTOMY;  Surgeon: Alexis Frock, MD;  Location: [facility] ORS;  Service: Urology;  Laterality: Left;  . TONSILLECTOMY  1964      Social History:  reports that he quit smoking about 36 years ago. His smoking use included Cigarettes. He has a 34.50 pack-year smoking history. He has never used smokeless tobacco. He reports that he drinks about 1.5 oz of alcohol per week . He reports that he does not use drugs.    Allergies  Allergen Reactions  . Ace Inhibitors Cough    Family History  Problem Relation Age of Onset  . Pulmonary embolism Mother        died of PE  . Heart disease Mother        before age 39  . Anuerysm Father        history of popliteal  . Coronary artery disease Unknown   . Hyperlipidemia Unknown   . Hypertension Unknown   . Prostate cancer Unknown   . Other Neg Hx        polycystic kidney disease, and colon cancer  . Colon cancer Neg Hx          Prior to Admission medications   Medication Sig Start Date End Date Taking? Authorizing Provider  albuterol (PROVENTIL HFA;VENTOLIN HFA) 108 (90 Base) MCG/ACT inhaler Inhale 2 puffs into the lungs every 6 (six) hours as needed for wheezing or shortness of breath. Patient not taking: Reported on 10/11/2016 01/26/16   Shelda Pal, DO  atorvastatin (LIPITOR) 40 MG tablet TAKE 1 TABLET ONE TIME  DAILY Patient taking differently: Take 40 mg by mouth daily. TAKE 1 TABLET ONE TIME DAILY 04/06/16   [name], NP  carvedilol (COREG) 25 MG tablet Take 1 tablet (25 mg total) by mouth 2 (two) times daily with a meal. 08/22/16   Lavina Hamman, MD  Coenzyme Q10 200 MG capsule Take 200 mg by mouth daily.    [provider]  feeding supplement, ENSURE ENLIVE, (ENSURE ENLIVE) LIQD Take 237 mLs by mouth daily. 08/22/16   Lavina Hamman, MD  furosemide (LASIX) 20 MG tablet Take 3 tablets (60 mg total) by mouth daily. 10/07/16   Barton Dubois, MD  hydrALAZINE (APRESOLINE) 10 MG tablet Take 1 tablet (10 mg total) by mouth 3 (three) times daily. 09/20/16   Lelon Perla, MD  isosorbide dinitrate (ISORDIL) 20 MG tablet Take 1 tablet (20 mg total) by mouth 3 (three) times daily. 04/06/16  [name], NP  Multiple Vitamins-Minerals (MACUVITE EYE CARE PO) Take 1 capsule by mouth daily. 05/21/15   [provider]  Omega-3 Fatty Acids (FISH OIL) 1200 MG CAPS Take 1,200 mg by mouth daily.     [provider]  potassium chloride SA (K-DUR,KLOR-CON) 20 MEQ tablet Take 1 tablet (20 mEq total) by mouth every evening. 08/22/16   Lavina Hamman, MD  ranitidine (ZANTAC) 150 MG tablet Take 1 tablet (150 mg total) by mouth 2 (two) times daily. 04/06/16   [name], NP  sertraline (ZOLOFT) 100 MG tablet Take 1 tablet (100 mg total) by mouth every evening. 04/06/16   [name], NP  tamsulosin (FLOMAX) 0.4 MG CAPS capsule TAKE 1 CAPSULE (0.4 MG TOTAL) DAILY AFTER BREAKFAST. Patient taking differently: Take 0.4 mg by mouth daily after breakfast.  09/22/16   [name], NP  traZODone (DESYREL) 50 MG tablet Take 1 tablet (50 mg total) by mouth at bedtime. Patient taking differently: Take 50 mg by mouth at bedtime as needed for sleep.  08/22/16   Lavina Hamman, MD  warfarin (COUMADIN) 5 MG tablet TAKE 1 TABLET EVERY DAY EXCEPT 1.5 TABLETS ON  WEDNESDAY. Patient taking differently: Take 5-7.5 mg by mouth See admin instructions. Take one tablet every day except for on Wednesday. Take one and a half tablets on that day. 06/02/16   [name], NP     Physical Exam: Vitals:   10/11/16 1530 10/11/16 1533 10/11/16 1534 10/11/16 1544  BP: 94/79 101/83  (!) 115/91  Pulse: (!) 149  (!) 25 (!) 124  Resp: 17 (!) 24 20 (!) 21  Temp:      TempSrc:      SpO2: 97%  96% 96%  Weight:      Height:            Vitals:   10/11/16 1530 10/11/16 1533 10/11/16 1534 10/11/16 1544  BP: 94/79 101/83  (!) 115/91  Pulse: (!) 149  (!) 25 (!) 124  Resp: 17 (!) 24 20 (!) 21  Temp:      TempSrc:      SpO2: 97%  96% 96%  Weight:      Height:       Constitutional: NAD, calm, comfortable Eyes: PERRL, lids and conjunctivae normal ENMT: Mucous membranes are moist. Posterior pharynx clear of any exudate or lesions.Normal dentition.  Neck: normal, supple, no masses, no thyromegaly Respiratory: clear to auscultation bilaterally, no wheezing, no crackles. Normal respiratory effort. No accessory muscle use.  Cardiovascular: Tachycardic irregularly irregular , no murmurs / rubs / gallops. No extremity edema. 2+ pedal pulses. No carotid bruits.  Abdomen: no tenderness, no masses palpated. No hepatosplenomegaly. Bowel sounds positive.  Musculoskeletal: no clubbing / cyanosis. No joint deformity upper and lower extremities. Good ROM, no contractures. Normal muscle tone.  Skin: no rashes, lesions, ulcers. No induration Neurologic: CN 2-12 grossly intact. Sensation intact, DTR normal. Strength 5/5 in all 4.  Psychiatric: Normal judgment and insight. Alert and oriented x 2. Normal mood.     Labs on Admission: I have personally reviewed following labs and imaging studies  CBC:  Recent Labs Lab 10/06/16 0745 10/07/16 0819 10/11/16 1327  WBC 6.0 5.0 7.1  NEUTROABS  --   --  5.6  HGB 11.6* 11.1* 8.3*  HCT 36.4* 35.3* 26.3*  MCV 93.8 93.9 96.3   PLT 121* 107* 138*    Basic Metabolic Panel:  Recent Labs Lab 10/06/16 0745 10/06/16 1859 10/07/16 0819 10/11/16  1327  NA 140  --  141 141  K 3.5  --  4.2 3.8  CL 105  --  109 111  CO2 23  --  25 20*  GLUCOSE 108*  --  130* 112*  BUN 60*  --  56* 85*  CREATININE 3.08*  --  2.55* 2.15*  CALCIUM 8.7*  --  8.6* 8.4*  MG  --  2.3  --   --     GFR: Estimated Creatinine Clearance: 36.1 mL/min (A) (by C-G formula based on SCr of 2.15 mg/dL (H)).  Liver Function Tests:  Recent Labs Lab 10/06/16 0745 10/11/16 1327  AST 37 24  ALT 74* 26  ALKPHOS 67 56  BILITOT 1.9* 1.2  PROT 6.1* 5.4*  ALBUMIN 3.3* 2.7*   No results for input(s): LIPASE, AMYLASE in the last 168 hours. No results for input(s): AMMONIA in the last 168 hours.  Coagulation Profile:  Recent Labs Lab 10/06/16 0745 10/07/16 0819 10/11/16 1327  INR 1.94 2.41 2.24   No results for input(s): DDIMER in the last 72 hours.  Cardiac Enzymes:  Recent Labs Lab 10/11/16 1327  TROPONINI 0.12*    BNP (last 3 results) No results for input(s): PROBNP in the last 8760 hours.  HbA1C: No results for input(s): HGBA1C in the last 72 hours. Lab Results  Component Value Date   HGBA1C 5.8 (H) 10/07/2016     CBG:  Recent Labs Lab 10/06/16 0802 10/07/16 1627  GLUCAP 109* 106*    Lipid Profile: No results for input(s): CHOL, HDL, LDLCALC, TRIG, CHOLHDL, LDLDIRECT in the last 72 hours.  Thyroid Function Tests: No results for input(s): TSH, T4TOTAL, FREET4, T3FREE, THYROIDAB in the last 72 hours.  Anemia Panel: No results for input(s): VITAMINB12, FOLATE, FERRITIN, TIBC, IRON, RETICCTPCT in the last 72 hours.  Urine analysis:    Component Value Date/Time   COLORURINE YELLOW 10/06/2016 0743   APPEARANCEUR CLEAR 10/06/2016 0743   LABSPEC 1.013 10/06/2016 0743   PHURINE 5.0 10/06/2016 0743   GLUCOSEU NEGATIVE 10/06/2016 0743   GLUCOSEU NEGATIVE 07/17/2014 0945   HGBUR NEGATIVE 10/06/2016 0743    BILIRUBINUR NEGATIVE 10/06/2016 0743   KETONESUR NEGATIVE 10/06/2016 0743   PROTEINUR 30 (A) 10/06/2016 0743   UROBILINOGEN 0.2 07/17/2014 0945   NITRITE NEGATIVE 10/06/2016 0743   LEUKOCYTESUR TRACE (A) 10/06/2016 0743    Sepsis Labs: @LABRCNTIP (procalcitonin:4,lacticidven:4) )No results found for this or any previous visit (from the past 240 hour(s)).       Radiological Exams on Admission: No results found. Dg Chest 2 View  Result Date: 10/06/2016 CLINICAL DATA:  Cough and congestion for 2 weeks.  Chest pain. EXAM: CHEST  2 VIEW COMPARISON:  Sep 15, 2016 and September 01, 2016 FINDINGS: There is stable interstitial prominence, most notably in the left upper lobe and both bases, likely due to underlying fibrotic type change. There is no appreciable edema or consolidation. Heart is borderline prominent with pulmonary vascularity within normal limits. No evident adenopathy. There is degenerative change in the thoracic spine with diffuse idiopathic skeletal hyperostosis. IMPRESSION: Areas of underlying scarring/ fibrotic change without frank edema or consolidation. Stable cardiac prominence. 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 No evident adenopathy. There is no appreciable change compared to recent studies. Electronically Signed   By: Lowella Grip III M.D.   On: 10/06/2016 08:00   Dg Chest 2 View  Result Date: 09/15/2016 CLINICAL DATA:  Cough and congestion for 2 weeks.  Ex smoker. EXAM:  CHEST  2 VIEW COMPARISON:  09/01/2016 FINDINGS: Cardiac silhouette is mildly enlarged. No mediastinal or hilar masses. No convincing adenopathy. Lungs are hyperexpanded. There are small pleural effusions, larger on the right. Prominent bronchovascular markings are noted bilaterally without overt pulmonary edema. No evidence of pneumonia. No pneumothorax. Skeletal structures are demineralized but grossly intact. IMPRESSION: 1. Similar appearance to most recent prior chest  radiographs. 2. Small pleural effusions without says pulmonary edema. No evidence of pneumonia. 3. Mild stable cardiomegaly. Electronically Signed   By: Lajean Manes M.D.   On: 09/15/2016 12:08   Ct Head Wo Contrast  Result Date: 10/06/2016 CLINICAL DATA:  Slurred speech and altered mental status. Short-term memory loss. EXAM: CT HEAD WITHOUT CONTRAST TECHNIQUE: Contiguous axial images were obtained from the base of the skull through the vertex without intravenous contrast. COMPARISON:  August 14, 2016 head CT and brain MRI August 14, 2016 FINDINGS: Brain: There is age related volume loss. There is no intracranial mass, hemorrhage, extra-axial fluid collection, or midline shift. There is small vessel disease throughout much of the centra semiovale bilaterally, stable. There is small vessel disease in each caudate nucleus region as well as in the anterior limbs of each internal capsule, stable. There is no new gray-white compartment lesion. No acute infarct evident. Vascular: No hyperdense vessel. There is calcification in each carotid siphon region. Skull: Bony calvarium appears intact. Sinuses/Orbits: There is a a retention cyst, incompletely visualized, in the anterior left maxillary antrum with associated mucosal thickening. There is mucosal thickening in several ethmoid air cells bilaterally. Frontal sinuses are hypoplastic. Visualized paranasal sinuses elsewhere clear. Orbits appear symmetric bilaterally. Other: Mastoid air cells are clear. IMPRESSION: Fairly extensive supratentorial small vessel disease remain stable. There is age related volume loss. No intracranial mass, hemorrhage, or extra-axial fluid collection. No acute appearing infarct. Areas of vascular calcification noted. Areas of paranasal sinus disease as summarized above. Electronically Signed   By: Lowella Grip III M.D.   On: 10/06/2016 08:18   Mr Brain Wo Contrast  Result Date: 10/06/2016 CLINICAL DATA:  76 year old male with confusion  and slurred speech today. Possible dementia. EXAM: MRI HEAD WITHOUT CONTRAST TECHNIQUE: Multiplanar, multiecho pulse sequences of the brain and surrounding structures were obtained without intravenous contrast. COMPARISON:  Head CT without contrast 0806 hours today. Brain MRI 08/14/2016. FINDINGS: Brain: No restricted diffusion to suggest acute infarction. No midline shift, mass effect, evidence of mass lesion, ventriculomegaly, extra-axial collection or acute intracranial hemorrhage. Cervicomedullary junction and pituitary are within normal limits. Confluent bilateral cerebral white matter T2 and FLAIR hyperintensity with several small areas which most resemble chronic white matter lacunar infarcts. Similar widespread T2 heterogeneity throughout the deep gray matter nuclei, the midbrain and pons, in part related to perivascular spaces. No new signal abnormality identified. Occasional chronic micro hemorrhages in the brain ir stable (right parietal lobe, left posterior temporal lobe). No new signal abnormality identified. Vascular: Major intracranial vascular flow voids are stable with generalized intracranial artery dolichoectasia. Skull and upper cervical spine: Negative. Normal bone marrow signal. Sinuses/Orbits: Normal orbits soft tissues. Stable paranasal sinuses and mastoids, with occasional mucosal thickening and small mucous retention cysts. Other: Visible internal auditory structures appear normal. Negative scalp soft tissues. IMPRESSION: 1. Stable noncontrast MRI appearance of the brain since April with no acute intracranial abnormality. 2. Widespread signal changes again suggestive of advanced chronic small vessel disease. Consider vascular causes of dementia. Electronically Signed   By: Genevie Ann M.D.   On: 10/06/2016 15:17  EKG: Independently reviewed. Irregularly irregular, heart rate of 140  Assessment/Plan Principal Problem:   GI bleed-source likely lower GI, in the setting of coagulopathy  secondary to Coumadin Admitted to step down Serial CBC, INR, Consulted [place] [facility] Recommend serial CBC Transfuse 2 units for hemoglobin less than 8.0 Baseline hemoglobin around 11 Dr. Pincus Sanes stated that he will be available for any acute needs overnight, otherwise patient will be seen by Dr. Silverio Decamp in am  Empiric PPI  Chronic kidney disease stage III, baseline 1.8 Creatinine currently at baseline  Atrial fibrillation with rapid ventricular response Will start Cardizem drip, on Coreg at home Hold anticoagulation pending GI evaluation    Hyperlipidemia-continue statin  Chronic systolic heart failure without exacerbation, moderate MR, left bundle branch block Most recent echo shows EF of 20-25% on 08/15/16   Essential hypertension, blood pressures stable -continue Coreg, isosorbide  BPH, stable -continue flomax  GERD, stable -continue PPI  Depression, stable -continue zoloft  Hyperlipidemia, stable -continue lipitor  AAA -f/u outpt. Pt due for his yearly appt and imaging.      DVT prophylaxis: *Coumadin on hold,SCD's      Code Status Orders Full code        consults called:Gastroenterology-Dr. Dannis  Family Communication: Admission, patients condition and plan of care including tests being ordered have been discussed with the patient  who indicates understanding and agree with the plan and Code Status  Admission status: inpatient    Disposition plan: Further plan will depend as patient's clinical course evolves and further radiologic and laboratory data become available. Likely home when stable   At the time of admission, it appears that the appropriate admission status for this patient is INPATIENT .Thisis judged to be reasonable and necessary in order to provide the required intensity of service to ensure the patient's safetygiven thepresenting symptoms, physical exam findings, and initial radiographic and laboratory data in the  context of their chronic comorbidities.   Reyne Dumas MD [facility] Pager [phone]  If 7PM-7AM, please contact night-coverage [email or web address] Password [email or web address]  10/11/2016, 6:03 PM

## 2016-10-11 NOTE — ED Notes (Signed)
Attempted to call report x2 to [facility] at [name] -- nurse unavailable both times; number given for callback.

## 2016-10-11 NOTE — Progress Notes (Signed)
Subjective:    Patient ID: Mark [name], male    DOB: [birth year], 76 y.o.   MRN: [id number]  HPI   Mark [name] is a 76 yr old male with complex medical hx including Hx of CVA, Renal cell cardcinoma, MI (2006), CKD, CAF who presents today with his son Thurmond Butts for discussion about SNF placement.  Son is very concerned that patient is not doing well at home due to progressive memory loss.  Last week his neighbor called me to tell me that the patient's home was "3 feet deep of trash and smelled terrible."   Today he does note some worsening shortness of breath.  He denies palpitations. He continues his oxygen at home.     Review of Systems    see HPI  Past Medical History:  Diagnosis Date  . AAA (abdominal aortic aneurysm) (HCC)    9.1 cm repair 05/2009 ( ENDOVASCULAR REPAIR ).  MORE RECENT RE-EVALUATION OF ENLARGING AAA BY DR. Trula Slade AND BY DR. Kathlene Cote  MAY 2015 - PT STATES HE UNDERSTANDS THAT NOTHING IS LEAKING AT PRESENT TIME AND HE IS TO FOLLOW UP WITH HIS DOCTORS FOR FUTURE MONITORING I  . Arthritis    LEFT HAND, RT KNEE (08/14/2016)  . Basal cell carcinoma    "right side of my nose; think they burned it off" (08/14/2016)  . CAD (coronary artery disease)    a. s/p PCI to LAD/diagonal, subtotal of PDA 2006. b. Negative nuc 2014.  Marland Kitchen CHF (congestive heart failure) ([place]) 08/14/2016  . Chronic atrial fibrillation ([place])   . CKD (chronic kidney disease), stage III    a. stage III-IV, Cr baseline appears 1.8-2.0  . COPD (chronic obstructive pulmonary disease) ([place])   . Depression   . GERD (gastroesophageal reflux disease)   . History of kidney stones   . History of pneumothorax    LONG TIME AGO  . Hyperlipidemia   . Hypertension   . Migraine    "very rare the last 20 years" (08/14/2016)  . Myocardial infarction ([place]) 2006  . Osteoarthritis   . Renal cell carcinoma ([place])   . Shingles outbreak    PT NOTICED SKIN RASH RT GROIN AND RIGHT TRUNK ON MONDAY 10/27/13 - NOT A LOT OF  DISCOMFORT- NOT ON ANY ORAL MEDS TO TREAT.  . Sleep apnea    "dx'd; didn't try mask" (08/14/2016)  . Stroke [facility])    "CT showed I've had old strokes; never knew about it before today" (08/14/2016)     Social History   Social History  . Marital status: Divorced    Spouse name: N/A  . Number of children: 2  . Years of education: N/A   Occupational History  .  Retired   Social History Main Topics  . Smoking status: Former Smoker    Packs/day: 1.50    Years: 23.00    Types: Cigarettes    Quit date: 08/24/1980  . Smokeless tobacco: Never Used  . Alcohol use 1.5 oz/week    3 Standard drinks or equivalent per week     Comment: 08/14/2016 "maybe 2-3 drinks/month average"  . Drug use: No  . Sexual activity: Not on file   Other Topics Concern  . Not on file   Social History Narrative   Retired   Divorced   2 sons 50 &30     Occupation:  Part time sports broadcaster    Smoking Status:  quit (08/24/1980)   Packs/Day:  1.0    Caffeine  use/day:  3 beverages daily    Does Patient Exercise:  no   Alcohol Use - yes    Past Surgical History:  Procedure Laterality Date  . ABDOMINAL AORTAGRAM N/A 09/24/2013   Procedure: ABDOMINAL Maxcine Ham;  Surgeon: Serafina Mitchell, MD;  Location: [facility] CATH LAB;  Service: Cardiovascular;  Laterality: N/A;  . ABDOMINAL AORTIC ANEURYSM REPAIR  JAN 2011   ENDOVASCULAR - DR. BRABHAM  . CARDIAC CATHETERIZATION  07/04/2004   Archie Endo 07/04/2004  . COLONOSCOPY  early 1990s   /notes 11/11/2008  . CORONARY ANGIOPLASTY WITH STENT PLACEMENT  07/05/2004   notes 07/05/2004  . KNEE ARTHROSCOPY Right   . LITHOTRIPSY    . ROBOT ASSISTED LAPAROSCOPIC NEPHRECTOMY Left 11/06/2013   Procedure: ROBOTIC ASSISTED LAPAROSCOPIC RADICAL  NEPHRECTOMY;  Surgeon: Alexis Frock, MD;  Location: [facility] ORS;  Service: Urology;  Laterality: Left;  . TONSILLECTOMY  1964    Family History  Problem Relation Age of Onset  . Pulmonary embolism Mother        died of PE  . Heart disease Mother          before age 48  . Anuerysm Father        history of popliteal  . Coronary artery disease Unknown   . Hyperlipidemia Unknown   . Hypertension Unknown   . Prostate cancer Unknown   . Other Neg Hx        polycystic kidney disease, and colon cancer  . Colon cancer Neg Hx     Allergies  Allergen Reactions  . Ace Inhibitors Cough    Current Outpatient Prescriptions on File Prior to Visit  Medication Sig Dispense Refill  . acetaminophen (TYLENOL) 500 MG tablet Take 1,000 mg by mouth every 6 (six) hours as needed (For pain.).    Marland Kitchen atorvastatin (LIPITOR) 40 MG tablet TAKE 1 TABLET ONE TIME DAILY (Patient taking differently: Take 40 mg by mouth daily. TAKE 1 TABLET ONE TIME DAILY) 90 tablet 1  . carvedilol (COREG) 25 MG tablet Take 1 tablet (25 mg total) by mouth 2 (two) times daily with a meal. 60 tablet 0  . Coenzyme Q10 200 MG capsule Take 200 mg by mouth daily.    . furosemide (LASIX) 20 MG tablet Take 3 tablets (60 mg total) by mouth daily.    . hydrALAZINE (APRESOLINE) 10 MG tablet Take 1 tablet (10 mg total) by mouth 3 (three) times daily. 270 tablet 3  . isosorbide dinitrate (ISORDIL) 20 MG tablet Take 1 tablet (20 mg total) by mouth 3 (three) times daily. 270 tablet 1  . Multiple Vitamins-Minerals (MACUVITE EYE CARE PO) Take 1 capsule by mouth daily.    . Omega-3 Fatty Acids (FISH OIL) 1200 MG CAPS Take 1,200 mg by mouth daily.     . potassium chloride SA (K-DUR,KLOR-CON) 20 MEQ tablet Take 1 tablet (20 mEq total) by mouth every evening. 30 tablet 0  . ranitidine (ZANTAC) 150 MG tablet Take 1 tablet (150 mg total) by mouth 2 (two) times daily. 180 tablet 1  . sertraline (ZOLOFT) 100 MG tablet Take 1 tablet (100 mg total) by mouth every evening. 90 tablet 1  . tamsulosin (FLOMAX) 0.4 MG CAPS capsule TAKE 1 CAPSULE (0.4 MG TOTAL) DAILY AFTER BREAKFAST. (Patient taking differently: Take 0.4 mg by mouth daily after breakfast. ) 90 capsule 1  . traZODone (DESYREL) 50 MG tablet Take 1  tablet (50 mg total) by mouth at bedtime. (Patient taking differently: Take 50 mg by mouth at  bedtime as needed for sleep. ) 30 tablet 0  . warfarin (COUMADIN) 5 MG tablet TAKE 1 TABLET EVERY DAY EXCEPT 1.5 TABLETS ON WEDNESDAY. (Patient taking differently: Take 5-7.5 mg by mouth See admin instructions. Take one tablet every day except for on Wednesday. Take one and a half tablets on that day.) 90 tablet 1  . albuterol (PROVENTIL HFA;VENTOLIN HFA) 108 (90 Base) MCG/ACT inhaler Inhale 2 puffs into the lungs every 6 (six) hours as needed for wheezing or shortness of breath. (Patient not taking: Reported on 10/11/2016) 1 Inhaler 0  . feeding supplement, ENSURE ENLIVE, (ENSURE ENLIVE) LIQD Take 237 mLs by mouth daily. 237 mL 12   No current facility-administered medications on file prior to visit.     BP (!) 82/56 (BP Location: Right Arm, Cuff Size: Normal)   Pulse (!) 48   Resp 20   Ht 5\' 11"  (1.803 m)   SpO2 100% Comment: 3L continuous oxygent.   Objective:   Physical Exam  Constitutional:  Pale, seated in wheelchair. Appears mildly dyspneic   Cardiovascular: An irregular rhythm present. Tachycardia present.   Pulmonary/Chest: Breath sounds normal.  Mild increased WOB  Neurological: He is alert.  Psychiatric:  Judgement and insight seem impaired.   Confused           Assessment & Plan:  Tachycardia- Initial HR upon arrival was documented by CMA as 48 but I believe that was inaccurate. EKG is performed and personally reviewed showing heart rate 155 and what appears to be SVT. He is hypotensive.  I gave report to Hoover Brunette ER physician at the [place] ED and patient will be wheeled down to the ED on the first floor via wheelchair.    Dementia- failing at home.  Memory is very poor. I do not feel that patient is safe to return home alone. I did briefly discuss SNF placement with the patient today and he seems agreeable.  Son wants patient placed. Will need to be addressed upon  discharge assuming patient is admitted.

## 2016-10-11 NOTE — Progress Notes (Signed)
CRITICAL VALUE ALERT  Critical Value:  Troponin 0.16  Date & Time Notied:  9:59 PM   Provider Notified: Baltazar Najjar, NP  Orders Received/Actions taken:

## 2016-10-11 NOTE — Patient Instructions (Signed)
Please go directly to the ED on the first floor.  

## 2016-10-11 NOTE — ED Triage Notes (Signed)
Pt sent from PMD office with DX SVT HR 150

## 2016-10-11 NOTE — Consult Note (Addendum)
[facility] Consult Note   History Mark [name] MRN # [id number]  Date of Admission: 10/11/2016 Date of Consultation: 10/11/2016 Referring physician: Dr. Reyne Dumas, MD  Reason for Consultation/Chief Complaint: Melena and anemia  Subjective  HPI:  This is a 76 year old man who was just discharged from this hospital several days ago for altered mental status who now presented to [facility] after being seen and his family practice today and found to be tachycardic. He has a multiplicity of medical problems outlined below. Most notably, he has a history of an endograft repair of an aortic aneurysm, congestive heart failure, coronary artery disease with a distant stent, chronic kidney disease, and atrial fibrillation on Coumadin. He also has COPD and a prior left nephrectomy for malignancy.  Upon presentation to the [facility], he was found to be in rapid A. fib and also had been complaining of some "blood in my stool". This seems to been going on 2 or 3 days. He is a very poor historian due to his dementia. While he is awake and conversational, he can recall little or nothing accurately about his history and is uncertain of the date. He knows he is a hospital in [place]. In the ED he was placed on a diltiazem drip and his hemoglobin was found to be 8.3, down from discharge hemoglobin of 11.3 just several days ago. He was given a dose oral Protonix, vitamin K 10 mg and K Centra reverse his elevated INR.  At this time he is laying comfortably in bed in the stepdown unit. He is still in rapid A. fib on a Cardizem drip. His blood pressure is 125/95 with a heart rate of 115. He is interactive and alert following all commands and conversational. He is about to receive the first of 2 planned units of PRBC transfusion because his hemoglobin dropped to 7.2. Repeat INR was down to 1.45 after the medicines noted above.  ROS:  He denies chest pain or dyspnea or abdominal  pain. There has been no report of hematemesis, and he has had no melena since arrival at this hospital several hours ago.  All other systems are negative except as noted above in the HPI  Past Medical History Past Medical History:  Diagnosis Date  . AAA (abdominal aortic aneurysm) (HCC)    9.1 cm repair 05/2009 ( ENDOVASCULAR REPAIR ).  MORE RECENT RE-EVALUATION OF ENLARGING AAA BY DR. Trula Slade AND BY DR. Kathlene Cote  MAY 2015 - PT STATES HE UNDERSTANDS THAT NOTHING IS LEAKING AT PRESENT TIME AND HE IS TO FOLLOW UP WITH HIS DOCTORS FOR FUTURE MONITORING I  . Arthritis    LEFT HAND, RT KNEE (08/14/2016)  . Basal cell carcinoma    "right side of my nose; think they burned it off" (08/14/2016)  . CAD (coronary artery disease)    a. s/p PCI to LAD/diagonal, subtotal of PDA 2006. b. Negative nuc 2014.  Marland Kitchen CHF (congestive heart failure) ([place]) 08/14/2016  . Chronic atrial fibrillation ([place])   . CKD (chronic kidney disease), stage III    a. stage III-IV, Cr baseline appears 1.8-2.0  . COPD (chronic obstructive pulmonary disease) ([place])   . Depression   . GERD (gastroesophageal reflux disease)   . History of kidney stones   . History of pneumothorax    LONG TIME AGO  . Hyperlipidemia   . Hypertension   . Migraine    "very rare the last 20 years" (08/14/2016)  . Myocardial infarction ([place]) 2006  .  Osteoarthritis   . Renal cell carcinoma ([place])   . Shingles outbreak    PT NOTICED SKIN RASH RT GROIN AND RIGHT TRUNK ON MONDAY 10/27/13 - NOT A LOT OF DISCOMFORT- NOT ON ANY ORAL MEDS TO TREAT.  . Sleep apnea    "dx'd; didn't try mask" (08/14/2016)  . Stroke [facility])    "CT showed I've had old strokes; never knew about it before today" (08/14/2016)  Discharge summary from his recent hospitalization was reviewed.   Past Surgical History Past Surgical History:  Procedure Laterality Date  . ABDOMINAL AORTAGRAM N/A 09/24/2013   Procedure: ABDOMINAL Maxcine Ham;  Surgeon: Serafina Mitchell, MD;  Location[facility] CATH  LAB;  Service: Cardiovascular;  Laterality: N/A;  . ABDOMINAL AORTIC ANEURYSM REPAIR  JAN 2011   ENDOVASCULAR - DR. BRABHAM  . CARDIAC CATHETERIZATION  07/04/2004   Archie Endo 07/04/2004  . COLONOSCOPY  early 1990s   /notes 11/11/2008  . CORONARY ANGIOPLASTY WITH STENT PLACEMENT  07/05/2004   notes 07/05/2004  . KNEE ARTHROSCOPY Right   . LITHOTRIPSY    . ROBOT ASSISTED LAPAROSCOPIC NEPHRECTOMY Left 11/06/2013   Procedure: ROBOTIC ASSISTED LAPAROSCOPIC RADICAL  NEPHRECTOMY;  Surgeon: Alexis Frock, MD;  Location: [facility] ORS;  Service: Urology;  Laterality: Left;  . TONSILLECTOMY  1964    Family History Family History  Problem Relation Age of Onset  . Pulmonary embolism Mother        died of PE  . Heart disease Mother        before age 17  . Anuerysm Father        history of popliteal  . Coronary artery disease Unknown   . Hyperlipidemia Unknown   . Hypertension Unknown   . Prostate cancer Unknown   . Other Neg Hx        polycystic kidney disease, and colon cancer  . Colon cancer Neg Hx     Social History Social History   Social History  . Marital status: Divorced    Spouse name: N/A  . Number of children: 2  . Years of education: N/A   Occupational History  .  Retired   Social History Main Topics  . Smoking status: Former Smoker    Packs/day: 1.50    Years: 23.00    Types: Cigarettes    Quit date: 08/24/1980  . Smokeless tobacco: Never Used  . Alcohol use 1.5 oz/week    3 Standard drinks or equivalent per week     Comment: 08/14/2016 "maybe 2-3 drinks/month average"  . Drug use: No  . Sexual activity: Not Asked   Other Topics Concern  . None   Social History Narrative   Retired   Divorced   2 sons 47 &30     Occupation:  Part time sports broadcaster    Smoking Status:  quit (08/24/1980)   Packs/Day:  1.0    Caffeine use/day:  3 beverages daily    Does Patient Exercise:  no   Alcohol Use - yes    Allergies Allergies  Allergen Reactions  . Ace Inhibitors  Cough    Outpatient Meds Home medications from the H+P and/or nursing med reconciliation reviewed.  Inpatient med list reviewed  _____________________________________________________________________ Objective   Exam:  Current vital signs  Patient Vitals for the past 8 hrs:  BP Temp Temp src Pulse Resp SpO2  10/11/16 2200 (!) 118/105 - - (!) 122 (!) 23 94 %  10/11/16 2153 (!) 124/93 97.7 F (36.5 C) Oral 83 (!) 24 94 %  10/11/16 2100 115/82 - - 99 19 98 %  10/11/16 2054 99/75 97.5 F (36.4 C) Oral (!) 128 (!) 21 94 %  10/11/16 2000 (!) 120/99 97.5 F (36.4 C) Oral (!) 141 (!) 31 98 %  10/11/16 1544 (!) 115/91 - - (!) 124 (!) 21 96 %  10/11/16 1534 - - - (!) 25 20 96 %  10/11/16 1533 101/83 - - - (!) 24 -  10/11/16 1530 94/79 - - (!) 149 17 97 %  10/11/16 1504 (!) 114/101 - - (!) 160 19 95 %  10/11/16 1500 (!) 125/109 - - (!) 158 (!) 27 96 %  10/11/16 1430 (!) 125/92 - - 70 (!) 24 98 %    Intake/Output Summary (Last 24 hours) at 10/11/16 2222 Last data filed at 10/11/16 2055  Gross per 24 hour  Intake              553 ml  Output              430 ml  Net              123 ml    Physical Exam:    General: this is a pale, chronically ill-appearing elderly  patient in no acute distress  Eyes: sclera anicteric, no redness. He has conjunctival pallor  ENT: oral mucosa moist without lesions, no cervical or supraclavicular lymphadenopathy, false dentition  CV: Irregularly irregular and fast,, no JVD,, no peripheral edema. Feet are cool with strong DP pulse on the left, diminished on the right  Resp: clear to auscultation bilaterally, normal RR and effort noted  GI: soft, no tenderness, with active bowel sounds. No guarding or palpable organomegaly noted  Skin; warm and dry, no rash or jaundice noted. Pale  Neuro: awake, alert and able to follow commands. Normal gross motor function and fluent speech. He is aware of the month, and it takes him a while to come up with  the year. He states that he has a son coming to visit him from [place].  Rectal with his nurse Jacqlyn Larsen present: Normal external, normal sphincter tone, dark maroon blood on the glove  Labs:   Recent Labs Lab 10/07/16 0819 10/11/16 1327 10/11/16 1930  WBC 5.0 7.1 5.7  HGB 11.1* 8.3* 7.2*  HCT 35.3* 26.3* 23.0*  PLT 107* 138* 121*    Recent Labs Lab 10/11/16 1327  NA 141  K 3.8  CL 111  CO2 20*  BUN 85*  ALBUMIN 2.7*  ALKPHOS 56  ALT 26  AST 24  GLUCOSE 112*    Recent Labs Lab 10/11/16 1930  INR 1.45   INR was 2.24 at 13:30  Radiologic studies: Last chest x-ray on 10/06/2016 shows: And cardiac silhouette and some chronic bilateral parenchymal lung scarring  Ct angiogram from 5/15 showed enlarging AAA around graft.  Renal mass was also discovered incidentally on that exam.  @ASSESSMENTPLANBEGIN @ Impression:  Melena Acute blood loss anemia Coronary artery disease COPD A. fib with RVR on chronic anticoagulation Chronic renal failure  I suspect he has an upper GI bleed, most likely a duodenal ulcer or AVM exacerbated by the anticoagulation. However, aortoenteric fistula is also a concern, even though this happens less often with endovascular graft repair than it does with the older type of graft repair. This could be his sentinel bleed.  Plan:  He is only just received a dose of oral acid suppression and he is about to have PRBCs transfused. He needs some more time  for the vitamin K and the blood products to improve his INR even further. I  am changing him to a Protonix drip. A third IV is about to be placed. Nothing by mouth for planned upper endoscopy tonight.  I discussed an upper endoscopy with him including the risks and benefits. However, I spoke with his son Thurmond Butts to obtain informed consent because of this patient's cognitive impairment. He is at increased risk for complications of an endoscopic procedure due to his multiple severe medical  comorbidities.  Thank you for the courtesy of this consult.  Please contact me with any questions or concerns.  [name] Pager: [phone] Mon-Fri 8a-5p [phone] after 5p, weekends, holidays

## 2016-10-12 ENCOUNTER — Inpatient Hospital Stay (HOSPITAL_COMMUNITY): Payer: Medicare HMO

## 2016-10-12 ENCOUNTER — Encounter (HOSPITAL_COMMUNITY): Payer: Self-pay | Admitting: General Practice

## 2016-10-12 ENCOUNTER — Encounter (HOSPITAL_COMMUNITY)
Admission: EM | Disposition: A | Discharge status: Skilled Nursing Facility | Payer: Self-pay | Source: Home / Self Care | Attending: Internal Medicine

## 2016-10-12 DIAGNOSIS — R0603 Acute respiratory distress: Secondary | ICD-10-CM

## 2016-10-12 HISTORY — PX: ESOPHAGOGASTRODUODENOSCOPY: SHX5428

## 2016-10-12 LAB — CBC
HCT: 25.8 % — ABNORMAL LOW (ref 39.0–52.0)
Hemoglobin: 8.2 g/dL — ABNORMAL LOW (ref 13.0–17.0)
MCH: 29.2 pg (ref 26.0–34.0)
MCHC: 31.8 g/dL (ref 30.0–36.0)
MCV: 91.8 fL (ref 78.0–100.0)
Platelets: 86 10*3/uL — ABNORMAL LOW (ref 150–400)
RBC: 2.81 MIL/uL — ABNORMAL LOW (ref 4.22–5.81)
RDW: 17.9 % — AB (ref 11.5–15.5)
WBC: 6.7 10*3/uL (ref 4.0–10.5)

## 2016-10-12 LAB — COMPREHENSIVE METABOLIC PANEL
ALT: 23 U/L (ref 17–63)
AST: 23 U/L (ref 15–41)
Albumin: 2.5 g/dL — ABNORMAL LOW (ref 3.5–5.0)
Alkaline Phosphatase: 53 U/L (ref 38–126)
Anion gap: 11 (ref 5–15)
BILIRUBIN TOTAL: 2 mg/dL — AB (ref 0.3–1.2)
BUN: 74 mg/dL — AB (ref 6–20)
CHLORIDE: 111 mmol/L (ref 101–111)
CO2: 20 mmol/L — ABNORMAL LOW (ref 22–32)
CREATININE: 2.06 mg/dL — AB (ref 0.61–1.24)
Calcium: 8.1 mg/dL — ABNORMAL LOW (ref 8.9–10.3)
GFR, EST AFRICAN AMERICAN: 35 mL/min — AB (ref >60)
GFR, EST NON AFRICAN AMERICAN: 30 mL/min — AB (ref >60)
Glucose, Bld: 135 mg/dL — ABNORMAL HIGH (ref 65–99)
POTASSIUM: 3.2 mmol/L — AB (ref 3.5–5.1)
Sodium: 142 mmol/L (ref 135–145)
TOTAL PROTEIN: 4.9 g/dL — AB (ref 6.5–8.1)

## 2016-10-12 LAB — PROTIME-INR
INR: 1.33
PROTHROMBIN TIME: 16.6 s — AB (ref 11.4–15.2)

## 2016-10-12 LAB — TROPONIN I: TROPONIN I: 0.7 ng/mL — AB (ref <0.03)

## 2016-10-12 SURGERY — EGD (ESOPHAGOGASTRODUODENOSCOPY)
Anesthesia: Moderate Sedation

## 2016-10-12 MED ORDER — FUROSEMIDE 10 MG/ML IJ SOLN
40.0000 mg | Freq: Once | INTRAMUSCULAR | Status: AC
  Administered 2016-10-12: 40 mg via INTRAVENOUS
  Filled 2016-10-12: qty 4

## 2016-10-12 MED ORDER — FENTANYL CITRATE (PF) 100 MCG/2ML IJ SOLN
INTRAMUSCULAR | Status: DC | PRN
  Administered 2016-10-12: 25 ug via INTRAVENOUS

## 2016-10-12 MED ORDER — MIDAZOLAM HCL 10 MG/2ML IJ SOLN
INTRAMUSCULAR | Status: DC | PRN
  Administered 2016-10-12: 2 mg via INTRAVENOUS
  Administered 2016-10-12: 1 mg via INTRAVENOUS

## 2016-10-12 MED ORDER — ACETAMINOPHEN 325 MG PO TABS: 650.0000 mg | ORAL_TABLET | Freq: Four times a day (QID) | ORAL | Status: DC | PRN

## 2016-10-12 NOTE — Progress Notes (Signed)
Daily Rounding Note  10/12/2016, 12:17 PM  LOS: 1 day   SUBJECTIVE:   One bloody stool this AM.  Pt not nauseated.  No abd pain    Feels ok  OBJECTIVE:         Vital signs in last 24 hours:    Temp:  [96.4 F (35.8 C)-98.1 F (36.7 C)] 96.4 F (35.8 C) (06/07 0700) Pulse Rate:  [25-237] 46 (06/07 0900) Resp:  [17-35] 25 (06/07 0600) BP: (82-140)/(56-109) 135/85 (06/07 0900) SpO2:  [85 %-100 %] 100 % (06/07 0900) FiO2 (%):  [31 %-50 %] 50 % (06/07 0351) Weight:  [95.3 kg (210 lb)] 95.3 kg (210 lb) (06/06 1324) Last BM Date: 10/12/16 Filed Weights   10/11/16 1324  Weight: 95.3 kg (210 lb)   General: comfortable, not ill looking   Heart: irreg Chest: clear bil.  Abdomen: soft, NT, ND.  Active BS  Extremities: no CCE Neuro/Psych:  Oriented x 3.  Appropriate.  Fully alert.   Intake/Output from previous day: 06/06 0701 - 06/07 0700 In: 2530.1 [I.V.:1293.1; Blood:687; IV Piggyback:550] Out: 7939 [Urine:1230; Stool:350]  Intake/Output this shift: Total I/O In: 735 [P.O.:480; I.V.:255] Out: 900 [Urine:900]  Lab Results:  Recent Labs  10/11/16 1327 10/11/16 1930 10/12/16 1133  WBC 7.1 5.7 6.7  HGB 8.3* 7.2* 8.2*  HCT 26.3* 23.0* 25.8*  PLT 138* 121* PENDING   BMET  Recent Labs  10/11/16 1327  NA 141  K 3.8  CL 111  CO2 20*  GLUCOSE 112*  BUN 85*  CREATININE 2.15*  CALCIUM 8.4*   LFT  Recent Labs  10/11/16 1327  PROT 5.4*  ALBUMIN 2.7*  AST 24  ALT 26  ALKPHOS 56  BILITOT 1.2   PT/INR  Recent Labs  10/11/16 1930 10/12/16 1133  LABPROT 17.8* 16.6*  INR 1.45 1.33   Hepatitis Panel No results for input(s): HEPBSAG, HCVAB, HEPAIGM, HEPBIGM in the last 72 hours.  Studies/Results: Dg Chest Port 1 View  Result Date: 10/12/2016 CLINICAL DATA:  Acute respiratory distress. EXAM: PORTABLE CHEST 1 VIEW COMPARISON:  Frontal and lateral views 10/06/2016 FINDINGS: The heart is enlarged.  Unchanged mediastinal contours. Development of multifocal opacities throughout the right lung. Probable retrocardiac opacity. There is vascular congestion and mild peribronchial cuffing. Difficult to exclude pleural effusions. No pneumothorax. IMPRESSION: Development of multifocal opacities throughout the right lung and retrocardiac left lung base, may be pulmonary edema, pneumonia or aspiration. Difficult to exclude pleural effusions. Cardiomegaly with vascular congestion. Electronically Signed   By: Jeb Levering M.D.   On: 10/12/2016 04:20   Scheduled Meds: . atorvastatin  40 mg Oral Daily  . carvedilol  6.25 mg Oral BID WC  . isosorbide dinitrate  20 mg Oral TID  . sertraline  100 mg Oral QPM  . sodium chloride flush  3 mL Intravenous Q12H  . tamsulosin  0.4 mg Oral QPC breakfast   Continuous Infusions: . sodium chloride 10 mL/hr at 10/12/16 1054  . diltiazem (CARDIZEM) infusion 5 mg/hr (10/12/16 1029)  . pantoprozole (PROTONIX) infusion 8 mg/hr (10/12/16 1027)   PRN Meds:.acetaminophen, levalbuterol, ondansetron **OR** ondansetron (ZOFRAN) IV   ASSESMENT:   *  GIB with melena, anemia.   EGD at midnight: multiple non-bleeding duodenal ulcers with pigmented material.  HH.  Schatzki ring.  protonix drip in place.  No ASA, NSAIDs PTA  *  Chronic Coumadin for A fib.  On hold. INR 2.2, reversed with K centra to  1.3  *  Blood loss anemia with rapid afib.  PRBC x 2.  Hgb 11.1 pre bleed on 6/2, nadir 7.2 yesterday evening, 8.2 now.     PLAN   *  Await serum H pylori Ab test.      Azucena Freed  10/12/2016, 12:17 PM Pager: [phone]   Attending physician's note   I have taken an interval history, reviewed the chart and examined the patient. I agree with the Advanced Practitioner's note, impression and recommendations.  76 year old male transferred from [facility] with symptomatic anemia, rapid A. fib and hematochezia. EGD overnight with evidence of multiple duodenal  ulcers with pigmented material, no active bleeding. Hemoglobin has remained stable status post transfusion Continue Protonix drip Can transition to PPI twice a day tomorrow if hemoglobin continues to remain stable Advance diet to full liquids Await H. pylori status, will treat if positive  K Denzil Magnuson, MD [phone] Mon-Fri 8a-5p [phone] after 5p, weekends, holidays

## 2016-10-12 NOTE — Care Management Note (Addendum)
Case Management Note  Patient Details  Name: Mark [name] MRN: [id number] Date of Birth: Apr 05, 1941  Subjective/Objective:  From home alone,presents with GIB,  active with [facility] for [facility], PT, and Social Work, per [facility] rep , they tried to reach patient by phone and have not been able to reach patient yet.  Patient states he take bus transportation to [facility].  He has a son Mark [name] who lives in [place] , he is here to see him but he will have to go back to [place] with his family.  Patient states he has a cane at home that he uses.  He also has home oxygen  With [facility].   Await pt/ot eval.    PCP   [name]                Action/Plan: NCM will cont to follow for dc needs.   Expected Discharge Date:                  Expected Discharge Plan:  [place]  In-House Referral:     Discharge planning Services  CM Consult  Post Acute Care Choice:  Home Health, Resumption of Svcs/PTA Provider Choice offered to:  Patient  DME Arranged:    DME Agency:     HH Arranged:  RN, PT, Social Work [employer] Agency:  [place]  Status of Service:  In process, will continue to follow  If discussed at Long Length of Stay Meetings, dates discussed:    Additional Comments:  Zenon Mayo, RN 10/12/2016, 12:14 PM

## 2016-10-12 NOTE — H&P (View-Only) (Signed)
[facility] Consult Note   History Mark [name] MRN # [id number]  Date of Admission: 10/11/2016 Date of Consultation: 10/11/2016 Referring physician: Dr. Reyne Dumas, MD  Reason for Consultation/Chief Complaint: Melena and anemia  Subjective  HPI:  This is a 76 year old man who was just discharged from this hospital several days ago for altered mental status who now presented to [facility] after being seen and his family practice today and found to be tachycardic. He has a multiplicity of medical problems outlined below. Most notably, he has a history of an endograft repair of an aortic aneurysm, congestive heart failure, coronary artery disease with a distant stent, chronic kidney disease, and atrial fibrillation on Coumadin. He also has COPD and a prior left nephrectomy for malignancy.  Upon presentation to the [facility], he was found to be in rapid A. fib and also had been complaining of some "blood in my stool". This seems to been going on 2 or 3 days. He is a very poor historian due to his dementia. While he is awake and conversational, he can recall little or nothing accurately about his history and is uncertain of the date. He knows he is a hospital in [place]. In the ED he was placed on a diltiazem drip and his hemoglobin was found to be 8.3, down from discharge hemoglobin of 11.3 just several days ago. He was given a dose oral Protonix, vitamin K 10 mg and K Centra reverse his elevated INR.  At this time he is laying comfortably in bed in the stepdown unit. He is still in rapid A. fib on a Cardizem drip. His blood pressure is 125/95 with a heart rate of 115. He is interactive and alert following all commands and conversational. He is about to receive the first of 2 planned units of PRBC transfusion because his hemoglobin dropped to 7.2. Repeat INR was down to 1.45 after the medicines noted above.  ROS:  He denies chest pain or dyspnea or abdominal  pain. There has been no report of hematemesis, and he has had no melena since arrival at this hospital several hours ago.  All other systems are negative except as noted above in the HPI  Past Medical History Past Medical History:  Diagnosis Date  . AAA (abdominal aortic aneurysm) (HCC)    9.1 cm repair 05/2009 ( ENDOVASCULAR REPAIR ).  MORE RECENT RE-EVALUATION OF ENLARGING AAA BY DR. Trula Slade AND BY DR. Kathlene Cote  MAY 2015 - PT STATES HE UNDERSTANDS THAT NOTHING IS LEAKING AT PRESENT TIME AND HE IS TO FOLLOW UP WITH HIS DOCTORS FOR FUTURE MONITORING I  . Arthritis    LEFT HAND, RT KNEE (08/14/2016)  . Basal cell carcinoma    "right side of my nose; think they burned it off" (08/14/2016)  . CAD (coronary artery disease)    a. s/p PCI to LAD/diagonal, subtotal of PDA 2006. b. Negative nuc 2014.  Marland Kitchen CHF (congestive heart failure) ([place]) 08/14/2016  . Chronic atrial fibrillation ([place])   . CKD (chronic kidney disease), stage III    a. stage III-IV, Cr baseline appears 1.8-2.0  . COPD (chronic obstructive pulmonary disease) ([place])   . Depression   . GERD (gastroesophageal reflux disease)   . History of kidney stones   . History of pneumothorax    LONG TIME AGO  . Hyperlipidemia   . Hypertension   . Migraine    "very rare the last 20 years" (08/14/2016)  . Myocardial infarction ([place]) 2006  .  Osteoarthritis   . Renal cell carcinoma ([place])   . Shingles outbreak    PT NOTICED SKIN RASH RT GROIN AND RIGHT TRUNK ON MONDAY 10/27/13 - NOT A LOT OF DISCOMFORT- NOT ON ANY ORAL MEDS TO TREAT.  . Sleep apnea    "dx'd; didn't try mask" (08/14/2016)  . Stroke [facility])    "CT showed I've had old strokes; never knew about it before today" (08/14/2016)  Discharge summary from his recent hospitalization was reviewed.   Past Surgical History Past Surgical History:  Procedure Laterality Date  . ABDOMINAL AORTAGRAM N/A 09/24/2013   Procedure: ABDOMINAL Maxcine Ham;  Surgeon: Serafina Mitchell, MD;  Location[facility] CATH  LAB;  Service: Cardiovascular;  Laterality: N/A;  . ABDOMINAL AORTIC ANEURYSM REPAIR  JAN 2011   ENDOVASCULAR - DR. BRABHAM  . CARDIAC CATHETERIZATION  07/04/2004   Archie Endo 07/04/2004  . COLONOSCOPY  early 1990s   /notes 11/11/2008  . CORONARY ANGIOPLASTY WITH STENT PLACEMENT  07/05/2004   notes 07/05/2004  . KNEE ARTHROSCOPY Right   . LITHOTRIPSY    . ROBOT ASSISTED LAPAROSCOPIC NEPHRECTOMY Left 11/06/2013   Procedure: ROBOTIC ASSISTED LAPAROSCOPIC RADICAL  NEPHRECTOMY;  Surgeon: Alexis Frock, MD;  Location: [facility] ORS;  Service: Urology;  Laterality: Left;  . TONSILLECTOMY  1964    Family History Family History  Problem Relation Age of Onset  . Pulmonary embolism Mother        died of PE  . Heart disease Mother        before age 78  . Anuerysm Father        history of popliteal  . Coronary artery disease Unknown   . Hyperlipidemia Unknown   . Hypertension Unknown   . Prostate cancer Unknown   . Other Neg Hx        polycystic kidney disease, and colon cancer  . Colon cancer Neg Hx     Social History Social History   Social History  . Marital status: Divorced    Spouse name: N/A  . Number of children: 2  . Years of education: N/A   Occupational History  .  Retired   Social History Main Topics  . Smoking status: Former Smoker    Packs/day: 1.50    Years: 23.00    Types: Cigarettes    Quit date: 08/24/1980  . Smokeless tobacco: Never Used  . Alcohol use 1.5 oz/week    3 Standard drinks or equivalent per week     Comment: 08/14/2016 "maybe 2-3 drinks/month average"  . Drug use: No  . Sexual activity: Not Asked   Other Topics Concern  . None   Social History Narrative   Retired   Divorced   2 sons 17 &30     Occupation:  Part time sports broadcaster    Smoking Status:  quit (08/24/1980)   Packs/Day:  1.0    Caffeine use/day:  3 beverages daily    Does Patient Exercise:  no   Alcohol Use - yes    Allergies Allergies  Allergen Reactions  . Ace Inhibitors  Cough    Outpatient Meds Home medications from the H+P and/or nursing med reconciliation reviewed.  Inpatient med list reviewed  _____________________________________________________________________ Objective   Exam:  Current vital signs  Patient Vitals for the past 8 hrs:  BP Temp Temp src Pulse Resp SpO2  10/11/16 2200 (!) 118/105 - - (!) 122 (!) 23 94 %  10/11/16 2153 (!) 124/93 97.7 F (36.5 C) Oral 83 (!) 24 94 %  10/11/16 2100 115/82 - - 99 19 98 %  10/11/16 2054 99/75 97.5 F (36.4 C) Oral (!) 128 (!) 21 94 %  10/11/16 2000 (!) 120/99 97.5 F (36.4 C) Oral (!) 141 (!) 31 98 %  10/11/16 1544 (!) 115/91 - - (!) 124 (!) 21 96 %  10/11/16 1534 - - - (!) 25 20 96 %  10/11/16 1533 101/83 - - - (!) 24 -  10/11/16 1530 94/79 - - (!) 149 17 97 %  10/11/16 1504 (!) 114/101 - - (!) 160 19 95 %  10/11/16 1500 (!) 125/109 - - (!) 158 (!) 27 96 %  10/11/16 1430 (!) 125/92 - - 70 (!) 24 98 %    Intake/Output Summary (Last 24 hours) at 10/11/16 2222 Last data filed at 10/11/16 2055  Gross per 24 hour  Intake              553 ml  Output              430 ml  Net              123 ml    Physical Exam:    General: this is a pale, chronically ill-appearing elderly  patient in no acute distress  Eyes: sclera anicteric, no redness. He has conjunctival pallor  ENT: oral mucosa moist without lesions, no cervical or supraclavicular lymphadenopathy, false dentition  CV: Irregularly irregular and fast,, no JVD,, no peripheral edema. Feet are cool with strong DP pulse on the left, diminished on the right  Resp: clear to auscultation bilaterally, normal RR and effort noted  GI: soft, no tenderness, with active bowel sounds. No guarding or palpable organomegaly noted  Skin; warm and dry, no rash or jaundice noted. Pale  Neuro: awake, alert and able to follow commands. Normal gross motor function and fluent speech. He is aware of the month, and it takes him a while to come up with  the year. He states that he has a son coming to visit him from [place].  Rectal with his nurse Jacqlyn Larsen present: Normal external, normal sphincter tone, dark maroon blood on the glove  Labs:   Recent Labs Lab 10/07/16 0819 10/11/16 1327 10/11/16 1930  WBC 5.0 7.1 5.7  HGB 11.1* 8.3* 7.2*  HCT 35.3* 26.3* 23.0*  PLT 107* 138* 121*    Recent Labs Lab 10/11/16 1327  NA 141  K 3.8  CL 111  CO2 20*  BUN 85*  ALBUMIN 2.7*  ALKPHOS 56  ALT 26  AST 24  GLUCOSE 112*    Recent Labs Lab 10/11/16 1930  INR 1.45   INR was 2.24 at 13:30  Radiologic studies: Last chest x-ray on 10/06/2016 shows: And cardiac silhouette and some chronic bilateral parenchymal lung scarring  Ct angiogram from 5/15 showed enlarging AAA around graft.  Renal mass was also discovered incidentally on that exam.  @ASSESSMENTPLANBEGIN @ Impression:  Melena Acute blood loss anemia Coronary artery disease COPD A. fib with RVR on chronic anticoagulation Chronic renal failure  I suspect he has an upper GI bleed, most likely a duodenal ulcer or AVM exacerbated by the anticoagulation. However, aortoenteric fistula is also a concern, even though this happens less often with endovascular graft repair than it does with the older type of graft repair. This could be his sentinel bleed.  Plan:  He is only just received a dose of oral acid suppression and he is about to have PRBCs transfused. He needs some more time  for the vitamin K and the blood products to improve his INR even further. I  am changing him to a Protonix drip. A third IV is about to be placed. Nothing by mouth for planned upper endoscopy tonight.  I discussed an upper endoscopy with him including the risks and benefits. However, I spoke with his son Thurmond Butts to obtain informed consent because of this patient's cognitive impairment. He is at increased risk for complications of an endoscopic procedure due to his multiple severe medical  comorbidities.  Thank you for the courtesy of this consult.  Please contact me with any questions or concerns.  [name] Pager: [phone] Mon-Fri 8a-5p [phone] after 5p, weekends, holidays

## 2016-10-12 NOTE — Progress Notes (Signed)
[facility] TEAM 1 - Stepdown/ICU TEAM  KELLY EISLER  VEH:[id number] DOB: [birth year] DOA: 10/11/2016 PCP: [name], NP    Brief Narrative:  76 year old male with hx of AAA, atrial fibrillation on Coumadin, CAD, COPD, GERD, renal cell carcinoma status post nephrectomy, HTN, and dementia who presented with tachycardia and melena. Patient was admitted and discharged on 10/07/16 for confusion w/ slurred speech, at which time an MRI of the brain was negative for acute CVA. Patient was seen by his PCP 6/6 for hospital follow-up, and noted to have a heart rate in the 150s. He was referred to [facility] where he also reported 2-3 days of melena.  In the ED his HR was 140, he was guaiac +, INR 2.3, and his Hgb was 8.3.    Subjective: Pt is alert and conversant but mildly confused.  He denies cp, sob, n/v, or abdom pain.    Assessment & Plan:  GIB - multiple duodenal ulcers  Now s/p EGD revealing above - cont PPI - clear liquids only for now - advance diet at discretion of GI   Acute blood loss anemia Hgb dropped from 11.1 to 8.3 in 4 days - transfused 2U PRBC thus far - keep Hgb > 7.9 w/ hx of CAD  Recent Labs Lab 10/06/16 0745 10/07/16 0819 10/11/16 1327 10/11/16 1930  HGB 11.6* 11.1* 8.3* 7.2*   9.5cm AAA S/p endograft repair 2011 - pt hx is very poor due to dementia, but he did tell family at one point that he noted copious amounts of BRBPR - check [place] of aorta to eval for possible entero-aortic fistula   CAD s/p PCI to LAD and 1st diag 2006  Keep hemoglobin at 8.0 or greater  Chronic kidney disease stage III crt baseline 1.8 - currently near baseline - follow trend   Recent Labs Lab 10/06/16 0745 10/07/16 0819 10/11/16 1327  CREATININE 3.08* 2.55* 2.15*    Permanent Atrial fibrillation with rapid ventricular response anticoag on hold for now - diltiazem IV until intake more consistent - HR now well controlled   ?Dementia Advanced chronic small vessel disease  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 - family reports progressive cognitive decline and feel he is no longer safe to live on his own - PT/OT to see  Hyperlipidemia continue statin  Chronic systolic heart failure without exacerbation EF of 20-25% on 08/15/16 - no clinical evidence of volume overload presently - developed transient hypoxia/pulm edema last PM likely due to transfusion which quickly resolved w/ lasix Filed Weights   10/11/16 1324  Weight: 95.3 kg (210 lb)    Essential hypertension Controlled at present  BPH continue flomax  GERD  Depression continue zoloft  Hyperlipidemia continue lipitor   DVT prophylaxis: SCDs Code Status: FULL CODE Family Communication: spoke w/ son and ex-wife at bedside  Disposition Plan: SDU - PT/OT - follow HGB - eval AAA  Consultants:  [facility]  Procedures: EGD 6/7 - small HH - moderate Schatzki ring - normal stomach - multiple non-bleeding duodenal ulcers   Antimicrobials:  none  Objective: Blood pressure 135/85, pulse (!) 46, temperature (!) 96.4 F (35.8 C), temperature source Axillary, resp. rate (!) 25, height 5\' 11"  (1.803 m), weight 95.3 kg (210 lb), SpO2 100 %.  Intake/Output Summary (Last 24 hours) at 10/12/16 1000 Last data filed at 10/12/16 0900  Gross per 24 hour  Intake          3265.12 ml  Output  2080 ml  Net          1185.12 ml   Filed Weights   10/11/16 1324  Weight: 95.3 kg (210 lb)    Examination: General: No acute respiratory distress Lungs: Clear to auscultation bilaterally without wheezes or crackles Cardiovascular: Irregularly irregular with rate controlled without murmur gallop or rub Abdomen: Nontender, nondistended, soft, bowel sounds positive, no rebound, no ascites, no appreciable mass Extremities: No significant cyanosis, clubbing, or edema bilateral lower extremities  CBC:  Recent Labs Lab 10/06/16 0745 10/07/16 0819 10/11/16 1327 10/11/16 1930  WBC  6.0 5.0 7.1 5.7  NEUTROABS  --   --  5.6  --   HGB 11.6* 11.1* 8.3* 7.2*  HCT 36.4* 35.3* 26.3* 23.0*  MCV 93.8 93.9 96.3 94.3  PLT 121* 107* 138* 938*   Basic Metabolic Panel:  Recent Labs Lab 10/06/16 0745 10/06/16 1859 10/07/16 0819 10/11/16 1327 10/11/16 1930  NA 140  --  141 141  --   K 3.5  --  4.2 3.8  --   CL 105  --  109 111  --   CO2 23  --  25 20*  --   GLUCOSE 108*  --  130* 112*  --   BUN 60*  --  56* 85*  --   CREATININE 3.08*  --  2.55* 2.15*  --   CALCIUM 8.7*  --  8.6* 8.4*  --   MG  --  2.3  --   --  2.2   GFR: Estimated Creatinine Clearance: 36.1 mL/min (A) (by C-G formula based on SCr of 2.15 mg/dL (H)).  Liver Function Tests:  Recent Labs Lab 10/06/16 0745 10/11/16 1327  AST 37 24  ALT 74* 26  ALKPHOS 67 56  BILITOT 1.9* 1.2  PROT 6.1* 5.4*  ALBUMIN 3.3* 2.7*   Coagulation Profile:  Recent Labs Lab 10/06/16 0745 10/07/16 0819 10/11/16 1327 10/11/16 1930  INR 1.94 2.41 2.24 1.45    Cardiac Enzymes:  Recent Labs Lab 10/11/16 1327 10/11/16 1930  TROPONINI 0.12* 0.16*    HbA1C: Hgb A1c MFr Bld  Date/Time Value Ref Range Status  10/07/2016 08:19 AM 5.8 (H) 4.8 - 5.6 % Final    Comment:    (NOTE)         Pre-diabetes: 5.7 - 6.4         Diabetes: >6.4         Glycemic control for adults with diabetes: <7.0     CBG:  Recent Labs Lab 10/06/16 0802 10/07/16 1627  GLUCAP 109* 106*    Recent Results (from the past 240 hour(s))  MRSA PCR Screening     Status: None   Collection Time: 10/11/16  5:55 PM  Result Value Ref Range Status   MRSA by PCR NEGATIVE NEGATIVE Final    Comment:        The GeneXpert MRSA Assay (FDA approved for NASAL specimens only), is one component of a comprehensive MRSA colonization surveillance program. It is not intended to diagnose MRSA infection nor to guide or monitor treatment for MRSA infections.      Scheduled Meds: . atorvastatin  40 mg Oral Daily  . carvedilol  6.25 mg Oral  BID WC  . isosorbide dinitrate  20 mg Oral TID  . sertraline  100 mg Oral QPM  . sodium chloride flush  3 mL Intravenous Q12H  . tamsulosin  0.4 mg Oral QPC breakfast   Continuous Infusions: . sodium chloride 50 mL/hr at  10/12/16 0419  . diltiazem (CARDIZEM) infusion 10 mg/hr (10/12/16 0853)  . pantoprozole (PROTONIX) infusion 8 mg/hr (10/12/16 0046)     LOS: 1 day   Cherene Altes, MD [facility] Office  [phone] Pager - Text Page per Amion as per below:  On-Call/Text Page:      [email or web address]      password [email or web address]  If 7PM-7AM, please contact night-coverage [email or web address] Password [email or web address] 10/12/2016, 10:00 AM

## 2016-10-12 NOTE — Progress Notes (Addendum)
During rounds RN reports pts sats dropped to mid 80's on [place], placed on venti mask with sats 88-91%, lung bases diminished with crackles. CXR ordered and completed. RN paged Baltazar Najjar NP, Lasix 40 mg IVP ordered and completed.

## 2016-10-12 NOTE — Interval H&P Note (Signed)
History and Physical Interval Note:  10/12/2016 12:09 AM  Mark [name]  has presented today for surgery, with the diagnosis of melena and anemia  The various methods of treatment have been discussed with the patient and family. After consideration of risks, benefits and other options for treatment, the patient has consented to  Procedure(s) with comments: ESOPHAGOGASTRODUODENOSCOPY (EGD) (N/A) - bedside procedure in [facility] as a surgical intervention .  The patient's history has been reviewed, patient examined, no change in status, stable for surgery.  I have reviewed the patient's chart and labs.  Questions were answered to the patient's satisfaction.     [name]

## 2016-10-12 NOTE — Op Note (Signed)
[facility] Patient Name: Mark [name] Procedure Date : 10/12/2016 MRN: [id number] Attending MD: Estill Cotta. Loletha Carrow , MD Date of Birth: [birth year] CSN: [id number] Age: 76 Admit Type: Inpatient Procedure:                Upper GI endoscopy Indications:              Acute post hemorrhagic anemia, Melena Providers:                Mallie Mussel L. Loletha Carrow, MD, Elna Breslow, RN, Corliss Parish, Technician Referring MD:              Medicines:                Midazolam 3 mg IV, Fentanyl 25 micrograms IV Complications:            No immediate complications. Estimated Blood Loss:     Estimated blood loss: none. Procedure:                Pre-Anesthesia Assessment:                           - Prior to the procedure, a History and Physical                            was performed, and patient medications and                            allergies were reviewed. The patient's tolerance of                            previous anesthesia was also reviewed. The risks                            and benefits of the procedure and the sedation                            options and risks were discussed with the patient.                            All questions were answered, and informed consent                            was obtained. Prior Anticoagulants: The patient has                            taken Coumadin (warfarin), last dose was day of                            procedure. ASA Grade Assessment: III - A patient                            with severe systemic disease. After reviewing the  risks and benefits, the patient was deemed in                            satisfactory condition to undergo the procedure.                           After obtaining informed consent, the endoscope was                            passed under direct vision. Throughout the                            procedure, the patient's blood pressure, pulse, and                oxygen saturations were monitored continuously. The                            [id number]-[id number] ([id number]) scope was introduced through the                            mouth, and advanced to the second part of duodenum.                            The upper GI endoscopy was accomplished without                            difficulty. The patient tolerated the procedure                            well. Scope In: Scope Out: Findings:      A small hiatal hernia was present.      A moderate Schatzki ring (acquired) was found in the distal esophagus.      The stomach was normal.      The cardia and gastric fundus were normal on retroflexion.      Multiple non-bleeding superficial duodenal ulcers with pigmented       material were found in the duodenal bulb and in the first portion of the       duodenum. The largest lesion was 6 mm in largest dimension. Impression:               - Small hiatal hernia.                           - Moderate Schatzki ring.                           - Normal stomach.                           - Multiple non-bleeding duodenal ulcers with                            pigmented material.                           - No specimens collected. Moderate  Sedation:      Moderate (conscious) sedation was administered by the endoscopy nurse       and supervised by the endoscopist. The following parameters were       monitored: oxygen saturation, heart rate, blood pressure, and response       to care. Total physician intraservice time was 4 minutes. Recommendation:           - Clear liquid diet.                           - Continue serial hemoglobin and acid suppression                           Serum H. pylori antibody                           - Continue present medications. Procedure Code(s):        --- Professional ---                           [phone], Esophagogastroduodenoscopy, flexible,                            transoral; diagnostic, including collection of                             specimen(s) by brushing or washing, when performed                            (separate procedure) Diagnosis Code(s):        --- Professional ---                           K44.9, Diaphragmatic hernia without obstruction or                            gangrene                           K22.2, Esophageal obstruction                           K26.9, Duodenal ulcer, unspecified as acute or                            chronic, without hemorrhage or perforation                           D62, Acute posthemorrhagic anemia                           K92.1, Melena (includes Hematochezia) CPT copyright 2016 American Medical Association. All rights reserved. The codes documented in this report are preliminary and upon coder review may  be revised to meet current compliance requirements. [name], MD 10/12/2016 12:32:47 AM This report has been signed electronically. Number of Addenda: 0

## 2016-10-13 ENCOUNTER — Telehealth: Payer: Self-pay | Admitting: *Deleted

## 2016-10-13 DIAGNOSIS — N183 Chronic kidney disease, stage 3 (moderate): Secondary | ICD-10-CM

## 2016-10-13 DIAGNOSIS — F321 Major depressive disorder, single episode, moderate: Secondary | ICD-10-CM

## 2016-10-13 DIAGNOSIS — E784 Other hyperlipidemia: Secondary | ICD-10-CM

## 2016-10-13 DIAGNOSIS — F0150 Vascular dementia without behavioral disturbance: Secondary | ICD-10-CM

## 2016-10-13 DIAGNOSIS — I5022 Chronic systolic (congestive) heart failure: Secondary | ICD-10-CM

## 2016-10-13 DIAGNOSIS — D62 Acute posthemorrhagic anemia: Secondary | ICD-10-CM

## 2016-10-13 LAB — H. PYLORI ANTIBODY, IGG: H Pylori IgG: <0.8 Index Value (ref 0.00–0.79)

## 2016-10-13 LAB — BASIC METABOLIC PANEL
Anion gap: 6 (ref 5–15)
BUN: 55 mg/dL — ABNORMAL HIGH (ref 6–20)
CALCIUM: 8.3 mg/dL — AB (ref 8.9–10.3)
CHLORIDE: 113 mmol/L — AB (ref 101–111)
CO2: 23 mmol/L (ref 22–32)
CREATININE: 1.87 mg/dL — AB (ref 0.61–1.24)
GFR calc Af Amer: 39 mL/min — ABNORMAL LOW (ref >60)
GFR calc non Af Amer: 34 mL/min — ABNORMAL LOW (ref >60)
Glucose, Bld: 105 mg/dL — ABNORMAL HIGH (ref 65–99)
Potassium: 3.2 mmol/L — ABNORMAL LOW (ref 3.5–5.1)
SODIUM: 142 mmol/L (ref 135–145)

## 2016-10-13 LAB — PROTIME-INR
INR: 1.33
PROTHROMBIN TIME: 16.6 s — AB (ref 11.4–15.2)

## 2016-10-13 LAB — PREPARE RBC (CROSSMATCH)

## 2016-10-13 LAB — CBC
HCT: 23.9 % — ABNORMAL LOW (ref 39.0–52.0)
HCT: 25.2 % — ABNORMAL LOW (ref 39.0–52.0)
HEMOGLOBIN: 7.5 g/dL — AB (ref 13.0–17.0)
HEMOGLOBIN: 7.6 g/dL — AB (ref 13.0–17.0)
MCH: 29.2 pg (ref 26.0–34.0)
MCH: 28.9 pg (ref 26.0–34.0)
MCHC: 31.4 g/dL (ref 30.0–36.0)
MCHC: 30.2 g/dL (ref 30.0–36.0)
MCV: 93 fL (ref 78.0–100.0)
MCV: 95.8 fL (ref 78.0–100.0)
PLATELETS: 80 10*3/uL — AB (ref 150–400)
Platelets: 84 10*3/uL — ABNORMAL LOW (ref 150–400)
RBC: 2.57 MIL/uL — AB (ref 4.22–5.81)
RBC: 2.63 MIL/uL — ABNORMAL LOW (ref 4.22–5.81)
RDW: 18.4 % — ABNORMAL HIGH (ref 11.5–15.5)
RDW: 18.9 % — AB (ref 11.5–15.5)
WBC: 5.2 10*3/uL (ref 4.0–10.5)
WBC: 4.7 10*3/uL (ref 4.0–10.5)

## 2016-10-13 LAB — MAGNESIUM: MAGNESIUM: 2 mg/dL (ref 1.7–2.4)

## 2016-10-13 MED ORDER — SODIUM CHLORIDE 0.9 % IV SOLN
Freq: Once | INTRAVENOUS | Status: AC
  Administered 2016-10-13: 23:00:00 via INTRAVENOUS

## 2016-10-13 MED ORDER — CARVEDILOL 12.5 MG PO TABS
12.5000 mg | ORAL_TABLET | Freq: Two times a day (BID) | ORAL | Status: DC
  Administered 2016-10-14 – 2016-10-15 (×3): 12.5 mg via ORAL
  Filled 2016-10-13 (×3): qty 1

## 2016-10-13 NOTE — Progress Notes (Signed)
PROGRESS NOTE    Mark [name]  UXL:[id number] DOB: [birth year] DOA: 10/11/2016 PCP: [name], NP   Brief Narrative:  76 year old WM PMHx AAA, atrial fibrillation on Coumadin, CAD, COPD, GERD, Renal Cell Carcinoma S/P Nephrectomy, HTN, and Dementia  Who presented with tachycardia and melena. Patient was admitted and discharged on 10/07/16 for confusion w/ slurred speech, at which time an MRI of the brain was negative for acute CVA. Patient was seen by his PCP 6/6 for hospital follow-up, and noted to have a heart rate in the 150s. He was referredto [facility] where he also reported 2-3 days of melena.  In the ED his HR was 140, he was guaiac +, INR 2.3, and his Hgb was 8.3.     Subjective: 6/8  A/O 4, negative CP, negative abdominal pain, negative N/V. Positive melena. States was on Xarelto for his atrial fibrillation, but cost became an issue.   Assessment & Plan:   Principal Problem:   GI bleed Active Problems:   Hyperlipidemia   Depression   Essential hypertension   Coronary atherosclerosis   Abdominal aortic aneurysm (HCC)   Atrial fibrillation, chronic (HCC)   Asthma   Acute on chronic respiratory failure with hypoxia (HCC)   Acute systolic CHF (congestive heart failure) (HCC)   GIB - multiple duodenal ulcers  Now s/p EGD revealing above - cont PPI - clear liquids only for now - advance diet at discretion of GI   Acute blood loss anemia -Hgb dropped from 11.1 to 8.3 in 4 days  - transfused 2U PRBC thus far - keep Hgb > 7.9 w/ hx of CAD Recent Labs     10/11/16  1327  10/11/16  1930  10/12/16  1133  10/13/16  0321  HGB  8.3*  7.2*  8.2*  7.5*  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 Protonix not compatible with Cardizem drip. -Increase Coreg 12.5 mg BID  Chronic systolic heart failure without exacerbation -EF of 20-25% on 08/15/16 - no clinical evidence of volume overload presently - developed transient hypoxia/pulm edema last PM likely due to transfusion which quickly resolved w/ lasix -Strict in and out -Daily weight  Chronic kidney disease stage III(Cr baseline 1.8) Lab Results  Component Value Date   CREATININE 1.87 (H) 10/13/2016   CREATININE 2.06 (H) 10/12/2016   CREATININE 2.15 (H) 10/11/2016   - currently near baseline - follow trend   Vascular Dementia without behavioral disturbance -Advanced 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  -Per note family reports progressive cognitive decline and feel he is no longer safe to live on his own, however patient appears to be competent - PT/OT to see  Hyperlipidemia continue statin  BPH continue flomax  GERD  Depression continue zoloft  Hyperlipidemia continue lipitor      DVT prophylaxis: SCDs Code Status: FULL CODE Family Communication:  none  Disposition Plan: SDU - PT/OT - follow HGB - eval AAA   Consultants:  GI Dr. Wilfrid Lund  Procedures/Significant Events:  6/7 EGD Small hiatal hernia.- Moderate Schatzki ring. - Multiple non-bleeding duodenal ulcers with  pigmented material. 6/8 transfusion 1 unit PRBC   VENTILATOR SETTINGS: None   Cultures None  Antimicrobials: Anti-infectives    None       Devices None   LINES / TUBES:      Continuous Infusions: . sodium chloride 10 mL/hr at 10/12/16 1054  . diltiazem (CARDIZEM) infusion 5 mg/hr (10/13/16 0209)  . pantoprozole (PROTONIX) infusion 8 mg/hr (10/13/16 0318)     Objective: Vitals:   10/13/16 0401  10/13/16 0500 10/13/16 0600 10/13/16 0807  BP: 126/82 119/86 117/83 115/82  Pulse: 96 65 65   Resp: 18 18 (!) 28 (!) 24  Temp: 98.1 F (36.7 C)   (!) 96.9 F (36.1 C)  TempSrc: Oral   Axillary  SpO2: 96% 96% 92%   Weight:      Height:        Intake/Output Summary (Last 24 hours) at 10/13/16 0819 Last data filed at 10/13/16 0600  Gross per 24 hour  Intake          1658.42 ml  Output             1550 ml  Net           108.42 ml   Filed Weights   10/11/16 1324  Weight: 210 lb (95.3 kg)    Examination:  General: A/O 4, NAD, No acute respiratory distress, sitting in chair Eyes: negative scleral hemorrhage, negative anisocoria, negative icterus ENT: Negative Runny nose, negative gingival bleeding, Neck:  Negative scars, masses, torticollis, lymphadenopathy, JVD Lungs: Clear to auscultation bilaterally without wheezes or crackles Cardiovascular: Irregular irregular rhythm and rate, without murmur gallop or rub normal S1 and S2 Abdomen: negative abdominal pain, nondistended, positive soft, bowel sounds, no rebound, no ascites, no appreciable mass Extremities: No significant cyanosis, clubbing, or edema bilateral lower extremities Skin: Negative rashes, lesions, ulcers Psychiatric:  Negative depression, negative anxiety, negative fatigue, negative mania  Central nervous system:  Cranial nerves II through XII intact, tongue/uvula midline, all extremities muscle strength 5/5, sensation intact throughout, negative dysarthria, negative expressive aphasia, negative receptive aphasia.  .     Data Reviewed: Care during the described time interval was provided by me .  I have reviewed this patient's available data, including medical history, events of note, physical examination, and all test results as part of my evaluation. I have personally reviewed and interpreted all radiology studies.  CBC:  Recent Labs Lab 10/07/16 0819 10/11/16 1327 10/11/16 1930 10/12/16 1133  10/13/16 0321  WBC 5.0 7.1 5.7 6.7 5.2  NEUTROABS  --  5.6  --   --   --   HGB 11.1* 8.3* 7.2* 8.2* 7.5*  HCT 35.3* 26.3* 23.0* 25.8* 23.9*  MCV 93.9 96.3 94.3 91.8 93.0  PLT 107* 138* 121* 86* 80*   Basic Metabolic Panel:  Recent Labs Lab 10/06/16 1859 10/07/16 0819 10/11/16 1327 10/11/16 1930 10/12/16 1133  NA  --  141 141  --  142  K  --  4.2 3.8  --  3.2*  CL  --  109 111  --  111  CO2  --  25 20*  --  20*  GLUCOSE  --  130* 112*  --  135*  BUN  --  56* 85*  --  74*  CREATININE  --  2.55* 2.15*  --  2.06*  CALCIUM  --  8.6* 8.4*  --  8.1*  MG 2.3  --   --  2.2  --    GFR: Estimated Creatinine Clearance: 37.6 mL/min (A) (by C-G formula based on SCr of 2.06 mg/dL (H)). Liver Function Tests:  Recent Labs Lab 10/11/16 1327 10/12/16 1133  AST 24 23  ALT 26 23  ALKPHOS 56 53  BILITOT 1.2 2.0*  PROT 5.4* 4.9*  ALBUMIN 2.7* 2.5*   No results for input(s): LIPASE, AMYLASE in the last 168 hours. No results for input(s): AMMONIA in the last 168 hours. Coagulation Profile:  Recent Labs Lab 10/07/16 0819 10/11/16 1327 10/11/16 1930 10/12/16 1133 10/13/16 0321  INR 2.41 2.24 1.45 1.33 1.33   Cardiac Enzymes:  Recent Labs Lab 10/11/16 1327 10/11/16 1930 10/12/16 1133  TROPONINI 0.12* 0.16* 0.70*   BNP (last 3 results) No results for input(s): PROBNP in the last 8760 hours. HbA1C: No results for input(s): HGBA1C in the last 72 hours. CBG:  Recent Labs Lab 10/07/16 1627  GLUCAP 106*   Lipid Profile: No results for input(s): CHOL, HDL, LDLCALC, TRIG, CHOLHDL, LDLDIRECT in the last 72 hours. Thyroid Function Tests: No results for input(s): TSH, T4TOTAL, FREET4, T3FREE, THYROIDAB in the last 72 hours. Anemia Panel: No results for input(s): VITAMINB12, FOLATE, FERRITIN, TIBC, IRON, RETICCTPCT in the last 72 hours. Urine analysis:    Component Value Date/Time   COLORURINE YELLOW 10/06/2016 [place] 10/06/2016 0743   LABSPEC 1.013  10/06/2016 0743   PHURINE 5.0 10/06/2016 0743   GLUCOSEU NEGATIVE 10/06/2016 0743   GLUCOSEU NEGATIVE 07/17/2014 0945   HGBUR NEGATIVE 10/06/2016 0743   BILIRUBINUR NEGATIVE 10/06/2016 0743   KETONESUR NEGATIVE 10/06/2016 0743   PROTEINUR 30 (A) 10/06/2016 0743   UROBILINOGEN 0.2 07/17/2014 0945   NITRITE NEGATIVE 10/06/2016 0743   LEUKOCYTESUR TRACE (A) 10/06/2016 0743   Sepsis Labs: @LABRCNTIP (procalcitonin:4,lacticidven:4)  ) Recent Results (from the past 240 hour(s))  MRSA PCR Screening     Status: None   Collection Time: 10/11/16  5:55 PM  Result Value Ref Range Status   MRSA by PCR NEGATIVE NEGATIVE Final    Comment:        The GeneXpert MRSA Assay (FDA approved for NASAL specimens only), is one component of a comprehensive MRSA colonization surveillance program. It is not intended to diagnose MRSA infection nor to guide or monitor treatment for MRSA infections.          Radiology Studies: US Aorta  Result Date: 10/13/2016 CLINICAL DATA:  Abdominal aortic aneurysm AA status post stent graft repair EXAM: ULTRASOUND OF ABDOMINAL AORTA TECHNIQUE: Ultrasound examination of the abdominal aorta was performed to evaluate for abdominal aortic aneurysm. COMPARISON:  12/28/2015 CT FINDINGS: Abdominal aortic measurements as follows: Proximal:  3.1 cm Mid:  9.4 cm Distal:  7.8 cm IMPRESSION: Compare to the prior study, max small sac diameter has increased from 8.8 cm to 9.4 cm. It is difficult to determine if this represents interval growth or simply inaccurate comparison due to differences in modality. CT of the abdomen would be a more accurate measure of a direct comparison to the prior study. Interval growth of the aneurysm sac cannot be excluded. Electronically Signed   By: Marybelle Killings M.D.   On: 10/13/2016 07:08   Dg Chest Port 1 View  Result Date: 10/12/2016 CLINICAL DATA:  Acute respiratory distress. EXAM: PORTABLE CHEST 1 VIEW COMPARISON:  Frontal and lateral views  10/06/2016 FINDINGS: The heart is enlarged. Unchanged mediastinal contours. Development of multifocal opacities throughout the right lung. Probable  retrocardiac opacity. There is vascular congestion and mild peribronchial cuffing. Difficult to exclude pleural effusions. No pneumothorax. IMPRESSION: Development of multifocal opacities throughout the right lung and retrocardiac left lung base, may be pulmonary edema, pneumonia or aspiration. Difficult to exclude pleural effusions. Cardiomegaly with vascular congestion. Electronically Signed   By: Jeb Levering M.D.   On: 10/12/2016 04:20        Scheduled Meds: . atorvastatin  40 mg Oral Daily  . carvedilol  6.25 mg Oral BID WC  . isosorbide dinitrate  20 mg Oral TID  . sertraline  100 mg Oral QPM  . sodium chloride flush  3 mL Intravenous Q12H  . tamsulosin  0.4 mg Oral QPC breakfast   Continuous Infusions: . sodium chloride 10 mL/hr at 10/12/16 1054  . diltiazem (CARDIZEM) infusion 5 mg/hr (10/13/16 0209)  . pantoprozole (PROTONIX) infusion 8 mg/hr (10/13/16 0318)     LOS: 2 days    Time spent: 40 minutes    WOODS, Geraldo Docker, MD [facility] Pager [phone]   If 7PM-7AM, please contact night-coverage [email or web address] Password [facility] 10/13/2016, 8:19 AM

## 2016-10-13 NOTE — Telephone Encounter (Signed)
Received fax from [facility] that they have been unable to reach pt to do a start of care. Faxed response that pt has been readmitted to the hospital, # [phone].

## 2016-10-13 NOTE — Evaluation (Signed)
Physical Therapy Evaluation Patient Details Name: Mark [name] MRN: [id number] DOB: [birth year] Today's Date: 10/13/2016   History of Present Illness  Pt admitted with GIB. PMH: Recent admission after being found down outside of his home with weakness and AMS, CVA, shingles, OA, migraines, HTN, COPD on 3L, CHF, Depression, CKD, CAD, AAA.  Clinical Impression  Pt admitted with above diagnosis and presents to PT with functional limitations due to deficits listed below (See PT problem list). Pt needs skilled PT to maximize independence and safety to allow discharge to ST-SNF. Pt currently too weak to return home alone. Pt has mentioned to OT possible eventually going to ALF.     Follow Up Recommendations SNF    Equipment Recommendations  Rolling walker with 5" wheels    Recommendations for Other Services       Precautions / Restrictions Precautions Precautions: Fall Restrictions Weight Bearing Restrictions: No      Mobility  Bed Mobility               General bed mobility comments: Pt up in chair  Transfers Overall transfer level: Needs assistance Equipment used: Rolling walker (2 wheeled) Transfers: Sit to/from Stand Sit to Stand: Min assist         General transfer comment: Assist for balance and verbal/tactile cues for hand placement and pt still pulling up on walker  Ambulation/Gait Ambulation/Gait assistance: Min assist Ambulation Distance (Feet): 100 Feet Assistive device: Rolling walker (2 wheeled) Gait Pattern/deviations: Step-through pattern;Decreased stride length;Staggering left;Staggering right Gait velocity: decr Gait velocity interpretation: Below normal speed for age/gender General Gait Details: Unsteady gait even with rolling walker with pt fatiguing quickly. Assist for balance and support. Amb on 3L of O2 with SpO2 > 90%.  Stairs            Wheelchair Mobility    Modified Rankin (Stroke Patients Only)       Balance Overall  balance assessment: Needs assistance Sitting-balance support: Feet supported Sitting balance-Leahy Scale: Good     Standing balance support: Single extremity supported Standing balance-Leahy Scale: Poor Standing balance comment: walker and min guard for static standing                             Pertinent Vitals/Pain Pain Assessment: No/denies pain    Home Living Family/patient expects to be discharged to:: Private residence Living Arrangements: Alone   Type of Home: Other(Comment) (condo) Home Access: Stairs to enter Entrance Stairs-Rails: Right;Left Entrance Stairs-Number of Steps: 3 flights Home Layout: One level Home Equipment: Shower seat - built in;Other (comment) (02) Additional Comments: son with concerns about pt living alone during last admission    Prior Function Level of Independence: Independent         Comments: uses the bus for transportation, walks without a device     Hand Dominance   Dominant Hand: Right    Extremity/Trunk Assessment   Upper Extremity Assessment Upper Extremity Assessment: Defer to OT evaluation    Lower Extremity Assessment Lower Extremity Assessment: Generalized weakness    Cervical / Trunk Assessment Cervical / Trunk Assessment: Normal  Communication   Communication: No difficulties  Cognition Arousal/Alertness: Awake/alert Behavior During Therapy: WFL for tasks assessed/performed Overall Cognitive Status: No family/caregiver present to determine baseline cognitive functioning Area of Impairment: Safety/judgement                         Safety/Judgement: Decreased awareness  of deficits     General Comments:  Pt intelligent and can discuss medical history in detail but at times shows impaired cognition. While amb began to fatigue and asked to sit in a chair that was in another pt's room not realizing it was a patient room. Also didn't understand why he can't go to the bathroom alone. Pointed out  to him he has heart monitor, O2, and IV lines in addition to being weak.      General Comments      Exercises     Assessment/Plan    PT Assessment Patient needs continued PT services  PT Problem List Decreased strength;Decreased activity tolerance;Decreased balance;Decreased mobility;Decreased knowledge of use of DME       PT Treatment Interventions DME instruction;Gait training;Stair training;Functional mobility training;Therapeutic activities;Therapeutic exercise;Balance training;Patient/family education    PT Goals (Current goals can be found in the Care Plan section)  Acute Rehab PT Goals Patient Stated Goal: To eventually go into assisted living. PT Goal Formulation: With patient Time For Goal Achievement: 10/20/16 Potential to Achieve Goals: Good    Frequency Min 3X/week   Barriers to discharge Inaccessible home environment;Decreased caregiver support lives alone on 3rd floor condo with no elevator    Co-evaluation               AM-PAC PT "6 Clicks" Daily Activity  Outcome Measure Difficulty turning over in bed (including adjusting bedclothes, sheets and blankets)?: A Little Difficulty moving from lying on back to sitting on the side of the bed? : A Little Difficulty sitting down on and standing up from a chair with arms (e.g., wheelchair, bedside commode, etc,.)?: Total Help needed moving to and from a bed to chair (including a wheelchair)?: A Little Help needed walking in hospital room?: A Little Help needed climbing 3-5 steps with a railing? : Total 6 Click Score: 14    End of Session Equipment Utilized During Treatment: Oxygen Activity Tolerance: Patient limited by fatigue Patient left: in chair;with call bell/phone within reach (Pt sitting in recliner outside of room per his request) Nurse Communication: Mobility status PT Visit Diagnosis: Unsteadiness on feet (R26.81);Muscle weakness (generalized) (M62.81);Difficulty in walking, not elsewhere  classified (R26.2);History of falling (Z91.81)    Time: 9518-8416 PT Time Calculation (min) (ACUTE ONLY): 22 min   Charges:   PT Evaluation $PT Eval Moderate Complexity: 1 Procedure     PT G CodesMarland Kitchen        [facility] [place] 10/13/2016, 3:27 PM

## 2016-10-13 NOTE — Evaluation (Signed)
Occupational Therapy Evaluation Patient Details Name: Mark [name] MRN: [id number] DOB: 1940/10/06 Today's Date: 10/13/2016    History of Present Illness Pt admitted with GIB. PMH: Recent admission after being found down outside of his home with weakness and AMS, CVA, shingles, OA, migraines, HTN, COPD on 3L, CHF, Depression, CKD, CAD, AAA.   Clinical Impression   Pt was living alone in third floor condo, using public transportation prior to admission. Son with concerns about pt's safety during recent admission. Presents with impaired balance and decreased activity tolerance. Struggles with LB ADL. Pt is functioning at a min assist level with RW in ADL. Recommending SNF for further rehab upon discharge. Pt is hopeful for long term plan of ALF.    Follow Up Recommendations  SNF    Equipment Recommendations  None recommended by OT    Recommendations for Other Services       Precautions / Restrictions Precautions Precautions: Fall Restrictions Weight Bearing Restrictions: No      Mobility Bed Mobility               General bed mobility comments: pt received on toilet  Transfers Overall transfer level: Needs assistance Equipment used: Rolling walker (2 wheeled) Transfers: Sit to/from Stand Sit to Stand: Min guard         General transfer comment: increased time, use of rail, cues for hand placement on walker    Balance Overall balance assessment: Needs assistance Sitting-balance support: Feet supported Sitting balance-Leahy Scale: Good       Standing balance-Leahy Scale: Poor Standing balance comment: requires UE support for ambulation                           ADL either performed or assessed with clinical judgement   ADL Overall ADL's : Needs assistance/impaired Eating/Feeding: Independent;Sitting   Grooming: Wash/dry hands;Sitting;Set up   Upper Body Bathing: Set up;Sitting   Lower Body Bathing: Minimal assistance;Sit to/from stand    Upper Body Dressing : Set up;Sitting   Lower Body Dressing: Minimal assistance;Sit to/from stand   Toilet Transfer: Minimal assistance;Ambulation;RW;Regular Toilet;Grab bars   Toileting- Clothing Manipulation and Hygiene: Minimal assistance;Sit to/from stand       Functional mobility during ADLs: Minimal assistance;Rolling walker General ADL Comments: Pt with generalized weakness, current hgb of 7.5. Pt with blood in stool, RN made aware.     Vision Baseline Vision/History: Wears glasses Wears Glasses: Reading only Patient Visual Report: No change from baseline Vision Assessment?: No apparent visual deficits     Perception     Praxis      Pertinent Vitals/Pain Pain Assessment: No/denies pain     Hand Dominance Right   Extremity/Trunk Assessment Upper Extremity Assessment Upper Extremity Assessment: Overall WFL for tasks assessed (arthritic changes in hands)   Lower Extremity Assessment Lower Extremity Assessment: Defer to PT evaluation   Cervical / Trunk Assessment Cervical / Trunk Assessment: Normal   Communication Communication Communication: No difficulties   Cognition Arousal/Alertness: Awake/alert Behavior During Therapy: WFL for tasks assessed/performed Overall Cognitive Status: Within Functional Limits for tasks assessed                                 General Comments: Pt thinks he needs assisted living, struggling to manage alone at home.   General Comments       Exercises     Shoulder Instructions  Home Living Family/patient expects to be discharged to:: Private residence Living Arrangements: Alone   Type of Home: Other(Comment) (condo) Home Access: Stairs to enter Entrance Stairs-Number of Steps: 3 flights Entrance Stairs-Rails: Right;Left Home Layout: One level     Bathroom Shower/Tub: Tub/shower unit;Walk-in shower   Bathroom Toilet: Handicapped height     Home Equipment: Shower seat - built in;Other (comment)  (02)   Additional Comments: son with concerns about pt living alone during last admission  Lives With: Alone    Prior Functioning/Environment Level of Independence: Independent        Comments: uses the bus for transportation, walks without a device        OT Problem List: Decreased strength;Decreased activity tolerance;Decreased knowledge of use of DME or AE;Decreased safety awareness;Cardiopulmonary status limiting activity      OT Treatment/Interventions: Self-care/ADL training;Energy conservation;DME and/or AE instruction;Therapeutic activities;Patient/family education;Balance training    OT Goals(Current goals can be found in the care plan section) Acute Rehab OT Goals Patient Stated Goal: To eventually go into assisted living. OT Goal Formulation: With patient Time For Goal Achievement: 10/27/16 Potential to Achieve Goals: Good ADL Goals Pt Will Perform Grooming: with supervision;standing (3 activities) Pt Will Perform Lower Body Bathing: with supervision;sit to/from stand;with adaptive equipment Pt Will Perform Lower Body Dressing: with supervision;with adaptive equipment;sit to/from stand Pt Will Transfer to Toilet: with supervision;ambulating;bedside commode (over toilet) Pt Will Perform Toileting - Clothing Manipulation and hygiene: with supervision;sit to/from stand Additional ADL Goal #1: Pt will generalize energy conservation strategies in ADL and mobility independently.  OT Frequency: Min 2X/week   Barriers to D/C: Decreased caregiver support;Inaccessible home environment          Co-evaluation              AM-PAC PT "6 Clicks" Daily Activity     Outcome Measure Help from another person eating meals?: None Help from another person taking care of personal grooming?: A Little Help from another person toileting, which includes using toliet, bedpan, or urinal?: A Little Help from another person bathing (including washing, rinsing, drying)?: A Little Help  from another person to put on and taking off regular upper body clothing?: None Help from another person to put on and taking off regular lower body clothing?: A Little 6 Click Score: 20   End of Session Equipment Utilized During Treatment: Rolling walker;Oxygen (4L) Nurse Communication: Mobility status (aware of IV bleeding, ok to give diet coke)  Activity Tolerance: Patient tolerated treatment well Patient left: in chair;with call bell/phone within reach;with nursing/sitter in room  OT Visit Diagnosis: Unsteadiness on feet (R26.81);History of falling (Z91.81)                Time: 1829-9371 OT Time Calculation (min): 37 min Charges:  OT General Charges $OT Visit: 1 Procedure OT Evaluation $OT Eval Moderate Complexity: 1 Procedure OT Treatments $Self Care/Home Management : 8-22 mins G-Codes:      Malka So 10/13/2016, 2:27 PM  [phone]

## 2016-10-13 NOTE — Progress Notes (Signed)
   10/13/16 0900  Clinical Encounter Type  Visited With Patient and family together;Health care provider  Visit Type Initial;Spiritual support  Referral From Nurse  Consult/Referral To Chaplain  Spiritual Encounters  Spiritual Needs Literature;Prayer;Emotional  Stress Factors  Patient Stress Factors Health changes;Lack of knowledge  Family Stress Factors Family relationships;Health changes;Lack of knowledge    Chaplain responded to [place] consult for [facility]. Completed paperwork and provided education with patient and adult son. Original with chart spoke with nurse will make copy for chart and give original to patient who is being discharged. Provided ministry of presence and prayer. Sigmond Patalano L. Volanda Napoleon, MDiv

## 2016-10-13 NOTE — Progress Notes (Signed)
Physical Therapy Treatment Patient Details Name: Mark [name] MRN: [id number] DOB: [birth year] Today's Date: 10/13/2016    History of Present Illness Pt admitted with GIB. PMH: Recent admission after being found down outside of his home with weakness and AMS, CVA, shingles, OA, migraines, HTN, COPD on 3L, CHF, Depression, CKD, CAD, AAA.    PT Comments    Pt seen again when requesting back to bed due to fatigue. Focused on safety with transfers and correct use of walker.    Follow Up Recommendations  SNF     Equipment Recommendations  Rolling walker with 5" wheels    Recommendations for Other Services       Precautions / Restrictions Precautions Precautions: Fall Restrictions Weight Bearing Restrictions: No    Mobility  Bed Mobility Overal bed mobility: Needs Assistance Bed Mobility: Sit to Supine       Sit to supine: Supervision   General bed mobility comments: Supervision for lines  Transfers Overall transfer level: Needs assistance Equipment used: Rolling walker (2 wheeled) Transfers: Sit to/from Stand Sit to Stand: Min guard         General transfer comment: Verbal cues for hand placement with pt this time following directions and able to stand with min guard.  Ambulation/Gait Ambulation/Gait assistance: Min assist Ambulation Distance (Feet): 5 Feet Assistive device: Rolling walker (2 wheeled) Gait Pattern/deviations: Step-through pattern;Decreased stride length;Staggering left;Staggering right Gait velocity: decr Gait velocity interpretation: Below normal speed for age/gender General Gait Details: Unsteady gait even with rolling walker with pt fatiguing quickly. Assist for balance and support. Amb on 3L of O2 with SpO2 > 90%.   Stairs            Wheelchair Mobility    Modified Rankin (Stroke Patients Only)       Balance Overall balance assessment: Needs assistance Sitting-balance support: Feet supported Sitting balance-Leahy Scale:  Good     Standing balance support: Single extremity supported Standing balance-Leahy Scale: Poor Standing balance comment: walker and min guard for static standing                            Cognition Arousal/Alertness: Awake/alert Behavior During Therapy: WFL for tasks assessed/performed Overall Cognitive Status: No family/caregiver present to determine baseline cognitive functioning Area of Impairment: Safety/judgement                         Safety/Judgement: Decreased awareness of deficits     General Comments: Pt asking to go back into room from sitting in the hall stating "It looks like they are all wrapped up out here."      Exercises      General Comments        Pertinent Vitals/Pain Pain Assessment: No/denies pain    Home Living Family/patient expects to be discharged to:: Private residence Living Arrangements: Alone   Type of Home: Other(Comment) (condo) Home Access: Stairs to enter Entrance Stairs-Rails: Right;Left Home Layout: One level Home Equipment: Shower seat - built in;Other (comment) (02) Additional Comments: son with concerns about pt living alone during last admission    Prior Function Level of Independence: Independent      Comments: uses the bus for transportation, walks without a device   PT Goals (current goals can now be found in the care plan section) Acute Rehab PT Goals Patient Stated Goal: To eventually go into assisted living. PT Goal Formulation: With patient Time For Goal Achievement:  10/20/16 Potential to Achieve Goals: Good Progress towards PT goals: Progressing toward goals    Frequency    Min 3X/week      PT Plan Current plan remains appropriate    Co-evaluation              AM-PAC PT "6 Clicks" Daily Activity  Outcome Measure  Difficulty turning over in bed (including adjusting bedclothes, sheets and blankets)?: A Little Difficulty moving from lying on back to sitting on the side of  the bed? : A Little Difficulty sitting down on and standing up from a chair with arms (e.g., wheelchair, bedside commode, etc,.)?: Total Help needed moving to and from a bed to chair (including a wheelchair)?: A Little Help needed walking in hospital room?: A Little Help needed climbing 3-5 steps with a railing? : Total 6 Click Score: 14    End of Session Equipment Utilized During Treatment: Oxygen Activity Tolerance: Patient limited by fatigue Patient left: with call bell/phone within reach;in bed;with bed alarm set;with nursing/sitter in room (Pt sitting in recliner outside of room per his request) Nurse Communication: Mobility status PT Visit Diagnosis: Unsteadiness on feet (R26.81);Muscle weakness (generalized) (M62.81);Difficulty in walking, not elsewhere classified (R26.2);History of falling (Z91.81)     Time: 8916-9450 PT Time Calculation (min) (ACUTE ONLY): 10 min  Charges:  $Gait Training: 8-22 mins                    G Codes:       [facility] PT Redbird Smith 10/13/2016, 4:49 PM

## 2016-10-13 NOTE — Progress Notes (Signed)
Daily Rounding Note  10/13/2016, 8:02 AM  LOS: 2 days   SUBJECTIVE:   Chief complaint: Bloody, melenic stools. He had at least one perhaps 2 further episodes overnight. He denies nausea and abdominal pain. He's feeling a bit weak. Hasn't been out of bed. Tolerating clears.   OBJECTIVE:         Vital signs in last 24 hours:    Temp:  [97.6 F (36.4 C)-98.4 F (36.9 C)] 98.1 F (36.7 C) (06/08 0401) Pulse Rate:  [38-98] 65 (06/08 0600) Resp:  [13-28] 28 (06/08 0600) BP: (96-135)/(61-98) 117/83 (06/08 0600) SpO2:  [92 %-100 %] 92 % (06/08 0600) Last BM Date: 10/12/16 Filed Weights   10/11/16 1324  Weight: 95.3 kg (210 lb)   General:  Pleasant, comfortable. Looks somewhat chronically ill.   Heart:   RRR, no MRG Chest:  Decreased breath sounds on the right. No adventitious sounds. No labored breathing or cough. Abdomen: not tender or distended. Active bowel sounds.  Extremities: no CCE. Neuro/Psych:  cooperative. Moves all 4 limbs. Alert and oriented to place, situation.  Intake/Output from previous day: 06/07 0701 - 06/08 0700 In: 1658.4 [P.O.:480; I.V.:1178.4] Out: 1950 [Urine:1950]  Intake/Output this shift: No intake/output data recorded.  Lab Results:  Recent Labs  10/11/16 1930 10/12/16 1133 10/13/16 0321  WBC 5.7 6.7 5.2  HGB 7.2* 8.2* 7.5*  HCT 23.0* 25.8* 23.9*  PLT 121* 86* 80*   BMET  Recent Labs  10/11/16 1327 10/12/16 1133  NA 141 142  K 3.8 3.2*  CL 111 111  CO2 20* 20*  GLUCOSE 112* 135*  BUN 85* 74*  CREATININE 2.15* 2.06*  CALCIUM 8.4* 8.1*   LFT  Recent Labs  10/11/16 1327 10/12/16 1133  PROT 5.4* 4.9*  ALBUMIN 2.7* 2.5*  AST 24 23  ALT 26 23  ALKPHOS 56 53  BILITOT 1.2 2.0*   PT/INR  Recent Labs  10/12/16 1133 10/13/16 0321  LABPROT 16.6* 16.6*  INR 1.33 1.33   Hepatitis Panel No results for input(s): HEPBSAG, HCVAB, HEPAIGM, HEPBIGM in the last 72  hours.  Studies/Results: US Aorta  Result Date: 10/13/2016 CLINICAL DATA:  Abdominal aortic aneurysm AA status post stent graft repair EXAM: ULTRASOUND OF ABDOMINAL AORTA TECHNIQUE: Ultrasound examination of the abdominal aorta was performed to evaluate for abdominal aortic aneurysm. COMPARISON:  12/28/2015 CT FINDINGS: Abdominal aortic measurements as follows: Proximal:  3.1 cm Mid:  9.4 cm Distal:  7.8 cm IMPRESSION: Compare to the prior study, max small sac diameter has increased from 8.8 cm to 9.4 cm. It is difficult to determine if this represents interval growth or simply inaccurate comparison due to differences in modality. CT of the abdomen would be a more accurate measure of a direct comparison to the prior study. Interval growth of the aneurysm sac cannot be excluded. Electronically Signed   By: Marybelle Killings M.D.   On: 10/13/2016 07:08   Dg Chest Port 1 View  Result Date: 10/12/2016 CLINICAL DATA:  Acute respiratory distress. EXAM: PORTABLE CHEST 1 VIEW COMPARISON:  Frontal and lateral views 10/06/2016 FINDINGS: The heart is enlarged. Unchanged mediastinal contours. Development of multifocal opacities throughout the right lung. Probable retrocardiac opacity. There is vascular congestion and mild peribronchial cuffing. Difficult to exclude pleural effusions. No pneumothorax. IMPRESSION: Development of multifocal opacities throughout the right lung and retrocardiac left lung base, may be pulmonary edema, pneumonia or aspiration. Difficult to exclude pleural effusions. Cardiomegaly with vascular congestion.  Electronically Signed   By: Jeb Levering M.D.   On: 10/12/2016 04:20   Scheduled Meds: . atorvastatin  40 mg Oral Daily  . carvedilol  6.25 mg Oral BID WC  . isosorbide dinitrate  20 mg Oral TID  . sertraline  100 mg Oral QPM  . sodium chloride flush  3 mL Intravenous Q12H  . tamsulosin  0.4 mg Oral QPC breakfast   Continuous Infusions: . sodium chloride 10 mL/hr at 10/12/16 1054  .  diltiazem (CARDIZEM) infusion 5 mg/hr (10/13/16 0209)  . pantoprozole (PROTONIX) infusion 8 mg/hr (10/13/16 0318)   PRN Meds:.acetaminophen, levalbuterol, ondansetron **OR** ondansetron (ZOFRAN) IV   ASSESMENT:   *  GIB with melena, anemia.  Question whether he is still actively bleeding or whether he is simply eliminating old blood. EGD 6/7: multiple non-bleeding duodenal ulcers with pigmented material.  HH.  Schatzki ring.  Day 2/3protonix drip.  No ASA, NSAIDs PTA Serum H Pylori Ab is in negative range.  *  Chronic Coumadin for A fib.  On hold. INR 2.2, reversed with K centra to 1.3  *  Blood loss anemia with rapid afib.  PRBC x 2. Hgb 8.2 >> 7.5 in previous 4 hours.     *  Thrombocytopenia, non-critical.     PLAN   *  CBC this afternoon and in AM.  Keep patient on clear liquids and continue the 72 hours of Protonix drip.    Azucena Freed  10/13/2016, 8:02 AM Pager: [phone]   Attending physician's note   I have taken an interval history, reviewed the chart and examined the patient. I agree with the Advanced Practitioner's note, impression and recommendations.   Patient is likely passing residual blood. Continue to monitor Hgb and transfuse as needed Continue PPI gtt for 72 hours H.pylori negative Advance diet to full liquids No plan for repeat EGD at this point  Damaris Hippo, MD [phone] Mon-Fri 8a-5p [phone] after 5p, weekends, holidays

## 2016-10-14 DIAGNOSIS — K264 Chronic or unspecified duodenal ulcer with hemorrhage: Principal | ICD-10-CM

## 2016-10-14 DIAGNOSIS — I5021 Acute systolic (congestive) heart failure: Secondary | ICD-10-CM

## 2016-10-14 LAB — BASIC METABOLIC PANEL
Anion gap: 5 (ref 5–15)
BUN: 44 mg/dL — AB (ref 6–20)
CALCIUM: 8.2 mg/dL — AB (ref 8.9–10.3)
CO2: 23 mmol/L (ref 22–32)
CREATININE: 1.89 mg/dL — AB (ref 0.61–1.24)
Chloride: 111 mmol/L (ref 101–111)
GFR calc Af Amer: 39 mL/min — ABNORMAL LOW (ref >60)
GFR calc non Af Amer: 34 mL/min — ABNORMAL LOW (ref >60)
GLUCOSE: 99 mg/dL (ref 65–99)
Potassium: 3.3 mmol/L — ABNORMAL LOW (ref 3.5–5.1)
Sodium: 139 mmol/L (ref 135–145)

## 2016-10-14 LAB — MAGNESIUM: Magnesium: 1.9 mg/dL (ref 1.7–2.4)

## 2016-10-14 LAB — HEMOGLOBIN AND HEMATOCRIT, BLOOD
HCT: 24.6 % — ABNORMAL LOW (ref 39.0–52.0)
Hemoglobin: 7.8 g/dL — ABNORMAL LOW (ref 13.0–17.0)

## 2016-10-14 LAB — PREPARE RBC (CROSSMATCH)

## 2016-10-14 MED ORDER — PANTOPRAZOLE SODIUM 40 MG PO TBEC
40.0000 mg | DELAYED_RELEASE_TABLET | Freq: Two times a day (BID) | ORAL | Status: DC
  Administered 2016-10-15 – 2016-10-26 (×23): 40 mg via ORAL
  Filled 2016-10-14 (×23): qty 1

## 2016-10-14 MED ORDER — POTASSIUM CHLORIDE CRYS ER 20 MEQ PO TBCR
40.0000 meq | EXTENDED_RELEASE_TABLET | Freq: Two times a day (BID) | ORAL | Status: AC
  Administered 2016-10-14 – 2016-10-15 (×3): 40 meq via ORAL
  Filled 2016-10-14 (×3): qty 2

## 2016-10-14 MED ORDER — SODIUM CHLORIDE 0.9 % IV SOLN: Freq: Once | INTRAVENOUS | Status: DC

## 2016-10-14 MED ORDER — FUROSEMIDE 40 MG PO TABS
60.0000 mg | ORAL_TABLET | Freq: Every day | ORAL | Status: DC
  Administered 2016-10-14 – 2016-10-17 (×4): 60 mg via ORAL
  Filled 2016-10-14 (×5): qty 1

## 2016-10-14 NOTE — Progress Notes (Signed)
[place] TEAM 1 - Stepdown/ICU TEAM  Mark [name]  HYW:[id number] DOB: 09/23/1940 DOA: 10/11/2016 PCP: [name], NP    Brief Narrative:  76 year old male with hx of AAA, atrial fibrillation on Coumadin, CAD, COPD, GERD, renal cell carcinoma status post nephrectomy, HTN, and dementia who presented with tachycardia and melena. Patient was admitted and discharged on 10/07/16 for confusion w/ slurred speech, at which time an MRI of the brain was negative for acute CVA. Patient was seen by his PCP 6/6 for hospital follow-up, and noted to have a heart rate in the 150s. He was referred to [facility] where he also reported 2-3 days of melena.  In the ED his HR was 140, he was guaiac +, INR 2.3, and his Hgb was 8.3.    Subjective: The pt is not tolerating his regular diet very well at present, reporting that it is giving him significant nausea.  He denies cp, sob, f/c, or abdom pain.    Assessment & Plan:  GIB - multiple duodenal ulcers  Now s/p EGD revealing above - to complete PPI infusion tonight - H pylori screen negative - BID PPI after gtt completed   Acute blood loss anemia Hgb dropped from 11.1 to 8.3 in 4 days - transfused 2U PRBC thus far - keep Hgb > 7.9 w/ hx of CAD - transfuse one additional unit today   Recent Labs Lab 10/11/16 1930 10/12/16 1133 10/13/16 0321 10/13/16 1429 10/14/16 0425  HGB 7.2* 8.2* 7.5* 7.6* 7.8*   9.5cm AAA S/p endograft repair 2011 - pt hx is very poor due to dementia, but he did tell family at one point that he noted copious amounts of BRBPR - [place] of aorta w/o evidence of entero-aortic fistula - no signif BRBPR noted during this hospital stay   CAD s/p PCI to LAD and 1st diag 2006  Keep hemoglobin at 8.0 or greater - denies cp   Chronic kidney disease stage III crt baseline 1.8 - improved to baseline - follow   Recent Labs Lab 10/11/16 1327 10/12/16 1133 10/13/16 0942 10/14/16 0425  CREATININE 2.15* 2.06* 1.87* 1.89*     Permanent Atrial fibrillation with rapid ventricular response anticoag on hold for now - off IV diltiazem and rate reasonably controlled on BB - consider resuming anticoag in 7-10 days if no recurrent bleeding noted   Hypokalemia Replace - check Mg in AM   Dementia Advanced 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 - family reports progressive cognitive decline and feel he is no longer safe to live on his own - PT/OT suggest SNF placement and pt agrees   Hyperlipidemia continue statin  Chronic systolic heart failure without exacerbation EF of 20-25% on 08/15/16 - no clinical evidence of volume overload presently - developed transient hypoxia/pulm edema last PM likely due to transfusion which quickly resolved w/ lasix - weight is trending upward - resume home lasix and NSL IVF once gtts stopped  Filed Weights   10/11/16 1324 10/13/16 1831  Weight: 95.3 kg (210 lb) 98.1 kg (216 lb 3.2 oz)    Essential hypertension Controlled at present  BPH continue flomax  Depression continue zoloft  DVT prophylaxis: SCDs Code Status: FULL CODE Family Communication: No family present at time of exam today Disposition Plan: Transfer to telemetry bed - PT/OT - follow HGB - ultimate dispo is d/c to SNF   Consultants:  [facility]  Procedures: EGD 6/7 - small HH - moderate  Schatzki ring - normal stomach - multiple non-bleeding duodenal ulcers   Antimicrobials:  none  Objective: Blood pressure 119/80, pulse (!) 109, temperature 98.4 F (36.9 C), temperature source Oral, resp. rate 20, height 5\' 11"  (1.803 m), weight 98.1 kg (216 lb 3.2 oz), SpO2 95 %.  Intake/Output Summary (Last 24 hours) at 10/14/16 1428 Last data filed at 10/14/16 1011  Gross per 24 hour  Intake              738 ml  Output              225 ml  Net              513 ml   Filed Weights   10/11/16 1324 10/13/16 1831  Weight: 95.3 kg (210 lb) 98.1 kg (216 lb 3.2 oz)     Examination: General: No acute respiratory distress - alert - mildly confused  Lungs: Clear to auscultation bilaterally - no wheezing  Cardiovascular: Irregularly irregular with rate controlled - no murmur gallop or rub Abdomen: Nontender, nondistended, soft, bowel sounds positive, no rebound Extremities: No significant edema bilateral lower extremities  CBC:  Recent Labs Lab 10/11/16 1327 10/11/16 1930 10/12/16 1133 10/13/16 0321 10/13/16 1429 10/14/16 0425  WBC 7.1 5.7 6.7 5.2 4.7  --   NEUTROABS 5.6  --   --   --   --   --   HGB 8.3* 7.2* 8.2* 7.5* 7.6* 7.8*  HCT 26.3* 23.0* 25.8* 23.9* 25.2* 24.6*  MCV 96.3 94.3 91.8 93.0 95.8  --   PLT 138* 121* 86* 80* 84*  --    Basic Metabolic Panel:  Recent Labs Lab 10/11/16 1327 10/11/16 1930 10/12/16 1133 10/13/16 0942 10/14/16 0425  NA 141  --  142 142 139  K 3.8  --  3.2* 3.2* 3.3*  CL 111  --  111 113* 111  CO2 20*  --  20* 23 23  GLUCOSE 112*  --  135* 105* 99  BUN 85*  --  74* 55* 44*  CREATININE 2.15*  --  2.06* 1.87* 1.89*  CALCIUM 8.4*  --  8.1* 8.3* 8.2*  MG  --  2.2  --  2.0 1.9   GFR: Estimated Creatinine Clearance: 41.6 mL/min (A) (by C-G formula based on SCr of 1.89 mg/dL (H)).  Liver Function Tests:  Recent Labs Lab 10/11/16 1327 10/12/16 1133  AST 24 23  ALT 26 23  ALKPHOS 56 53  BILITOT 1.2 2.0*  PROT 5.4* 4.9*  ALBUMIN 2.7* 2.5*   Coagulation Profile:  Recent Labs Lab 10/11/16 1327 10/11/16 1930 10/12/16 1133 10/13/16 0321  INR 2.24 1.45 1.33 1.33    Cardiac Enzymes:  Recent Labs Lab 10/11/16 1327 10/11/16 1930 10/12/16 1133  TROPONINI 0.12* 0.16* 0.70*    HbA1C: Hgb A1c MFr Bld  Date/Time Value Ref Range Status  10/07/2016 08:19 AM 5.8 (H) 4.8 - 5.6 % Final    Comment:    (NOTE)         Pre-diabetes: 5.7 - 6.4         Diabetes: >6.4         Glycemic control for adults with diabetes: <7.0     CBG:  Recent Labs Lab 10/07/16 1627  GLUCAP 106*    Recent  Results (from the past 240 hour(s))  MRSA PCR Screening     Status: None   Collection Time: 10/11/16  5:55 PM  Result Value Ref Range Status   MRSA by PCR  NEGATIVE NEGATIVE Final    Comment:        The GeneXpert MRSA Assay (FDA approved for NASAL specimens only), is one component of a comprehensive MRSA colonization surveillance program. It is not intended to diagnose MRSA infection nor to guide or monitor treatment for MRSA infections.      Scheduled Meds: . atorvastatin  40 mg Oral Daily  . carvedilol  12.5 mg Oral BID WC  . isosorbide dinitrate  20 mg Oral TID  . [START ON 10/15/2016] pantoprazole  40 mg Oral BID  . sertraline  100 mg Oral QPM  . sodium chloride flush  3 mL Intravenous Q12H  . tamsulosin  0.4 mg Oral QPC breakfast   Continuous Infusions: . sodium chloride 10 mL/hr at 10/12/16 1054  . pantoprozole (PROTONIX) infusion 8 mg/hr (10/14/16 0044)     LOS: 3 days   Cherene Altes, MD [facility] Office  [phone] Pager - Text Page per Amion as per below:  On-Call/Text Page:      [email or web address]      password [email or web address]  If 7PM-7AM, please contact night-coverage [email or web address] Password [email or web address] 10/14/2016, 2:28 PM

## 2016-10-14 NOTE — Progress Notes (Signed)
Daily Rounding Note  10/14/2016, 9:10 AM  LOS: 3 days   SUBJECTIVE:   Chief complaint: small BM yesterday, not grossly bloody.  On full liquids.  No pain.      OBJECTIVE:         Vital signs in last 24 hours:    Temp:  [96.8 F (36 C)-97.7 F (36.5 C)] 97.7 F (36.5 C) (06/09 0223) Pulse Rate:  [62-116] 89 (06/09 0700) Resp:  [17-26] 21 (06/09 0700) BP: (99-132)/(63-94) 131/89 (06/09 0700) SpO2:  [88 %-100 %] 93 % (06/09 0700) Weight:  [98.1 kg (216 lb 3.2 oz)] 98.1 kg (216 lb 3.2 oz) (06/08 1831) Last BM Date: 10/12/16 Filed Weights   10/11/16 1324 10/13/16 1831  Weight: 95.3 kg (210 lb) 98.1 kg (216 lb 3.2 oz)   General: pleasant, comfortable.     Heart: RRR Chest: clear bil.   No SOB Abdomen: soft, NT, active BS.  ND  Extremities: no CCE Neuro/Psych:  Alert, appropriate, oriented x 3.    Intake/Output from previous day: 06/08 0701 - 06/09 0700 In: 1015.2 [I.V.:665.2; Blood:350] Out: 525 [Urine:525]  Intake/Output this shift: No intake/output data recorded.  Lab Results:  Recent Labs  10/12/16 1133 10/13/16 0321 10/13/16 1429 10/14/16 0425  WBC 6.7 5.2 4.7  --   HGB 8.2* 7.5* 7.6* 7.8*  HCT 25.8* 23.9* 25.2* 24.6*  PLT 86* 80* 84*  --    BMET  Recent Labs  10/11/16 1327 10/12/16 1133 10/13/16 0942  NA 141 142 142  K 3.8 3.2* 3.2*  CL 111 111 113*  CO2 20* 20* 23  GLUCOSE 112* 135* 105*  BUN 85* 74* 55*  CREATININE 2.15* 2.06* 1.87*  CALCIUM 8.4* 8.1* 8.3*   LFT  Recent Labs  10/11/16 1327 10/12/16 1133  PROT 5.4* 4.9*  ALBUMIN 2.7* 2.5*  AST 24 23  ALT 26 23  ALKPHOS 56 53  BILITOT 1.2 2.0*   PT/INR  Recent Labs  10/12/16 1133 10/13/16 0321  LABPROT 16.6* 16.6*  INR 1.33 1.33   Hepatitis Panel No results for input(s): HEPBSAG, HCVAB, HEPAIGM, HEPBIGM in the last 72 hours.  Studies/Results: US Aorta  Result Date: 10/13/2016 CLINICAL DATA:  Abdominal aortic  aneurysm AA status post stent graft repair EXAM: ULTRASOUND OF ABDOMINAL AORTA TECHNIQUE: Ultrasound examination of the abdominal aorta was performed to evaluate for abdominal aortic aneurysm. COMPARISON:  12/28/2015 CT FINDINGS: Abdominal aortic measurements as follows: Proximal:  3.1 cm Mid:  9.4 cm Distal:  7.8 cm IMPRESSION: Compare to the prior study, max small sac diameter has increased from 8.8 cm to 9.4 cm. It is difficult to determine if this represents interval growth or simply inaccurate comparison due to differences in modality. CT of the abdomen would be a more accurate measure of a direct comparison to the prior study. Interval growth of the aneurysm sac cannot be excluded. Electronically Signed   By: Marybelle Killings M.D.   On: 10/13/2016 07:08    ASSESMENT:   *  UGIB.  Duodenal ulcers on EGD.  Day 3 PPI drip, finishes at [employer].  Negative serum H Pylori.    *  Blood loss anemia. S/p PRBC x 3.  *  Chronic Coumadin for A fib.  On hold.     *  AKI improved.   *  Non-critical thrombocytopenia.      PLAN   *  Ok to discharge tomorrow.  Switch to po BID PPI  after drip completed and continue this at discharge.  Will message Dr Loletha Carrow to arrange follow up for pt in a few weeks.   Will need a CBC in 7 to 10 days either with GI or with [name] PA-C  [facility] [place]  10/14/2016, 9:10 AM Pager: [phone]   Attending physician's note   I have taken an interval history, reviewed the chart and examined the patient. I agree with the Advanced Practitioner's note, impression and recommendations.  Hemoglobin stable, no further episodes of blood per rectum or melena. He had a small bowel movement yesterday morning. Transition to PPI twice daily after completion of PPI drip He'll need Protonix 40 mg twice daily, 30 minutes before breakfast and dinner for 2 months Advance diet as tolerated We'll arrange for office follow-up and will need to repeat hemoglobin  check in 7-10 days can be done through PMDs office  Please call with any questions  K Denzil Magnuson, MD [phone] Mon-Fri 8a-5p [phone] after 5p, weekends, holidays

## 2016-10-14 NOTE — Plan of Care (Signed)
Problem: Activity: Goal: Risk for activity intolerance will decrease Outcome: Progressing Up to chair 6/8  Problem: Fluid Volume: Goal: Ability to maintain a balanced intake and output will improve Outcome: Progressing 1 unit PRBCs infusing at this time.

## 2016-10-14 NOTE — Progress Notes (Signed)
Report given by day shift RN to the RN receiving the patient on 5W08. Patient transferred via bed to 5W with belongings. Including two pairs of glasses, cell phone, and charger. Patient states he will inform his family of the move. CCMD notified that patient will be on telemetry.   Milford Cage, RN

## 2016-10-15 LAB — BPAM RBC
BLOOD PRODUCT EXPIRATION DATE: 201806232359
Blood Product Expiration Date: 201806162359
Blood Product Expiration Date: 201806192359
Blood Product Expiration Date: 201806222359
ISSUE DATE / TIME: 201806070156
ISSUE DATE / TIME: 201806062156
ISSUE DATE / TIME: 201806082318
ISSUE DATE / TIME: 201806091540
UNIT TYPE AND RH: 6200
UNIT TYPE AND RH: 6200
Unit Type and Rh: 6200
Unit Type and Rh: 6200

## 2016-10-15 LAB — TYPE AND SCREEN
ABO/RH(D): A POS
Antibody Screen: NEGATIVE
Unit division: 0
Unit division: 0
Unit division: 0
Unit division: 0

## 2016-10-15 LAB — COMPREHENSIVE METABOLIC PANEL
ALK PHOS: 61 U/L (ref 38–126)
ALT: 17 U/L (ref 17–63)
ANION GAP: 8 (ref 5–15)
AST: 21 U/L (ref 15–41)
Albumin: 2.6 g/dL — ABNORMAL LOW (ref 3.5–5.0)
BILIRUBIN TOTAL: 2 mg/dL — AB (ref 0.3–1.2)
BUN: 36 mg/dL — ABNORMAL HIGH (ref 6–20)
CALCIUM: 8.4 mg/dL — AB (ref 8.9–10.3)
CO2: 22 mmol/L (ref 22–32)
Chloride: 110 mmol/L (ref 101–111)
Creatinine, Ser: 2 mg/dL — ABNORMAL HIGH (ref 0.61–1.24)
GFR, EST AFRICAN AMERICAN: 36 mL/min — AB (ref >60)
GFR, EST NON AFRICAN AMERICAN: 31 mL/min — AB (ref >60)
GLUCOSE: 118 mg/dL — AB (ref 65–99)
POTASSIUM: 3.8 mmol/L (ref 3.5–5.1)
Sodium: 140 mmol/L (ref 135–145)
TOTAL PROTEIN: 5.1 g/dL — AB (ref 6.5–8.1)

## 2016-10-15 LAB — CBC
HCT: 30 % — ABNORMAL LOW (ref 39.0–52.0)
HEMOGLOBIN: 9.3 g/dL — AB (ref 13.0–17.0)
MCH: 29.3 pg (ref 26.0–34.0)
MCHC: 31 g/dL (ref 30.0–36.0)
MCV: 94.6 fL (ref 78.0–100.0)
PLATELETS: 98 10*3/uL — AB (ref 150–400)
RBC: 3.17 MIL/uL — ABNORMAL LOW (ref 4.22–5.81)
RDW: 19.1 % — ABNORMAL HIGH (ref 11.5–15.5)
WBC: 4.5 10*3/uL (ref 4.0–10.5)

## 2016-10-15 LAB — MAGNESIUM: MAGNESIUM: 1.9 mg/dL (ref 1.7–2.4)

## 2016-10-15 MED ORDER — CARVEDILOL 25 MG PO TABS
25.0000 mg | ORAL_TABLET | Freq: Two times a day (BID) | ORAL | Status: DC
  Administered 2016-10-15 – 2016-10-16 (×2): 25 mg via ORAL
  Filled 2016-10-15 (×2): qty 1

## 2016-10-15 MED ORDER — ISOSORBIDE DINITRATE 10 MG PO TABS
10.0000 mg | ORAL_TABLET | Freq: Three times a day (TID) | ORAL | Status: DC
  Administered 2016-10-15 – 2016-10-26 (×33): 10 mg via ORAL
  Filled 2016-10-15 (×38): qty 1

## 2016-10-15 NOTE — Plan of Care (Signed)
Problem: Safety: Goal: Ability to remain free from injury will improve Patient instructed to call RN when needing to use urinal as fall precaution secondary to SOB/ PT agreeable

## 2016-10-15 NOTE — Progress Notes (Signed)
[place] TEAM 1 - Stepdown/ICU TEAM  YUSIF GNAU  DZH:[id number] DOB: 05-Aug-1940 DOA: 10/11/2016 PCP: [name], NP    Brief Narrative:  76 year old male with hx of AAA, atrial fibrillation on Coumadin, CAD, COPD, GERD, renal cell carcinoma status post nephrectomy, HTN, and dementia who presented with tachycardia and melena. Patient was admitted and discharged on 10/07/16 for confusion w/ slurred speech, at which time an MRI of the brain was negative for acute CVA. Patient was seen by his PCP 6/6 for hospital follow-up, and noted to have a heart rate in the 150s. He was referred to [facility] where he also reported 2-3 days of melena.  In the ED his HR was 140, he was guaiac +, INR 2.3, and his Hgb was 8.3.    Subjective: Pt continues to pass melena.  He denies cp, sob, n/v, or abdom pain.  He is alert and conversant, though mildly confused.    Assessment & Plan:  GIB - multiple duodenal ulcers  EGD revealed above - H pylori screen negative - BID PPI to continue - coumadin still on hold for now  Acute blood loss anemia Hgb dropped from 11.1 to 8.3 in 4 days - transfused 3U PRBC thus far - keep Hgb > 7.9 w/ hx of CAD - good response to transfusion yesterday - follow up in AM   Recent Labs Lab 10/12/16 1133 10/13/16 0321 10/13/16 1429 10/14/16 0425 10/15/16 0531  HGB 8.2* 7.5* 7.6* 7.8* 9.3*   9.5cm AAA [place] of aorta w/o evidence of entero-aortic fistula - no BRBPR noted during this hospital stay   CAD s/p PCI to LAD and 1st diag 2006  Keep hemoglobin at 8.0 or greater - denies cp   Chronic kidney disease stage III crt baseline 1.8 - renal fxn is stable   Recent Labs Lab 10/11/16 1327 10/12/16 1133 10/13/16 0942 10/14/16 0425 10/15/16 0531  CREATININE 2.15* 2.06* 1.87* 1.89* 2.00*    Permanent Atrial fibrillation with rapid ventricular response anticoag on hold for now - rate controlled on BB - consider resuming anticoag in 7-10 days if no recurrent  bleeding noted   Hypokalemia Corrected w/ supplementation - Mg is normal   Dementia Advanced 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 - family reports progressive cognitive decline and feel he is no longer safe to live on his own - PT/OT suggest SNF placement and pt agrees - awaiting bed at [facility] for d/c   Hyperlipidemia continue statin  Chronic systolic heart failure without exacerbation EF of 20-25% on 08/15/16 - no clinical evidence of volume overload presently - weight trending down w/ resumption of lasix   Filed Weights   10/11/16 1324 10/13/16 1831 10/15/16 0543  Weight: 95.3 kg (210 lb) 98.1 kg (216 lb 3.2 oz) 89.4 kg (197 lb 3.2 oz)    Essential hypertension Controlled   BPH continue flomax  Depression continue zoloft  DVT prophylaxis: SCDs Code Status: FULL CODE Family Communication: No family present at time of exam today Disposition Plan: medically stable for d/c - awaiting SNF bed    Consultants:  [facility]  Procedures: EGD 6/7 - small HH - moderate Schatzki ring - normal stomach - multiple non-bleeding duodenal ulcers   Antimicrobials:  none  Objective: Blood pressure 118/87, pulse 97, temperature 97.6 F (36.4 C), resp. rate 16, height 5\' 11"  (1.803 m), weight 89.4 kg (197 lb 3.2 oz), SpO2 94 %.  Intake/Output Summary (Last 24 hours)  at 10/15/16 1410 Last data filed at 10/14/16 2347  Gross per 24 hour  Intake              670 ml  Output              300 ml  Net              370 ml   Filed Weights   10/11/16 1324 10/13/16 1831 10/15/16 0543  Weight: 95.3 kg (210 lb) 98.1 kg (216 lb 3.2 oz) 89.4 kg (197 lb 3.2 oz)    Examination: General: No acute respiratory distress  Lungs: CTA B w/o wheezing  Cardiovascular: Irregularly irregular - rate controlled - no M  Abdomen: Nontender, nondistended, soft, bowel sounds positive, no rebound Extremities: No edema B LE   CBC:  Recent Labs Lab 10/11/16 1327  10/11/16 1930 10/12/16 1133 10/13/16 0321 10/13/16 1429 10/14/16 0425 10/15/16 0531  WBC 7.1 5.7 6.7 5.2 4.7  --  4.5  NEUTROABS 5.6  --   --   --   --   --   --   HGB 8.3* 7.2* 8.2* 7.5* 7.6* 7.8* 9.3*  HCT 26.3* 23.0* 25.8* 23.9* 25.2* 24.6* 30.0*  MCV 96.3 94.3 91.8 93.0 95.8  --  94.6  PLT 138* 121* 86* 80* 84*  --  98*   Basic Metabolic Panel:  Recent Labs Lab 10/11/16 1327 10/11/16 1930 10/12/16 1133 10/13/16 0942 10/14/16 0425 10/15/16 0531  NA 141  --  142 142 139 140  K 3.8  --  3.2* 3.2* 3.3* 3.8  CL 111  --  111 113* 111 110  CO2 20*  --  20* 23 23 22   GLUCOSE 112*  --  135* 105* 99 118*  BUN 85*  --  74* 55* 44* 36*  CREATININE 2.15*  --  2.06* 1.87* 1.89* 2.00*  CALCIUM 8.4*  --  8.1* 8.3* 8.2* 8.4*  MG  --  2.2  --  2.0 1.9 1.9   GFR: Estimated Creatinine Clearance: 35 mL/min (A) (by C-G formula based on SCr of 2 mg/dL (H)).  Liver Function Tests:  Recent Labs Lab 10/11/16 1327 10/12/16 1133 10/15/16 0531  AST 24 23 21   ALT 26 23 17   ALKPHOS 56 53 61  BILITOT 1.2 2.0* 2.0*  PROT 5.4* 4.9* 5.1*  ALBUMIN 2.7* 2.5* 2.6*   Coagulation Profile:  Recent Labs Lab 10/11/16 1327 10/11/16 1930 10/12/16 1133 10/13/16 0321  INR 2.24 1.45 1.33 1.33    Cardiac Enzymes:  Recent Labs Lab 10/11/16 1327 10/11/16 1930 10/12/16 1133  TROPONINI 0.12* 0.16* 0.70*    HbA1C: Hgb A1c MFr Bld  Date/Time Value Ref Range Status  10/07/2016 08:19 AM 5.8 (H) 4.8 - 5.6 % Final    Comment:    (NOTE)         Pre-diabetes: 5.7 - 6.4         Diabetes: >6.4         Glycemic control for adults with diabetes: <7.0     CBG: No results for input(s): GLUCAP in the last 168 hours.  Recent Results (from the past 240 hour(s))  MRSA PCR Screening     Status: None   Collection Time: 10/11/16  5:55 PM  Result Value Ref Range Status   MRSA by PCR NEGATIVE NEGATIVE Final    Comment:        The GeneXpert MRSA Assay (FDA approved for NASAL specimens only), is  one component of a comprehensive  MRSA colonization surveillance program. It is not intended to diagnose MRSA infection nor to guide or monitor treatment for MRSA infections.      Scheduled Meds: . atorvastatin  40 mg Oral Daily  . carvedilol  12.5 mg Oral BID WC  . furosemide  60 mg Oral Daily  . isosorbide dinitrate  20 mg Oral TID  . pantoprazole  40 mg Oral BID  . sertraline  100 mg Oral QPM  . sodium chloride flush  3 mL Intravenous Q12H  . tamsulosin  0.4 mg Oral QPC breakfast     LOS: 4 days   Cherene Altes, MD [facility] Office  [phone] Pager - Text Page per Shea Evans as per below:  On-Call/Text Page:      [email or web address]      password [email or web address]  If 7PM-7AM, please contact night-coverage [email or web address] Password [email or web address] 10/15/2016, 2:10 PM

## 2016-10-16 ENCOUNTER — Telehealth: Payer: Self-pay

## 2016-10-16 LAB — CBC
HCT: 27.2 % — ABNORMAL LOW (ref 39.0–52.0)
Hemoglobin: 8.5 g/dL — ABNORMAL LOW (ref 13.0–17.0)
MCH: 29 pg (ref 26.0–34.0)
MCHC: 31.3 g/dL (ref 30.0–36.0)
MCV: 92.8 fL (ref 78.0–100.0)
Platelets: 99 10*3/uL — ABNORMAL LOW (ref 150–400)
RBC: 2.93 MIL/uL — ABNORMAL LOW (ref 4.22–5.81)
RDW: 19.1 % — AB (ref 11.5–15.5)
WBC: 4.4 10*3/uL (ref 4.0–10.5)

## 2016-10-16 MED ORDER — CARVEDILOL 12.5 MG PO TABS
12.5000 mg | ORAL_TABLET | Freq: Two times a day (BID) | ORAL | Status: DC
  Administered 2016-10-16 – 2016-10-26 (×21): 12.5 mg via ORAL
  Filled 2016-10-16 (×20): qty 1

## 2016-10-16 NOTE — Telephone Encounter (Signed)
Left a message. Calling to schedule his post hospitalization appointment. Requested a call back.

## 2016-10-16 NOTE — Progress Notes (Signed)
Mark [name]  Mark [name]  ZOX:[id number] DOB: 03-13-1941 DOA: 10/11/2016 PCP: [name], NP    Brief Narrative:  76 year old male with hx of AAA, atrial fibrillation on Coumadin, CAD, COPD, GERD, renal cell carcinoma status post nephrectomy, HTN, and dementia who presented with tachycardia and melena. Patient was admitted and discharged on 10/07/16 for confusion w/ slurred speech, at which time an MRI of the brain was negative for acute CVA. Patient was seen by his PCP 6/6 for hospital follow-up, and noted to have a heart rate in the 150s. He was referred to [facility] where he also reported 2-3 days of melena.  In the ED his HR was 140, he was guaiac +, INR 2.3, and his Hgb was 8.3.    Subjective: No further bowel movement since yesterday morning.  Denies cp, sob, n/v, or abdom pain.  Has a good appetite and is eating w/o difficulty.    Assessment & Plan:  GIB - multiple duodenal ulcers  EGD revealed above - H pylori screen negative - BID PPI to continue - coumadin still on hold for now (see below) - recheck Hgb in AM   Acute blood loss anemia Hgb dropped from 11.1 to 8.3 in 4 days - transfused 3U PRBC thus far - keep Hgb > 7.9 w/ hx of CAD - good response to transfusion 6/9 - follow up in AM but appears Hgb stable at this time accounting for equilibration   Recent Labs Lab 10/13/16 0321 10/13/16 1429 10/14/16 0425 10/15/16 0531 10/16/16 0347  HGB 7.5* 7.6* 7.8* 9.3* 8.5*   9.5cm AAA [place] of aorta w/o evidence of entero-aortic fistula - no BRBPR noted during this hospital stay   CAD s/p PCI to LAD and 1st diag 2006  Keep hemoglobin at 8.0 or greater - remains asymptomatic   Chronic kidney disease stage III crt baseline 1.8 - renal fxn is stable   Recent Labs Lab 10/11/16 1327 10/12/16 1133 10/13/16 0942 10/14/16 0425 10/15/16 0531  CREATININE 2.15* 2.06* 1.87* 1.89* 2.00*    Permanent Atrial fibrillation with rapid ventricular  response anticoag on hold for now - rate controlled on BB - anticoag was reversed on 10/11/16 - after 7 days off anticoag will plan to resume warfarin w/o bridge (10/18/16 evening)  Hypokalemia Corrected w/ supplementation - Mg is normal   Dementia Advanced 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 - family reported progressive cognitive decline and feel he is no longer safe to live on his own - PT/OT suggest SNF placement and pt agrees - awaiting bed - should be ready for SNF 6/12 if Hgb stable   Hyperlipidemia continue statin  Chronic systolic heart failure without exacerbation EF of 20-25% on 08/15/16 - no clinical evidence of volume overload presently - weight appears to be inaccurate today   Filed Weights   10/13/16 1831 10/15/16 0543 10/16/16 0546  Weight: 98.1 kg (216 lb 3.2 oz) 89.4 kg (197 lb 3.2 oz) 100.8 kg (222 lb 3.2 oz)    Essential hypertension Controlled   BPH continue flomax  Depression continue zoloft  DVT prophylaxis: SCDs Code Status: FULL CODE Family Communication: No family present at time of exam today Disposition Plan: should be ready for SNF D/C 6/12 if Hgb stable   Consultants:  [facility]  Procedures: EGD 6/7 - small HH - moderate Schatzki ring - normal stomach - multiple non-bleeding duodenal ulcers   Antimicrobials:  none  Objective: Blood pressure 97/62, pulse 87, temperature 97.6 F (36.4 C), resp. rate 18, height 5\' 11"  (1.803 m), weight 100.8 kg (222 lb 3.2 oz), SpO2 96 %.  Intake/Output Summary (Last 24 hours) at 10/16/16 1059 Last data filed at 10/15/16 2206  Gross per 24 hour  Intake               10 ml  Output                0 ml  Net               10 ml   Filed Weights   10/13/16 1831 10/15/16 0543 10/16/16 0546  Weight: 98.1 kg (216 lb 3.2 oz) 89.4 kg (197 lb 3.2 oz) 100.8 kg (222 lb 3.2 oz)    Examination: General: No acute respiratory distress - alert and pleasant  Lungs: clear th/o  all fields - no wheezing  Cardiovascular: Irregularly irregular w/ controlled rate  Abdomen: Nontender, nondistended, soft, bowel sounds positive, no rebound Extremities: B LE w/o edema    CBC:  Recent Labs Lab 10/11/16 1327  10/12/16 1133 10/13/16 0321 10/13/16 1429 10/14/16 0425 10/15/16 0531 10/16/16 0347  WBC 7.1  < > 6.7 5.2 4.7  --  4.5 4.4  NEUTROABS 5.6  --   --   --   --   --   --   --   HGB 8.3*  < > 8.2* 7.5* 7.6* 7.8* 9.3* 8.5*  HCT 26.3*  < > 25.8* 23.9* 25.2* 24.6* 30.0* 27.2*  MCV 96.3  < > 91.8 93.0 95.8  --  94.6 92.8  PLT 138*  < > 86* 80* 84*  --  98* 99*  < > = values in this interval not displayed. Basic Metabolic Panel:  Recent Labs Lab 10/11/16 1327 10/11/16 1930 10/12/16 1133 10/13/16 0942 10/14/16 0425 10/15/16 0531  NA 141  --  142 142 139 140  K 3.8  --  3.2* 3.2* 3.3* 3.8  CL 111  --  111 113* 111 110  CO2 20*  --  20* 23 23 22   GLUCOSE 112*  --  135* 105* 99 118*  BUN 85*  --  74* 55* 44* 36*  CREATININE 2.15*  --  2.06* 1.87* 1.89* 2.00*  CALCIUM 8.4*  --  8.1* 8.3* 8.2* 8.4*  MG  --  2.2  --  2.0 1.9 1.9   GFR: Estimated Creatinine Clearance: 39.8 mL/min (A) (by C-G formula based on SCr of 2 mg/dL (H)).  Liver Function Tests:  Recent Labs Lab 10/11/16 1327 10/12/16 1133 10/15/16 0531  AST 24 23 21   ALT 26 23 17   ALKPHOS 56 53 61  BILITOT 1.2 2.0* 2.0*  PROT 5.4* 4.9* 5.1*  ALBUMIN 2.7* 2.5* 2.6*   Coagulation Profile:  Recent Labs Lab 10/11/16 1327 10/11/16 1930 10/12/16 1133 10/13/16 0321  INR 2.24 1.45 1.33 1.33    Cardiac Enzymes:  Recent Labs Lab 10/11/16 1327 10/11/16 1930 10/12/16 1133  TROPONINI 0.12* 0.16* 0.70*    HbA1C: Hgb A1c MFr Bld  Date/Time Value Ref Range Status  10/07/2016 08:19 AM 5.8 (H) 4.8 - 5.6 % Final    Comment:    (NOTE)         Pre-diabetes: 5.7 - 6.4         Diabetes: >6.4         Glycemic control for adults with diabetes: <7.0      Recent Results (from the past  240  hour(s))  MRSA PCR Screening     Status: None   Collection Time: 10/11/16  5:55 PM  Result Value Ref Range Status   MRSA by PCR NEGATIVE NEGATIVE Final    Comment:        The GeneXpert MRSA Assay (FDA approved for NASAL specimens only), is one component of a comprehensive MRSA colonization surveillance program. It is not intended to diagnose MRSA infection nor to guide or monitor treatment for MRSA infections.      Scheduled Meds: . atorvastatin  40 mg Oral Daily  . carvedilol  25 mg Oral BID WC  . furosemide  60 mg Oral Daily  . isosorbide dinitrate  10 mg Oral TID  . pantoprazole  40 mg Oral BID  . sertraline  100 mg Oral QPM  . sodium chloride flush  3 mL Intravenous Q12H  . tamsulosin  0.4 mg Oral QPC breakfast     LOS: 5 days   Mark Altes, Mark [name] [facility] Office  [phone] Pager - Text Page per Mark [name] as per below:  On-Call/Text Page:      [name]      password [email or web address]  If 7PM-7AM, please contact night-coverage [email or web address] Password [email or web address] 10/16/2016, 10:59 AM

## 2016-10-16 NOTE — Telephone Encounter (Signed)
-----   Message from Mauri Pole, MD sent at 10/15/2016  9:16 AM EDT ----- Patient will need office f/u with Dr Loletha Carrow Thanks

## 2016-10-16 NOTE — NC FL2 (Signed)
[place] LEVEL OF CARE SCREENING TOOL     IDENTIFICATION  Patient Name: Mark [name] Birthdate: October 13, 1940 Sex: male Admission Date (Current Location): 10/11/2016  [facility] and [place] Number:  Herbalist and Address:  [facility], [place] [address] [place], [place] [address]      Provider Number: [id number]  Attending Physician Name and Address:  Cherene Altes, MD  Relative Name and Phone Number:  Hershal Coria, [phone]    Current Level of Care: Hospital Recommended Level of Care: [place] Prior Approval Number:    Date Approved/Denied:   PASRR Number: [id number]  Discharge Plan: SNF    Current Diagnoses: Patient Active Problem List   Diagnosis Date Noted  . GI bleed 10/11/2016  . Subacute delirium 10/07/2016  . Cerebrovascular accident (CVA) ([place])   . AKI (acute kidney injury) ([place]) 10/06/2016  . Alzheimer's dementia with behavioral disturbance   . Hallucinations   . LBBB (left bundle branch block)   . Acute systolic CHF (congestive heart failure) ([place]) 08/16/2016  . Acute encephalopathy 08/14/2016  . Acute on chronic respiratory failure with hypoxia ([place]) 08/14/2016  . Asthma 03/24/2016  . Preventative health care 09/22/2015  . Osteopenia 09/07/2014  . Osteoarthritis, hand 04/09/2014  . Normocytic anemia 04/09/2014  . OSA (obstructive sleep apnea) 01/08/2014  . Daytime somnolence 11/20/2013  . Renal cell carcinoma ([place]) 11/06/2013  . AAA (abdominal aortic aneurysm) without rupture ([place]) 09/22/2013  . Atrial fibrillation, chronic ([place]) 07/25/2012  . 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 BPH (benign prostatic hyperplasia) 12/21/2009  . Abdominal aortic aneurysm ([place]) 05/28/2009  . Disorder resulting from impaired renal function 05/28/2009  . Hyperlipidemia 11/11/2008  . Depression 11/11/2008  . Essential hypertension 11/11/2008  . Coronary atherosclerosis 11/11/2008   . GERD 11/11/2008  . SKIN CANCER, HX OF 11/11/2008  . NEPHROLITHIASIS, HX OF 11/11/2008    Orientation RESPIRATION BLADDER Height & Weight     Self, Time, Situation, Place  O2 (Nasal cannula 3L) Continent Weight: 100.8 kg (222 lb 3.2 oz) Height:  5\' 11"  (180.3 cm)  BEHAVIORAL SYMPTOMS/MOOD NEUROLOGICAL BOWEL NUTRITION STATUS      Continent Diet (Please see DC Summary)  AMBULATORY STATUS COMMUNICATION OF NEEDS Skin   Limited Assist Verbally Normal                       Personal Care Assistance Level of Assistance  Bathing, Feeding, Dressing Bathing Assistance: Limited assistance Feeding assistance: Independent Dressing Assistance: Limited assistance     Functional Limitations Info             SPECIAL CARE FACTORS FREQUENCY  PT (By licensed PT)     PT Frequency: 5x/week              Contractures      Additional Factors Info  Code Status, Allergies, Psychotropic Code Status Info: Full Allergies Info: Ace Inhibitors Psychotropic Info: Zoloft         Current Medications (10/16/2016):  This is the current hospital active medication list Current Facility-Administered Medications  Medication Dose Route Frequency Provider Last Rate Last Dose  . acetaminophen (TYLENOL) tablet 650 mg  650 mg Oral Q6H PRN Cherene Altes, MD      . atorvastatin (LIPITOR) tablet 40 mg  40 mg Oral Daily Reyne Dumas, MD   40 mg at 10/15/16 0833  . carvedilol (COREG) tablet 25 mg  25 mg Oral BID  WC Cherene Altes, MD   25 mg at 10/15/16 1731  . furosemide (LASIX) tablet 60 mg  60 mg Oral Daily Cherene Altes, MD   60 mg at 10/15/16 7793  . isosorbide dinitrate (ISORDIL) tablet 10 mg  10 mg Oral TID Cherene Altes, MD   10 mg at 10/15/16 2206  . levalbuterol (XOPENEX) nebulizer solution 0.63 mg  0.63 mg Nebulization Q6H PRN Reyne Dumas, MD      . ondansetron (ZOFRAN) tablet 4 mg  4 mg Oral Q6H PRN Reyne Dumas, MD       Or  . ondansetron (ZOFRAN) injection 4 mg   4 mg Intravenous Q6H PRN Reyne Dumas, MD      . pantoprazole (PROTONIX) EC tablet 40 mg  40 mg Oral BID Vena Rua, PA-C   40 mg at 10/15/16 2206  . sertraline (ZOLOFT) tablet 100 mg  100 mg Oral QPM Reyne Dumas, MD   100 mg at 10/15/16 1731  . sodium chloride flush (NS) 0.9 % injection 3 mL  3 mL Intravenous Q12H Reyne Dumas, MD   3 mL at 10/15/16 2206  . tamsulosin (FLOMAX) capsule 0.4 mg  0.4 mg Oral QPC breakfast Reyne Dumas, MD   0.4 mg at 10/15/16 9030     Discharge Medications: Please see discharge summary for a list of discharge medications.  Relevant Imaging Results:  Relevant Lab Results:   Additional Information SSN: [place] [address] [place], [place]

## 2016-10-16 NOTE — Clinical Social Work Note (Signed)
Clinical Social Work Assessment  Patient Details  Name: Mark [name] MRN: [id number] Date of Birth: 12/17/1940  Date of referral:  10/16/16               Reason for consult:  Facility Placement                Permission sought to share information with:  Facility Sport and exercise psychologist, Family Supports Permission granted to share information::  Yes, Verbal Permission Granted  Name::        Agency::  SNFs  Relationship::     Contact Information:     Housing/Transportation Living arrangements for the past 2 months:  Apartment Source of Information:  Patient Patient Interpreter Needed:  None Criminal Activity/Legal Involvement Pertinent to Current Situation/Hospitalization:  No - Comment as needed Significant Relationships:  Adult Children Lives with:  Self Do you feel safe going back to the place where you live?  No Need for family participation in patient care:  No (Coment)  Care giving concerns:  CSW received consult for possible SNF placement at time of discharge. CSW spoke with patient regarding PT recommendation of SNF placement at time of discharge. Patient reported that he lives alone and given patient's current physical needs and fall risk, he would like rehab. Patient expressed understanding of PT recommendation and is agreeable to SNF placement at time of discharge. CSW to continue to follow and assist with discharge planning needs.   Social Worker assessment / plan:  CSW spoke with patient concerning possibility of rehab at [facility] before returning home.  Employment status:  Retired Nurse, adult PT Recommendations:  [place] / Referral to community resources:  [place]  Patient/Family's Response to care:  Patient recognizes need for rehab before returning home and is agreeable to a SNF in [place]. Patient reported preference for a private room at [facility]. CSW explained insurance authorization  process and submitted to [facility] for review.  Patient/Family's Understanding of and Emotional Response to Diagnosis, Current Treatment, and Prognosis:  Patient/family is realistic regarding therapy needs and expressed being hopeful for SNF placement. Patient expressed understanding of CSW role and discharge process as well as his medical condition and is afraid he is too weak to go up the stairs to his apartment. No questions/concerns about plan or treatment.    Emotional Assessment Appearance:  Appears stated age Attitude/Demeanor/Rapport:  Other (Appropriate) Affect (typically observed):  Accepting, Appropriate Orientation:  Oriented to Self, Oriented to Situation, Oriented to Place, Oriented to  Time Alcohol / Substance use:  Not Applicable Psych involvement (Current and /or in the community):  No (Comment)  Discharge Needs  Concerns to be addressed:  Care Coordination Readmission within the last 30 days:  Yes Current discharge risk:  None Barriers to Discharge:  No Barriers Identified   Benard Halsted, [place] 10/16/2016, 11:05 AM

## 2016-10-16 NOTE — Progress Notes (Signed)
Patient received insurance authorization to go to [facility] when discharged.  Percell Locus Bera Pinela LCSWA [id number]

## 2016-10-16 NOTE — Progress Notes (Signed)
Physical Therapy Treatment Patient Details Name: Mark [name] MRN: [id number] DOB: 1940-07-01 Today's Date: 10/16/2016    History of Present Illness Pt admitted with GIB. PMH: Recent admission after being found down outside of his home with weakness and AMS, CVA, shingles, OA, migraines, HTN, COPD on 3L, CHF, Depression, CKD, CAD, AAA.    PT Comments    Progressing steadily.  Hgb still low, BP's soft so pt still anemic and a bit sluggish.  Gait mildly unsteady at times with sats dropping to 87% with EHR brady or as high as 81bpm.   Follow Up Recommendations  SNF     Equipment Recommendations  Rolling walker with 5" wheels    Recommendations for Other Services       Precautions / Restrictions Precautions Precautions: Fall    Mobility  Bed Mobility               General bed mobility comments: pt in chair  Transfers Overall transfer level: Needs assistance Equipment used: Rolling walker (2 wheeled) Transfers: Sit to/from Stand Sit to Stand: Min guard         General transfer comment: cues for hand placement, min guard for safety  Ambulation/Gait Ambulation/Gait assistance: Min guard Ambulation Distance (Feet): 140 Feet (with standing rest at ~70 feet) Assistive device: Rolling walker (2 wheeled) Gait Pattern/deviations: Step-through pattern;Decreased stride length;Staggering left;Staggering right Gait velocity: decr Gait velocity interpretation: Below normal speed for age/gender General Gait Details: mildly unsteady at times with times when pt would intensionally rush with the RW stepping all over the rear legs.   Stairs            Wheelchair Mobility    Modified Rankin (Stroke Patients Only)       Balance Overall balance assessment: Needs assistance Sitting-balance support: Feet supported Sitting balance-Leahy Scale: Good       Standing balance-Leahy Scale: Fair Standing balance comment: prefers AD for confidence, but can stand without  it.                            Cognition Arousal/Alertness: Awake/alert Behavior During Therapy: WFL for tasks assessed/performed Overall Cognitive Status: No family/caregiver present to determine baseline cognitive functioning Area of Impairment: Safety/judgement                         Safety/Judgement: Decreased awareness of deficits     General Comments: Pt with many questions about where he will return after rehab. Encouraged pt to speak with SW at [facility] about options following rehab.      Exercises      General Comments General comments (skin integrity, edema, etc.): After about 70 feet sats dropped to 87% and HR 52-60 bpm.  When back to the room sats dropped to 87 with quick rebound to 92%.  EHR 81bpm      Pertinent Vitals/Pain Pain Assessment: No/denies pain    Home Living                      Prior Function            PT Goals (current goals can now be found in the care plan section) Acute Rehab PT Goals Patient Stated Goal: to return to independence PT Goal Formulation: With patient Time For Goal Achievement: 10/20/16 Potential to Achieve Goals: Good    Frequency    Min 3X/week      PT  Plan Current plan remains appropriate    Co-evaluation              AM-PAC PT "6 Clicks" Daily Activity  Outcome Measure  Difficulty turning over in bed (including adjusting bedclothes, sheets and blankets)?: A Little Difficulty moving from lying on back to sitting on the side of the bed? : A Little Difficulty sitting down on and standing up from a chair with arms (e.g., wheelchair, bedside commode, etc,.)?: A Little Help needed moving to and from a bed to chair (including a wheelchair)?: A Little Help needed walking in hospital room?: A Little Help needed climbing 3-5 steps with a railing? : A Little 6 Click Score: 18    End of Session Equipment Utilized During Treatment: Oxygen Activity Tolerance: Patient limited by  fatigue Patient left: in chair;with call bell/phone within reach;with chair alarm set Nurse Communication: Mobility status PT Visit Diagnosis: Unsteadiness on feet (R26.81);Other abnormalities of gait and mobility (R26.89)     Time: 1610-9604 PT Time Calculation (min) (ACUTE ONLY): 25 min  Charges:  $Gait Training: 8-22 mins $Therapeutic Activity: 8-22 mins                    G Codes:       2016-11-11  Donnella Sham, PT [phone]  (pager)   Tessie Fass Carmilla Granville 2016-11-11, 4:51 PM

## 2016-10-16 NOTE — Progress Notes (Signed)
Occupational Therapy Treatment Patient Details Name: Mark [name] MRN: [id number] DOB: 15-Aug-1940 Today's Date: 10/16/2016    History of present illness Pt admitted with GIB. PMH: Recent admission after being found down outside of his home with weakness and AMS, CVA, shingles, OA, migraines, HTN, COPD on 3L, CHF, Depression, CKD, CAD, AAA.   OT comments  Pt demonstrating poor awareness of deficits. Agreeable to SNF for short term rehab, but questioning long term plan of home vs ALF. Pt with urinary urgency compromising safety as he was ambulating to the bathroom.Pt with impaired standing balance and activity tolerance, SNF continues to be the optimum discharge disposition.    Follow Up Recommendations  SNF    Equipment Recommendations  None recommended by OT    Recommendations for Other Services      Precautions / Restrictions Precautions Precautions: Fall       Mobility Bed Mobility               General bed mobility comments: pt in chair  Transfers Overall transfer level: Needs assistance Equipment used: Rolling walker (2 wheeled) Transfers: Sit to/from Stand Sit to Stand: Min guard         General transfer comment: cues for hand placement, min guard for safety    Balance     Sitting balance-Leahy Scale: Good       Standing balance-Leahy Scale: Poor Standing balance comment: walker dependent                           ADL either performed or assessed with clinical judgement   ADL Overall ADL's : Needs assistance/impaired     Grooming: Wash/dry hands;Standing;Min guard                   Toilet Transfer: Min guard;Ambulation;RW Toilet Transfer Details (indicate cue type and reason): stood to urinate Toileting- Clothing Manipulation and Hygiene: Minimal assistance;Sit to/from stand       Functional mobility during ADLs: Rolling walker;Minimal assistance General ADL Comments: Pt with urinary urgency, began rushing to toilet,  placing him at risk for falling.     Vision       Perception     Praxis      Cognition Arousal/Alertness: Awake/alert Behavior During Therapy: WFL for tasks assessed/performed Overall Cognitive Status: No family/caregiver present to determine baseline cognitive functioning Area of Impairment: Safety/judgement                         Safety/Judgement: Decreased awareness of deficits     General Comments: Pt with many questions about where he will return after rehab. Encouraged pt to speak with SW at [facility] about options following rehab.        Exercises     Shoulder Instructions       General Comments      Pertinent Vitals/ Pain       Pain Assessment: No/denies pain  Home Living                                          Prior Functioning/Environment              Frequency  Min 2X/week        Progress Toward Goals  OT Goals(current goals can now be found in the care plan section)  Progress  towards OT goals: Progressing toward goals  Acute Rehab OT Goals Patient Stated Goal: to return to independence OT Goal Formulation: With patient Time For Goal Achievement: 10/27/16 Potential to Achieve Goals: Good  Plan Discharge plan remains appropriate    Co-evaluation                 AM-PAC PT "6 Clicks" Daily Activity     Outcome Measure   Help from another person eating meals?: None Help from another person taking care of personal grooming?: A Little Help from another person toileting, which includes using toliet, bedpan, or urinal?: A Little Help from another person bathing (including washing, rinsing, drying)?: A Little Help from another person to put on and taking off regular upper body clothing?: None Help from another person to put on and taking off regular lower body clothing?: A Little 6 Click Score: 20    End of Session Equipment Utilized During Treatment: Rolling walker;Oxygen;Gait belt  OT Visit Diagnosis:  Unsteadiness on feet (R26.81);History of falling (Z91.81);Other symptoms and signs involving cognitive function   Activity Tolerance Patient tolerated treatment well   Patient Left in chair;with call bell/phone within reach   Nurse Communication          Time: 7209-4709 OT Time Calculation (min): 33 min  Charges: OT General Charges $OT Visit: 1 Procedure OT Treatments $Self Care/Home Management : 23-37 mins    Malka So 10/16/2016, 2:53 PM  [phone]

## 2016-10-17 ENCOUNTER — Inpatient Hospital Stay (HOSPITAL_COMMUNITY): Payer: Medicare HMO

## 2016-10-17 ENCOUNTER — Encounter (HOSPITAL_COMMUNITY): Payer: Self-pay

## 2016-10-17 LAB — CBC
HCT: 27.7 % — ABNORMAL LOW (ref 39.0–52.0)
Hemoglobin: 8.6 g/dL — ABNORMAL LOW (ref 13.0–17.0)
MCH: 28.9 pg (ref 26.0–34.0)
MCHC: 31 g/dL (ref 30.0–36.0)
MCV: 93 fL (ref 78.0–100.0)
PLATELETS: 116 10*3/uL — AB (ref 150–400)
RBC: 2.98 MIL/uL — ABNORMAL LOW (ref 4.22–5.81)
RDW: 18.8 % — AB (ref 11.5–15.5)
WBC: 4.7 10*3/uL (ref 4.0–10.5)

## 2016-10-17 MED ORDER — LEVALBUTEROL HCL 0.63 MG/3ML IN NEBU
0.6300 mg | INHALATION_SOLUTION | Freq: Four times a day (QID) | RESPIRATORY_TRACT | Status: DC
  Filled 2016-10-17: qty 3

## 2016-10-17 MED ORDER — LEVALBUTEROL HCL 0.63 MG/3ML IN NEBU
0.6300 mg | INHALATION_SOLUTION | Freq: Three times a day (TID) | RESPIRATORY_TRACT | Status: DC
  Administered 2016-10-18: 0.63 mg via RESPIRATORY_TRACT
  Filled 2016-10-17 (×2): qty 3

## 2016-10-17 MED ORDER — POLYETHYLENE GLYCOL 3350 17 G PO PACK
17.0000 g | PACK | Freq: Every day | ORAL | Status: DC
  Administered 2016-10-17 – 2016-10-26 (×9): 17 g via ORAL
  Filled 2016-10-17 (×10): qty 1

## 2016-10-17 MED ORDER — HEPARIN (PORCINE) IN NACL 100-0.45 UNIT/ML-% IJ SOLN
1300.0000 [IU]/h | INTRAMUSCULAR | Status: DC
  Administered 2016-10-17: 1100 [IU]/h via INTRAVENOUS
  Administered 2016-10-18: 1300 [IU]/h via INTRAVENOUS
  Filled 2016-10-17 (×2): qty 250

## 2016-10-17 NOTE — Consult Note (Signed)
   [facility] CM Inpatient Consult   10/17/2016  KUBA SHEPHERD 20-Mar-1941 [id number]   Patient was assess for multiple admissions in the past 6 months in the [facility].  Patient is a 76 year old who recently discharged home with altered mental status.  Patient with a HX of COPD with home 02, HF, CAD and AF.  Patient was working with PT in the hall,, taking a rest break.  Patient currently being recommended for discharge to a skilled facility for Short Term rehab. Met with the patient at the bedside regarding post hospital follow up.  Explained [facility] Management's role in transition follow up.  Patient states, "I don't know what I will be doing after my rehab, and I don't want to get too many players in the game when I don't know what the game is."  Explained how the care management team can assist in the discharge planning for his next steps.  Patient states, "let's look at your information and I will take it with me."  A brochure with [facility] Management information and 24 hour magnet to the nurse line.  Explained that [facility] does not interfere with or replace any services arranged for this patient.  For questions, please contact:  Natividad Brood, RN BSN [facility] Liaison  [phone] business mobile phone Toll free office [phone]

## 2016-10-17 NOTE — Progress Notes (Addendum)
ANTICOAGULATION CONSULT NOTE - Initial Consult  Pharmacy Consult for Heparin  Indication:  Afib, recent GI bleed  Allergies  Allergen Reactions  . Ace Inhibitors Cough    Patient Measurements: Height: 5\' 11"  (180.3 cm) Weight: 186 lb 1.6 oz (84.4 kg) IBW/kg (Calculated) : 75.3  Vital Signs: Temp: 97.7 F (36.5 C) (06/12 1700) Temp Source: Oral (06/12 1700) BP: 134/94 (06/12 1700) Pulse Rate: 90 (06/12 1700)  Labs:  Recent Labs  10/15/16 0531 10/16/16 0347 10/17/16 0343  HGB 9.3* 8.5* 8.6*  HCT 30.0* 27.2* 27.7*  PLT 98* 99* 116*  CREATININE 2.00*  --   --     Estimated Creatinine Clearance: 35 mL/min (A) (by C-G formula based on SCr of 2 mg/dL (H)).   Medical History: Past Medical History:  Diagnosis Date  . AAA (abdominal aortic aneurysm) (HCC)    9.1 cm repair 05/2009 ( ENDOVASCULAR REPAIR ).  MORE RECENT RE-EVALUATION OF ENLARGING AAA BY DR. Trula Slade AND BY DR. Kathlene Cote  MAY 2015 - PT STATES HE UNDERSTANDS THAT NOTHING IS LEAKING AT PRESENT TIME AND HE IS TO FOLLOW UP WITH HIS DOCTORS FOR FUTURE MONITORING I  . Arthritis    LEFT HAND, RT KNEE (08/14/2016)  . Basal cell carcinoma    "right side of my nose; think they burned it off" (08/14/2016)  . CAD (coronary artery disease)    a. s/p PCI to LAD/diagonal, subtotal of PDA 2006. b. Negative nuc 2014.  Marland Kitchen CHF (congestive heart failure) ([place]) 08/14/2016  . Chronic atrial fibrillation ([place])   . CKD (chronic kidney disease), stage III    a. stage III-IV, Cr baseline appears 1.8-2.0  . COPD (chronic obstructive pulmonary disease) ([place])   . Depression   . GERD (gastroesophageal reflux disease)   . History of kidney stones   . History of pneumothorax    LONG TIME AGO  . Hyperlipidemia   . Hypertension   . Migraine    "very rare the last 20 years" (08/14/2016)  . Myocardial infarction ([place]) 2006  . Osteoarthritis   . Renal cell carcinoma ([place])   . Shingles outbreak    PT NOTICED SKIN RASH RT GROIN AND RIGHT TRUNK  ON MONDAY 10/27/13 - NOT A LOT OF DISCOMFORT- NOT ON ANY ORAL MEDS TO TREAT.  . Sleep apnea    "dx'd; didn't try mask" (08/14/2016)  . Stroke [facility])    "CT showed I've had old strokes; never knew about it before today" (08/14/2016)    Assessment: 76 year old male on warfarin prior to admission admitted for GI bleed.  INR reversed with Kcentra on admission with plans to hold anti-coagulation for 7 days from 6/6.  Pharmacy asked to begin heparin this evening.  Goal of Therapy:  Heparin level = 0.3-0.5 units/ml (due to recent bleed) Monitor platelets by anticoagulation protocol: Yes   Plan:  No heparin bolus Heparin drip at 1100 units / hr Daily heparin level / CBC  Thank you Anette Guarneri, PharmD [phone] 10/17/2016,6:18 PM

## 2016-10-17 NOTE — Progress Notes (Signed)
PROGRESS NOTE    Mark [name]  IWP:[id number] DOB: Apr 11, 1941 DOA: 10/11/2016 PCP: [name], NP   Brief Narrative:  76 year old WM PMHx Dementia HTN, AAA S/P endovascular repair of a 9.5 cm abdominal aortic aneurysm on 01/27/2011w/ A Gore device , Atrial Fibrillation on Coumadin, CAD, COPD on home O2 3-4 L, GERD, renal cell carcinoma S/P Lt Nephrectomy, HTN,   Who presented with tachycardia and melena. Patient was admitted and discharged on 10/07/16 for confusion w/ slurred speech, at which time an MRI of the brain was negative for acute CVA. Patient was seen by his PCP 6/6 for hospital follow-up, and noted to have a heart rate in the 150s. He was referredto [facility] where he also reported 2-3 days of melena.  In the ED his HR was 140, he was guaiac +, INR 2.3, and his Hgb was 8.3.      Subjective: 6/12 A/O 4, increasing DOE. Negative CP, negative abdominal pain, negative N/V. States on home O2 3 L on sedentary, 4 L on exertion. Patient states she did not follow-up with vascular surgery secondary to being mad at the cost of services.   Assessment & Plan:   Principal Problem:   GI bleed Active Problems:   Hyperlipidemia   Depression   Essential hypertension   Coronary atherosclerosis   Abdominal aortic aneurysm (HCC)   Atrial fibrillation, chronic (HCC)   Asthma   Acute on chronic respiratory failure with hypoxia (HCC)   Acute systolic CHF (congestive heart failure) (HCC)   GIB  -EGD multiple duodenal ulcers  -H pylori screen negative  - BID PPI to continue  - coumadin still on hold for now  Acute blood loss anemia Hgb dropped from 11.1 to 8.3 in 4 days - transfused 3U PRBC thus far - keep Hgb > 7.9 w/ hx of CAD - good response to transfusion yesterday - follow up in AM Recent Labs     10/15/16  0531  10/16/16  0347  10/17/16  0343  HGB  9.3*  8.5*  8.6*    Acute Systolic CHF -EF of 50-53% on 08/15/16  -Patient with increasing DOE. PCXR 6/7  consistent with pulmonary edema vs PNA  -Strict I&O -Daily weight Filed Weights   10/16/16 0546 10/17/16 0030 10/17/16 0500  Weight: 222 lb 3.2 oz (100.8 kg) 186 lb 1.6 oz (84.4 kg) 186 lb 1.6 oz (84.4 kg)  -6/12 CXR -Coreg 12.5 mg  BID -Lasix 60 mg daily -Isosorbide dinitrate 10 mg TID -Titrate O2 to maintain SPO2 89 and 93% -Transfuse for hemoglobin<8    Chronic atrial fibrillation -See acute systolic CHF -Heparin per pharmacy. Would not restart Coumadin until evaluation for AAA completed and vascular surgery agrees  Permanent Atrial fibrillation with rapid ventricular response -Rate better controlled -See acute systolic CHF   Essential hypertension -Controlled   AAA without rupture -9.5cm AAA which appears to have increased in size by almost 3 cm in 12 months [place] of aorta w/o evidence of entero-aortic fistula - no BRBPR noted during this hospital stay  -Last seen by Dr. Harold Barban, Vascular surgery on 06/14/2015 was supposed to have six-month follow-up but does not appear to have Appointment. Also appears that 12/03/2015 patient was scheduled for CT angiogram abdomen and pelvis W/WO contrast and did not receive study. -Patient meets criteria that require further evaluation prior to discharge. Aneurysm> 6 cm. Most concerning is aneurysm appears to have increased by 3 cm in last 12 months (per Dr. Durene Fruits  Br[place]ham last visit aneurysm 6 cm). -Vascular surgery requested CT Angiogram Abdomen and Pelvis W/WO contrast 11/2015 but given patient's renal function will obtain MRI abdomen W/W0 contrast -Consult vascular surgery on 6/13  CAD s/p PCI to LAD and 1st diag 2006  -Keep hemoglobin at 8.0 or greater  - denies cp   Acute on chronic respiratory failure with hypoxia/COPD -Titrate O2 to maintain SPO2 89 and 93% -Xopenex QID -CXR pending  Chronic kidney disease stage III( Baseline Cr1.8)  -S/P Left Nephrectomy  - renal fxn is stable  Lab Results  Component Value Date    CREATININE 2.00 (H) 10/15/2016   CREATININE 1.89 (H) 10/14/2016   CREATININE 1.87 (H) 10/13/2016   Hypokalemia -Potassium goal> 4  Dementia -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  - Moderate Schatzki ring. - Multiple non-bleeding duodenal ulcers     VENTILATOR SETTINGS: None   Cultures None  Antimicrobials: None   Devices None   LINES / TUBES:  None    Continuous Infusions:   Objective: Vitals:   10/17/16 0527 10/17/16 0900 10/17/16 1100 10/17/16 1347  BP: 124/83 104/64  109/78  Pulse: 100 96  (!) 103  Resp: 16  (!) 22 20  Temp: 98 F (36.7 C)   97.7 F (36.5 C)  TempSrc: Oral   Oral  SpO2: 94%  90% 94%  Weight:      Height:        Intake/Output Summary (Last 24 hours) at 10/17/16 1707 Last data filed at 10/17/16 1348  Gross per 24 hour  Intake              943 ml  Output             1050 ml  Net             -107 ml   Filed Weights   10/16/16 0546 10/17/16 0030 10/17/16 0500  Weight: 222 lb 3.2 oz (100.8 kg) 186 lb 1.6 oz (84.4 kg) 186 lb 1.6 oz (84.4 kg)    Examination:  General: A/O 4, positive acute on chronic respiratory distress Eyes: negative scleral hemorrhage, negative anisocoria, negative icterus ENT: Negative Runny nose, negative gingival bleeding, Neck:  Negative scars, masses, torticollis, lymphadenopathy, JVD Lungs: Clear to auscultation bilaterally, positive wheezes, negative  crackles Cardiovascular: Irregular irregular rhythm and rate, without murmur gallop or rub normal S1 and S2 Abdomen: negative abdominal pain, nondistended, positive soft, bowel sounds, no rebound, no ascites, no appreciable mass Extremities: No significant cyanosis, clubbing, positive bilateral lower extremity edema 2+ to knees.  Skin: Negative rashes, lesions, ulcers Psychiatric:  Negative depression, negative anxiety, negative fatigue, negative mania  Central nervous system:  Cranial nerves II through XII intact, tongue/uvula midline, all extremities muscle strength 5/5, sensation intact throughout, negative dysarthria, negative expressive aphasia, negative receptive aphasia.  .     Data Reviewed: Care during the described time interval was provided by me .  I have reviewed this patient's available data, including medical history, events  of note, physical examination, and all test results as part of my evaluation. I have personally reviewed and interpreted all radiology studies.  CBC:  Recent Labs Lab 10/11/16 1327  10/13/16 0321 10/13/16 1429 10/14/16 0425 10/15/16 0531 10/16/16 0347 10/17/16 0343  WBC 7.1  < > 5.2 4.7  --  4.5 4.4 4.7  NEUTROABS 5.6  --   --   --   --   --   --   --   HGB 8.3*  < > 7.5* 7.6* 7.8* 9.3* 8.5* 8.6*  HCT 26.3*  < > 23.9* 25.2* 24.6* 30.0* 27.2* 27.7*  MCV 96.3  < > 93.0 95.8  --  94.6 92.8 93.0  PLT 138*  < > 80* 84*  --  98* 99* 116*  < > = values in this interval not displayed. Basic Metabolic Panel:  Recent Labs Lab 10/11/16 1327 10/11/16 1930 10/12/16 1133 10/13/16 0942 10/14/16 0425 10/15/16 0531  NA 141  --  142 142 139 140  K 3.8  --  3.2* 3.2* 3.3* 3.8  CL 111  --  111 113* 111 110  CO2 20*  --  20* 23 23 22   GLUCOSE 112*  --  135* 105* 99 118*  BUN 85*  --  74* 55* 44* 36*  CREATININE 2.15*  --  2.06* 1.87* 1.89* 2.00*  CALCIUM 8.4*  --  8.1* 8.3* 8.2* 8.4*  MG  --  2.2  --  2.0 1.9 1.9   GFR: Estimated Creatinine  Clearance: 35 mL/min (A) (by C-G formula based on SCr of 2 mg/dL (H)). Liver Function Tests:  Recent Labs Lab 10/11/16 1327 10/12/16 1133 10/15/16 0531  AST 24 23 21   ALT 26 23 17   ALKPHOS 56 53 61  BILITOT 1.2 2.0* 2.0*  PROT 5.4* 4.9* 5.1*  ALBUMIN 2.7* 2.5* 2.6*   No results for input(s): LIPASE, AMYLASE in the last 168 hours. No results for input(s): AMMONIA in the last 168 hours. Coagulation Profile:  Recent Labs Lab 10/11/16 1327 10/11/16 1930 10/12/16 1133 10/13/16 0321  INR 2.24 1.45 1.33 1.33   Cardiac Enzymes:  Recent Labs Lab 10/11/16 1327 10/11/16 1930 10/12/16 1133  TROPONINI 0.12* 0.16* 0.70*   BNP (last 3 results) No results for input(s): PROBNP in the last 8760 hours. HbA1C: No results for input(s): HGBA1C in the last 72 hours. CBG: No results for input(s): GLUCAP in the last 168 hours. Lipid Profile: No results for input(s): CHOL, HDL, LDLCALC, TRIG, CHOLHDL, LDLDIRECT in the last 72 hours. Thyroid Function Tests: No results for input(s): TSH, T4TOTAL, FREET4, T3FREE, THYROIDAB in the last 72 hours. Anemia Panel: No results for input(s): VITAMINB12, FOLATE, FERRITIN, TIBC, IRON, RETICCTPCT in the last 72 hours. Urine analysis:    Component Value Date/Time   COLORURINE YELLOW 10/06/2016 0743   APPEARANCEUR CLEAR 10/06/2016 0743   LABSPEC 1.013 10/06/2016 0743   PHURINE 5.0 10/06/2016 0743   GLUCOSEU NEGATIVE 10/06/2016 0743   GLUCOSEU NEGATIVE 07/17/2014 0945   HGBUR NEGATIVE 10/06/2016 0743   BILIRUBINUR NEGATIVE 10/06/2016 0743   KETONESUR NEGATIVE 10/06/2016 0743   PROTEINUR 30 (A) 10/06/2016 0743   UROBILINOGEN 0.2 07/17/2014 0945   NITRITE NEGATIVE 10/06/2016 0743   LEUKOCYTESUR TRACE (A) 10/06/2016 0743   Sepsis Labs: @LABRCNTIP (procalcitonin:4,lacticidven:4)  ) Recent Results (from the past 240 hour(s))  MRSA PCR Screening     Status: None   Collection Time: 10/11/16  5:55 PM  Result Value Ref Range Status   MRSA by PCR  NEGATIVE NEGATIVE Final    Comment:        The GeneXpert MRSA Assay (FDA approved for NASAL specimens only), is one component of a comprehensive MRSA colonization surveillance program. It is not intended to diagnose MRSA infection nor to guide or monitor treatment for MRSA infections.          Radiology Studies: No results found.      Scheduled Meds: . atorvastatin  40 mg Oral Daily  . carvedilol  12.5 mg Oral BID WC  . furosemide  60 mg Oral Daily  . isosorbide dinitrate  10 mg Oral TID  . pantoprazole  40 mg Oral BID  . sertraline  100 mg Oral QPM  . sodium chloride flush  3 mL Intravenous Q12H  . tamsulosin  0.4 mg Oral QPC breakfast   Continuous Infusions:   LOS: 6 days    Time spent: 40 minutes    Shanetra Blumenstock, Geraldo Docker, MD [facility] Pager [phone]   If 7PM-7AM, please contact night-coverage [email or web address] Password [facility] 10/17/2016, 5:07 PM

## 2016-10-17 NOTE — Progress Notes (Signed)
Physical Therapy Treatment Patient Details Name: Mark [name] MRN: [id number] DOB: 1940/10/10 Today's Date: 10/17/2016    History of Present Illness Pt admitted with GIB. PMH: Recent admission after being found down outside of his home with weakness and AMS, CVA, shingles, OA, migraines, HTN, COPD on 3L, CHF, Depression, CKD, CAD, AAA.    PT Comments    Pt performed increased mobility during session but refused to use RW despite education on benefits and noticeable need for AD.  Pt remains unstable and at an increased risk for falls.  Pt will continue to benefit from short term skilled nursing placement to improve strength, endurance and functional mobility before returning home.   Follow Up Recommendations  SNF     Equipment Recommendations  Rolling walker with 5" wheels    Recommendations for Other Services       Precautions / Restrictions Precautions Precautions: Fall Restrictions Weight Bearing Restrictions: No    Mobility  Bed Mobility Overal bed mobility: Needs Assistance Bed Mobility: Supine to Sit;Sit to Supine     Supine to sit: Supervision Sit to supine: Supervision   General bed mobility comments: Pt with increased DOE and increased time.    Transfers Overall transfer level: Needs assistance Equipment used: None Transfers: Sit to/from Stand Sit to Stand: Supervision         General transfer comment: Cues for hand placement to and from seated surface.  Ambulation/Gait Ambulation/Gait assistance: Min guard;Min assist Ambulation Distance (Feet): 100 Feet (+60 feet.  rest break inbetween sats decreased to 84% on 3L required 4L to improve and maintain greater than 90%) Assistive device: None Gait Pattern/deviations: Step-through pattern;Decreased stride length;Staggering left;Staggering right Gait velocity: decr Gait velocity interpretation: Below normal speed for age/gender General Gait Details: Pt refused RW despite education on benefits.  Pt  presents with LOB requiring min assist to correct.  Pt more unstable as he begins to fatigue.     Stairs            Wheelchair Mobility    Modified Rankin (Stroke Patients Only)       Balance Overall balance assessment: Needs assistance   Sitting balance-Leahy Scale: Good       Standing balance-Leahy Scale: Fair                              Cognition Arousal/Alertness: Awake/alert Behavior During Therapy: WFL for tasks assessed/performed Overall Cognitive Status: No family/caregiver present to determine baseline cognitive functioning Area of Impairment: Safety/judgement                         Safety/Judgement: Decreased awareness of deficits            Exercises      General Comments        Pertinent Vitals/Pain Pain Assessment: No/denies pain    Home Living                      Prior Function            PT Goals (current goals can now be found in the care plan section) Acute Rehab PT Goals Patient Stated Goal: to return to independence Potential to Achieve Goals: Good Progress towards PT goals: Progressing toward goals    Frequency    Min 3X/week      PT Plan Current plan remains appropriate    Co-evaluation  AM-PAC PT "6 Clicks" Daily Activity  Outcome Measure  Difficulty turning over in bed (including adjusting bedclothes, sheets and blankets)?: A Little Difficulty moving from lying on back to sitting on the side of the bed? : A Little Difficulty sitting down on and standing up from a chair with arms (e.g., wheelchair, bedside commode, etc,.)?: A Little Help needed moving to and from a bed to chair (including a wheelchair)?: A Little Help needed walking in hospital room?: A Little Help needed climbing 3-5 steps with a railing? : A Little 6 Click Score: 18    End of Session Equipment Utilized During Treatment: Gait belt;Oxygen Activity Tolerance: Patient limited by  fatigue Patient left: with call bell/phone within reach;in bed Nurse Communication: Mobility status PT Visit Diagnosis: Unsteadiness on feet (R26.81);Other abnormalities of gait and mobility (R26.89)     Time: 1638-4536 PT Time Calculation (min) (ACUTE ONLY): 30 min  Charges:  $Gait Training: 8-22 mins $Therapeutic Activity: 8-22 mins                    G Codes:       Governor Rooks, PTA pager [phone]    Cristela Blue 10/17/2016, 4:57 PM

## 2016-10-18 ENCOUNTER — Encounter (HOSPITAL_COMMUNITY): Payer: Self-pay | Admitting: Gastroenterology

## 2016-10-18 DIAGNOSIS — I5023 Acute on chronic systolic (congestive) heart failure: Secondary | ICD-10-CM

## 2016-10-18 LAB — PROTIME-INR
INR: 1.25
Prothrombin Time: 15.8 s — ABNORMAL HIGH (ref 11.4–15.2)

## 2016-10-18 LAB — CBC
HCT: 27.9 % — ABNORMAL LOW (ref 39.0–52.0)
Hemoglobin: 8.6 g/dL — ABNORMAL LOW (ref 13.0–17.0)
MCH: 28.7 pg (ref 26.0–34.0)
MCHC: 30.8 g/dL (ref 30.0–36.0)
MCV: 93 fL (ref 78.0–100.0)
Platelets: 130 K/uL — ABNORMAL LOW (ref 150–400)
RBC: 3 MIL/uL — ABNORMAL LOW (ref 4.22–5.81)
RDW: 18.4 % — ABNORMAL HIGH (ref 11.5–15.5)
WBC: 4.5 K/uL (ref 4.0–10.5)

## 2016-10-18 LAB — HEPARIN LEVEL (UNFRACTIONATED)
HEPARIN UNFRACTIONATED: 0.21 [IU]/mL — AB (ref 0.30–0.70)
Heparin Unfractionated: 0.24 [IU]/mL — ABNORMAL LOW (ref 0.30–0.70)

## 2016-10-18 LAB — PROCALCITONIN: Procalcitonin: <0.1 ng/mL

## 2016-10-18 MED ORDER — LEVALBUTEROL HCL 0.63 MG/3ML IN NEBU
0.6300 mg | INHALATION_SOLUTION | Freq: Two times a day (BID) | RESPIRATORY_TRACT | Status: DC
  Administered 2016-10-19 (×2): 0.63 mg via RESPIRATORY_TRACT
  Filled 2016-10-18 (×2): qty 3

## 2016-10-18 MED ORDER — HEPARIN (PORCINE) IN NACL 100-0.45 UNIT/ML-% IJ SOLN
1800.0000 [IU]/h | INTRAMUSCULAR | Status: DC
  Administered 2016-10-19: 1800 [IU]/h via INTRAVENOUS
  Filled 2016-10-18: qty 250

## 2016-10-18 MED ORDER — FUROSEMIDE 10 MG/ML IJ SOLN
40.0000 mg | Freq: Two times a day (BID) | INTRAMUSCULAR | Status: AC
  Administered 2016-10-18 (×2): 40 mg via INTRAVENOUS
  Filled 2016-10-18 (×2): qty 4

## 2016-10-18 NOTE — Progress Notes (Signed)
Pt standing scale weight 100.7 kg and height 5'11", pt weighed at 11:05am, normal morning weights are done at 6am. Also pt not given iv lasix prior to weighing.

## 2016-10-18 NOTE — Progress Notes (Signed)
PROGRESS NOTE   KYI ROMANELLO  RKY:[id number]    DOB: Jun 11, 1940    DOA: 10/11/2016  PCP: [name], NP   I have briefly reviewed patients previous medical records in [facility].  Brief Narrative:  76 year old male with history of AAA, atrial fibrillation on Coumadin anticoagulation, CAD, COPD, GERD, renal cell carcinoma status post nephrectomy, HTN and dementia presented with tachycardia and Malena. Patient was admitted and discharged on 10/07/16 for confusion with slurred speech, at which time an MRI of the brain was negative for acute CVA. Patient was seen by his PCP 6/6  for hospital follow-up, and noted to have a heart rate in the 150s. He was referred to [facility] where he also reported 2-3 days of melena. In the ED, his heart rate was 140, guaiac positive, INR 2.3 and hemoglobin 8.3. Status post EGD. Hemoglobin stable over the last 4 days. Treating for decompensated CHF.   Assessment & Plan:   Principal Problem:   GI bleed Active Problems:   Hyperlipidemia   Depression   Essential hypertension   Coronary atherosclerosis   Abdominal aortic aneurysm (HCC)   Atrial fibrillation, chronic (HCC)   Asthma   Acute on chronic respiratory failure with hypoxia (HCC)   Acute systolic CHF (congestive heart failure) ([place])   1. Acute upper GI bleed, secondary to multiple duodenal ulcers: Status post EGD. H pylori screen negative. Continue twice a day PPI. Coumadin on hold but tolerating IV heparin without recurrent GI bleed. Seems to have resolved. 2. Acute blood loss anemia: Hemoglobin dropped from 11.3-8.3 in 4 days. Transfused 3 units PRBCs thus far. Given history of CAD, keep hemoglobin at 8 g or higher. Hemoglobin stable in the last 4 days. 3. Acute on chronic systolic CHF: Patient reports 4 week history of progressively worsening dyspnea on exertion. Has been on home dose of Lasix 60 mg daily without significant improvement. Significantly volume overloaded on  exam. 2-D echo 08/15/16: LVEF 20-25 percent with diffuse hypokinesis. Change IV Lasix to 40 mg every 12 hours and monitor closely. Continue nitrates. Sees Dr. Stanford Breed, cardiology. 4. Acute on chronic hypoxic respiratory failure: Patient on home oxygen 3 L/m continuously. Worsening dyspnea likely secondary to decompensated CHF with some contribution from anemia. Low index of suspicion for pneumonia. Pro-calcitonin <0.1. Also bilateral atelectasis and small pleural effusions on chest x-ray. Incentive spirometry. Monitor. 5. Stage III chronic kidney disease: Baseline creatinine 1.8. Relatively stable. Monitor closely while on IV Lasix. 6. Permanent A. fib with RVR: Anticoagulation had been reversed on 10/11/16. Currently tolerating IV heparin. Consider resuming Coumadin 6/14 if no interventions planned by vascular surgery. 7. AAA: Ultrasound of aorta without evidence of entero-aortic fistula. No BRBPR noted during this hospital stay. MRA abdomen 6/12: Redemonstration of surgically treated infrarenal AAA with stent graft. Greatest diameter of the excluded aneurysm sac is relatively unchanged compared to most recent CT studies, approximately 9.1 cm. Discussed with vascular surgery M.D. on call and indicates that the findings aren't much different from CT January 2017 and primary vascular surgeon will see on 6/14. 8. Hypokalemia: Replaced. Magnesium 1.9. 9. CAD status post PCI: Asymptomatic of chest pain. 10. Dementia: Advanced chronic small vessel disease noted on MRI 10/06/16 consistent with vascular dementia. Family reported progressive cognitive decline and feels that he is no longer safe to live on his own. PT/OT suggest SNF placement and patient agreeable. DC to SNF when medically stable. 11. Hyperlipidemia: Statins. 12. Essential hypertension: Controlled. 13. BPH: Continue Flomax. 14.  Depression: Stable. Continue Zoloft. 15. Renal cell carcinoma, status post left nephrectomy. 16. COPD: Stable without  clinical bronchospasm. 17. Thrombocytopenia, chronic: Platelet counts gradually improving.   DVT prophylaxis: On IV heparin drip. Code Status: Full Family Communication: None at bedside Disposition: DC to SNF when medically stable   Consultants:  [facility]  Procedures:  EGD  Antimicrobials:  None    Subjective: Patient states that he has been having dyspnea on exertion for the last 4 weeks which has progressively gotten worse and now significant improvement in the hospital. Denies chest pain. Chronic mild intermittent dry cough for a few weeks. Leg swelling.   ROS: Uses 3 L/m oxygen at home. Does not use CPAP. No abdominal pain. No further black stools or blood in stools.  Objective:  Vitals:   10/18/16 0623 10/18/16 0713 10/18/16 1107 10/18/16 1441  BP: (!) 141/83   112/71  Pulse: 97     Resp: 18   19  Temp: 97.6 F (36.4 C)   98 F (36.7 C)  TempSrc:      SpO2: 95% 90%    Weight: 100.5 kg (221 lb 8 oz)  100.7 kg (222 lb 0.1 oz)   Height:   5\' 11"  (1.803 m)     Examination:  General exam: Pleasant elderly male lying comfortably propped up in bed. Respiratory system: Reduced breath sounds in the bases with few bibasal crackles. Rest of lung fields clear without wheezing or rhonchi. Respiratory effort normal. Cardiovascular system: S1 & S2 heard, RRR. No JVD, murmurs, rubs, gallops or clicks. 2+ pitting bilateral leg edema extending up to thighs and sacrum. Telemetry: A. fib with mostly controlled ventricular rate in the 90s. Occasional mild tachycardia. Gastrointestinal system: Abdomen is nondistended, soft and nontender. No organomegaly or masses felt. Normal bowel sounds heard. Central nervous system: Alert and oriented 2. No focal neurological deficits. Extremities: Symmetric 5 x 5 power. Skin: No rashes, lesions or ulcers Psychiatry: Judgement and insight appears slightly impaired. Mood & affect appropriate.     Data Reviewed: I have personally reviewed  following labs and imaging studies  CBC:  Recent Labs Lab 10/13/16 1429 10/14/16 0425 10/15/16 0531 10/16/16 0347 10/17/16 0343 10/18/16 0411  WBC 4.7  --  4.5 4.4 4.7 4.5  HGB 7.6* 7.8* 9.3* 8.5* 8.6* 8.6*  HCT 25.2* 24.6* 30.0* 27.2* 27.7* 27.9*  MCV 95.8  --  94.6 92.8 93.0 93.0  PLT 84*  --  98* 99* 116* 119*   Basic Metabolic Panel:  Recent Labs Lab 10/11/16 1930 10/12/16 1133 10/13/16 0942 10/14/16 0425 10/15/16 0531  NA  --  142 142 139 140  K  --  3.2* 3.2* 3.3* 3.8  CL  --  111 113* 111 110  CO2  --  20* 23 23 22   GLUCOSE  --  135* 105* 99 118*  BUN  --  74* 55* 44* 36*  CREATININE  --  2.06* 1.87* 1.89* 2.00*  CALCIUM  --  8.1* 8.3* 8.2* 8.4*  MG 2.2  --  2.0 1.9 1.9   Liver Function Tests:  Recent Labs Lab 10/12/16 1133 10/15/16 0531  AST 23 21  ALT 23 17  ALKPHOS 53 61  BILITOT 2.0* 2.0*  PROT 4.9* 5.1*  ALBUMIN 2.5* 2.6*   Coagulation Profile:  Recent Labs Lab 10/11/16 1930 10/12/16 1133 10/13/16 0321 10/18/16 0411  INR 1.45 1.33 1.33 1.25   Cardiac Enzymes:  Recent Labs Lab 10/11/16 1930 10/12/16 1133  TROPONINI 0.16* 0.70*  Recent Results (from the past 240 hour(s))  MRSA PCR Screening     Status: None   Collection Time: 10/11/16  5:55 PM  Result Value Ref Range Status   MRSA by PCR NEGATIVE NEGATIVE Final    Comment:        The GeneXpert MRSA Assay (FDA approved for NASAL specimens only), is one component of a comprehensive MRSA colonization surveillance program. It is not intended to diagnose MRSA infection nor to guide or monitor treatment for MRSA infections.          Radiology Studies: Dg Chest 2 View  Result Date: 10/17/2016 CLINICAL DATA:  Worsening shortness of breath over the past 2 months. Current history of hypertension, CHF, coronary artery disease, chronic kidney disease, atrial fibrillation and COPD. Former smoker. EXAM: CHEST  2 VIEW COMPARISON:  10/12/2016, 10/06/2016 and earlier.  FINDINGS: Since the examination 5 days ago, interval resolution of interstitial pulmonary edema. Persistent bilateral pleural effusions and associated consolidation in the lower lobes and right middle lobe. No new abnormalities. Baseline fibrotic changes again demonstrated. Cardiac silhouette mildly to moderately enlarged, unchanged. Thoracic aorta atherosclerotic and mildly tortuous, unchanged. Hilar and mediastinal contours otherwise unremarkable. Degenerative changes and DISH involving the thoracic spine. IMPRESSION: 1. Persistent bilateral pleural effusions and associated passive atelectasis versus pneumonia involving the lower lobes bilaterally and the right middle lobe since the examination 5 days ago. 2. Interval resolution of the interstitial pulmonary edema present on the examination 5 days ago. 3. No new abnormalities. Electronically Signed   By: Evangeline Dakin M.D.   On: 10/17/2016 20:22   Mr Jodene Nam Abdomen Wo Contrast  Result Date: 10/18/2016 CLINICAL DATA:  76 year old male with a history of infrarenal abdominal aortic aneurysm measuring as large as 8.8 cm before treatment with endovascular repair. Repair performed 06/03/2009, with main body from the right. There was enlargement on follow-up CT 09/08/2013 with largest diameter at that time 9.6 cm. Angiogram 10/13/2013 revealed no evidence of type 2 endoleak. Follow-up CT studies since the angiogram have demonstrated relatively stable size of the aneurysm sac, with greatest diameter measuring between 8.8 cm and 9 cm. Ultrasound performed 10/12/2016 demonstrates estimated diameter 9.4 cm. Patient presents for MR evaluation. Study was scheduled with gadolinium contrast, although the patient was unable to tolerate completion of the study and was performed without only. EXAM: MRI/MRA ABDOMEN WITHOUT CONTRAST TECHNIQUE: Multiplanar multisequence MR imaging was performed without the administration of intravenous contrast. COMPARISON:  Ultrasound 10/12/2016,  most recent CT 12/28/2015. Initial CT 05/31/2009 FINDINGS: Lower chest: Bilateral pleural effusions Vascular: Re- demonstration of infrarenal abdominal aortic aneurysm with changes of endovascular repair/exclusion. Greatest diameter of the excluded aneurysm sac on the current MR estimated 9.1 cm (axial images). This is not significantly changed in size from the recent CT of 12/28/2015 when the greatest diameter measured 8.9 cm. Nonvascular: T2 intense cyst of the hepatic dome measures 16 mm, unchanged from prior CT studies. Unremarkable MR appearance of small bowel and colon. Surgical changes of left nephrectomy. Unremarkable appearance of the right kidney with T2 intense cyst on the lateral cortex, unchanged from prior. Uniform marrow signal. Endplate changes compatible with Schmorl's nodes. No compression deformity. No bony canal narrowing. IMPRESSION: Re- demonstration of surgically treated infrarenal abdominal aortic aneurysm with stent graft. Greatest diameter of the excluded aneurysm sac is relatively unchanged compared to most recent CT studies, approximately 9.1 cm. Bilateral pleural effusions. Surgical changes of prior left nephrectomy. Signed, Dulcy Fanny. Earleen Newport, DO [facility]  Electronically Signed   By: Corrie Mckusick D.O.   On: 10/18/2016 14:35        Scheduled Meds: . atorvastatin  40 mg Oral Daily  . carvedilol  12.5 mg Oral BID WC  . furosemide  40 mg Intravenous BID  . isosorbide dinitrate  10 mg Oral TID  . [START ON 10/19/2016] levalbuterol  0.63 mg Nebulization BID  . pantoprazole  40 mg Oral BID  . polyethylene glycol  17 g Oral Daily  . sertraline  100 mg Oral QPM  . sodium chloride flush  3 mL Intravenous Q12H  . tamsulosin  0.4 mg Oral QPC breakfast   Continuous Infusions: . heparin 1,300 Units/hr (10/18/16 1102)     LOS: 7 days     Shawanna Zanders, MD, FACP, FHM. [facility] Pager [phone]  If  7PM-7AM, please contact night-coverage [email or web address] Password [facility] 10/18/2016, 4:21 PM ]

## 2016-10-18 NOTE — Progress Notes (Signed)
ANTICOAGULATION CONSULT NOTE - FOLLOW UP    HL = 0.21 (goal 0.3 - 0.5 units/mL) Heparin dosing weight = 96 kg   Assessment: 75 YOM continues on IV heparin for history of Afib in setting of recent GIB and while Coumadin is on hold for possible AAA surgery.  Heparin level remains sub-therapeutic and has trended down.  No issue with heparin infusion per RN; no bleeding reported.   Plan: - Increase heparin gtt to 1550 units/hr - Check 8 hr heparin level    Keslie Gritz D. Mina Marble, PharmD, BCPS 10/18/2016, 8:10 PM

## 2016-10-18 NOTE — Progress Notes (Signed)
Occupational Therapy Treatment Patient Details Name: EMRYS MCKAMIE MRN: [id number] DOB: 1941-04-18 Today's Date: 10/18/2016    History of present illness Pt admitted with GIB. PMH: Recent admission after being found down outside of his home with weakness and AMS, CVA, shingles, OA, migraines, HTN, COPD on 3L, CHF, Depression, CKD, CAD, AAA.   OT comments  Pt continues to demonstrate impaired balance without appreciation of his risk for falling, strongly encouraged RW at all times. Began education in energy conservation strategies. Pt continues to be appropriate for ST rehab in SNF.  Follow Up Recommendations  SNF    Equipment Recommendations  None recommended by OT    Recommendations for Other Services      Precautions / Restrictions Precautions Precautions: Fall       Mobility Bed Mobility               General bed mobility comments: pt seated at EOB upon arrival and at end of session  Transfers Overall transfer level: Needs assistance Equipment used: None Transfers: Sit to/from Stand Sit to Stand: Min guard         General transfer comment: min guard assist as pt declined use of RW    Balance     Sitting balance-Leahy Scale: Good       Standing balance-Leahy Scale: Fair                             ADL either performed or assessed with clinical judgement   ADL Overall ADL's : Needs assistance/impaired     Grooming: Wash/dry hands;Standing;Brushing hair;Supervision/safety           Upper Body Dressing : Set up;Sitting Upper Body Dressing Details (indicate cue type and reason): front opening gown     Toilet Transfer: Min guard;Ambulation (furniture walked around room)   Thor and Hygiene: Min guard;Sit to/from stand       Functional mobility during ADLs: Minimal assistance (refusing RW, but holding furniture when ambulating around be) General ADL Comments: Began educating pt in energy conservation  strategies.     Vision       Perception     Praxis      Cognition Arousal/Alertness: Awake/alert Behavior During Therapy: WFL for tasks assessed/performed Overall Cognitive Status: No family/caregiver present to determine baseline cognitive functioning Area of Impairment: Safety/judgement                         Safety/Judgement: Decreased awareness of deficits              Exercises     Shoulder Instructions       General Comments      Pertinent Vitals/ Pain       Pain Assessment: No/denies pain  Home Living                                          Prior Functioning/Environment              Frequency  Min 2X/week        Progress Toward Goals  OT Goals(current goals can now be found in the care plan section)  Progress towards OT goals: Progressing toward goals  Acute Rehab OT Goals Patient Stated Goal: to return to independence OT Goal Formulation: With patient Time For Goal Achievement:  10/27/16 Potential to Achieve Goals: Good  Plan Discharge plan remains appropriate    Co-evaluation                 AM-PAC PT "6 Clicks" Daily Activity     Outcome Measure   Help from another person eating meals?: None Help from another person taking care of personal grooming?: A Little Help from another person toileting, which includes using toliet, bedpan, or urinal?: A Little Help from another person bathing (including washing, rinsing, drying)?: A Little Help from another person to put on and taking off regular upper body clothing?: None Help from another person to put on and taking off regular lower body clothing?: A Little 6 Click Score: 20    End of Session Equipment Utilized During Treatment: Gait belt;Oxygen  OT Visit Diagnosis: Unsteadiness on feet (R26.81);History of falling (Z91.81);Other symptoms and signs involving cognitive function   Activity Tolerance Patient tolerated treatment well   Patient Left  in bed;with call bell/phone within reach   Nurse Communication          Time: 1330-1346 OT Time Calculation (min): 16 min  Charges: OT General Charges $OT Visit: 1 Procedure OT Treatments $Self Care/Home Management : 8-22 mins    Malka So 10/18/2016, 2:00 PM  [phone]

## 2016-10-18 NOTE — Progress Notes (Signed)
ANTICOAGULATION CONSULT NOTE - Rea for Heparin  Indication:  Afib, with recent GI bleed  Allergies  Allergen Reactions  . Ace Inhibitors Cough    Patient Measurements: Height: 5\' 11"  (180.3 cm) Weight: 221 lb 8 oz (100.5 kg) IBW/kg (Calculated) : 75.3  Vital Signs: Temp: 97.6 F (36.4 C) (06/13 0623) BP: 141/83 (06/13 0623) Pulse Rate: 97 (06/13 0623)  Labs:  Recent Labs  10/16/16 0347 10/17/16 0343 10/18/16 0411 10/18/16 0724  HGB 8.5* 8.6* 8.6*  --   HCT 27.2* 27.7* 27.9*  --   PLT 99* 116* 130*  --   LABPROT  --   --  15.8*  --   INR  --   --  1.25  --   HEPARINUNFRC  --   --   --  0.24*    Estimated Creatinine Clearance: 39.7 mL/min (A) (by C-G formula based on SCr of 2 mg/dL (H)).   Medical History: Past Medical History:  Diagnosis Date  . AAA (abdominal aortic aneurysm) (HCC)    9.1 cm repair 05/2009 ( ENDOVASCULAR REPAIR ).  MORE RECENT RE-EVALUATION OF ENLARGING AAA BY DR. Trula Slade AND BY DR. Kathlene Cote  MAY 2015 - PT STATES HE UNDERSTANDS THAT NOTHING IS LEAKING AT PRESENT TIME AND HE IS TO FOLLOW UP WITH HIS DOCTORS FOR FUTURE MONITORING I  . Arthritis    LEFT HAND, RT KNEE (08/14/2016)  . Basal cell carcinoma    "right side of my nose; think they burned it off" (08/14/2016)  . CAD (coronary artery disease)    a. s/p PCI to LAD/diagonal, subtotal of PDA 2006. b. Negative nuc 2014.  Marland Kitchen CHF (congestive heart failure) ([place]) 08/14/2016  . Chronic atrial fibrillation ([place])   . CKD (chronic kidney disease), stage III    a. stage III-IV, Cr baseline appears 1.8-2.0  . COPD (chronic obstructive pulmonary disease) ([place])   . Depression   . GERD (gastroesophageal reflux disease)   . History of kidney stones   . History of pneumothorax    LONG TIME AGO  . Hyperlipidemia   . Hypertension   . Migraine    "very rare the last 20 years" (08/14/2016)  . Myocardial infarction ([place]) 2006  . Osteoarthritis   . Renal cell carcinoma ([place])    . Shingles outbreak    PT NOTICED SKIN RASH RT GROIN AND RIGHT TRUNK ON MONDAY 10/27/13 - NOT A LOT OF DISCOMFORT- NOT ON ANY ORAL MEDS TO TREAT.  . Sleep apnea    "dx'd; didn't try mask" (08/14/2016)  . Stroke [facility])    "CT showed I've had old strokes; never knew about it before today" (08/14/2016)    Assessment: 76 year old male on warfarin prior to admission admitted for GI bleed.  INR reversed with Kcentra on admission with plans to hold anti-coagulation for 7 days from 6/6.  Initial plan was to start Warfarin today, however now on heparin pending surgical eval for AAA.    Heparin level is subtherapeutic on 1100 units/hr.  CBC stable.  Goal of Therapy:  Heparin level = 0.3-0.5 units/ml (due to recent bleed) Monitor platelets by anticoagulation protocol: Yes   Plan:  Increase heparin infusion to 1300 units / hr Heparin level in 8 hours Daily heparin level / CBC Follow-up plan to restart Warfarin  [employer], Pharm.D., BCPS Clinical Pharmacist Pager: [phone] Clinical phone for 10/18/2016 from 8:30-4:00 is [phone]. After 4pm, please call Main Rx ([phone]) for assistance. 10/18/2016 10:30 AM

## 2016-10-19 DIAGNOSIS — I714 Abdominal aortic aneurysm, without rupture: Secondary | ICD-10-CM

## 2016-10-19 DIAGNOSIS — I472 Ventricular tachycardia: Secondary | ICD-10-CM

## 2016-10-19 DIAGNOSIS — E876 Hypokalemia: Secondary | ICD-10-CM

## 2016-10-19 DIAGNOSIS — J90 Pleural effusion, not elsewhere classified: Secondary | ICD-10-CM

## 2016-10-19 LAB — BASIC METABOLIC PANEL
ANION GAP: 8 (ref 5–15)
ANION GAP: 9 (ref 5–15)
BUN: 29 mg/dL — ABNORMAL HIGH (ref 6–20)
BUN: 26 mg/dL — ABNORMAL HIGH (ref 6–20)
CHLORIDE: 109 mmol/L (ref 101–111)
CO2: 24 mmol/L (ref 22–32)
CO2: 22 mmol/L (ref 22–32)
Calcium: 7.9 mg/dL — ABNORMAL LOW (ref 8.9–10.3)
Calcium: 7.8 mg/dL — ABNORMAL LOW (ref 8.9–10.3)
Chloride: 107 mmol/L (ref 101–111)
Creatinine, Ser: 1.87 mg/dL — ABNORMAL HIGH (ref 0.61–1.24)
Creatinine, Ser: 2.06 mg/dL — ABNORMAL HIGH (ref 0.61–1.24)
GFR calc Af Amer: 39 mL/min — ABNORMAL LOW (ref >60)
GFR, EST AFRICAN AMERICAN: 35 mL/min — AB (ref >60)
GFR, EST NON AFRICAN AMERICAN: 34 mL/min — AB (ref >60)
GFR, EST NON AFRICAN AMERICAN: 30 mL/min — AB (ref >60)
GLUCOSE: 82 mg/dL (ref 65–99)
Glucose, Bld: 81 mg/dL (ref 65–99)
POTASSIUM: 2.8 mmol/L — AB (ref 3.5–5.1)
POTASSIUM: 3.4 mmol/L — AB (ref 3.5–5.1)
SODIUM: 140 mmol/L (ref 135–145)
Sodium: 139 mmol/L (ref 135–145)

## 2016-10-19 LAB — CBC
HEMATOCRIT: 26.5 % — AB (ref 39.0–52.0)
Hemoglobin: 8.2 g/dL — ABNORMAL LOW (ref 13.0–17.0)
MCH: 28.9 pg (ref 26.0–34.0)
MCHC: 30.9 g/dL (ref 30.0–36.0)
MCV: 93.3 fL (ref 78.0–100.0)
Platelets: 142 10*3/uL — ABNORMAL LOW (ref 150–400)
RBC: 2.84 MIL/uL — AB (ref 4.22–5.81)
RDW: 18.3 % — ABNORMAL HIGH (ref 11.5–15.5)
WBC: 4.4 10*3/uL (ref 4.0–10.5)

## 2016-10-19 LAB — HEPARIN LEVEL (UNFRACTIONATED)
HEPARIN UNFRACTIONATED: 0.17 [IU]/mL — AB (ref 0.30–0.70)
Heparin Unfractionated: 1.04 IU/mL — ABNORMAL HIGH (ref 0.30–0.70)

## 2016-10-19 LAB — MAGNESIUM: MAGNESIUM: 1.6 mg/dL — AB (ref 1.7–2.4)

## 2016-10-19 MED ORDER — ENSURE ENLIVE PO LIQD
237.0000 mL | Freq: Two times a day (BID) | ORAL | Status: DC
  Administered 2016-10-20 – 2016-10-26 (×14): 237 mL via ORAL

## 2016-10-19 MED ORDER — WARFARIN SODIUM 5 MG PO TABS
5.0000 mg | ORAL_TABLET | Freq: Once | ORAL | Status: AC
  Administered 2016-10-19: 5 mg via ORAL
  Filled 2016-10-19: qty 1

## 2016-10-19 MED ORDER — WARFARIN - PHARMACIST DOSING INPATIENT
Freq: Every day | Status: DC
  Administered 2016-10-19 – 2016-10-26 (×4)

## 2016-10-19 MED ORDER — HEPARIN (PORCINE) IN NACL 100-0.45 UNIT/ML-% IJ SOLN
1350.0000 [IU]/h | INTRAMUSCULAR | Status: DC
  Administered 2016-10-19: 1600 [IU]/h via INTRAVENOUS
  Administered 2016-10-20: 1500 [IU]/h via INTRAVENOUS
  Administered 2016-10-21: 1300 [IU]/h via INTRAVENOUS
  Administered 2016-10-21: 1250 [IU]/h via INTRAVENOUS
  Administered 2016-10-22 (×2): 1300 [IU]/h via INTRAVENOUS
  Administered 2016-10-23: 1350 [IU]/h via INTRAVENOUS
  Filled 2016-10-19 (×7): qty 250

## 2016-10-19 MED ORDER — FUROSEMIDE 10 MG/ML IJ SOLN
40.0000 mg | Freq: Two times a day (BID) | INTRAMUSCULAR | Status: DC
  Administered 2016-10-19 – 2016-10-20 (×3): 40 mg via INTRAVENOUS
  Filled 2016-10-19 (×3): qty 4

## 2016-10-19 MED ORDER — MAGNESIUM SULFATE 2 GM/50ML IV SOLN
2.0000 g | Freq: Once | INTRAVENOUS | Status: AC
  Administered 2016-10-19: 2 g via INTRAVENOUS
  Filled 2016-10-19: qty 50

## 2016-10-19 MED ORDER — POTASSIUM CHLORIDE CRYS ER 20 MEQ PO TBCR
40.0000 meq | EXTENDED_RELEASE_TABLET | Freq: Once | ORAL | Status: AC
  Administered 2016-10-19: 40 meq via ORAL
  Filled 2016-10-19: qty 2

## 2016-10-19 MED ORDER — POTASSIUM CHLORIDE CRYS ER 20 MEQ PO TBCR
30.0000 meq | EXTENDED_RELEASE_TABLET | ORAL | Status: AC
  Administered 2016-10-19 (×2): 30 meq via ORAL
  Filled 2016-10-19 (×2): qty 1

## 2016-10-19 NOTE — Progress Notes (Signed)
PROGRESS NOTE   Mark [name]  HCW:[id number]    DOB: 1941/03/19    DOA: 10/11/2016  PCP: [name], NP   I have briefly reviewed patients previous medical records in [facility].  Brief Narrative:  77 year old male with history of AAA, atrial fibrillation on Coumadin anticoagulation, CAD, COPD, GERD, renal cell carcinoma status post nephrectomy, HTN and dementia presented with tachycardia and Malena. Patient was admitted and discharged on 10/07/16 for confusion with slurred speech, at which time an MRI of the brain was negative for acute CVA. Patient was seen by his PCP 6/6  for hospital follow-up, and noted to have a heart rate in the 150s. He was referred to [facility] [place] [facility] where he also reported 2-3 days of melena. In the ED, his heart rate was 140, guaiac positive, INR 2.3 and hemoglobin 8.3. Status post EGD. Hemoglobin stable over the last 4 days. Treating for decompensated CHF.   Assessment & Plan:   Principal Problem:   GI bleed Active Problems:   Hyperlipidemia   Depression   Essential hypertension   Coronary atherosclerosis   Abdominal aortic aneurysm (HCC)   Atrial fibrillation, chronic (HCC)   Asthma   Acute on chronic respiratory failure with hypoxia (HCC)   Acute systolic CHF (congestive heart failure) ([place])   1. Acute upper GI bleed, secondary to multiple duodenal ulcers: Status post EGD. H pylori screen negative. Continue twice a day PPI. Coumadin on hold but tolerating IV heparin without recurrent GI bleed. Seems to have resolved.Discussed with Dr.Brabham, [facility] and input appreciated >do not think he has a aorto enteric fistula. 2. Acute blood loss anemia: Hemoglobin dropped from 11.3-8.3 in 4 days. Transfused 3 units PRBCs thus far. Given history of CAD, keep hemoglobin at 8 g or higher. Hemoglobin stable in the last 5 days. 3. Acute on chronic systolic CHF: Patient reports 4 week history of progressively worsening dyspnea on exertion. Has been on home  dose of Lasix 60 mg daily without significant improvement. Significantly volume overloaded on exam. 2-D echo 08/15/16: LVEF 20-25 percent with diffuse hypokinesis. Changed IV Lasix to 40 mg every 12 hours and monitor closely. Continue nitrates. Sees Dr. Stanford Breed, cardiology. Improving. Continue IV diuresis. Discussed with nursing regarding strict intake output monitoring. 4. Acute on chronic hypoxic respiratory failure: Patient on home oxygen 3 L/m continuously. Worsening dyspnea likely secondary to decompensated CHF with some contribution from anemia. Low index of suspicion for pneumonia. Pro-calcitonin <0.1. Also bilateral atelectasis and small pleural effusions on chest x-ray. Incentive spirometry. Monitor. 5. Stage III chronic kidney disease: Baseline creatinine 1.8. Relatively stable. Monitor closely while on IV Lasix. Creatinine has improved slightly to 1.8. 6. Permanent A. fib with RVR: Anticoagulation had been reversed on 10/11/16. Currently tolerating IV heparin. No interventions planned and hence resume Coumadin 6/14. 7. AAA: Ultrasound of aorta without evidence of entero-aortic fistula. No BRBPR noted during this hospital stay. MRA abdomen 6/12: Redemonstration of surgically treated infrarenal AAA with stent graft. Greatest diameter of the excluded aneurysm sac is relatively unchanged compared to most recent CT studies, approximately 9.1 cm. discussed with vascular surgery and their input appreciated: His AAA has remained stable over time and no intervention is currently recommended. Follow-up with vascular surgery in 6 months to with repeat ultrasound. Does not think that he has a aorto enteric fistula. There is no stranding on his MRI around the graft. 8. Hypokalemia: Replace aggressively and follow. Magnesium 1.9. 9. CAD status post PCI: Asymptomatic of chest pain.  10. Dementia: Advanced chronic small vessel disease noted on MRI 10/06/16 consistent with vascular dementia. Family reported progressive  cognitive decline and feels that he is no longer safe to live on his own. PT/OT suggest SNF placement and patient agreeable. DC to SNF when medically stable. 11. Hyperlipidemia: Statins. 12. Essential hypertension: Controlled. 13. BPH: Continue Flomax. 14. Depression: Stable. Continue Zoloft. 15. Renal cell carcinoma, status post left nephrectomy. 16. COPD: Stable without clinical bronchospasm. 17. Thrombocytopenia, chronic: Platelet counts gradually improving. 18. NSVT: 13 beat NSVT noted on 6/13 at 8:41 AM. Does not seem like A. fib with aberrancy. Possibly due to his cardiomyopathy. Continue monitoring on telemetry. Replace potassium and magnesium close to 4 and 2 respectively. May consider increasing carvedilol dose but soft blood pressures limiting factor.   DVT prophylaxis: On IV heparin drip. Code Status: Full Family Communication: None at bedside Disposition: DC to SNF when medically stable   Consultants:  [facility]  Procedures:  EGD  Antimicrobials:  None    Subjective: He states that he urinated well yesterday. Dyspnea somewhat better. No chest pain or palpitations reported. Had normal brown color stools this morning without blood.  ROS: Uses 3 L/m oxygen at home. Does not use CPAP. No dizziness or lightheadedness reported.  Objective:  Vitals:   10/19/16 0209 10/19/16 0533 10/19/16 0725 10/19/16 1407  BP:  119/82  97/66  Pulse:  (!) 105  97  Resp:  20  20  Temp:  97.7 F (36.5 C)  97.3 F (36.3 C)  TempSrc:      SpO2:  96% (!) 6% 93%  Weight: 78.9 kg (174 lb)     Height:        Examination:  General exam: Pleasant elderly male sitting up in bed eating breakfast this morning. Respiratory system: Reduced breath sounds in the bases with few bibasal crackles-improved compared to yesterday. Rest of lung fields clear without wheezing or rhonchi. Respiratory effort normal. Cardiovascular system: S1 & S2 heard, irregularly irregular. No JVD, murmurs, rubs,  gallops or clicks. 2+ pitting bilateral leg edema extending up to thighs and sacrum-slightly better. Telemetry: A. fib with mostly controlled ventricular rate in the 80s-90s. Occasional mild tachycardia. Gastrointestinal system: Abdomen is nondistended, soft and nontender. No organomegaly or masses felt. Normal bowel sounds heard. Central nervous system: Alert and oriented 2. No focal neurological deficits. Extremities: Symmetric 5 x 5 power. Skin: No rashes, lesions or ulcers Psychiatry: Judgement and insight appears slightly impaired. Mood & affect appropriate.     Data Reviewed: I have personally reviewed following labs and imaging studies  CBC:  Recent Labs Lab 10/15/16 0531 10/16/16 0347 10/17/16 0343 10/18/16 0411 10/19/16 0354  WBC 4.5 4.4 4.7 4.5 4.4  HGB 9.3* 8.5* 8.6* 8.6* 8.2*  HCT 30.0* 27.2* 27.7* 27.9* 26.5*  MCV 94.6 92.8 93.0 93.0 93.3  PLT 98* 99* 116* 130* 956*   Basic Metabolic Panel:  Recent Labs Lab 10/13/16 0942 10/14/16 0425 10/15/16 0531 10/19/16 0354  NA 142 139 140 139  K 3.2* 3.3* 3.8 2.8*  CL 113* 111 110 107  CO2 23 23 22 24   GLUCOSE 105* 99 118* 82  BUN 55* 44* 36* 29*  CREATININE 1.87* 1.89* 2.00* 1.87*  CALCIUM 8.3* 8.2* 8.4* 7.9*  MG 2.0 1.9 1.9  --    Liver Function Tests:  Recent Labs Lab 10/15/16 0531  AST 21  ALT 17  ALKPHOS 61  BILITOT 2.0*  PROT 5.1*  ALBUMIN 2.6*   Coagulation Profile:  Recent Labs Lab 10/13/16 0321 10/18/16 0411  INR 1.33 1.25   Cardiac Enzymes: No results for input(s): CKTOTAL, CKMB, CKMBINDEX, TROPONINI in the last 168 hours.   Recent Results (from the past 240 hour(s))  MRSA PCR Screening     Status: None   Collection Time: 10/11/16  5:55 PM  Result Value Ref Range Status   MRSA by PCR NEGATIVE NEGATIVE Final    Comment:        The GeneXpert MRSA Assay (FDA approved for NASAL specimens only), is one component of a comprehensive MRSA colonization surveillance program. It is  not intended to diagnose MRSA infection nor to guide or monitor treatment for MRSA infections.          Radiology Studies: Dg Chest 2 View  Result Date: 10/17/2016 CLINICAL DATA:  Worsening shortness of breath over the past 2 months. Current history of hypertension, CHF, coronary artery disease, chronic kidney disease, atrial fibrillation and COPD. Former smoker. EXAM: CHEST  2 VIEW COMPARISON:  10/12/2016, 10/06/2016 and earlier. FINDINGS: Since the examination 5 days ago, interval resolution of interstitial pulmonary edema. Persistent bilateral pleural effusions and associated consolidation in the lower lobes and right middle lobe. No new abnormalities. Baseline fibrotic changes again demonstrated. Cardiac silhouette mildly to moderately enlarged, unchanged. Thoracic aorta atherosclerotic and mildly tortuous, unchanged. Hilar and mediastinal contours otherwise unremarkable. Degenerative changes and DISH involving the thoracic spine. IMPRESSION: 1. Persistent bilateral pleural effusions and associated passive atelectasis versus pneumonia involving the lower lobes bilaterally and the right middle lobe since the examination 5 days ago. 2. Interval resolution of the interstitial pulmonary edema present on the examination 5 days ago. 3. No new abnormalities. Electronically Signed   By: Evangeline Dakin M.D.   On: 10/17/2016 20:22   Mr Jodene Nam Abdomen Wo Contrast  Result Date: 10/18/2016 CLINICAL DATA:  76 year old male with a history of infrarenal abdominal aortic aneurysm measuring as large as 8.8 cm before treatment with endovascular repair. Repair performed 06/03/2009, with main body from the right. There was enlargement on follow-up CT 09/08/2013 with largest diameter at that time 9.6 cm. Angiogram 10/13/2013 revealed no evidence of type 2 endoleak. Follow-up CT studies since the angiogram have demonstrated relatively stable size of the aneurysm sac, with greatest diameter measuring between 8.8 cm  and 9 cm. Ultrasound performed 10/12/2016 demonstrates estimated diameter 9.4 cm. Patient presents for MR evaluation. Study was scheduled with gadolinium contrast, although the patient was unable to tolerate completion of the study and was performed without only. EXAM: MRI/MRA ABDOMEN WITHOUT CONTRAST TECHNIQUE: Multiplanar multisequence MR imaging was performed without the administration of intravenous contrast. COMPARISON:  Ultrasound 10/12/2016, most recent CT 12/28/2015. Initial CT 05/31/2009 FINDINGS: Lower chest: Bilateral pleural effusions Vascular: Re- demonstration of infrarenal abdominal aortic aneurysm with changes of endovascular repair/exclusion. Greatest diameter of the excluded aneurysm sac on the current MR estimated 9.1 cm (axial images). This is not significantly changed in size from the recent CT of 12/28/2015 when the greatest diameter measured 8.9 cm. Nonvascular: T2 intense cyst of the hepatic dome measures 16 mm, unchanged from prior CT studies. Unremarkable MR appearance of small bowel and colon. Surgical changes of left nephrectomy. Unremarkable appearance of the right kidney with T2 intense cyst on the lateral cortex, unchanged from prior. Uniform marrow signal. Endplate changes compatible with Schmorl's nodes. No compression deformity. No bony canal narrowing. IMPRESSION: Re- demonstration of surgically treated infrarenal abdominal aortic aneurysm with stent graft. Greatest diameter of the excluded aneurysm sac is relatively unchanged  compared to most recent CT studies, approximately 9.1 cm. Bilateral pleural effusions. Surgical changes of prior left nephrectomy. Signed, Dulcy Fanny. Earleen Newport, DO [facility] Electronically Signed   By: Corrie Mckusick D.O.   On: 10/18/2016 14:35        Scheduled Meds: . atorvastatin  40 mg Oral Daily  . carvedilol  12.5 mg Oral BID WC  . furosemide  40 mg Intravenous BID  . isosorbide dinitrate   10 mg Oral TID  . levalbuterol  0.63 mg Nebulization BID  . pantoprazole  40 mg Oral BID  . polyethylene glycol  17 g Oral Daily  . sertraline  100 mg Oral QPM  . sodium chloride flush  3 mL Intravenous Q12H  . tamsulosin  0.4 mg Oral QPC breakfast  . warfarin  5 mg Oral ONCE-1800  . Warfarin - Pharmacist Dosing Inpatient   Does not apply q1800   Continuous Infusions: . heparin       LOS: 8 days     Deseri Loss, MD, FACP, FHM. [facility] Pager [phone]  If 7PM-7AM, please contact night-coverage [email or web address] Password [facility] 10/19/2016, 4:18 PM ]

## 2016-10-19 NOTE — Progress Notes (Signed)
Physical Therapy Treatment Patient Details Name: Mark [name] MRN: [id number] DOB: May 08, 1941 Today's Date: 10/19/2016    History of Present Illness Pt admitted with GIB. PMH: Recent admission after being found down outside of his home with weakness and AMS, CVA, shingles, OA, migraines, HTN, COPD on 3L, CHF, Depression, CKD, CAD, AAA.    PT Comments    Pt demonstrates improved tolerance for gait distance this session. Pt is able to perform bed mobs with supervision and gait without an AD with improved sequencing and decreased assistance required this session. Pt continues to require SNF at discharge in order to assist with return to independence.     Follow Up Recommendations  SNF     Equipment Recommendations  Rolling walker with 5" wheels    Recommendations for Other Services       Precautions / Restrictions Precautions Precautions: Fall Restrictions Weight Bearing Restrictions: No    Mobility  Bed Mobility Overal bed mobility: Needs Assistance Bed Mobility: Supine to Sit     Supine to sit: Supervision     General bed mobility comments: Supervision to get EOB with HOB elevated  Transfers Overall transfer level: Needs assistance Equipment used: None Transfers: Sit to/from Stand Sit to Stand: Min guard         General transfer comment: Min guard for safety from EOB  Ambulation/Gait Ambulation/Gait assistance: Min guard;Min assist Ambulation Distance (Feet): 75 Feet Assistive device: None Gait Pattern/deviations: Step-through pattern;Decreased stride length;Staggering left;Staggering right Gait velocity: decreased Gait velocity interpretation: Below normal speed for age/gender General Gait Details: Pt continues to decline RW use. Able to perform gait with improved distance tolerance, but requries a short rest break halfway before continuing back to his room. O2 in place    Stairs            Wheelchair Mobility    Modified Rankin (Stroke  Patients Only)       Balance Overall balance assessment: Needs assistance Sitting-balance support: Feet supported Sitting balance-Leahy Scale: Good     Standing balance support: Single extremity supported Standing balance-Leahy Scale: Fair                              Cognition Arousal/Alertness: Awake/alert Behavior During Therapy: WFL for tasks assessed/performed Overall Cognitive Status: No family/caregiver present to determine baseline cognitive functioning                                        Exercises      General Comments        Pertinent Vitals/Pain Pain Assessment: No/denies pain    Home Living                      Prior Function            PT Goals (current goals can now be found in the care plan section) Acute Rehab PT Goals Patient Stated Goal: to return to independence Progress towards PT goals: Progressing toward goals    Frequency    Min 3X/week      PT Plan Current plan remains appropriate    Co-evaluation              AM-PAC PT "6 Clicks" Daily Activity  Outcome Measure  Difficulty turning over in bed (including adjusting bedclothes, sheets and blankets)?: None Difficulty moving from  lying on back to sitting on the side of the bed? : None Difficulty sitting down on and standing up from a chair with arms (e.g., wheelchair, bedside commode, etc,.)?: A Little Help needed moving to and from a bed to chair (including a wheelchair)?: A Little Help needed walking in hospital room?: A Little Help needed climbing 3-5 steps with a railing? : A Little 6 Click Score: 20    End of Session Equipment Utilized During Treatment: Gait belt;Oxygen Activity Tolerance: Patient limited by fatigue Patient left: in chair;with call bell/phone within reach Nurse Communication: Mobility status PT Visit Diagnosis: Unsteadiness on feet (R26.81);Other abnormalities of gait and mobility (R26.89)     Time:  1610-9604 PT Time Calculation (min) (ACUTE ONLY): 31 min  Charges:  $Gait Training: 8-22 mins $Therapeutic Activity: 8-22 mins                    G Codes:       Scheryl Marten PT, DPT  [phone]    Mark [name] 10/19/2016, 1:04 PM

## 2016-10-19 NOTE — Care Management Important Message (Signed)
Important Message  Patient Details  Name: Mark [name] MRN: [id number] Date of Birth: 01/28/1941   Medicare Important Message Given:  Yes    Nathen May 10/19/2016, 9:29 AM

## 2016-10-19 NOTE — Progress Notes (Addendum)
ANTICOAGULATION CONSULT NOTE - [place] for Heparin  Indication:  Afib, with recent GI bleed  Allergies  Allergen Reactions  . Ace Inhibitors Cough    Patient Measurements: Height: 5\' 11"  (180.3 cm) Weight: 174 lb (78.9 kg) IBW/kg (Calculated) : 75.3  Vital Signs: Temp: 97.3 F (36.3 C) (06/14 1407) BP: 97/66 (06/14 1407) Pulse Rate: 97 (06/14 1407)  Labs:  Recent Labs  10/17/16 0343 10/18/16 0411  10/18/16 1855 10/19/16 0354 10/19/16 1319  HGB 8.6* 8.6*  --   --  8.2*  --   HCT 27.7* 27.9*  --   --  26.5*  --   PLT 116* 130*  --   --  142*  --   LABPROT  --  15.8*  --   --   --   --   INR  --  1.25  --   --   --   --   HEPARINUNFRC  --   --   < > 0.21* 0.17* 1.04*  CREATININE  --   --   --   --  1.87*  --   < > = values in this interval not displayed.  Estimated Creatinine Clearance: 37.5 mL/min (A) (by C-G formula based on SCr of 1.87 mg/dL (H)).   Assessment: 76 year old male on warfarin prior to admission admitted for GI bleed.  INR reversed with Kcentra on admission with plans to hold anti-coagulation for 7 days from 6/6.  Initial plan was to start Warfarin today, however now on IV heparin pending surgical eval for AAA.    Heparin level was subtherapeutic x2, and rate was increased to 1800 units/hr. Level drawn this afternoon elevated at 1.04. I went to see patient and confirmed blood was drawn from opposite arm of heparin infusion. Patient denied bleeding in last 24h.  Goal of Therapy:  Heparin level 0.3-0.5 units/ml (due to recent bleed) Monitor platelets by anticoagulation protocol: Yes   Plan:  -Hold heparin x1h, then decrease to 1600 units/hr starting at 1600 this afternoon (spoke about this plan with RN Ellard Artis) -8h heparin level at midnight  -Heparin level and CBC daily -Follow-up plans to restart warfarin  Graylee Arutyunyan D. Mahesh Sizemore, PharmD, BCPS Clinical Pharmacist Pager: [phone] Clinical Phone for 10/19/2016 until 3:30pm:  [phone] If after 3:30pm, please call main pharmacy at [phone] 10/19/2016 2:26 PM    ADDENDUM Not to pursue surgery for AAA per vascular- cleared to restart warfarin. INR yesterday 2.5. Home dose was 5mg  except 7.5mg  on Wednesdays.  Plan: Heparin as above Warfarin 5mg  po x1 tonight Daily INR  Chandler Stofer D. Ashya Nicolaisen, PharmD, [place] Clinical Pharmacist Pager: [phone]

## 2016-10-19 NOTE — Progress Notes (Signed)
Initial Nutrition Assessment  DOCUMENTATION CODES:   Not applicable  INTERVENTION:   -Ensure Enlive po BID, each supplement provides 350 kcal and 20 grams of protein  NUTRITION DIAGNOSIS:   Predicted suboptimal nutrient intake related to poor appetite as evidenced by percent weight loss.  GOAL:   Patient will meet greater than or equal to 90% of their needs  MONITOR:   PO intake, Supplement acceptance, Diet advancement, Labs, Weight trends, Skin, I & O's  REASON FOR ASSESSMENT:   Malnutrition Screening Tool    ASSESSMENT:   76 year old male with history of AAA, atrial fibrillation on Coumadin, coronary artery disease, COPD, gastroesophageal reflux disease, renal cell carcinoma status post nephrectomy, hypertension, dementia, who presents with tachycardia and melena.  Pt admitted with GIB.   6/7- s/p upper endoscopy, which revealed small hiatal hernia, multiple medical non-bleeding ulcers, and moderate Schatzki ring  Spoke with pt, who reports he generally has a good appetite, consuming 3 meals per day. However, pt shares appetite had declined for 2-3 weeks PTA. However, he reports appetite has returned since hospitalization. Noted meal completion 100%. He shares he consumed "every bite" of his pancake and eggs breakfast this morning.  Pt reports a large weight loss over the past 2-3 weeks. He reports UBW of around 220#. Reviewed wt hx, which reveals a 20.7% wt loss over the past month. When questioned about weight loss, pt stated "that's what happens when you don't eat, you lose weight".   Nutrition-Focused physical exam completed. Findings are mild (orbital region) fat depletion, no muscle depletion, and no edema.   Pt prematurely ended visit due to to need to use the rest room.   Labs reviewed: K: 2.8.   Diet Order:  Diet Heart Room service appropriate? Yes; Fluid consistency: Thin  Skin:  Reviewed, no issues  Last BM:  10/18/16  Height:   Ht Readings from Last 1  Encounters:  10/18/16 5\' 11"  (1.803 m)    Weight:   Wt Readings from Last 1 Encounters:  10/19/16 174 lb (78.9 kg)    Ideal Body Weight:  78.2 kg  BMI:  Body mass index is 24.27 kg/m.  Estimated Nutritional Needs:   Kcal:  1700-1900  Protein:  95-110 grams  Fluid:  1.7-1.9 L  EDUCATION NEEDS:   Education needs addressed  Drue Harr A. Jimmye Norman, RD, LDN, CDE Pager: [phone] After hours Pager: [phone]

## 2016-10-19 NOTE — Progress Notes (Signed)
ANTICOAGULATION CONSULT NOTE - [place] for Heparin  Indication:  Afib, with recent GI bleed  Allergies  Allergen Reactions  . Ace Inhibitors Cough    Patient Measurements: Height: 5\' 11"  (180.3 cm) Weight: 174 lb (78.9 kg) IBW/kg (Calculated) : 75.3  Vital Signs: Temp: 97.7 F (36.5 C) (06/14 0533) Temp Source: Oral (06/13 2218) BP: 119/82 (06/14 0533) Pulse Rate: 105 (06/14 0533)  Labs:  Recent Labs  10/17/16 0343 10/18/16 0411 10/18/16 0724 10/18/16 1855 10/19/16 0354  HGB 8.6* 8.6*  --   --  8.2*  HCT 27.7* 27.9*  --   --  26.5*  PLT 116* 130*  --   --  142*  LABPROT  --  15.8*  --   --   --   INR  --  1.25  --   --   --   HEPARINUNFRC  --   --  0.24* 0.21* 0.17*  CREATININE  --   --   --   --  1.87*    Estimated Creatinine Clearance: 37.5 mL/min (A) (by C-G formula based on SCr of 1.87 mg/dL (H)).  Assessment: 76 year old male on warfarin prior to admission admitted for GI bleed.  INR reversed with Kcentra on admission with plans to hold anti-coagulation for 7 days from 6/6.  Initial plan was to start Warfarin today, however now on heparin pending surgical eval for AAA.    Heparin level remains subtherapeutic on 1550 units/hr.  CBC stable. No issues with line or bleeding reported per RN.  Goal of Therapy:  Heparin level 0.3-0.5 units/ml (due to recent bleed) Monitor platelets by anticoagulation protocol: Yes   Plan:  Increase heparin infusion to 1800 units / hr Heparin level in 8 hours Daily heparin level / CBC Follow-up plan to restart Warfarin  Sherlon Handing, PharmD, BCPS Clinical pharmacist, pager [phone] 10/19/2016 5:58 AM

## 2016-10-19 NOTE — Consult Note (Signed)
[facility] and [facility]in Specialist of [place]  Patient name: Mark [name] MRN: [id number] DOB: 08-24-1940 Sex: male   REQUESTING PROVIDER:    Dr. Thereasa Solo   REASON FOR CONSULT:    AAA  HISTORY OF PRESENT ILLNESS:   Mark [name] is a 76 y.o. male, who is well known to me having undergone EVAR for a 9.5 cm AAA in 2011.  His AAA decreased in size to approximately 7.5 cm, and then he stopped following up.  He has a CT scan in 2015 that showed an increase in size to 9.5.  No definitive endoleak was found.  I took him for angiography and found a possible type II leak from the IMA and lumbar artery off of the left hypogastric artery.  He was seen by Dr. Kathlene Cote and had a repeat angiogram which did not demonstrate a endoleak.  When I saw him in 2017, the sac measured 8.7 cm.  No leak was seen.  I have not sen him since.  He was admitted with tachycardia and a GIB.  Endoscopy revealed duodenal ulcers.  He was started on a PPI.  He had a bowel movement today without gross blood.  He has a history of AFIB on coumadin, which has been stopped and he is now on a heparin drip. He is s/p nephrectomy for renal cell.  He has a baseline creatinine of 1.8.  He is s/p PCI for CAD.  He takes a statin for hypercholesterolemia.  HE has COPD and is a former smoker.  PAST MEDICAL HISTORY    Past Medical History:  Diagnosis Date  . AAA (abdominal aortic aneurysm) (HCC)    9.1 cm repair 05/2009 ( ENDOVASCULAR REPAIR ).  MORE RECENT RE-EVALUATION OF ENLARGING AAA BY DR. Trula Slade AND BY DR. Kathlene Cote  MAY 2015 - PT STATES HE UNDERSTANDS THAT NOTHING IS LEAKING AT PRESENT TIME AND HE IS TO FOLLOW UP WITH HIS DOCTORS FOR FUTURE MONITORING I  . Arthritis    LEFT HAND, RT KNEE (08/14/2016)  . Basal cell carcinoma    "right side of my nose; think they burned it off" (08/14/2016)  . CAD (coronary artery disease)    a. s/p PCI to LAD/diagonal, subtotal of PDA 2006. b. Negative nuc 2014.  Marland Kitchen  CHF (congestive heart failure) ([place]) 08/14/2016  . Chronic atrial fibrillation ([place])   . CKD (chronic kidney disease), stage III    a. stage III-IV, Cr baseline appears 1.8-2.0  . COPD (chronic obstructive pulmonary disease) ([place])   . Depression   . GERD (gastroesophageal reflux disease)   . History of kidney stones   . History of pneumothorax    LONG TIME AGO  . Hyperlipidemia   . Hypertension   . Migraine    "very rare the last 20 years" (08/14/2016)  . Myocardial infarction ([place]) 2006  . Osteoarthritis   . Renal cell carcinoma ([place])   . Shingles outbreak    PT NOTICED SKIN RASH RT GROIN AND RIGHT TRUNK ON MONDAY 10/27/13 - NOT A LOT OF DISCOMFORT- NOT ON ANY ORAL MEDS TO TREAT.  . Sleep apnea    "dx'd; didn't try mask" (08/14/2016)  . Stroke [facility])    "CT showed I've had old strokes; never knew about it before today" (08/14/2016)     FAMILY HISTORY   Family History  Problem Relation Age of Onset  . Pulmonary embolism Mother        died of PE  . Heart disease Mother  before age 21  . Anuerysm Father        history of popliteal  . Coronary artery disease Unknown   . Hyperlipidemia Unknown   . Hypertension Unknown   . Prostate cancer Unknown   . Other Neg Hx        polycystic kidney disease, and colon cancer  . Colon cancer Neg Hx     SOCIAL HISTORY:   Social History   Social History  . Marital status: Divorced    Spouse name: N/A  . Number of children: 2  . Years of education: N/A   Occupational History  .  Retired   Social History Main Topics  . Smoking status: Former Smoker    Packs/day: 1.50    Years: 23.00    Types: Cigarettes    Quit date: 08/24/1980  . Smokeless tobacco: Never Used  . Alcohol use 1.5 oz/week    3 Standard drinks or equivalent per week     Comment: 08/14/2016 "maybe 2-3 drinks/month average"  . Drug use: No  . Sexual activity: Not on file   Other Topics Concern  . Not on file   Social History Narrative   Retired   Divorced     2 sons 20 &30     Occupation:  Part time sports broadcaster    Smoking Status:  quit (08/24/1980)   Packs/Day:  1.0    Caffeine use/day:  3 beverages daily    Does Patient Exercise:  no   Alcohol Use - yes    ALLERGIES:    Allergies  Allergen Reactions  . Ace Inhibitors Cough    CURRENT MEDICATIONS:    Current Facility-Administered Medications  Medication Dose Route Frequency Provider Last Rate Last Dose  . acetaminophen (TYLENOL) tablet 650 mg  650 mg Oral Q6H PRN Cherene Altes, MD      . atorvastatin (LIPITOR) tablet 40 mg  40 mg Oral Daily Reyne Dumas, MD   40 mg at 10/19/16 0958  . carvedilol (COREG) tablet 12.5 mg  12.5 mg Oral BID WC Joette Catching T, MD   12.5 mg at 10/19/16 0759  . furosemide (LASIX) injection 40 mg  40 mg Intravenous BID Vernell Leep D, MD   40 mg at 10/19/16 0958  . heparin ADULT infusion 100 units/mL (25000 units/251mL sodium chloride 0.45%)  1,600 Units/hr Intravenous Continuous Bajbus, Lauren D, RPH      . isosorbide dinitrate (ISORDIL) tablet 10 mg  10 mg Oral TID Cherene Altes, MD   10 mg at 10/19/16 0958  . levalbuterol (XOPENEX) nebulizer solution 0.63 mg  0.63 mg Nebulization BID Modena Jansky, MD   0.63 mg at 10/19/16 0724  . ondansetron (ZOFRAN) tablet 4 mg  4 mg Oral Q6H PRN Reyne Dumas, MD       Or  . ondansetron (ZOFRAN) injection 4 mg  4 mg Intravenous Q6H PRN Reyne Dumas, MD      . pantoprazole (PROTONIX) EC tablet 40 mg  40 mg Oral BID Vena Rua, PA-C   40 mg at 10/19/16 4098  . polyethylene glycol (MIRALAX / GLYCOLAX) packet 17 g  17 g Oral Daily Allie Bossier, MD   17 g at 10/18/16 0913  . sertraline (ZOLOFT) tablet 100 mg  100 mg Oral QPM Reyne Dumas, MD   100 mg at 10/18/16 1829  . sodium chloride flush (NS) 0.9 % injection 3 mL  3 mL Intravenous Q12H Reyne Dumas, MD   3 mL at  10/19/16 0959  . tamsulosin (FLOMAX) capsule 0.4 mg  0.4 mg Oral QPC breakfast Reyne Dumas, MD   0.4 mg at 10/19/16  0800    REVIEW OF SYSTEMS:   [X]  denotes positive finding, [ ]  denotes negative finding Cardiac  Comments:  Chest pain or chest pressure:    Shortness of breath upon exertion:    Short of breath when lying flat:    Irregular heart rhythm: x       Vascular    Pain in calf, thigh, or hip brought on by ambulation:    Pain in feet at night that wakes you up from your sleep:     Blood clot in your veins:    Leg swelling:         Pulmonary    Oxygen at home:    Productive cough:     Wheezing:         Neurologic    Sudden weakness in arms or legs:     Sudden numbness in arms or legs:     Sudden onset of difficulty speaking or slurred speech:    Temporary loss of vision in one eye:     Problems with dizziness:         Gastrointestinal    Blood in stool:  x    Vomited blood:         Genitourinary    Burning when urinating:     Blood in urine:        Psychiatric    Major depression:         Hematologic    Bleeding problems:    Problems with blood clotting too easily:        Skin    Rashes or ulcers:        Constitutional    Fever or chills:     PHYSICAL EXAM:   Vitals:   10/19/16 0209 10/19/16 0533 10/19/16 0725 10/19/16 1407  BP:  119/82  97/66  Pulse:  (!) 105  97  Resp:  20  20  Temp:  97.7 F (36.5 C)  97.3 F (36.3 C)  TempSrc:      SpO2:  96% (!) 6% 93%  Weight: 174 lb (78.9 kg)     Height:        GENERAL: The patient is a well-nourished male, in no acute distress. The vital signs are documented above. CARDIAC: There is a regular rate and rhythm.  PULMONARY: Nonlabored respirations ABDOMEN: Soft and non-tender.  No pulsatile mass  MUSCULOSKELETAL: There are no major deformities or cyanosis. NEUROLOGIC: No focal weakness or paresthesias are detected. SKIN: There are no ulcers or rashes noted. PSYCHIATRIC: The patient has a normal affect.  STUDIES:   I have reviewed his MRI with the following findings: Re- demonstration of surgically treated  infrarenal abdominal aortic aneurysm with stent graft. Greatest diameter of the excluded aneurysm sac is relatively unchanged compared to most recent CT studies, approximately 9.1 cm.  Bilateral pleural effusions.  Surgical changes of prior left nephrectomy  ASSESSMENT and PLAN   AAA:  His AAA has remained essentially stable over time.  No intervention is recommended currently.  I will have him see me back in the office in 6 months for a repeat u/s.  I do not think he has a aorto-enteric fistula.  There is no stranding on his MRI around his graft.  EGD revealed ulcers as the source of his bleed.   Annamarie Major, MD Vascular and Vein  Specialists of [facility] [phone] Pager [phone]

## 2016-10-19 NOTE — Care Management Note (Signed)
Case Management Note  Patient Details  Name: KAIREN HALLINAN MRN: [id number] Date of Birth: 1941/02/06  Subjective/Objective:     GIB, Acute on chronic resp failure, CHF               Action/Plan: Discharge Planning: please see previous NCM notes.   Lives alone, recent admission with dc on 10/07/2016. Was active with [facility] and has cane and oxygen at home. Scheduled dc to SNF. CSW following for SNF placement.   PCP [name] MD  Expected Discharge Date:                 Expected Discharge Plan:  [place]  In-House Referral:  Clinical Social Work  Discharge planning Services  CM Consult  Post Acute Care Choice:  Resumption of Svcs/PTA Provider, Home Health Choice offered to:  Patient   HH Arranged:  RN, PT, Social Work [employer] Agency:  [place]  Status of Service:  Completed, signed off  If discussed at [employer], dates discussed:    Additional Comments:  Erenest Rasher, RN 10/19/2016, 9:25 AM

## 2016-10-20 ENCOUNTER — Encounter (HOSPITAL_COMMUNITY): Payer: Self-pay | Admitting: Physician Assistant

## 2016-10-20 ENCOUNTER — Telehealth: Payer: Self-pay | Admitting: Surgery

## 2016-10-20 DIAGNOSIS — I10 Essential (primary) hypertension: Secondary | ICD-10-CM

## 2016-10-20 LAB — CBC
HCT: 26.1 % — ABNORMAL LOW (ref 39.0–52.0)
Hemoglobin: 8 g/dL — ABNORMAL LOW (ref 13.0–17.0)
MCH: 28.9 pg (ref 26.0–34.0)
MCHC: 30.7 g/dL (ref 30.0–36.0)
MCV: 94.2 fL (ref 78.0–100.0)
PLATELETS: 155 10*3/uL (ref 150–400)
RBC: 2.77 MIL/uL — ABNORMAL LOW (ref 4.22–5.81)
RDW: 18.3 % — AB (ref 11.5–15.5)
WBC: 4.3 10*3/uL (ref 4.0–10.5)

## 2016-10-20 LAB — BASIC METABOLIC PANEL
Anion gap: 5 (ref 5–15)
BUN: 26 mg/dL — AB (ref 6–20)
CHLORIDE: 108 mmol/L (ref 101–111)
CO2: 26 mmol/L (ref 22–32)
CREATININE: 2.03 mg/dL — AB (ref 0.61–1.24)
Calcium: 8.2 mg/dL — ABNORMAL LOW (ref 8.9–10.3)
GFR calc Af Amer: 36 mL/min — ABNORMAL LOW (ref >60)
GFR, EST NON AFRICAN AMERICAN: 31 mL/min — AB (ref >60)
Glucose, Bld: 88 mg/dL (ref 65–99)
Potassium: 3.3 mmol/L — ABNORMAL LOW (ref 3.5–5.1)
Sodium: 139 mmol/L (ref 135–145)

## 2016-10-20 LAB — PROTIME-INR
INR: 1.28
PROTHROMBIN TIME: 16.1 s — AB (ref 11.4–15.2)

## 2016-10-20 LAB — HEPARIN LEVEL (UNFRACTIONATED)
HEPARIN UNFRACTIONATED: 0.65 [IU]/mL (ref 0.30–0.70)
Heparin Unfractionated: 0.67 IU/mL (ref 0.30–0.70)
Heparin Unfractionated: 0.55 IU/mL (ref 0.30–0.70)

## 2016-10-20 LAB — PROCALCITONIN: Procalcitonin: <0.1 ng/mL

## 2016-10-20 LAB — MAGNESIUM: Magnesium: 1.8 mg/dL (ref 1.7–2.4)

## 2016-10-20 MED ORDER — POTASSIUM CHLORIDE CRYS ER 20 MEQ PO TBCR
20.0000 meq | EXTENDED_RELEASE_TABLET | Freq: Once | ORAL | Status: AC
  Administered 2016-10-20: 20 meq via ORAL
  Filled 2016-10-20: qty 1

## 2016-10-20 MED ORDER — POTASSIUM CHLORIDE CRYS ER 20 MEQ PO TBCR
30.0000 meq | EXTENDED_RELEASE_TABLET | ORAL | Status: AC
  Administered 2016-10-20 (×2): 30 meq via ORAL
  Filled 2016-10-20 (×2): qty 1

## 2016-10-20 MED ORDER — WARFARIN SODIUM 7.5 MG PO TABS
7.5000 mg | ORAL_TABLET | Freq: Once | ORAL | Status: AC
  Administered 2016-10-20: 7.5 mg via ORAL
  Filled 2016-10-20: qty 1

## 2016-10-20 MED ORDER — FUROSEMIDE 10 MG/ML IJ SOLN: 40.0000 mg | Freq: Two times a day (BID) | INTRAMUSCULAR | Status: DC

## 2016-10-20 MED ORDER — LEVALBUTEROL HCL 0.63 MG/3ML IN NEBU: 0.6300 mg | INHALATION_SOLUTION | Freq: Four times a day (QID) | RESPIRATORY_TRACT | Status: DC | PRN

## 2016-10-20 MED ORDER — MAGNESIUM SULFATE IN D5W 1-5 GM/100ML-% IV SOLN
1.0000 g | Freq: Once | INTRAVENOUS | Status: AC
  Administered 2016-10-20: 1 g via INTRAVENOUS
  Filled 2016-10-20: qty 100

## 2016-10-20 MED ORDER — POTASSIUM CHLORIDE CRYS ER 20 MEQ PO TBCR: 20.0000 meq | EXTENDED_RELEASE_TABLET | Freq: Once | ORAL | Status: DC

## 2016-10-20 MED ORDER — FUROSEMIDE 10 MG/ML IJ SOLN
40.0000 mg | Freq: Three times a day (TID) | INTRAMUSCULAR | Status: DC
  Administered 2016-10-21 – 2016-10-25 (×16): 40 mg via INTRAVENOUS
  Filled 2016-10-20 (×17): qty 4

## 2016-10-20 MED ORDER — FUROSEMIDE 10 MG/ML IJ SOLN
80.0000 mg | Freq: Once | INTRAMUSCULAR | Status: AC
  Administered 2016-10-20: 80 mg via INTRAVENOUS
  Filled 2016-10-20: qty 8

## 2016-10-20 NOTE — Progress Notes (Signed)
PROGRESS NOTE   Mark [name]  AOZ:[id number]    DOB: 1940-10-30    DOA: 10/11/2016  PCP: [name], NP   I have briefly reviewed patients previous medical records in [facility].  Brief Narrative:  76 year old male with history of AAA, atrial fibrillation on Coumadin anticoagulation, CAD, COPD, GERD, renal cell carcinoma status post nephrectomy, HTN and dementia presented with tachycardia and Mal[place]. Patient was admitted and discharged on 10/07/16 for confusion with slurred speech, at which time an MRI of the brain was negative for acute CVA. Patient was seen by his PCP 6/6  for hospital follow-up, and noted to have a heart rate in the 150s. He was referred to [facility] where he also reported 2-3 days of melena. In the ED, his heart rate was 140, guaiac positive, INR 2.3 and hemoglobin 8.3. Status post EGD. Hemoglobin stable over the last 4 days. Treating for decompensated CHF. Despite ongoing Rx for CHF, diuresis, reports DOE. Cardiology Unsalted for assistance on 6/15.   Assessment & Plan:   Principal Problem:   GI bleed Active Problems:   Hyperlipidemia   Depression   Essential hypertension   Coronary atherosclerosis   Abdominal aortic aneurysm (HCC)   Atrial fibrillation, chronic (HCC)   Asthma   Acute on chronic respiratory failure with hypoxia (HCC)   Acute systolic CHF (congestive heart failure) ([place])   1. Acute upper GI bleed, secondary to multiple duodenal ulcers: Status post EGD. H pylori screen negative. Continue twice a day PPI. Coumadin on hold but tolerating IV heparin without recurrent GI bleed. Clinically resolved. Discussed with Dr.Brabham, VVS on 6/14 and input appreciated >do not think he has a aorto enteric fistula. 2. Acute blood loss anemia: Hemoglobin dropped from 11.3-8.3 in 4 days. Transfused 3 units PRBCs thus far. Given history of CAD, keep hemoglobin at 8 g or higher. Hemoglobin stable in the last several days. Continue to  monitor. 3. Acute on chronic systolic CHF: Patient reports 4 week history of progressively worsening dyspnea on exertion. Had been on home dose of Lasix 60 mg daily without significant improvement. Significantly volume overloaded on exam. 2-D echo 08/15/16: LVEF 20-25 percent with diffuse hypokinesis. Changed IV Lasix to 40 mg every 12 hours and monitor closely. Continue nitrates. Sees Dr. Stanford Breed, cardiology. Continue IV diuresis. Discussed with nursing regarding strict intake output monitoring. Patient states that he is urinating well. Intake output probably not accurate. Cardiology consulted on 6/15 to assist with management and re NSVT. 4. Acute on chronic hypoxic respiratory failure: Patient on home oxygen 3 L/m continuously. Worsening dyspnea likely secondary to decompensated CHF with some contribution from anemia. Low index of suspicion for pneumonia. Pro-calcitonin <0.1. Also bilateral atelectasis and small pleural effusions on chest x-ray. Incentive spirometry. Monitor. 5. Stage III chronic kidney disease: Baseline creatinine 1.8. Relatively stable. Monitor closely while on IV Lasix. Creatinine has bumped up to 2. 6. Permanent A. fib with RVR: Anticoagulation had been reversed on 10/11/16. Currently tolerating IV heparin. No interventions planned and hence resumed Coumadin 6/14. 7. AAA: Ultrasound of aorta without evidence of entero-aortic fistula. No BRBPR noted during this hospital stay. MRA abdomen 6/12: Redemonstration of surgically treated infrarenal AAA with stent graft. Greatest diameter of the excluded aneurysm sac is relatively unchanged compared to most recent CT studies, approximately 9.1 cm. discussed with vascular surgery and their input appreciated: His AAA has remained stable over time and no intervention is currently recommended. Follow-up with vascular surgery in 6 months to  with repeat ultrasound. Does not think that he has a aorto enteric fistula. There is no stranding on his MRI  around the graft. 8. Hypokalemia: Replace aggressively and follow. Magnesium 1.9. 9. CAD status post PCI: Asymptomatic of chest pain. 10. Dementia: Advanced chronic small vessel disease noted on MRI 10/06/16 consistent with vascular dementia. Family reported progressive cognitive decline and feels that he is no longer safe to live on his own. PT/OT suggest SNF placement and patient agreeable. DC to SNF when medically stable. 11. Hyperlipidemia: Statins. 12. Essential hypertension: Controlled. 13. BPH: Continue Flomax. 14. Depression: Stable. Continue Zoloft. 15. Renal cell carcinoma, status post left nephrectomy. 16. COPD: Stable without clinical bronchospasm. 17. Thrombocytopenia, chronic: ? Secondary to acute blood loss. Resolved. 18. NSVT: 13 beat NSVT noted on 6/13 at 8:41 AM. Does not seem like A. fib with aberrancy. Possibly due to his cardiomyopathy. Continue monitoring on telemetry. Replace potassium and magnesium close to 4 and 2 respectively. May consider increasing carvedilol dose but soft blood pressures limiting factor. No further episodes.   DVT prophylaxis: On IV heparin drip. Code Status: Full Family Communication: None at bedside Disposition: DC to SNF when medically stable   Consultants:  [facility]  Procedures:  EGD  Antimicrobials:  None    Subjective: Feels that his breathing is better but not close to his baseline. No chest pain reported. Normal colored stools.   ROS: Uses 3 L/m oxygen at home. Does not use CPAP. No dizziness or lightheadedness reported.  Objective:  Vitals:   10/19/16 2154 10/20/16 0500 10/20/16 0513 10/20/16 0913  BP: 118/74  119/78 118/70  Pulse: (!) 106  99   Resp: 19  18   Temp: 98.1 F (36.7 C)  98.2 F (36.8 C)   TempSrc: Oral  Oral   SpO2: 97%  97%   Weight:  100.7 kg (222 lb)    Height:        Examination:  General exam: Pleasant elderly male sitting up in bed. Respiratory system: Slightly diminished  breath sounds in the bases but better. Few fine basal crackles. Rest of lung fields clear to auscultation. No increased work of breathing. Cardiovascular system: S1 & S2 heard, irregularly irregular. No JVD, murmurs, rubs, gallops or clicks. 1+ pitting bilateral leg edema extending up to thighs and sacrum-slightly better but still present. Telemetry: A. fib with mostly controlled ventricular rate in the 90s. Occasional mild tachycardia in the 110s. No further episodes of NSVT. Gastrointestinal system: Abdomen is nondistended, soft and nontender. No organomegaly or masses felt. Normal bowel sounds heard. No change/stable Central nervous system: Alert and oriented 2. No focal neurological deficits. No change/stable Extremities: Symmetric 5 x 5 power. Skin: No rashes, lesions or ulcers Psychiatry: Judgement and insight appears slightly impaired. Mood & affect appropriate.     Data Reviewed: I have personally reviewed following labs and imaging studies  CBC:  Recent Labs Lab 10/16/16 0347 10/17/16 0343 10/18/16 0411 10/19/16 0354 10/20/16 0540  WBC 4.4 4.7 4.5 4.4 4.3  HGB 8.5* 8.6* 8.6* 8.2* 8.0*  HCT 27.2* 27.7* 27.9* 26.5* 26.1*  MCV 92.8 93.0 93.0 93.3 94.2  PLT 99* 116* 130* 142* 106   Basic Metabolic Panel:  Recent Labs Lab 10/14/16 0425 10/15/16 0531 10/19/16 0354 10/19/16 1648 10/20/16 0540  NA 139 140 139 140 139  K 3.3* 3.8 2.8* 3.4* 3.3*  CL 111 110 107 109 108  CO2 23 22 24 22 26   GLUCOSE 99 118* 82 81 88  BUN 44* 36* 29* 26* 26*  CREATININE 1.89* 2.00* 1.87* 2.06* 2.03*  CALCIUM 8.2* 8.4* 7.9* 7.8* 8.2*  MG 1.9 1.9  --  1.6*  --    Liver Function Tests:  Recent Labs Lab 10/15/16 0531  AST 21  ALT 17  ALKPHOS 61  BILITOT 2.0*  PROT 5.1*  ALBUMIN 2.6*   Coagulation Profile:  Recent Labs Lab 10/18/16 0411 10/20/16 0540  INR 1.25 1.28   Cardiac Enzymes: No results for input(s): CKTOTAL, CKMB, CKMBINDEX, TROPONINI in the last 168  hours.   Recent Results (from the past 240 hour(s))  MRSA PCR Screening     Status: None   Collection Time: 10/11/16  5:55 PM  Result Value Ref Range Status   MRSA by PCR NEGATIVE NEGATIVE Final    Comment:        The GeneXpert MRSA Assay (FDA approved for NASAL specimens only), is one component of a comprehensive MRSA colonization surveillance program. It is not intended to diagnose MRSA infection nor to guide or monitor treatment for MRSA infections.          Radiology Studies: No results found.      Scheduled Meds: . atorvastatin  40 mg Oral Daily  . carvedilol  12.5 mg Oral BID WC  . feeding supplement (ENSURE ENLIVE)  237 mL Oral BID BM  . furosemide  40 mg Intravenous BID  . isosorbide dinitrate  10 mg Oral TID  . pantoprazole  40 mg Oral BID  . polyethylene glycol  17 g Oral Daily  . sertraline  100 mg Oral QPM  . sodium chloride flush  3 mL Intravenous Q12H  . tamsulosin  0.4 mg Oral QPC breakfast  . warfarin  7.5 mg Oral ONCE-1800  . Warfarin - Pharmacist Dosing Inpatient   Does not apply q1800   Continuous Infusions: . heparin 1,350 Units/hr (10/20/16 1045)     LOS: 9 days     Golden Emile, MD, FACP, FHM. [facility] Pager [phone]  If 7PM-7AM, please contact night-coverage [email or web address] Password [facility] 10/20/2016, 12:23 PM ]

## 2016-10-20 NOTE — Telephone Encounter (Signed)
Sched appt 04/16/17; lab at 8:30 and MD at 9:15. Mailed appt letter.

## 2016-10-20 NOTE — Progress Notes (Addendum)
Reviewed decreasing Hgb with IM - Dr. Algis Liming aware. Patient has not had any further melenotic stools or bleeding. Platelet count improving. Some question as to whether or not decreasing Hgb is dilution versus due to frequent blood draws. At this time per our discussion, plan is to follow CBCs daily with close eye given anticoagulation. With regard to Mg, he feels comfortable giving patient 1g this evening. (Received 2g for Mg level 1.6 yesterday with improvement to 1.8, but still would like 2.0 or greater given NSVT.) Will order and recheck Mg in AM.   Regarding potassium - K this AM was 3.3. Following trend, this was 2.8 -> s/p 2 doses of 70meq -> 3.4 -> s/p 1 dose of 53meq -> 3.3. He received 2 doses of 14meq KCl earlier today. Will give an additional 39meq this PM since we are giving higher dose of Lasix. F/u BMET in AM already ordered.   Dayna Dunn PA-C

## 2016-10-20 NOTE — Progress Notes (Signed)
Patient ID: Mark [name], male   DOB: March 24, 1941, 76 y.o.   MRN: [id number] Sitting up eating lunch. No abdominal pain. No further GI bleed. Follow-up with Dr. Trula Slade in the office as scheduled

## 2016-10-20 NOTE — Progress Notes (Signed)
ANTICOAGULATION CONSULT NOTE - [place] for Heparin  Indication:  Afib, with recent GI bleed  Allergies  Allergen Reactions  . Ace Inhibitors Cough    Patient Measurements: Height: 5\' 11"  (180.3 cm) Weight: 174 lb (78.9 kg) IBW/kg (Calculated) : 75.3  Vital Signs: Temp: 98.1 F (36.7 C) (06/14 2154) Temp Source: Oral (06/14 2154) BP: 118/74 (06/14 2154) Pulse Rate: 106 (06/14 2154)  Labs:  Recent Labs  10/17/16 0343 10/18/16 0411  10/19/16 0354 10/19/16 1319 10/19/16 1648 10/20/16 0020  HGB 8.6* 8.6*  --  8.2*  --   --   --   HCT 27.7* 27.9*  --  26.5*  --   --   --   PLT 116* 130*  --  142*  --   --   --   LABPROT  --  15.8*  --   --   --   --   --   INR  --  1.25  --   --   --   --   --   HEPARINUNFRC  --   --   < > 0.17* 1.04*  --  0.65  CREATININE  --   --   --  1.87*  --  2.06*  --   < > = values in this interval not displayed.  Estimated Creatinine Clearance: 34 mL/min (A) (by C-G formula based on SCr of 2.06 mg/dL (H)).   Assessment: 76 year old male on warfarin prior to admission admitted for GI bleed.  INR reversed with Kcentra on admission with plans to hold anti-coagulation for 7 days from 6/6.  Warfarin restarted 6/14 and pt continues on heparin bridge.  Heparin level remains supratherapeutic (0.65) for lower goal range on gtt at 1600 units/hr. No bleeding noted.  Goal of Therapy:  Heparin level 0.3-0.5 units/ml (due to recent bleed) Monitor platelets by anticoagulation protocol: Yes   Plan:  Decrease heparin infusion to 1500 units/hr F/u 8 hr heparin level  Sherlon Handing, PharmD, BCPS Clinical pharmacist, pager [phone] 10/20/2016 12:51 AM

## 2016-10-20 NOTE — Progress Notes (Signed)
ANTICOAGULATION CONSULT NOTE - [place] for Heparin + warfarin Indication:  Afib, with recent GI bleed  Allergies  Allergen Reactions  . Ace Inhibitors Cough    Patient Measurements: Height: 5\' 11"  (180.3 cm) Weight: 222 lb (100.7 kg) IBW/kg (Calculated) : 75.3  Vital Signs: Temp: 98.2 F (36.8 C) (06/15 0513) Temp Source: Oral (06/15 0513) BP: 118/70 (06/15 0913) Pulse Rate: 99 (06/15 0513)  Labs:  Recent Labs  10/18/16 0411  10/19/16 0354 10/19/16 1319 10/19/16 1648 10/20/16 0020 10/20/16 0540 10/20/16 0826  HGB 8.6*  --  8.2*  --   --   --  8.0*  --   HCT 27.9*  --  26.5*  --   --   --  26.1*  --   PLT 130*  --  142*  --   --   --  155  --   LABPROT 15.8*  --   --   --   --   --  16.1*  --   INR 1.25  --   --   --   --   --  1.28  --   HEPARINUNFRC  --   < > 0.17* 1.04*  --  0.65  --  0.67  CREATININE  --   --  1.87*  --  2.06*  --  2.03*  --   < > = values in this interval not displayed.  Estimated Creatinine Clearance: 39.2 mL/min (A) (by C-G formula based on SCr of 2.03 mg/dL (H)).   Assessment: 76 year old male on warfarin prior to admission admitted for GI bleed.  INR reversed with Kcentra on admission with plans to hold anti-coagulation for 7 days from 6/6.     Vascular surgery has seen, and will not intervene on AAA during this admission. Warfarin was restarted yesterday.  It took a few rate increases for heparin level to increase- has now been supratherapeutic x3. Most recent level 0.67units/mL on 1500 units/hr. INR 1.28 this morning. Home dose of warfarin 5mg  daily except 7.5mg  on Wednesdays.   Hgb 8, plts 155- no bleeding noted.  Goal of Therapy:  Heparin level 0.3-0.5 units/ml (due to recent bleed) INR 2-3 Monitor platelets by anticoagulation protocol: Yes   Plan:  -Decrease heparin to 1350 units/hr -8h heparin level at 1900 -Warfarin 7.5mg  po x1 tonight   -Heparin level, INR, and CBC daily  Kayce Betty D. Jazmon Kos,  PharmD, BCPS Clinical Pharmacist Pager: [phone] Clinical Phone for 10/20/2016 until 3:30pm: [phone] If after 3:30pm, please call main pharmacy at [phone] 10/20/2016 10:43 AM

## 2016-10-20 NOTE — Consult Note (Signed)
Cardiology Consultation:   Patient ID: Mark [name]; [id number]; [birth year]   Admit date: 10/11/2016 Date of Consult: 10/20/2016  Primary Care Provider: [name], NP Primary Cardiologist: Dr. Stanford [name]  Patient Profile:   Mark [name] is a 76 y.o. male with a hx of CAD (s/p PCI to LAD/diagonal, subtotal of PDA 2006), permanent atrial fibrillation (pt preferred Coumadin), AAA repair 2011 (with enlarged aneurysm sac, followed by VVS), renal cell carcinoma s/p prior nephrectomy, CKD stage III-IV (baseline appears 8.1-2.7), chronic systolic CHF as of 09/1698 with new LBBB, probable dementia, HLD, COPD chronic respiratory failure, former tobacco abuse, GERD, HTN, sleep apnea, occult strokes on CT 08/2016 whom we are asked to see for CHF and NSVT. He was admitted with ABL anemia and GIB.  History of Present Illness:   To recap, he had prior PCI 2006 as above with nuc in 2014 showing normal EF and perfusion. 2D Echo in 10/2015 also showed normal EF. He's been followed by vascular for enlargement of aneurysm sac. In 08/2016 he was admitted for subacute encephalopathy with family noting bizarre behavior, disorientation, vivid dreams and hoarding for several weeks. He was also found to have new LBBB and EF 20%. Metabolic workup was generally unremarkable and this was felt related to possible undiagnosed dementia. He was seen by psychiatry. Rate control meds were increased. Ischemic eval was deferred inpatient pending improvement in mental status. He saw Dr. Stanford [name] back in clinic 09/20/16 who recommended OP Holter for rate control assessment and nuclear stress test. The patient did not show up for either of these tests. He was readmitted 6/1-10/07/16 with subacute delirium, found sleeping outside his apartment, hypoxic, AKI on CKD. He had not eaten or drank in several days. MRI was nonacute and symptoms improved with hydration.   He was seen back in follow-up 10/11/16 at which time Ms. Conley Canal NP  noted concern from his neighbor calling to tell her that the patient's home was "3 feet deep of trash and smelled terrible." The patient reported increased dyspnea and was found to be in rapid atrial fib and hypotensive. He also reported several episodes of bloody stools and melena. He has subsequently been admitted for acute blood loss anemia with upper GIB. He required txfusion. Endoscopy showed multiple nonbleeding duodenal ulcers, Schatzki ring, small HH. He's been treated with rate control and diuretics following blood transfusion. He was seen by vascular this admission due to concern for patient reported BRBPR at home - per Dr. Trula Slade, he did not think the patient had an aorto-enteric fistula. F/u OP monitoring recommended. He is back on heparin->Coumadin. Most recent labs have shown K 3.3, Cr 2.03, Hgb 8.0, plt as low as 80s but since recovered, albumin 2.6, troponins 0.12-0.16-0.70.  He notes overall feeling much better but still more SOB than baseline. Also has 1+ BLE edema. Weights in Epic extremity inconsistent ([phone]). OP weight 220. BP stable, HR upper 90s currently. On 10/18/16 had 13 beat run NSVT, no specific sx that patient can recall. Primary team repleting K. He denies CP. He seems to have an overall grasp on recent events and why he's in the hospital but does confabulate quite a bit to get to the end answer.  Past Medical History:  Diagnosis Date  . AAA (abdominal aortic aneurysm) (HCC)    9.1 cm repair 05/2009 ( ENDOVASCULAR REPAIR ).  MORE RECENT RE-EVALUATION OF ENLARGING AAA BY DR. Trula Slade AND BY DR. Kathlene Cote  MAY 2015 - PT STATES HE UNDERSTANDS THAT NOTHING  IS LEAKING AT PRESENT TIME AND HE IS TO FOLLOW UP WITH HIS DOCTORS FOR FUTURE MONITORING I  . Arthritis    LEFT HAND, RT KNEE (08/14/2016)  . Basal cell carcinoma    "right side of my nose; think they burned it off" (08/14/2016)  . CAD (coronary artery disease)    a. s/p PCI to LAD/diagonal, subtotal of PDA  2006. b. Negative nuc 2014.  Marland Kitchen Chronic atrial fibrillation ([place])   . Chronic systolic CHF (congestive heart failure) (HCC)    a. EF 20-25% dx in 08/2016; etiology not yet clear.  . CKD (chronic kidney disease), stage III    a. stage III-IV, Cr baseline appears 1.8-2.0  . COPD (chronic obstructive pulmonary disease) ([place])   . Depression   . GERD (gastroesophageal reflux disease)   . History of kidney stones   . History of pneumothorax    LONG TIME AGO  . Hyperlipidemia   . Hypertension   . Migraine    "very rare the last 20 years" (08/14/2016)  . Myocardial infarction ([place]) 2006  . Osteoarthritis   . Renal cell carcinoma ([place])   . Shingles outbreak    PT NOTICED SKIN RASH RT GROIN AND RIGHT TRUNK ON MONDAY 10/27/13 - NOT A LOT OF DISCOMFORT- NOT ON ANY ORAL MEDS TO TREAT.  . Sleep apnea    "dx'd; didn't try mask" (08/14/2016)  . Stroke [facility])    "CT showed I've had old strokes; never knew about it before today" (08/14/2016)    Past Surgical History:  Procedure Laterality Date  . ABDOMINAL AORTAGRAM N/A 09/24/2013   Procedure: ABDOMINAL Maxcine Ham;  Surgeon: Serafina Mitchell, MD;  Location: [facility] CATH LAB;  Service: Cardiovascular;  Laterality: N/A;  . ABDOMINAL AORTIC ANEURYSM REPAIR  JAN 2011   ENDOVASCULAR - DR. BRABHAM  . CARDIAC CATHETERIZATION  07/04/2004   Archie Endo 07/04/2004  . COLONOSCOPY  latest 2004   Dr Sammuel Cooper.  reported in 11/11/2008 PMD note as normal   . CORONARY ANGIOPLASTY WITH STENT PLACEMENT  07/05/2004   notes 07/05/2004  . ESOPHAGOGASTRODUODENOSCOPY N/A 10/12/2016   Procedure: ESOPHAGOGASTRODUODENOSCOPY (EGD);  Surgeon: Doran Stabler, MD;  Location: [facility] ENDOSCOPY;  Service: Endoscopy;  Laterality: N/A;  bedside procedure in [facility] [place]  . KNEE ARTHROSCOPY Right   . LITHOTRIPSY    . ROBOT ASSISTED LAPAROSCOPIC NEPHRECTOMY Left 11/06/2013   Procedure: ROBOTIC ASSISTED LAPAROSCOPIC RADICAL  NEPHRECTOMY;  Surgeon: Alexis Frock, MD;  Location: [facility] ORS;  Service: Urology;   Laterality: Left;  . TONSILLECTOMY  1964     Inpatient Medications: Scheduled Meds: . atorvastatin  40 mg Oral Daily  . carvedilol  12.5 mg Oral BID WC  . feeding supplement (ENSURE ENLIVE)  237 mL Oral BID BM  . furosemide  40 mg Intravenous BID  . isosorbide dinitrate  10 mg Oral TID  . pantoprazole  40 mg Oral BID  . polyethylene glycol  17 g Oral Daily  . potassium chloride  30 mEq Oral Q4H  . sertraline  100 mg Oral QPM  . sodium chloride flush  3 mL Intravenous Q12H  . tamsulosin  0.4 mg Oral QPC breakfast  . warfarin  7.5 mg Oral ONCE-1800  . Warfarin - Pharmacist Dosing Inpatient   Does not apply q1800   Continuous Infusions: . heparin 1,350 Units/hr (10/20/16 1045)   PRN Meds: acetaminophen, levalbuterol, ondansetron **OR** ondansetron (ZOFRAN) IV  Allergies:    Allergies  Allergen Reactions  . Ace Inhibitors Cough  Social History:   Social History   Social History  . Marital status: Divorced    Spouse name: N/A  . Number of children: 2  . Years of education: N/A   Occupational History  .  Retired   Social History Main Topics  . Smoking status: Former Smoker    Packs/day: 1.50    Years: 23.00    Types: Cigarettes    Quit date: 08/24/1980  . Smokeless tobacco: Never Used  . Alcohol use 1.5 oz/week    3 Standard drinks or equivalent per week     Comment: 08/14/2016 "maybe 2-3 drinks/month average"  . Drug use: No  . Sexual activity: Not on file   Other Topics Concern  . Not on file   Social History Narrative   Retired   Divorced   2 sons 47 &30     Occupation:  Part time sports Tax adviser    Smoking Status:  quit (08/24/1980)   Packs/Day:  1.0    Caffeine use/day:  3 beverages daily    Does Patient Exercise:  no   Alcohol Use - yes    Family History:   The patient's family history includes Anuerysm in his father; Heart disease in his mother; Pulmonary embolism in his mother. There is no history of Other or Colon cancer.  ROS:  Please  see the history of present illness.  All other ROS reviewed and negative.     Physical Exam/Data:   Vitals:   10/20/16 0500 10/20/16 0513 10/20/16 0913 10/20/16 1435  BP:  119/78 118/70 126/75  Pulse:  99  61  Resp:  18  18  Temp:  98.2 F (36.8 C)  98.1 F (36.7 C)  TempSrc:  Oral  Oral  SpO2:  97%  100%  Weight: 222 lb (100.7 kg)     Height:        Intake/Output Summary (Last 24 hours) at 10/20/16 1606 Last data filed at 10/20/16 1549  Gross per 24 hour  Intake           941.27 ml  Output             1725 ml  Net          -783.73 ml   Filed Weights   10/19/16 0000 10/19/16 0209 10/20/16 0500  Weight: 174 lb 12.8 oz (79.3 kg) 174 lb (78.9 kg) 222 lb (100.7 kg)   Body mass index is 30.96 kg/m.  General: Well developed, well nourished, in no acute distress. Head: Normocephalic, atraumatic, sclera non-icteric, no xanthomas, nares are without discharge.  Neck: Negative for carotid bruits. JVD not elevated. Lungs: Clear bilaterally to auscultation without wheezes, rales, or rhonchi. Breathing is unlabored. Heart: RRR with S1 S2. No murmurs, rubs, or gallops appreciated. Abdomen: Soft, non-tender, non-distended with normoactive bowel sounds. No hepatomegaly. No rebound/guarding. No obvious abdominal masses. Msk:  Strength and tone appear normal for age. Extremities: No clubbing or cyanosis. No edema.  Distal pedal pulses are 2+ and equal bilaterally. Neuro: Alert and oriented X 3. No facial asymmetry. No focal deficit. Moves all extremities spontaneously. Psych:  Responds to questions appropriately with a normal affect.  EKG:  The EKG was personally reviewed and demonstrates atrial fib 142bpm LBBB  Relevant CV Studies: 2D Echo 08/2016 Study Conclusions  - Left ventricle: The cavity size was mildly dilated. Wall   thickness was increased in a pattern of mild LVH. There was   moderate focal basal hypertrophy of the septum. Systolic function  was severely reduced. The  estimated ejection fraction was in the   range of 20% to 25%. Diffuse hypokinesis. The study is not   technically sufficient to allow evaluation of LV diastolic   function. - Aortic valve: There was mild regurgitation. - Aortic root: The aortic root was mildly dilated. - Ascending aorta: The ascending aorta was mildly dilated. - Mitral valve: There was moderate regurgitation. - Left atrium: The atrium was severely dilated. - Right ventricle: The cavity size was mildly dilated. - Right atrium: The atrium was moderately dilated. Impressions: - Severe LV systolic dysfunction; mild AI; mildly dilated aortic   root (4.6 cm; suggest CTA or MRA to further assess); moderate MR;   biatrial enlargement; mild RVE; mild TR.  Laboratory Data:  Chemistry Recent Labs Lab 10/19/16 0354 10/19/16 1648 10/20/16 0540  NA 139 140 139  K 2.8* 3.4* 3.3*  CL 107 109 108  CO2 24 22 26   GLUCOSE 82 81 88  BUN 29* 26* 26*  CREATININE 1.87* 2.06* 2.03*  CALCIUM 7.9* 7.8* 8.2*  GFRNONAA 34* 30* 31*  GFRAA 39* 35* 36*  ANIONGAP 8 9 5      Recent Labs Lab 10/15/16 0531  PROT 5.1*  ALBUMIN 2.6*  AST 21  ALT 17  ALKPHOS 61  BILITOT 2.0*   Hematology Recent Labs Lab 10/18/16 0411 10/19/16 0354 10/20/16 0540  WBC 4.5 4.4 4.3  RBC 3.00* 2.84* 2.77*  HGB 8.6* 8.2* 8.0*  HCT 27.9* 26.5* 26.1*  MCV 93.0 93.3 94.2  MCH 28.7 28.9 28.9  MCHC 30.8 30.9 30.7  RDW 18.4* 18.3* 18.3*  PLT 130* 142* 155   Cardiac EnzymesNo results for input(s): TROPONINI in the last 168 hours. No results for input(s): TROPIPOC in the last 168 hours.  BNPNo results for input(s): BNP, PROBNP in the last 168 hours.  DDimer No results for input(s): DDIMER in the last 168 hours.  Radiology/Studies:  Dg Chest 2 View  Result Date: 10/17/2016 CLINICAL DATA:  Worsening shortness of breath over the past 2 months. Current history of hypertension, CHF, coronary artery disease, chronic kidney disease, atrial fibrillation  and COPD. Former smoker. EXAM: CHEST  2 VIEW COMPARISON:  10/12/2016, 10/06/2016 and earlier. FINDINGS: Since the examination 5 days ago, interval resolution of interstitial pulmonary edema. Persistent bilateral pleural effusions and associated consolidation in the lower lobes and right middle lobe. No new abnormalities. Baseline fibrotic changes again demonstrated. Cardiac silhouette mildly to moderately enlarged, unchanged. Thoracic aorta atherosclerotic and mildly tortuous, unchanged. Hilar and mediastinal contours otherwise unremarkable. Degenerative changes and DISH involving the thoracic spine. IMPRESSION: 1. Persistent bilateral pleural effusions and associated passive atelectasis versus pneumonia involving the lower lobes bilaterally and the right middle lobe since the examination 5 days ago. 2. Interval resolution of the interstitial pulmonary edema present on the examination 5 days ago. 3. No new abnormalities. Electronically Signed   By: Evangeline Dakin M.D.   On: 10/17/2016 20:22   Mr Jodene Nam Abdomen Wo Contrast  Result Date: 10/18/2016 CLINICAL DATA:  76 year old male with a history of infrarenal abdominal aortic aneurysm measuring as large as 8.8 cm before treatment with endovascular repair. Repair performed 06/03/2009, with main body from the right. There was enlargement on follow-up CT 09/08/2013 with largest diameter at that time 9.6 cm. Angiogram 10/13/2013 revealed no evidence of type 2 endoleak. Follow-up CT studies since the angiogram have demonstrated relatively stable size of the aneurysm sac, with greatest diameter measuring between 8.8 cm and 9 cm. Ultrasound performed 10/12/2016  demonstrates estimated diameter 9.4 cm. Patient presents for MR evaluation. Study was scheduled with gadolinium contrast, although the patient was unable to tolerate completion of the study and was performed without only. EXAM: MRI/MRA ABDOMEN WITHOUT CONTRAST TECHNIQUE: Multiplanar multisequence MR imaging was  performed without the administration of intravenous contrast. COMPARISON:  Ultrasound 10/12/2016, most recent CT 12/28/2015. Initial CT 05/31/2009 FINDINGS: Lower chest: Bilateral pleural effusions Vascular: Re- demonstration of infrarenal abdominal aortic aneurysm with changes of endovascular repair/exclusion. Greatest diameter of the excluded aneurysm sac on the current MR estimated 9.1 cm (axial images). This is not significantly changed in size from the recent CT of 12/28/2015 when the greatest diameter measured 8.9 cm. Nonvascular: T2 intense cyst of the hepatic dome measures 16 mm, unchanged from prior CT studies. Unremarkable MR appearance of small bowel and colon. Surgical changes of left nephrectomy. Unremarkable appearance of the right kidney with T2 intense cyst on the lateral cortex, unchanged from prior. Uniform marrow signal. Endplate changes compatible with Schmorl's nodes. No compression deformity. No bony canal narrowing. IMPRESSION: Re- demonstration of surgically treated infrarenal abdominal aortic aneurysm with stent graft. Greatest diameter of the excluded aneurysm sac is relatively unchanged compared to most recent CT studies, approximately 9.1 cm. Bilateral pleural effusions. Surgical changes of prior left nephrectomy. Signed, Dulcy Fanny. Earleen Newport, DO [facility] Radiology Electronically Signed   By: Corrie Mckusick D.O.   On: 10/18/2016 14:35    Assessment and Plan:   68M with CAD (s/p PCI to LAD/diagonal, subtotal of PDA 2006), permanent atrial fibrillation (pt preferred Coumadin), AAA repair 2011 (with enlarged aneurysm sac, followed by VVS), renal cell carcinoma s/p prior nephrectomy, CKD stage III-IV (baseline appears 9.3-7.1), chronic systolic CHF as of 10/9676 with new LBBB, probable dementia, HLD, COPD chronic respiratory failure, former tobacco abuse, GERD, HTN, sleep apnea, occult strokes on CT 08/2016 who was admitted with ABL anemia/GIB.  [place] Hgb 7.2.   1. ABL anemia/GIB felt due to duodenal ulcers - being managed by internal medicine and GI who have resumed heparin-> Coumadin. S/p 3 U PRBCs per IM note. Hgb with slow downtrend over the last 5 days since 9.3->8.0. Would be very cautious about anticoagulation - would need to follow INR and Hgb closely from here on out. He will need to be followed carefully as OP because if he has recurrent bleeding issues or begins falling, may not be ideal candidate for anticoagulation.   2. Acute on chronic systolic CHF/cardiomyopathy/LBBB - continue strict I/O's, daily weights, remains volume overloaded at this time but he does report some improvement over the last 1-2 days. Not really sure how accurate I/O's have been. Weight certainly is markedly inconsistent. Will discuss diuretic regimen with Dr. Debara Pickett. May need to increase given CKD. Would hold off on ischemic eval at this time given his recent GIB as even if we were to find obstructive CAD, would not be safe to start DAPT plus Coumadin at this time. He has not had any recent chest pain.  3. Permanent atrial fib with elevated rates - suspect elevated rate due to the fact that he remains anemic compared to baseline. Continue beta blocker. Would avoid diltiazem given his LV dysfunction. See above re: anticoagulation.  4. CKD III-IV - recent baseline appears between 1.8-2.3. Follow with diuresis.  5. NSVT - agree this looks more like NSVT than AF with abberrancy. This occurred 2 days ago when K was 2.8. He has not had any further ectopy on telemetry, only AF  with LBBB. Would continue to replete lytes as you are doing. Consider repleting magnesium as well conservatively - will defer to IM given his renal insufficiency. Would aim for goal K 4.0 or higher, Mg 2.0 or higher.   Signed, Charlie Pitter, PA-C  10/20/2016 4:06 PM

## 2016-10-20 NOTE — Progress Notes (Signed)
ANTICOAGULATION CONSULT NOTE - [place] for Heparin + warfarin Indication:  Afib, with recent GI bleed  Allergies  Allergen Reactions  . Ace Inhibitors Cough    Patient Measurements: Height: 5\' 11"  (180.3 cm) Weight: 222 lb (100.7 kg) IBW/kg (Calculated) : 75.3  Vital Signs: Temp: 98.1 F (36.7 C) (06/15 1435) Temp Source: Oral (06/15 1435) BP: 120/76 (06/15 1715) Pulse Rate: 96 (06/15 1715)  Labs:  Recent Labs  10/18/16 0411  10/19/16 0354  10/19/16 1648 10/20/16 0020 10/20/16 0540 10/20/16 0826 10/20/16 1918  HGB 8.6*  --  8.2*  --   --   --  8.0*  --   --   HCT 27.9*  --  26.5*  --   --   --  26.1*  --   --   PLT 130*  --  142*  --   --   --  155  --   --   LABPROT 15.8*  --   --   --   --   --  16.1*  --   --   INR 1.25  --   --   --   --   --  1.28  --   --   HEPARINUNFRC  --   < > 0.17*  < >  --  0.65  --  0.67 0.55  CREATININE  --   --  1.87*  --  2.06*  --  2.03*  --   --   < > = values in this interval not displayed.  Estimated Creatinine Clearance: 39.2 mL/min (A) (by C-G formula based on SCr of 2.03 mg/dL (H)).   Assessment: 76 year old male on warfarin prior to admission admitted for GI bleed.  INR reversed with Kcentra on admission with plans to hold anti-coagulation for 7 days from 6/6.     Vascular surgery has seen, and will not intervene on AAA during this admission. Warfarin was restarted yesterday.  Heparin level trending closer to goal now at 0.55  INR 1.28 this morning. Home dose of warfarin 5mg  daily except 7.5mg  on Wednesdays.   Hgb 8, plts 155- no bleeding noted.  Goal of Therapy:  Heparin level 0.3-0.5 units/ml (due to recent bleed) INR 2-3 Monitor platelets by anticoagulation protocol: Yes   Plan:  -Decrease heparin to 1250 units/hr -Heparin level, INR, and CBC daily  Thank you Anette Guarneri, PharmD [phone] 10/20/2016 8:16 PM

## 2016-10-20 NOTE — Telephone Encounter (Signed)
-----   Message from Mena Goes, RN sent at 10/19/2016  3:50 PM EDT ----- Regarding: 6 months w/ EVAR duplex   ----- Message ----- From: Serafina Mitchell, MD Sent: 10/19/2016   3:37 PM To: Vvs Charge Pool  10-19-2016:  Level 4 consult  F/u with me in 6 months with EVAR duplex

## 2016-10-21 LAB — PROTIME-INR
INR: 1.26
PROTHROMBIN TIME: 15.9 s — AB (ref 11.4–15.2)

## 2016-10-21 LAB — BASIC METABOLIC PANEL
ANION GAP: 8 (ref 5–15)
BUN: 28 mg/dL — ABNORMAL HIGH (ref 6–20)
CO2: 28 mmol/L (ref 22–32)
CREATININE: 2.01 mg/dL — AB (ref 0.61–1.24)
Calcium: 8.5 mg/dL — ABNORMAL LOW (ref 8.9–10.3)
Chloride: 104 mmol/L (ref 101–111)
GFR calc Af Amer: 36 mL/min — ABNORMAL LOW (ref >60)
GFR, EST NON AFRICAN AMERICAN: 31 mL/min — AB (ref >60)
GLUCOSE: 107 mg/dL — AB (ref 65–99)
Potassium: 4.3 mmol/L (ref 3.5–5.1)
Sodium: 140 mmol/L (ref 135–145)

## 2016-10-21 LAB — CBC
HEMATOCRIT: 28.7 % — AB (ref 39.0–52.0)
Hemoglobin: 8.6 g/dL — ABNORMAL LOW (ref 13.0–17.0)
MCH: 29 pg (ref 26.0–34.0)
MCHC: 30 g/dL (ref 30.0–36.0)
MCV: 96.6 fL (ref 78.0–100.0)
PLATELETS: 158 10*3/uL (ref 150–400)
RBC: 2.97 MIL/uL — ABNORMAL LOW (ref 4.22–5.81)
RDW: 18.4 % — AB (ref 11.5–15.5)
WBC: 4.2 10*3/uL (ref 4.0–10.5)

## 2016-10-21 LAB — MAGNESIUM: Magnesium: 2 mg/dL (ref 1.7–2.4)

## 2016-10-21 LAB — HEPARIN LEVEL (UNFRACTIONATED)
HEPARIN UNFRACTIONATED: 0.19 [IU]/mL — AB (ref 0.30–0.70)
Heparin Unfractionated: 0.3 IU/mL (ref 0.30–0.70)

## 2016-10-21 MED ORDER — WARFARIN SODIUM 7.5 MG PO TABS
7.5000 mg | ORAL_TABLET | Freq: Once | ORAL | Status: AC
  Administered 2016-10-21: 7.5 mg via ORAL
  Filled 2016-10-21: qty 1

## 2016-10-21 NOTE — Progress Notes (Signed)
Progress Note  Patient Name: Mark [name] Date of Encounter: 10/21/2016  Primary Cardiologist: Stanford Breed  Subjective   No complaints this morning. He was admitted with GI bleed and decompensated heart failure. He was transfused 3 units of packed red blood cells. EGD did not show bleeding source. He had 2-3+ pitting edema as well as sacral edema. His diuretics were increased to 40 mg IV 3 times a day (home Lasix to 60 mg by mouth daily). He is in chronic A. fib and somewhat tachycardic.  Inpatient Medications    Scheduled Meds: . atorvastatin  40 mg Oral Daily  . carvedilol  12.5 mg Oral BID WC  . feeding supplement (ENSURE ENLIVE)  237 mL Oral BID BM  . furosemide  40 mg Intravenous TID  . isosorbide dinitrate  10 mg Oral TID  . pantoprazole  40 mg Oral BID  . polyethylene glycol  17 g Oral Daily  . sertraline  100 mg Oral QPM  . sodium chloride flush  3 mL Intravenous Q12H  . tamsulosin  0.4 mg Oral QPC breakfast  . warfarin  7.5 mg Oral ONCE-1800  . Warfarin - Pharmacist Dosing Inpatient   Does not apply q1800   Continuous Infusions: . heparin 1,300 Units/hr (10/21/16 0833)   PRN Meds: acetaminophen, levalbuterol, ondansetron **OR** ondansetron (ZOFRAN) IV   Vital Signs    Vitals:   10/20/16 2142 10/21/16 0500 10/21/16 0649 10/21/16 0844  BP: 130/76  124/86   Pulse: 98  93   Resp: 18  18   Temp: 98.2 F (36.8 C)  98.1 F (36.7 C)   TempSrc:   Oral   SpO2: 99%  95% 97%  Weight:  226 lb 9.6 oz (102.8 kg)    Height:        Intake/Output Summary (Last 24 hours) at 10/21/16 0854 Last data filed at 10/21/16 0700  Gross per 24 hour  Intake            397.9 ml  Output             1900 ml  Net          -1502.1 ml   Filed Weights   10/19/16 0209 10/20/16 0500 10/21/16 0500  Weight: 174 lb (78.9 kg) 222 lb (100.7 kg) 226 lb 9.6 oz (102.8 kg)    Telemetry    Atrial fibrillation with a ventricular response in the 120 range with bundle branch block - Personally  Reviewed  ECG    Not performed today - Personally Reviewed  Physical Exam   GEN: No acute distress.   Neck: No JVD Cardiac: Irregularly irregular, no murmurs, rubs, or gallops. Tachycardic Respiratory: Clear to auscultation bilaterally. GI: Soft, nontender, non-distended  MS: No edema; No deformity. Neuro:  Nonfocal  Psych: Normal affect  Extremities-2-3+ pitting edema bilaterally   Labs    Chemistry Recent Labs Lab 10/15/16 0531  10/19/16 1648 10/20/16 0540 10/21/16 0632  NA 140  < > 140 139 140  K 3.8  < > 3.4* 3.3* 4.3  CL 110  < > 109 108 104  CO2 22  < > 22 26 28   GLUCOSE 118*  < > 81 88 107*  BUN 36*  < > 26* 26* 28*  CREATININE 2.00*  < > 2.06* 2.03* 2.01*  CALCIUM 8.4*  < > 7.8* 8.2* 8.5*  PROT 5.1*  --   --   --   --   ALBUMIN 2.6*  --   --   --   --  AST 21  --   --   --   --   ALT 17  --   --   --   --   ALKPHOS 61  --   --   --   --   BILITOT 2.0*  --   --   --   --   GFRNONAA 31*  < > 30* 31* 31*  GFRAA 36*  < > 35* 36* 36*  ANIONGAP 8  < > 9 5 8   < > = values in this interval not displayed.   Hematology Recent Labs Lab 10/19/16 0354 10/20/16 0540 10/21/16 0632  WBC 4.4 4.3 4.2  RBC 2.84* 2.77* 2.97*  HGB 8.2* 8.0* 8.6*  HCT 26.5* 26.1* 28.7*  MCV 93.3 94.2 96.6  MCH 28.9 28.9 29.0  MCHC 30.9 30.7 30.0  RDW 18.3* 18.3* 18.4*  PLT 142* 155 158    Cardiac EnzymesNo results for input(s): TROPONINI in the last 168 hours. No results for input(s): TROPIPOC in the last 168 hours.   BNPNo results for input(s): BNP, PROBNP in the last 168 hours.   DDimer No results for input(s): DDIMER in the last 168 hours.   Radiology    No results found.  Cardiac Studies   2-D echo (08/15/16)  Study Conclusions  - Left ventricle: The cavity size was mildly dilated. Wall   thickness was increased in a pattern of mild LVH. There was   moderate focal basal hypertrophy of the septum. Systolic function   was severely reduced. The estimated ejection  fraction was in the   range of 20% to 25%. Diffuse hypokinesis. The study is not   technically sufficient to allow evaluation of LV diastolic   function. - Aortic valve: There was mild regurgitation. - Aortic root: The aortic root was mildly dilated. - Ascending aorta: The ascending aorta was mildly dilated. - Mitral valve: There was moderate regurgitation. - Left atrium: The atrium was severely dilated. - Right ventricle: The cavity size was mildly dilated. - Right atrium: The atrium was moderately dilated.  Impressions:  - Severe LV systolic dysfunction; mild AI; mildly dilated aortic   root (4.6 cm; suggest CTA or MRA to further assess); moderate MR;   biatrial enlargement; mild RVE; mild TR.  Patient Profile     76 y.o. male patient of Dr. Jacalyn Lefevre with history of CAD status post PCI of LAD and first diagonal branch back in 2006. His last Myoview performed 11/12/12 was nonischemic. He denies chest pain but has had progressive shortness of breath. He also has chronic A. fib on Coumadin anticoagulation, hypertension, LV dysfunction with EF of 2025% range by 2-D echo in April and abdominal aortic aneurysm status post endoluminal stent grafting. He was admitted with melena. EGD did not show bleeding source. He was transfused 3 units of packed red blood cells and hemoglobin has remained stable in the mid 8 range. Coumadin was discontinued and he was begun on heparin. Because of the stability of his hemoglobin Coumadin was restarted. He is being diuresed with IV diuretics although his I/Os  remained positive. He continues to have 2-3+ pitting edema.  Assessment & Plan    1: Systolic heart failure-ejection fraction 20-25% for unclear reasons. He never had a Myoview as outlined by Dr. Stanford Breed in his note in April. He is on appropriate medications however. He is volume overloaded to 3+ pitting edema as well as tachycardic. His diuretics have been adjusted currently getting IV Lasix 40 mg 3  times a day (was getting 60 mg by mouth daily at home) August his I/os have remained positive. Continue IV diuresis.  2: Coronary artery disease-stable, no chest pain  3: Atrial fibrillation-ventricular response increased in the 120 range. His carvedilol can be uptitrated from 12.5 up to 18.75 mg by mouth twice a day with careful follow-up of his heart rate and blood pressure.  4: GI bleed-currently on heparin, Coumadin restarted 2 days ago because the stability of hemoglobin status post transfusion 3 units of packed red blood cells.  Mr. Shurley is clinically stable. I suggest mild up titration of his carvedilol for his heart rate. Agree with continue IV diuresis as you are doing. Follow renal function and electrolytes. We will see again on Monday.  Angelina Sheriff, MD  10/21/2016, 8:54 AM

## 2016-10-21 NOTE — Clinical Social Work Note (Signed)
CSW notified by Karyl Kinnier received today from [facility] and pt can come today. CSW contacted MD, pt not medically stable to DC today. Facility updated.  Once pt is stable, pt's bed is available @ [facility].  Derrica Sieg B. Joline Maxcy Clinical Social Work Dept Weekend Social Worker [phone] 11:34 AM

## 2016-10-21 NOTE — Progress Notes (Signed)
ANTICOAGULATION CONSULT NOTE - [facility] for Heparin + warfarin Indication:  Afib, with recent GI bleed  Allergies  Allergen Reactions  . Ace Inhibitors Cough    Patient Measurements: Height: 5\' 11"  (180.3 cm) Weight: 226 lb 9.6 oz (102.8 kg) IBW/kg (Calculated) : 75.3  Vital Signs: Temp: 98.1 F (36.7 C) (06/16 0649) Temp Source: Oral (06/16 0649) BP: 124/86 (06/16 0649) Pulse Rate: 93 (06/16 0649)  Labs:  Recent Labs  10/19/16 0354  10/19/16 1648  10/20/16 0540  10/20/16 1918 10/21/16 0632 10/21/16 1548  HGB 8.2*  --   --   --  8.0*  --   --  8.6*  --   HCT 26.5*  --   --   --  26.1*  --   --  28.7*  --   PLT 142*  --   --   --  155  --   --  158  --   LABPROT  --   --   --   --  16.1*  --   --  15.9*  --   INR  --   --   --   --  1.28  --   --  1.26  --   HEPARINUNFRC 0.17*  < >  --   < >  --   < > 0.55 0.19* 0.30  CREATININE 1.87*  --  2.06*  --  2.03*  --   --  2.01*  --   < > = values in this interval not displayed.  Estimated Creatinine Clearance: 40 mL/min (A) (by C-G formula based on SCr of 2.01 mg/dL (H)).   Assessment: 76 year old male on warfarin prior to admission admitted for GI bleed.  INR reversed with Kcentra on admission with plans to hold anti-coagulation for 7 days from 6/6.     Vascular surgery has seen, and will not intervene on AAA during this admission. Warfarin was restarted yesterday.  Heparin level therapeutic at 0.3  Home dose of warfarin 5mg  daily except 7.5mg  on Wednesdays. INR stable on this dose last two outpatient visits, most recently 5/2.   INR without much change to 1.26 today after 2 days of warfarin.  Hgb up to 8.6, plts stable 158. No bleeding noted. No new DDI. Good PO intake.  Goal of Therapy:  Heparin level 0.3-0.5 units/ml (due to recent bleed) INR 2-3 Monitor platelets by anticoagulation protocol: Yes   Plan:  -continue heparin to 1350 units/hr -Warfarin 7.5 mg x1 -daily INR, and CBC  daily -Monitor for s/sx bleeding, new DDI, PO intake, clinical picture  Levester Fresh, PharmD, BCPS, BCCCP Clinical Pharmacist Clinical phone for 10/21/2016 from 7a-3:30p: [phone] If after 3:30p, please call main pharmacy at: [phone] 10/21/2016 4:29 PM

## 2016-10-21 NOTE — Progress Notes (Addendum)
ANTICOAGULATION CONSULT NOTE - [place] for Heparin + warfarin Indication:  Afib, with recent GI bleed  Allergies  Allergen Reactions  . Ace Inhibitors Cough    Patient Measurements: Height: 5\' 11"  (180.3 cm) Weight: 226 lb 9.6 oz (102.8 kg) IBW/kg (Calculated) : 75.3  Vital Signs: Temp: 98.1 F (36.7 C) (06/16 0649) Temp Source: Oral (06/16 0649) BP: 124/86 (06/16 0649) Pulse Rate: 93 (06/16 0649)  Labs:  Recent Labs  10/19/16 0354  10/19/16 1648  10/20/16 0540 10/20/16 0826 10/20/16 1918 10/21/16 0632  HGB 8.2*  --   --   --  8.0*  --   --  8.6*  HCT 26.5*  --   --   --  26.1*  --   --  28.7*  PLT 142*  --   --   --  155  --   --  158  LABPROT  --   --   --   --  16.1*  --   --  15.9*  INR  --   --   --   --  1.28  --   --  1.26  HEPARINUNFRC 0.17*  < >  --   < >  --  0.67 0.55 0.19*  CREATININE 1.87*  --  2.06*  --  2.03*  --   --   --   < > = values in this interval not displayed.  Estimated Creatinine Clearance: 39.6 mL/min (A) (by C-G formula based on SCr of 2.03 mg/dL (H)).   Assessment: 76 year old male on warfarin prior to admission admitted for GI bleed.  INR reversed with Kcentra on admission with plans to hold anti-coagulation for 7 days from 6/6.     Vascular surgery has seen, and will not intervene on AAA during this admission. Warfarin was restarted yesterday.  Heparin level subtherapeutic at 0.19 on 1250/hr. Per RN, no interruptions in line.  Home dose of warfarin 5mg  daily except 7.5mg  on Wednesdays. INR stable on this dose last two outpatient visits, most recently 5/2.   INR without much change to 1.26 today after 2 days of warfarin.  Hgb up to 8.6, plts stable 158. No bleeding noted. No new DDI. Good PO intake.  Goal of Therapy:  Heparin level 0.3-0.5 units/ml (due to recent bleed) INR 2-3 Monitor platelets by anticoagulation protocol: Yes   Plan:  -Increase heparin to 1350 units/hr -Warfarin 7.5 mg x1 -8-hr  Heparin level, INR, and CBC daily -Monitor for s/sx bleeding, new DDI, PO intake, clinical picture  Thank you  Carlean Jews, Pharm.D. PGY1 Pharmacy Resident 6/16/20188:13 AM Pager [phone]

## 2016-10-21 NOTE — Progress Notes (Signed)
PROGRESS NOTE   Mark [name]  JHE:[id number]    DOB: [birth year]    DOA: 10/11/2016  PCP: [name], NP   I have briefly reviewed patients previous medical [facility] in [facility].  Brief Narrative:  76 year old male with history of AAA, atrial fibrillation on Coumadin anticoagulation, CAD, COPD, GERD, renal cell carcinoma status post nephrectomy, HTN and dementia presented with tachycardia and Malena. Patient was admitted and discharged on 10/07/16 for confusion with slurred speech, at which time an MRI of the brain was negative for acute CVA. Patient was seen by his PCP 6/6  for hospital follow-up, and noted to have a heart rate in the 150s. He was referred to [facility] where he also reported 2-3 days of melena. In the ED, his heart rate was 140, guaiac positive, INR 2.3 and hemoglobin 8.3. Status post EGD. Hemoglobin stable over the last 4 days. Treating for decompensated CHF. Despite ongoing Rx for CHF, diuresis, reports DOE. Cardiology consulted for assistance on 6/15.   Assessment & Plan:   Principal Problem:   GI bleed Active Problems:   Hyperlipidemia   Depression   Essential hypertension   Coronary atherosclerosis   Abdominal aortic aneurysm (HCC)   Atrial fibrillation, chronic (HCC)   Asthma   Acute on chronic respiratory failure with hypoxia (HCC)   Acute systolic CHF (congestive heart failure) ([place])   1. Acute upper GI bleed, secondary to multiple duodenal ulcers: Status post EGD. H pylori screen negative. Continue twice a day PPI. Clinically resolved. VVS does not think he has a aorto enteric fistula. 2. Acute blood loss anemia: Hemoglobin dropped from 11.3-8.3 in 4 days. Transfused 3 units PRBCs thus far. Given history of CAD, keep hemoglobin at 8 g or higher. Hemoglobin stable in the 8 range. Follow 3. Acute on chronic systolic CHF: 2-D echo 1/85/63: LVEF 20-25 percent with diffuse hypokinesis. No adequate response with home dose of Lasix 60 mg PO  daily. IV Lasix initiated. Cardiology consultation and follow-up appreciated. Increased Lasix to 40 mg 3 times a day. Significant volume overload. Discussed repeatedly with daily nursing to have strict intake and output monitored. -1900 on 6/15. 4. Acute on chronic hypoxic respiratory failure: Patient on home oxygen 3 L/m continuously. Worsening dyspnea likely secondary to decompensated CHF with some contribution from anemia. Low index of suspicion for pneumonia. Pro-calcitonin <0.1. Also bilateral atelectasis and small pleural effusions on chest x-ray. Incentive spirometry. Monitor. 5. Stage III chronic kidney disease: Baseline creatinine 1.8. Monitor closely while on IV Lasix. Creatinine stable in the low 2 range for the last 3 days. 6. Permanent A. fib with RVR: Anticoagulation had been reversed on 10/11/16. Currently on IV heparin bridging and Coumadin. Reasonable rate control on carvedilol 12.5 MG BID. 7. AAA: Ultrasound of aorta without evidence of entero-aortic fistula. No BRBPR noted during this hospital stay. MRA abdomen 6/12: Redemonstration of surgically treated infrarenal AAA with stent graft. Greatest diameter of the excluded aneurysm sac is relatively unchanged compared to most recent CT studies, approximately 9.1 cm. discussed with vascular surgery and their input appreciated: His AAA has remained stable over time and no intervention is currently recommended. Follow-up with vascular surgery in 6 months to with repeat ultrasound. Does not think that he has a aorto enteric fistula. There is no stranding on his MRI around the graft. 8. Hypokalemia: Replaced.  9. CAD status post PCI: Asymptomatic of chest pain. 10. 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 Family reported progressive cognitive decline and feels that he is no longer safe to live on his own. PT/OT suggest SNF placement and patient agreeable. DC to SNF when medically  stable. 11. Hyperlipidemia: Statins. 12. Essential hypertension: Controlled. 13. BPH: Continue Flomax. 14. Depression: Stable. Continue Zoloft. 15. Renal cell carcinoma, status post left nephrectomy. 16. COPD: Stable without clinical bronchospasm. 17. Thrombocytopenia, chronic: ? Secondary to acute blood loss. Resolved. 18. NSVT: 13 beat NSVT noted on 6/13 at 8:41 AM. Does not seem like A. fib with aberrancy. Possibly due to his cardiomyopathy. Continue monitoring on telemetry. Replaced potassium and magnesium close to 4 and 2 respectively. No further episodes.   DVT prophylaxis: On IV heparin drip. Code Status: Full Family Communication: None at bedside Disposition: DC to SNF when medically stable   Consultants:  [facility]  Procedures:  EGD  Antimicrobials:  None    Subjective: Breathing better. Denies dyspnea or chest pain. Seemed slightly more pleasantly confused this morning. As per RN, no acute issues reported.   ROS: normal colored stools without blood or melena.  Objective:  Vitals:   10/20/16 2142 10/21/16 0500 10/21/16 0649 10/21/16 0844  BP: 130/76  124/86   Pulse: 98  93   Resp: 18  18   Temp: 98.2 F (36.8 C)  98.1 F (36.7 C)   TempSrc:   Oral   SpO2: 99%  95% 97%  Weight:  102.8 kg (226 lb 9.6 oz)    Height:        Examination:  General exam: Pleasant elderly male sitting up in bed. Respiratory system: Diminished breath sounds in the bases with occasional basal crackles. Rest clear. No increased work of breathing.  Cardiovascular system: S1 and S2 heard, irregularly irregular. No JVD. 2+ pitting bilateral leg edema owing up to the sacrum. Telemetry: A. fib with controlled ventricular rate/BBB morphology. No further NSVT. Gastrointestinal system: abdomen is nondistended, soft and nontender. Normal bowel sounds heard. Central nervous system: alert and oriented 2. No focal deficits. Appears slightly more confused this  morning. Extremities: Symmetric 5 x 5 power. moves all limbs equally. Skin: No rashes, lesions or ulcers Psychiatry: Judgement and insight appears impaired. Mood & affect appropriate.     Data Reviewed: I have personally reviewed following labs and imaging studies  CBC:  Recent Labs Lab 10/17/16 0343 10/18/16 0411 10/19/16 0354 10/20/16 0540 10/21/16 0632  WBC 4.7 4.5 4.4 4.3 4.2  HGB 8.6* 8.6* 8.2* 8.0* 8.6*  HCT 27.7* 27.9* 26.5* 26.1* 28.7*  MCV 93.0 93.0 93.3 94.2 96.6  PLT 116* 130* 142* 155 921   Basic Metabolic Panel:  Recent Labs Lab 10/15/16 0531 10/19/16 0354 10/19/16 1648 10/20/16 0540 10/20/16 1255 10/21/16 0632  NA 140 139 140 139  --  140  K 3.8 2.8* 3.4* 3.3*  --  4.3  CL 110 107 109 108  --  104  CO2 22 24 22 26   --  28  GLUCOSE 118* 82 81 88  --  107*  BUN 36* 29* 26* 26*  --  28*  CREATININE 2.00* 1.87* 2.06* 2.03*  --  2.01*  CALCIUM 8.4* 7.9* 7.8* 8.2*  --  8.5*  MG 1.9  --  1.6*  --  1.8 2.0   Liver Function Tests:  Recent Labs Lab 10/15/16 0531  AST 21  ALT 17  ALKPHOS 61  BILITOT 2.0*  PROT 5.1*  ALBUMIN 2.6*   Coagulation Profile:  Recent Labs Lab 10/18/16 0411 10/20/16  0540 10/21/16 0632  INR 1.25 1.28 1.26   Cardiac Enzymes: No results for input(s): CKTOTAL, CKMB, CKMBINDEX, TROPONINI in the last 168 hours.   Recent Results (from the past 240 hour(s))  MRSA PCR Screening     Status: None   Collection Time: 10/11/16  5:55 PM  Result Value Ref Range Status   MRSA by PCR NEGATIVE NEGATIVE Final    Comment:        The GeneXpert MRSA Assay (FDA approved for NASAL specimens only), is one component of a comprehensive MRSA colonization surveillance program. It is not intended to diagnose MRSA infection nor to guide or monitor treatment for MRSA infections.          Radiology Studies: No results found.      Scheduled Meds: . atorvastatin  40 mg Oral Daily  . carvedilol  12.5 mg Oral BID WC  . feeding  supplement (ENSURE ENLIVE)  237 mL Oral BID BM  . furosemide  40 mg Intravenous TID  . isosorbide dinitrate  10 mg Oral TID  . pantoprazole  40 mg Oral BID  . polyethylene glycol  17 g Oral Daily  . sertraline  100 mg Oral QPM  . sodium chloride flush  3 mL Intravenous Q12H  . tamsulosin  0.4 mg Oral QPC breakfast  . warfarin  7.5 mg Oral ONCE-1800  . Warfarin - Pharmacist Dosing Inpatient   Does not apply q1800   Continuous Infusions: . heparin 1,300 Units/hr (10/21/16 0833)     LOS: 10 days     HONGALGI,ANAND, MD, FACP, FHM. [facility] Pager [phone]  If 7PM-7AM, please contact night-coverage [email or web address] Password [email or web address] 10/21/2016, 2:55 PM ]

## 2016-10-22 DIAGNOSIS — J810 Acute pulmonary edema: Secondary | ICD-10-CM

## 2016-10-22 DIAGNOSIS — I2510 Atherosclerotic heart disease of native coronary artery without angina pectoris: Secondary | ICD-10-CM

## 2016-10-22 DIAGNOSIS — J811 Chronic pulmonary edema: Secondary | ICD-10-CM

## 2016-10-22 LAB — BASIC METABOLIC PANEL
ANION GAP: 7 (ref 5–15)
BUN: 31 mg/dL — AB (ref 6–20)
CALCIUM: 8.3 mg/dL — AB (ref 8.9–10.3)
CO2: 28 mmol/L (ref 22–32)
Chloride: 104 mmol/L (ref 101–111)
Creatinine, Ser: 2.01 mg/dL — ABNORMAL HIGH (ref 0.61–1.24)
GFR calc Af Amer: 36 mL/min — ABNORMAL LOW (ref >60)
GFR calc non Af Amer: 31 mL/min — ABNORMAL LOW (ref >60)
GLUCOSE: 104 mg/dL — AB (ref 65–99)
Potassium: 3.6 mmol/L (ref 3.5–5.1)
Sodium: 139 mmol/L (ref 135–145)

## 2016-10-22 LAB — CBC
HEMATOCRIT: 26 % — AB (ref 39.0–52.0)
Hemoglobin: 7.9 g/dL — ABNORMAL LOW (ref 13.0–17.0)
MCH: 28.6 pg (ref 26.0–34.0)
MCHC: 30.4 g/dL (ref 30.0–36.0)
MCV: 94.2 fL (ref 78.0–100.0)
PLATELETS: 161 10*3/uL (ref 150–400)
RBC: 2.76 MIL/uL — ABNORMAL LOW (ref 4.22–5.81)
RDW: 18.1 % — AB (ref 11.5–15.5)
WBC: 4 10*3/uL (ref 4.0–10.5)

## 2016-10-22 LAB — PROTIME-INR
INR: 1.33
Prothrombin Time: 16.6 seconds — ABNORMAL HIGH (ref 11.4–15.2)

## 2016-10-22 LAB — PROCALCITONIN

## 2016-10-22 LAB — HEPARIN LEVEL (UNFRACTIONATED): Heparin Unfractionated: <0.1 IU/mL — ABNORMAL LOW (ref 0.30–0.70)

## 2016-10-22 MED ORDER — WARFARIN SODIUM 5 MG PO TABS
10.0000 mg | ORAL_TABLET | Freq: Once | ORAL | Status: AC
  Administered 2016-10-22: 10 mg via ORAL
  Filled 2016-10-22: qty 2

## 2016-10-22 NOTE — Progress Notes (Signed)
ANTICOAGULATION CONSULT NOTE - [facility] for Heparin Indication:  Afib, with recent GI bleed  Allergies  Allergen Reactions  . Ace Inhibitors Cough    Patient Measurements: Height: 5\' 11"  (180.3 cm) Weight: 219 lb 11.2 oz (99.7 kg) IBW/kg (Calculated) : 75.3  Vital Signs: Temp: 97.6 F (36.4 C) (06/17 1521) Temp Source: Oral (06/17 1521) BP: 110/72 (06/17 1521) Pulse Rate: 96 (06/17 1521)  Labs:  Recent Labs  10/20/16 0540  10/21/16 7035 10/21/16 1548 10/22/16 0514 10/22/16 1454  HGB 8.0*  --  8.6*  --  7.9*  --   HCT 26.1*  --  28.7*  --  26.0*  --   PLT 155  --  158  --  161  --   LABPROT 16.1*  --  15.9*  --  16.6*  --   INR 1.28  --  1.26  --  1.33  --   HEPARINUNFRC  --   < > 0.19* 0.30 <0.10* <0.10*  CREATININE 2.03*  --  2.01*  --  2.01*  --   < > = values in this interval not displayed.  Estimated Creatinine Clearance: 39.4 mL/min (A) (by C-G formula based on SCr of 2.01 mg/dL (H)).   Assessment: 76 year old male on warfarin prior to admission admitted for GI bleed.  INR reversed with Kcentra on admission with plans to hold anti-coagulation for 7 days from 6/6.     Vascular surgery has seen, and will not intervene on AAA during this admission. Warfarin was restarted 6/14.  Heparin level down to undetectable on 1300 units/hr. Pt has pulled line out a few times overnight- has been a difficult stick and unable to obtain second IV site yet. Rate wasn't increased earlier, but a repeat level is still undetectable this evening.  Goal of Therapy:  Heparin level 0.3-0.5 units/ml (due to recent bleed) INR 2-3 Monitor platelets by anticoagulation protocol: Yes   Plan:  -Increase heparin to 1400 units/hr -Heparin level at 0100 -Daily heparin level, and CBC daily  Jaileigh Weimer D. Yitzel Shasteen, PharmD, BCPS Clinical Pharmacist 10/22/2016 5:01 PM

## 2016-10-22 NOTE — Progress Notes (Signed)
PROGRESS NOTE    Mark [name]  KGM:[id number] DOB: 05-27-1940 DOA: 10/11/2016 PCP: [name], NP    Brief Narrative:  76 year old male presented with tachycardia and GI bleeding. Patient known to have abdominal aortic aneurysm, atrial fibrillation on warfarin, coronary artery disease, COPD, hypertension and dementia. Patient was found tachycardic, 150 bpm, reported to be weak for last 2 to 3 days, associated with melena. On initial physical examination blood pressure 112/72, heart rate 140, temperature 98.1, respiratory 20, moist mucous membranes, lungs clear to auscultation bilaterally, heart S1-S2 present irregularly irregular, soft nontender, no lower extremity edema. Sodium 141, potassium 3.8, chloride 111, bicarbonate 20, glucose 112, BUN 25, creatinine 2,15. White count 7.1, hemoglobin 8.3, hematocrit 36.3, platelets 138, INR 2,41. Ekg with atrial fibrillation and a left bundle branch block. Chest x-ray with bilateral pleural effusions.  Patient admitted with acute lower GI bleed associated with vitamin K coagulopathy. EGD with multiple nonbleeding duodenal ulcers. Has developed decompensated heart failure.   Assessment & Plan:   Principal Problem:   GI bleed Active Problems:   Hyperlipidemia   Depression   Essential hypertension   Coronary atherosclerosis   Abdominal aortic aneurysm (HCC)   Atrial fibrillation, chronic (HCC)   Asthma   Acute on chronic respiratory failure with hypoxia (HCC)   Acute systolic CHF (congestive heart failure) (HCC)   Pulmonary edema   1. Decompensated systolic heart failure. Continue coreg, furosemide 40 mg tid and isosorbide. Urine output 1490 cc over last 24 HR. Persistent edema at the lower extremities. Left ventricle ejection fraction 25%.   2. Upper Gi bleed due to duodenal ulcers, complicated with acute blood loss anemia. No signs of further bleeding, hb and hct have remain stable. Tolerating po well, continue on antiacid therapy.    3. CKD stage 3. Renal function with cr at 2,0, K at 3,6, tolerating well high doses of IV furosemide, follow on renal panel in am, avoid hypotension or nephrotoxic medications.   4. Chronic atrial fibrillation. Will continue rate control with coreg, anticoagulation with heparin drip,  INR 1,33. Continue with warfarin per pharmacy protocol.   5, Aortic abdominal aneurysm. Stable with no signs of decompensation.    DVT prophylaxis:heparin  Code Status: Full  Family Communication: No family at the bedside  Disposition Plan: SNF  Consultants:     Procedures:     Antimicrobials:     Subjective: Dyspnea improving, persistent lower extremity edema, no nausea or vomiting, tolerating po well, no melena or hematemesis.   Objective: Vitals:   10/21/16 1756 10/21/16 2206 10/22/16 0529 10/22/16 0537  BP: 117/85 111/68  115/83  Pulse: (!) 105 (!) 102  98  Resp: 19 18  19   Temp: 97.8 F (36.6 C) 98.2 F (36.8 C)  97.6 F (36.4 C)  TempSrc: Oral Oral  Oral  SpO2: 92% 99%  91%  Weight:   99.7 kg (219 lb 11.2 oz)   Height:        Intake/Output Summary (Last 24 hours) at 10/22/16 1133 Last data filed at 10/22/16 0736  Gross per 24 hour  Intake           290.86 ml  Output             1690 ml  Net         -1399.14 ml   Filed Weights   10/20/16 0500 10/21/16 0500 10/22/16 0529  Weight: 100.7 kg (222 lb) 102.8 kg (226 lb 9.6 oz) 99.7 kg (219 lb 11.2 oz)  Examination:  General exam: deconditioned E ENT: mild pallor, but no icterus, oral mucosa moist,  Respiratory system: decreased breath sounds at bases, no wheezing, rales or rhonchi.  Cardiovascular system: S1 & S2 heard, RRR. No JVD, murmurs, rubs, gallops or clicks. +++ pitting edema. Gastrointestinal system: Abdomen is nondistended, soft and nontender. No organomegaly or masses felt. Normal bowel sounds heard. Central nervous system: Alert and oriented. No focal neurological deficits. Extremities: Symmetric 5 x 5  power. Skin: No rashes, lesions or ulcers     Data Reviewed: I have personally reviewed following labs and imaging studies  CBC:  Recent Labs Lab 10/18/16 0411 10/19/16 0354 10/20/16 0540 10/21/16 0632 10/22/16 0514  WBC 4.5 4.4 4.3 4.2 4.0  HGB 8.6* 8.2* 8.0* 8.6* 7.9*  HCT 27.9* 26.5* 26.1* 28.7* 26.0*  MCV 93.0 93.3 94.2 96.6 94.2  PLT 130* 142* 155 158 161   Basic Metabolic Panel:  Recent Labs Lab 10/19/16 0354 10/19/16 1648 10/20/16 0540 10/20/16 1255 10/21/16 0632 10/22/16 0514  NA 139 140 139  --  140 139  K 2.8* 3.4* 3.3*  --  4.3 3.6  CL 107 109 108  --  104 104  CO2 24 22 26   --  28 28  GLUCOSE 82 81 88  --  107* 104*  BUN 29* 26* 26*  --  28* 31*  CREATININE 1.87* 2.06* 2.03*  --  2.01* 2.01*  CALCIUM 7.9* 7.8* 8.2*  --  8.5* 8.3*  MG  --  1.6*  --  1.8 2.0  --    GFR: Estimated Creatinine Clearance: 39.4 mL/min (A) (by C-G formula based on SCr of 2.01 mg/dL (H)). Liver Function Tests: No results for input(s): AST, ALT, ALKPHOS, BILITOT, PROT, ALBUMIN in the last 168 hours. No results for input(s): LIPASE, AMYLASE in the last 168 hours. No results for input(s): AMMONIA in the last 168 hours. Coagulation Profile:  Recent Labs Lab 10/18/16 0411 10/20/16 0540 10/21/16 0632 10/22/16 0514  INR 1.25 1.28 1.26 1.33   Cardiac Enzymes: No results for input(s): CKTOTAL, CKMB, CKMBINDEX, TROPONINI in the last 168 hours. BNP (last 3 results) No results for input(s): PROBNP in the last 8760 hours. HbA1C: No results for input(s): HGBA1C in the last 72 hours. CBG: No results for input(s): GLUCAP in the last 168 hours. Lipid Profile: No results for input(s): CHOL, HDL, LDLCALC, TRIG, CHOLHDL, LDLDIRECT in the last 72 hours. Thyroid Function Tests: No results for input(s): TSH, T4TOTAL, FREET4, T3FREE, THYROIDAB in the last 72 hours. Anemia Panel: No results for input(s): VITAMINB12, FOLATE, FERRITIN, TIBC, IRON, RETICCTPCT in the last 72  hours. Sepsis Labs:  Recent Labs Lab 10/18/16 1115 10/20/16 0540 10/22/16 0514  PROCALCITON <0.10 <0.10 <0.10    No results found for this or any previous visit (from the past 240 hour(s)).       Radiology Studies: No results found.      Scheduled Meds: . atorvastatin  40 mg Oral Daily  . carvedilol  12.5 mg Oral BID WC  . feeding supplement (ENSURE ENLIVE)  237 mL Oral BID BM  . furosemide  40 mg Intravenous TID  . isosorbide dinitrate  10 mg Oral TID  . pantoprazole  40 mg Oral BID  . polyethylene glycol  17 g Oral Daily  . sertraline  100 mg Oral QPM  . sodium chloride flush  3 mL Intravenous Q12H  . tamsulosin  0.4 mg Oral QPC breakfast  . warfarin  10 mg Oral ONCE-1800  .  Warfarin - Pharmacist Dosing Inpatient   Does not apply q1800   Continuous Infusions: . heparin 1,300 Units/hr (10/22/16 0600)     LOS: 11 days       Mauricio Gerome Apley, MD [facility] Pager [phone]  If 7PM-7AM, please contact night-coverage [email or web address] Password [email or web address] 10/22/2016, 11:33 AM

## 2016-10-22 NOTE — Progress Notes (Signed)
ANTICOAGULATION CONSULT NOTE - [place] for Heparin + warfarin Indication:  Afib, with recent GI bleed  Allergies  Allergen Reactions  . Ace Inhibitors Cough    Patient Measurements: Height: 5\' 11"  (180.3 cm) Weight: (P) 219 lb 11.2 oz (99.7 kg) IBW/kg (Calculated) : 75.3  Vital Signs: Temp: 97.6 F (36.4 C) (06/17 0537) Temp Source: Oral (06/17 0537) BP: 115/83 (06/17 0537) Pulse Rate: 98 (06/17 0537)  Labs:  Recent Labs  10/20/16 0540  10/21/16 8016 10/21/16 1548 10/22/16 0514  HGB 8.0*  --  8.6*  --  7.9*  HCT 26.1*  --  28.7*  --  26.0*  PLT 155  --  158  --  161  LABPROT 16.1*  --  15.9*  --  16.6*  INR 1.28  --  1.26  --  1.33  HEPARINUNFRC  --   < > 0.19* 0.30 <0.10*  CREATININE 2.03*  --  2.01*  --  2.01*  < > = values in this interval not displayed.  Estimated Creatinine Clearance: 40 mL/min (A) (by C-G formula based on SCr of 2.01 mg/dL (H)).   Assessment: 76 year old male on warfarin prior to admission admitted for GI bleed.  INR reversed with Kcentra on admission with plans to hold anti-coagulation for 7 days from 6/6.     Vascular surgery has seen, and will not intervene on AAA during this admission. Warfarin was restarted 6/14.  Heparin level down to undetectable on 1300 units/hr. Pt has pulled line out a few times overnight and RN just replaced and plans to restart heparin right now.  Home dose of warfarin 5mg  daily except 7.5mg  on Wednesdays. INR stable on this dose last two outpatient visits, most recently 5/2.   INR without much change to 1.33 today after 3 days of warfarin.  Hgb low but stable, plts stable. No bleeding noted. No new DDI. Good PO intake.  Goal of Therapy:  Heparin level 0.3-0.5 units/ml (due to recent bleed) INR 2-3 Monitor platelets by anticoagulation protocol: Yes   Plan:  -Restart heparin to 1300 units/hr -Will f/u 8hr heparin level past restart -Warfarin 10 mg x1 -Daily INR, heparin level,  and CBC daily  Sherlon Handing, PharmD, BCPS Clinical pharmacist, pager [phone] 10/22/2016 6:41 AM

## 2016-10-23 LAB — BASIC METABOLIC PANEL
Anion gap: 10 (ref 5–15)
BUN: 29 mg/dL — AB (ref 6–20)
CALCIUM: 8.7 mg/dL — AB (ref 8.9–10.3)
CO2: 29 mmol/L (ref 22–32)
CREATININE: 1.99 mg/dL — AB (ref 0.61–1.24)
Chloride: 101 mmol/L (ref 101–111)
GFR calc non Af Amer: 32 mL/min — ABNORMAL LOW (ref >60)
GFR, EST AFRICAN AMERICAN: 37 mL/min — AB (ref >60)
Glucose, Bld: 98 mg/dL (ref 65–99)
Potassium: 3.8 mmol/L (ref 3.5–5.1)
SODIUM: 140 mmol/L (ref 135–145)

## 2016-10-23 LAB — PROTIME-INR
INR: 1.48
Prothrombin Time: 18 seconds — ABNORMAL HIGH (ref 11.4–15.2)

## 2016-10-23 LAB — CBC
HEMATOCRIT: 27.7 % — AB (ref 39.0–52.0)
Hemoglobin: 8.5 g/dL — ABNORMAL LOW (ref 13.0–17.0)
MCH: 28.6 pg (ref 26.0–34.0)
MCHC: 30.7 g/dL (ref 30.0–36.0)
MCV: 93.3 fL (ref 78.0–100.0)
Platelets: 157 10*3/uL (ref 150–400)
RBC: 2.97 MIL/uL — ABNORMAL LOW (ref 4.22–5.81)
RDW: 18.2 % — AB (ref 11.5–15.5)
WBC: 4.4 10*3/uL (ref 4.0–10.5)

## 2016-10-23 LAB — HEPARIN LEVEL (UNFRACTIONATED)
HEPARIN UNFRACTIONATED: 0.36 [IU]/mL (ref 0.30–0.70)
Heparin Unfractionated: 0.64 IU/mL (ref 0.30–0.70)
Heparin Unfractionated: 0.5 IU/mL (ref 0.30–0.70)

## 2016-10-23 MED ORDER — WARFARIN SODIUM 7.5 MG PO TABS
7.5000 mg | ORAL_TABLET | Freq: Once | ORAL | Status: AC
  Administered 2016-10-23: 7.5 mg via ORAL
  Filled 2016-10-23: qty 1

## 2016-10-23 NOTE — Progress Notes (Signed)
ANTICOAGULATION CONSULT NOTE - [place] for Heparin Indication:  Afib, with recent GI bleed  Allergies  Allergen Reactions  . Ace Inhibitors Cough    Patient Measurements: Height: 5\' 11"  (180.3 cm) Weight: 216 lb 1.6 oz (98 kg) IBW/kg (Calculated) : 75.3  Vital Signs: Temp: 97.7 F (36.5 C) (06/18 0449) Temp Source: Oral (06/18 0449) BP: 125/92 (06/18 0449) Pulse Rate: 105 (06/18 0449)  Labs:  Recent Labs  10/21/16 7741  10/22/16 0514 10/22/16 1454 10/23/16 0100 10/23/16 0625 10/23/16 0857  HGB 8.6*  --  7.9*  --   --  8.5*  --   HCT 28.7*  --  26.0*  --   --  27.7*  --   PLT 158  --  161  --   --  157  --   LABPROT 15.9*  --  16.6*  --   --  18.0*  --   INR 1.26  --  1.33  --   --  1.48  --   HEPARINUNFRC 0.19*  < > <0.10* <0.10* 0.36  --  0.64  CREATININE 2.01*  --  2.01*  --   --  1.99*  --   < > = values in this interval not displayed.  Estimated Creatinine Clearance: 39.5 mL/min (A) (by C-G formula based on SCr of 1.99 mg/dL (H)).   Assessment: 76 year old male on warfarin prior to admission admitted for GI bleed.  INR reversed with Kcentra on admission with plans to hold anti-coagulation for 7 days from 6/6.     Vascular surgery has seen, and will not intervene on AAA during this admission. Warfarin restarted 6/14, continues on heparin bridge.  Heparin level is above goal at 0.64 on 1400 units/hr. Of note, heparin levels have been difficult to manage and have been both high and low. INR is subtherapeutic at 1.48. No bleeding noted, Hgb is low stable, platelets are stable.  PTA warfarin regimen: 5 mg daily exc 7.5 mg Wed  Goal of Therapy:  Heparin level 0.3-0.5 units/ml (due to recent bleed) INR 2-3 Monitor platelets by anticoagulation protocol: Yes   Plan:  -Decrease heparin drip to 1350 units/hr -Warfarin 7.5 mg PO tonight -6 hr heparin level -Daily heparin level, INR and CBC -Monitor for s/sx of bleeding   Renold Genta, PharmD, BCPS Clinical Pharmacist Phone for today - [place] - [phone] 10/23/2016 1:54 PM

## 2016-10-23 NOTE — Progress Notes (Signed)
ANTICOAGULATION CONSULT NOTE - [place] for Heparin Indication:  Afib, with recent GI bleed  Allergies  Allergen Reactions  . Ace Inhibitors Cough    Patient Measurements: Height: 5\' 11"  (180.3 cm) Weight: 219 lb 11.2 oz (99.7 kg) IBW/kg (Calculated) : 75.3  Vital Signs: Temp: 98.4 F (36.9 C) (06/17 2130) Temp Source: Oral (06/17 2130) BP: 120/83 (06/17 2130) Pulse Rate: 90 (06/17 2130)  Labs:  Recent Labs  10/20/16 0540  10/21/16 1478  10/22/16 0514 10/22/16 1454 10/23/16 0100  HGB 8.0*  --  8.6*  --  7.9*  --   --   HCT 26.1*  --  28.7*  --  26.0*  --   --   PLT 155  --  158  --  161  --   --   LABPROT 16.1*  --  15.9*  --  16.6*  --   --   INR 1.28  --  1.26  --  1.33  --   --   HEPARINUNFRC  --   < > 0.19*  < > <0.10* <0.10* 0.36  CREATININE 2.03*  --  2.01*  --  2.01*  --   --   < > = values in this interval not displayed.  Estimated Creatinine Clearance: 39.4 mL/min (A) (by C-G formula based on SCr of 2.01 mg/dL (H)).   Assessment: 76 year old male on warfarin prior to admission admitted for GI bleed.  INR reversed with Kcentra on admission with plans to hold anti-coagulation for 7 days from 6/6.     Vascular surgery has seen, and will not intervene on AAA during this admission. Warfarin was restarted 6/14.  Heparin now therapeutic (0.36) on 1300 units/hr. No bleeding noted.  Goal of Therapy:  Heparin level 0.3-0.5 units/ml (due to recent bleed) INR 2-3 Monitor platelets by anticoagulation protocol: Yes   Plan:  -Continue heparin at 1400 units/hr -Confirmatory heparin level in 6 hours  Sherlon Handing, PharmD, BCPS Clinical pharmacist, pager [phone] 10/23/2016 2:28 AM

## 2016-10-23 NOTE — Care Management Important Message (Signed)
Important Message  Patient Details  Name: Mark [name] MRN: [id number] Date of Birth: 1940/07/04   Medicare Important Message Given:  Yes    Nathen May 10/23/2016, 1:42 PM

## 2016-10-23 NOTE — Progress Notes (Signed)
ANTICOAGULATION CONSULT NOTE - [place] for Heparin Indication:  Afib, with recent GI bleed  Allergies  Allergen Reactions  . Ace Inhibitors Cough    Patient Measurements: Height: 5\' 11"  (180.3 cm) Weight: 216 lb 1.6 oz (98 kg) IBW/kg (Calculated) : 75.3   Vital Signs: Temp: 98.1 F (36.7 C) (06/18 1443) Temp Source: Oral (06/18 1443) BP: 113/62 (06/18 1443) Pulse Rate: 101 (06/18 1443)  Labs:  Recent Labs  10/21/16 0355  10/22/16 0514  10/23/16 0100 10/23/16 0625 10/23/16 0857 10/23/16 1805  HGB 8.6*  --  7.9*  --   --  8.5*  --   --   HCT 28.7*  --  26.0*  --   --  27.7*  --   --   PLT 158  --  161  --   --  157  --   --   LABPROT 15.9*  --  16.6*  --   --  18.0*  --   --   INR 1.26  --  1.33  --   --  1.48  --   --   HEPARINUNFRC 0.19*  < > <0.10*  < > 0.36  --  0.64 0.50  CREATININE 2.01*  --  2.01*  --   --  1.99*  --   --   < > = values in this interval not displayed.  Estimated Creatinine Clearance: 39.5 mL/min (A) (by C-G formula based on SCr of 1.99 mg/dL (H)).   Assessment: 76 year old male on warfarin prior to admission admitted for GI bleed.  INR reversed with Kcentra on admission with plans to hold anti-coagulation for 7 days from 6/6.     Vascular surgery has seen, and will not intervene on AAA during this admission. Warfarin restarted 6/14, continues on heparin bridge.  Heparin level is now therapeutic on 1350 units/hr.   PTA warfarin regimen: 5 mg daily exc 7.5 mg Wed  Goal of Therapy:  Heparin level 0.3-0.5 units/ml (due to recent bleed) INR 2-3 Monitor platelets by anticoagulation protocol: Yes   Plan:  Continue heparin drip at 1350 units/hr Daily heparin level, INR and CBC Monitor for s/sx of bleeding   [employer], Pharm.D., BCPS Clinical Pharmacist Pager: [phone] 10/23/2016 8:43 PM

## 2016-10-23 NOTE — Progress Notes (Signed)
PROGRESS NOTE   Mark [name]  NLZ:[id number]    DOB: [birth year]    DOA: 10/11/2016  PCP: [name], NP   I have briefly reviewed patients previous medical [facility] in [facility].  Brief Narrative:  76 year old male with history of AAA, atrial fibrillation on Coumadin anticoagulation, CAD, COPD, GERD, renal cell carcinoma status post nephrectomy, HTN and dementia presented with tachycardia and Malena. Patient was admitted and discharged on 10/07/16 for confusion with slurred speech, at which time an MRI of the brain was negative for acute CVA. Patient was seen by his PCP 6/6  for hospital follow-up, and noted to have a heart rate in the 150s. He was referred to [facility] [place] [facility] where he also reported 2-3 days of melena. In the ED, his heart rate was 140, guaiac positive, INR 2.3 and hemoglobin 8.3. Status post EGD. Hemoglobin stable over the last 4 days. Treating for decompensated CHF. Despite ongoing Rx for CHF, diuresis, reports DOE. Cardiology consulted for assistance on 6/15.Improving.   Assessment & Plan:   Principal Problem:   GI bleed Active Problems:   Hyperlipidemia   Depression   Essential hypertension   Coronary atherosclerosis   Abdominal aortic aneurysm (HCC)   Atrial fibrillation, chronic (HCC)   Asthma   Acute on chronic respiratory failure with hypoxia (HCC)   Acute systolic CHF (congestive heart failure) (HCC)   Pulmonary edema   1. Acute upper GI bleed, secondary to multiple duodenal ulcers: Status post EGD. H pylori screen negative. Continue twice a day PPI. Clinically resolved. VVS does not think he has a aorto enteric fistula. 2. Acute blood loss anemia: Hemoglobin dropped from 11.3-8.3 in 4 days. Transfused 3 units PRBCs thus far. Given history of CAD, keep hemoglobin at 8 g or higher. Hemoglobin stable in the 8 range. Follow 3. Acute on chronic systolic CHF: 2-D echo 01/08/39: LVEF 20-25 percent with diffuse hypokinesis. No adequate response with  home dose of Lasix 60 mg PO daily. IV Lasix initiated. Cardiology consulted. Increased Lasix to 40 mg 3 times a day. Significant volume overload, slowly improving. -3.2 L since admission. 4. Acute on chronic hypoxic respiratory failure: Patient on home oxygen 3 L/m continuously. Worsening dyspnea likely secondary to decompensated CHF with some contribution from anemia. Low index of suspicion for pneumonia. Pro-calcitonin <0.1. Also bilateral atelectasis and small pleural effusions on chest x-ray. Incentive spirometry. Monitor. 5. Stage III chronic kidney disease: Baseline creatinine 1.8. Monitor closely while on IV Lasix. Creatinine stable. 6. Permanent A. fib with RVR: Anticoagulation had been reversed on 10/11/16. Currently on IV heparin bridging and Coumadin. Reasonable rate control on carvedilol 12.5 MG BID. INR 1.48. 7. AAA: Ultrasound of aorta without evidence of entero-aortic fistula. No BRBPR noted during this hospital stay. MRA abdomen 6/12: Redemonstration of surgically treated infrarenal AAA with stent graft. Greatest diameter of the excluded aneurysm sac is relatively unchanged compared to most recent CT studies, approximately 9.1 cm. discussed with vascular surgery and their input appreciated: His AAA has remained stable over time and no intervention is currently recommended. Follow-up with vascular surgery in 6 months to with repeat ultrasound. Does not think that he has a aorto enteric fistula. There is no stranding on his MRI around the graft. 8. Hypokalemia: Replaced.  9. CAD status post PCI: Asymptomatic of chest pain. 10. Dementia: Advanced chronic small vessel disease noted on MRI 10/06/16 consistent with vascular dementia. Family reported progressive cognitive decline and feels that he is no longer safe to  live on his own. PT/OT suggest SNF placement and patient agreeable. DC to SNF when medically stable. 11. Hyperlipidemia: Statins. 12. Essential hypertension: Controlled. 13. BPH:  Continue Flomax. 14. Depression: Stable. Continue Zoloft. 15. Renal cell carcinoma, status post left nephrectomy. 16. COPD: Stable without clinical bronchospasm. 17. Thrombocytopenia, chronic: ? Secondary to acute blood loss. Resolved. 18. NSVT: 13 beat NSVT noted on 6/13 at 8:41 AM. Does not seem like A. fib with aberrancy. Possibly due to his cardiomyopathy. Continue monitoring on telemetry. No further episodes.   DVT prophylaxis: On IV heparin drip. Code Status: Full Family Communication: None at bedside Disposition: DC to SNF when medically stable, hopefully in the next 48 hours   Consultants:  [facility]  Procedures:  EGD  Antimicrobials:  None    Subjective: States that he continues to urinate well. On and off mild DOE. No chest pain reported. Breathing much better than a few days ago.  ROS: normal colored stools without blood or melena.  Objective:  Vitals:   10/22/16 2130 10/23/16 0300 10/23/16 0449 10/23/16 1443  BP: 120/83  (!) 125/92 113/62  Pulse: 90  (!) 105 (!) 101  Resp: 18  18 20   Temp: 98.4 F (36.9 C)  97.7 F (36.5 C) 98.1 F (36.7 C)  TempSrc: Oral  Oral Oral  SpO2: 97%  91% 100%  Weight:  98 kg (216 lb 1.6 oz)    Height:        Examination:  General exam: Pleasant elderly male sitting up in bed. Respiratory system: Improved breath sounds. Occasional basal crackles. Otherwise clear to auscultation. No increased work of breathing. Cardiovascular system: S1 and S2 heard, irregularly irregular. No JVD. 1+ pitting bilateral leg edema. Telemetry: A. fib with controlled ventricular rate/BBB morphology. No further NSVT. Gastrointestinal system: abdomen is nondistended, soft and nontender. Normal bowel sounds heard. Central nervous system: alert and oriented 2. No focal deficits.  Extremities: Symmetric 5 x 5 power. moves all limbs equally. Skin: No rashes, lesions or ulcers Psychiatry: Judgement and insight appears impaired. Mood &  affect appropriate.     Data Reviewed: I have personally reviewed following labs and imaging studies  CBC:  Recent Labs Lab 10/19/16 0354 10/20/16 0540 10/21/16 0632 10/22/16 0514 10/23/16 0625  WBC 4.4 4.3 4.2 4.0 4.4  HGB 8.2* 8.0* 8.6* 7.9* 8.5*  HCT 26.5* 26.1* 28.7* 26.0* 27.7*  MCV 93.3 94.2 96.6 94.2 93.3  PLT 142* 155 158 161 809   Basic Metabolic Panel:  Recent Labs Lab 10/19/16 1648 10/20/16 0540 10/20/16 1255 10/21/16 0632 10/22/16 0514 10/23/16 0625  NA 140 139  --  140 139 140  K 3.4* 3.3*  --  4.3 3.6 3.8  CL 109 108  --  104 104 101  CO2 22 26  --  28 28 29   GLUCOSE 81 88  --  107* 104* 98  BUN 26* 26*  --  28* 31* 29*  CREATININE 2.06* 2.03*  --  2.01* 2.01* 1.99*  CALCIUM 7.8* 8.2*  --  8.5* 8.3* 8.7*  MG 1.6*  --  1.8 2.0  --   --    Coagulation Profile:  Recent Labs Lab 10/18/16 0411 10/20/16 0540 10/21/16 0632 10/22/16 0514 10/23/16 0625  INR 1.25 1.28 1.26 1.33 1.48   Cardiac Enzymes: No results for input(s): CKTOTAL, CKMB, CKMBINDEX, TROPONINI in the last 168 hours.   No results found for this or any previous visit (from the past 240 hour(s)).  Radiology Studies: No results found.      Scheduled Meds: . atorvastatin  40 mg Oral Daily  . carvedilol  12.5 mg Oral BID WC  . feeding supplement (ENSURE ENLIVE)  237 mL Oral BID BM  . furosemide  40 mg Intravenous TID  . isosorbide dinitrate  10 mg Oral TID  . pantoprazole  40 mg Oral BID  . polyethylene glycol  17 g Oral Daily  . sertraline  100 mg Oral QPM  . sodium chloride flush  3 mL Intravenous Q12H  . tamsulosin  0.4 mg Oral QPC breakfast  . Warfarin - Pharmacist Dosing Inpatient   Does not apply q1800   Continuous Infusions: . heparin 1,350 Units/hr (10/23/16 1316)     LOS: 12 days     Radiah Lubinski, MD, FACP, FHM. [facility] Pager [phone]  If 7PM-7AM, please contact night-coverage [email or web address] Password [facility] 10/23/2016, 7:57 PM  ]

## 2016-10-23 NOTE — Progress Notes (Signed)
Physical Therapy Treatment Patient Details Name: Mark [name] MRN: [id number] DOB: 07/29/1940 Today's Date: 10/23/2016    History of Present Illness Pt admitted with GIB. PMH: Recent admission after being found down outside of his home with weakness and AMS, CVA, shingles, OA, migraines, HTN, COPD on 3L, CHF, Depression, CKD, CAD, AAA.    PT Comments    Pt able to increase ambulation distance, but continues to need min/guard A with tendency to drift during gait with o2 intact at 3 L/min. Con't to recommend SNF.   Follow Up Recommendations  SNF     Equipment Recommendations  Rolling walker with 5" wheels    Recommendations for Other Services       Precautions / Restrictions Precautions Precautions: Fall Restrictions Weight Bearing Restrictions: No    Mobility  Bed Mobility Overal bed mobility: Needs Assistance Bed Mobility: Supine to Sit     Supine to sit: Supervision     General bed mobility comments: S with HOB elevated  Transfers Overall transfer level: Needs assistance Equipment used: None Transfers: Sit to/from Stand Sit to Stand: Supervision         General transfer comment: S for safety with sit > stand  Ambulation/Gait Ambulation/Gait assistance: Min guard Ambulation Distance (Feet): 100 Feet (x2) Assistive device:  (IV pole) Gait Pattern/deviations: Step-through pattern;Drifts right/left (hips externally rotated)     General Gait Details: Amb with o2 intact with ER hips and tends to drift back and forth with gait, while pushing IV pole.   Stairs            Wheelchair Mobility    Modified Rankin (Stroke Patients Only)       Balance     Sitting balance-Leahy Scale: Good     Standing balance support: Single extremity supported Standing balance-Leahy Scale: Fair                              Cognition Arousal/Alertness: Awake/alert Behavior During Therapy: WFL for tasks assessed/performed Overall Cognitive  Status: No family/caregiver present to determine baseline cognitive functioning Area of Impairment: Safety/judgement;Memory                     Memory: Decreased short-term memory   Safety/Judgement: Decreased awareness of deficits            Exercises      General Comments General comments (skin integrity, edema, etc.): Pt tends to move slowly.  Pt able to remove old socks and don new socks at EOB      Pertinent Vitals/Pain Pain Assessment: No/denies pain    Home Living                      Prior Function            PT Goals (current goals can now be found in the care plan section) Acute Rehab PT Goals Patient Stated Goal: to return to independence PT Goal Formulation: With patient Time For Goal Achievement: 10/30/16 Potential to Achieve Goals: Good Progress towards PT goals: Progressing toward goals    Frequency    Min 3X/week      PT Plan Current plan remains appropriate    Co-evaluation              AM-PAC PT "6 Clicks" Daily Activity  Outcome Measure  Difficulty turning over in bed (including adjusting bedclothes, sheets and blankets)?: None Difficulty moving from lying  on back to sitting on the side of the bed? : None Difficulty sitting down on and standing up from a chair with arms (e.g., wheelchair, bedside commode, etc,.)?: A Little Help needed moving to and from a bed to chair (including a wheelchair)?: A Little Help needed walking in hospital room?: A Little Help needed climbing 3-5 steps with a railing? : A Little 6 Click Score: 20    End of Session Equipment Utilized During Treatment: Gait belt;Oxygen Activity Tolerance: Patient tolerated treatment well Patient left: in chair;with call bell/phone within reach;with chair alarm set   PT Visit Diagnosis: Unsteadiness on feet (R26.81);Other abnormalities of gait and mobility (R26.89)     Time: 2671-2458 PT Time Calculation (min) (ACUTE ONLY): 27 min  Charges:   $Gait Training: 23-37 mins                    G Codes:       [name], [name] Pager [phone] 10/23/2016    [name] 10/23/2016, 2:23 PM

## 2016-10-24 ENCOUNTER — Other Ambulatory Visit: Payer: Self-pay

## 2016-10-24 LAB — BASIC METABOLIC PANEL
Anion gap: 10 (ref 5–15)
BUN: 31 mg/dL — AB (ref 6–20)
CALCIUM: 8.7 mg/dL — AB (ref 8.9–10.3)
CHLORIDE: 101 mmol/L (ref 101–111)
CO2: 29 mmol/L (ref 22–32)
CREATININE: 1.87 mg/dL — AB (ref 0.61–1.24)
GFR calc Af Amer: 39 mL/min — ABNORMAL LOW (ref >60)
GFR calc non Af Amer: 34 mL/min — ABNORMAL LOW (ref >60)
Glucose, Bld: 92 mg/dL (ref 65–99)
Potassium: 3.9 mmol/L (ref 3.5–5.1)
SODIUM: 140 mmol/L (ref 135–145)

## 2016-10-24 LAB — CBC
HCT: 27.5 % — ABNORMAL LOW (ref 39.0–52.0)
Hemoglobin: 8.5 g/dL — ABNORMAL LOW (ref 13.0–17.0)
MCH: 29.3 pg (ref 26.0–34.0)
MCHC: 30.9 g/dL (ref 30.0–36.0)
MCV: 94.8 fL (ref 78.0–100.0)
PLATELETS: 155 10*3/uL (ref 150–400)
RBC: 2.9 MIL/uL — ABNORMAL LOW (ref 4.22–5.81)
RDW: 18.4 % — ABNORMAL HIGH (ref 11.5–15.5)
WBC: 4.3 10*3/uL (ref 4.0–10.5)

## 2016-10-24 LAB — PROTIME-INR
INR: 1.77
Prothrombin Time: 20.8 seconds — ABNORMAL HIGH (ref 11.4–15.2)

## 2016-10-24 LAB — HEPARIN LEVEL (UNFRACTIONATED): Heparin Unfractionated: 0.51 IU/mL (ref 0.30–0.70)

## 2016-10-24 MED ORDER — HEPARIN (PORCINE) IN NACL 100-0.45 UNIT/ML-% IJ SOLN
1300.0000 [IU]/h | INTRAMUSCULAR | Status: DC
  Administered 2016-10-25: 1300 [IU]/h via INTRAVENOUS
  Filled 2016-10-24: qty 250

## 2016-10-24 MED ORDER — WARFARIN SODIUM 7.5 MG PO TABS
7.5000 mg | ORAL_TABLET | Freq: Once | ORAL | Status: AC
  Administered 2016-10-24: 7.5 mg via ORAL
  Filled 2016-10-24: qty 1

## 2016-10-24 MED ORDER — METOLAZONE 2.5 MG PO TABS
2.5000 mg | ORAL_TABLET | Freq: Every day | ORAL | Status: DC
  Administered 2016-10-24 – 2016-10-26 (×3): 2.5 mg via ORAL
  Filled 2016-10-24 (×4): qty 1

## 2016-10-24 MED ORDER — IVABRADINE HCL 5 MG PO TABS
5.0000 mg | ORAL_TABLET | Freq: Two times a day (BID) | ORAL | Status: DC
  Administered 2016-10-24 – 2016-10-25 (×2): 5 mg via ORAL
  Filled 2016-10-24 (×4): qty 1

## 2016-10-24 NOTE — Progress Notes (Signed)
Progress Note  Patient Name: Mark [name] Date of Encounter: 10/24/2016  Primary Cardiologist: Dr. Stanford Breed  Subjective   The patient continues to improve and his breathing although he is not back to normal. No chest pain.  Inpatient Medications    Scheduled Meds: . atorvastatin  40 mg Oral Daily  . carvedilol  12.5 mg Oral BID WC  . feeding supplement (ENSURE ENLIVE)  237 mL Oral BID BM  . furosemide  40 mg Intravenous TID  . isosorbide dinitrate  10 mg Oral TID  . pantoprazole  40 mg Oral BID  . polyethylene glycol  17 g Oral Daily  . sertraline  100 mg Oral QPM  . sodium chloride flush  3 mL Intravenous Q12H  . tamsulosin  0.4 mg Oral QPC breakfast  . Warfarin - Pharmacist Dosing Inpatient   Does not apply q1800   Continuous Infusions: . heparin 1,350 Units/hr (10/23/16 1316)   PRN Meds: acetaminophen, levalbuterol, ondansetron **OR** ondansetron (ZOFRAN) IV   Vital Signs    Vitals:   10/23/16 0449 10/23/16 1443 10/23/16 2319 10/24/16 0539  BP: (!) 125/92 113/62 111/88 119/88  Pulse: (!) 105 (!) 101 (!) 101 (!) 113  Resp: 18 20  18   Temp: 97.7 F (36.5 C) 98.1 F (36.7 C) 98 F (36.7 C) 98.1 F (36.7 C)  TempSrc: Oral Oral Oral Oral  SpO2: 91% 100% 97% 90%  Weight:    218 lb 6.4 oz (99.1 kg)  Height:        Intake/Output Summary (Last 24 hours) at 10/24/16 1103 Last data filed at 10/24/16 0949  Gross per 24 hour  Intake           662.68 ml  Output              500 ml  Net           162.68 ml   Filed Weights   10/22/16 0529 10/23/16 0300 10/24/16 0539  Weight: 219 lb 11.2 oz (99.7 kg) 216 lb 1.6 oz (98 kg) 218 lb 6.4 oz (99.1 kg)    Telemetry    Atrial fibrillation with rates mostly in the 90s to low 100s and occasionally up to 120s - Personally Reviewed  ECG    No new tracings  Physical Exam   GEN: No acute distress.   Neck: No JVD Cardiac: RRR, no murmurs, rubs, or gallops.  Respiratory: Bibasilar crackles GI: Soft, nontender,  non-distended  MS:  1+ pitting lower extremity edema; No deformity. Neuro:  Nonfocal  Psych: Normal affect   Labs    Chemistry Recent Labs Lab 10/22/16 0514 10/23/16 0625 10/24/16 0536  NA 139 140 140  K 3.6 3.8 3.9  CL 104 101 101  CO2 28 29 29   GLUCOSE 104* 98 92  BUN 31* 29* 31*  CREATININE 2.01* 1.99* 1.87*  CALCIUM 8.3* 8.7* 8.7*  GFRNONAA 31* 32* 34*  GFRAA 36* 37* 39*  ANIONGAP 7 10 10      Hematology Recent Labs Lab 10/22/16 0514 10/23/16 0625 10/24/16 0536  WBC 4.0 4.4 4.3  RBC 2.76* 2.97* 2.90*  HGB 7.9* 8.5* 8.5*  HCT 26.0* 27.7* 27.5*  MCV 94.2 93.3 94.8  MCH 28.6 28.6 29.3  MCHC 30.4 30.7 30.9  RDW 18.1* 18.2* 18.4*  PLT 161 157 155    Cardiac EnzymesNo results for input(s): TROPONINI in the last 168 hours. No results for input(s): TROPIPOC in the last 168 hours.   BNPNo results for input(s): BNP,  PROBNP in the last 168 hours.   DDimer No results for input(s): DDIMER in the last 168 hours.   Radiology    No results found.  Cardiac Studies   Echocardiogram 08/15/16 Study Conclusions  - Left ventricle: The cavity size was mildly dilated. Wall   thickness was increased in a pattern of mild LVH. There was   moderate focal basal hypertrophy of the septum. Systolic function   was severely reduced. The estimated ejection fraction was in the   range of 20% to 25%. Diffuse hypokinesis. The study is not   technically sufficient to allow evaluation of LV diastolic   function. - Aortic valve: There was mild regurgitation. - Aortic root: The aortic root was mildly dilated. - Ascending aorta: The ascending aorta was mildly dilated. - Mitral valve: There was moderate regurgitation. - Left atrium: The atrium was severely dilated. - Right ventricle: The cavity size was mildly dilated. - Right atrium: The atrium was moderately dilated.  Impressions:  - Severe LV systolic dysfunction; mild AI; mildly dilated aortic   root (4.6 cm; suggest CTA or  MRA to further assess); moderate MR;   biatrial enlargement; mild RVE; mild TR.  Patient Profile     76 y.o. male patient of Dr. Jacalyn Lefevre with history of CAD status post PCI of LAD and first diagonal branch back in 2006, chronic afib on coumadin, HTN, LV dysfunction with EF of 20-25% by echo in 08/2016, AAA status post endoluminal stent grafting 2011 admitted for melena on 10/11/16. Hemoglobin was 7.2. An EGD on 69 did not show any source of bleeding. His Coumadin was discontinued and now heparin has been resumed to do stability of hemoglobin. Cardiology was asked to see on 6/15 for decompensated heart failure. Diuresis was intensified.   Assessment & Plan    Acute on chronic systolic heart failure -Echo in 08/2016 showed EF 20-25% with diffuse hypokinesis. Home regimen included Lasix 60 mg by mouth daily -Cardiology consulted on 6/15 for volume overload/CHF in setting of anemia and GI bleed. -Patient is on Coreg 12.5 mg twice a day, Isordil 10 mg 3 times a day and has been receiving Lasix 40 mg IV 3 times a day -Maximum weight during this hospitalization was 226 pounds (inconsistent wts) on 6/16 at which time diuresis was intensified, current weight is 218 pounds (up 2 pounds from yesterday) -Fluid status is -3 L since admission, -177 mL of the last 24 hours -Patient has improvement in his symptoms however he still has shortness of breath with exertion that has improved but not yet back to normal, lower extremity edema, and crackles in the lung bases -Continue with IV Lasix for at least another day  Anemia/GI bleed -Being managed by internal medicine - EGD on 6/7 showed Multiple nonbleeding duodenal ulcers and no source of active bleeding -Coumadin was held for a time however hemoglobin appears to be stable in the range of 8 and heparin has been resumed  CKD stage III -Baseline creatinine 1.8 -Max creatinine 2.06 and has slowly been trending downward during diuresis and is 1.87  today  Chronic atrial fibrillation -Rate controlled on carvedilol 12.5 mg twice a day -Anticoagulation for stroke risk reduction. Patient currently on heparin drip and Coumadin has been resumed with an INR of 1.77 today, being managed by pharmacy.  Signed, Daune Perch, NP  10/24/2016, 11:03 AM    The patient was seen, examined and discussed with Daune Perch, NP-C and I agree with the above.   The patient  continues to be fluid overloaded despite iv lasix fluid balance positive and weight is up, I would add metolazone 2.5 mg po daily.  Also he is tachycardic, consider transfusion to bring Hg > 9-10 g, we are unable to increase carvedilol as his BP is low. I would add ivabradin 5 mg po BID for rate control in LVEF < 35%.   Ena Dawley, MD 10/24/2016

## 2016-10-24 NOTE — Telephone Encounter (Signed)
Letter mailed to the patient

## 2016-10-24 NOTE — Progress Notes (Signed)
PROGRESS NOTE   AQUARIUS LATOUCHE  KNL:[id number]    DOB: 1941-02-24    DOA: 10/11/2016  PCP: [name], NP   I have briefly reviewed patients previous medical records in [facility].  Brief Narrative:  76 year old male with history of AAA, atrial fibrillation on Coumadin anticoagulation, CAD, COPD, GERD, renal cell carcinoma status post nephrectomy, HTN and dementia presented with tachycardia and Malena. Patient was admitted and discharged on 10/07/16 for confusion with slurred speech, at which time an MRI of the brain was negative for acute CVA. Patient was seen by his PCP 6/6  for hospital follow-up, and noted to have a heart rate in the 150s. He was referred to [facility] where he also reported 2-3 days of melena. In the ED, his heart rate was 140, guaiac positive, INR 2.3 and hemoglobin 8.3. Status post EGD. Hemoglobin stable over the last 4 days. Treating for decompensated CHF. Despite ongoing Rx for CHF, diuresis, reports DOE. Cardiology consulted for assistance on 6/15.Improving.   Assessment & Plan:   Principal Problem:   GI bleed Active Problems:   Hyperlipidemia   Depression   Essential hypertension   Coronary atherosclerosis   Abdominal aortic aneurysm (HCC)   Atrial fibrillation, chronic (HCC)   Asthma   Acute on chronic respiratory failure with hypoxia (HCC)   Acute systolic CHF (congestive heart failure) (HCC)   Pulmonary edema   1. Acute upper GI bleed, secondary to multiple duodenal ulcers: Status post EGD. H pylori screen negative. Continue twice a day PPI. Clinically resolved. [facility] does not think he has a aorto enteric fistula. 2. Acute blood loss anemia: Hemoglobin dropped from 11.3-8.3 in 4 days. Transfused 3 units PRBCs thus far. Given history of CAD, keep hemoglobin at 8 g or higher. Hemoglobin stable in the 8 range. Follow 3. Acute on chronic systolic CHF: 2-D echo 7/90/24: LVEF 20-25 percent with diffuse hypokinesis. No adequate response with  home dose of Lasix 60 mg PO daily. IV Lasix initiated. Cardiology consulted. Increased Lasix to 40 mg 3 times a day. Only -1 77 mL over last 24 hours, patient was educated about 1.5 L fluid restriction, will need another 24 hours of IV Lasix per cardiology . 4. Acute on chronic hypoxic respiratory failure: Patient on home oxygen 3 L/m continuously. Worsening dyspnea likely secondary to decompensated CHF with some contribution from anemia. Low index of suspicion for pneumonia. Pro-calcitonin <0.1. Also bilateral atelectasis and small pleural effusions on chest x-ray. Incentive spirometry. Monitor. 5. Stage III chronic kidney disease: Baseline creatinine 1.8. Monitor closely while on IV Lasix. Creatinine stable. 6. Permanent A. fib with RVR: Anticoagulation had been reversed on 10/11/16. Currently on IV heparin bridging and Coumadin. Reasonable rate control on carvedilol 12.5 MG BID. INR 1.77 7. AAA: Ultrasound of aorta without evidence of entero-aortic fistula. No BRBPR noted during this hospital stay. MRA abdomen 6/12: Redemonstration of surgically treated infrarenal AAA with stent graft. Greatest diameter of the excluded aneurysm sac is relatively unchanged compared to most recent CT studies, approximately 9.1 cm. discussed with vascular surgery and their input appreciated: His AAA has remained stable over time and no intervention is currently recommended. Follow-up with vascular surgery in 6 months to with repeat ultrasound. Does not think that he has a aorto enteric fistula. There is no stranding on his MRI around the graft. 8. Hypokalemia: Replaced.  9. CAD status post PCI: Asymptomatic of chest pain. 10. Dementia: Advanced chronic small vessel disease noted on MRI 10/06/16  consistent with vascular dementia. Family reported progressive cognitive decline and feels that he is no longer safe to live on his own. PT/OT suggest SNF placement and patient agreeable. DC to SNF when medically  stable. 11. Hyperlipidemia: Statins. 12. Essential hypertension: Controlled. 13. BPH: Continue Flomax. 14. Depression: Stable. Continue Zoloft. 15. Renal cell carcinoma, status post left nephrectomy. 16. COPD: Stable without clinical bronchospasm. 17. Thrombocytopenia, chronic: ? Secondary to acute blood loss. Resolved. 18. NSVT: 13 beat NSVT noted on 6/13 at 8:41 AM. Does not seem like A. fib with aberrancy. Possibly due to his cardiomyopathy. Continue monitoring on telemetry. No further episodes.   DVT prophylaxis: On IV heparin drip. Code Status: Full Family Communication: None at bedside Disposition: DC to SNF when medically stable, hopefully in the next 48 hours   Consultants:  [facility]  Procedures:  EGD  Antimicrobials:  None    Subjective: Reports he is feeling better, denies any complaints, no chest pain, no shortness of breath, reports some chronic intermittent cough, nonproductive   Objective:  Vitals:   10/23/16 0449 10/23/16 1443 10/23/16 2319 10/24/16 0539  BP: (!) 125/92 113/62 111/88 119/88  Pulse: (!) 105 (!) 101 (!) 101 (!) 113  Resp: 18 20  18   Temp: 97.7 F (36.5 C) 98.1 F (36.7 C) 98 F (36.7 C) 98.1 F (36.7 C)  TempSrc: Oral Oral Oral Oral  SpO2: 91% 100% 97% 90%  Weight:    99.1 kg (218 lb 6.4 oz)  Height:        Examination:  General exam: Pleasant elderly male sitting up in bed. Respiratory system: Good air entry bilaterally, diminished at the bases, occasional crackles, negative accessory muscle . Cardiovascular system: S1 and S2 heard, irregularly irregular. No JVD. No edema. Telemetry: A. fib with controlled ventricular rate/BBB morphology. No further NSVT. Gastrointestinal system: Abdomen soft, nontender, nondistended, bowel sounds present  Central nervous system: alert and oriented 2. No focal deficits.  Extremities: Symmetric 5 x 5 power. moves all limbs equally. Skin: No rashes, lesions or ulcers Psychiatry:  Judgement and insight appears impaired. Mood & affect appropriate.     Data Reviewed: I have personally reviewed following labs and imaging studies  CBC:  Recent Labs Lab 10/20/16 0540 10/21/16 0632 10/22/16 0514 10/23/16 0625 10/24/16 0536  WBC 4.3 4.2 4.0 4.4 4.3  HGB 8.0* 8.6* 7.9* 8.5* 8.5*  HCT 26.1* 28.7* 26.0* 27.7* 27.5*  MCV 94.2 96.6 94.2 93.3 94.8  PLT 155 158 161 157 401   Basic Metabolic Panel:  Recent Labs Lab 10/19/16 1648 10/20/16 0540 10/20/16 1255 10/21/16 0632 10/22/16 0514 10/23/16 0625 10/24/16 0536  NA 140 139  --  140 139 140 140  K 3.4* 3.3*  --  4.3 3.6 3.8 3.9  CL 109 108  --  104 104 101 101  CO2 22 26  --  28 28 29 29   GLUCOSE 81 88  --  107* 104* 98 92  BUN 26* 26*  --  28* 31* 29* 31*  CREATININE 2.06* 2.03*  --  2.01* 2.01* 1.99* 1.87*  CALCIUM 7.8* 8.2*  --  8.5* 8.3* 8.7* 8.7*  MG 1.6*  --  1.8 2.0  --   --   --    Coagulation Profile:  Recent Labs Lab 10/20/16 0540 10/21/16 0632 10/22/16 0514 10/23/16 0625 10/24/16 0536  INR 1.28 1.26 1.33 1.48 1.77   Cardiac Enzymes: No results for input(s): CKTOTAL, CKMB, CKMBINDEX, TROPONINI in the last 168 hours.  No results found for this or any previous visit (from the past 240 hour(s)).       Radiology Studies: No results found.      Scheduled Meds: . atorvastatin  40 mg Oral Daily  . carvedilol  12.5 mg Oral BID WC  . feeding supplement (ENSURE ENLIVE)  237 mL Oral BID BM  . furosemide  40 mg Intravenous TID  . isosorbide dinitrate  10 mg Oral TID  . pantoprazole  40 mg Oral BID  . polyethylene glycol  17 g Oral Daily  . sertraline  100 mg Oral QPM  . sodium chloride flush  3 mL Intravenous Q12H  . tamsulosin  0.4 mg Oral QPC breakfast  . Warfarin - Pharmacist Dosing Inpatient   Does not apply q1800   Continuous Infusions: . heparin 1,350 Units/hr (10/23/16 1316)     LOS: 13 days     ELGERGAWY, DAWOOD, MD [facility] Pager [phone]  If  7PM-7AM, please contact night-coverage [email or web address] Password [email or web address] 10/24/2016, 1:22 PM ]

## 2016-10-24 NOTE — Progress Notes (Signed)
ANTICOAGULATION CONSULT NOTE - [place] for Heparin/Coumadin Indication:  Afib, with recent GI bleed  Allergies  Allergen Reactions  . Ace Inhibitors Cough    Patient Measurements: Height: 5\' 11"  (180.3 cm) Weight: 218 lb 6.4 oz (99.1 kg) IBW/kg (Calculated) : 75.3   Vital Signs: Temp: 98.1 F (36.7 C) (06/19 0539) Temp Source: Oral (06/19 0539) BP: 119/88 (06/19 0539) Pulse Rate: 113 (06/19 0539)  Labs:  Recent Labs  10/22/16 0514  10/23/16 0625 10/23/16 0857 10/23/16 1805 10/24/16 0536  HGB 7.9*  --  8.5*  --   --  8.5*  HCT 26.0*  --  27.7*  --   --  27.5*  PLT 161  --  157  --   --  155  LABPROT 16.6*  --  18.0*  --   --  20.8*  INR 1.33  --  1.48  --   --  1.77  HEPARINUNFRC <0.10*  < >  --  0.64 0.50 0.51  CREATININE 2.01*  --  1.99*  --   --  1.87*  < > = values in this interval not displayed.  Estimated Creatinine Clearance: 42.2 mL/min (A) (by C-G formula based on SCr of 1.87 mg/dL (H)).   Assessment: 76 year old male on warfarin for Afib prior to admission admitted for GI bleed.  INR reversed with Kcentra on admission with plans to hold anti-coagulation for 7 days from 6/6.   IV heparin drip started on 10/17/16.   Vascular surgery has seen patient and will not intervene on AAA during this admission. Warfarin restarted 6/14, continues on heparin bridge.  INR is 1.77, increased from 1.48 yesterday, toward INR  goal 2-3 Heparin level remains therapeutic, HL = 0.51  on 1350 units/hr IV heparin drip Hgb stable/low at 8.5 and pltc wnl at 155k. No bleeding reported.     PTA warfarin regimen: 5 mg daily exc 7.5 mg Wed; INR on admit date 10/11/16 was 2.24, therapeutic.   Goal of Therapy:  Heparin level 0.3-0.5 units/ml (due to recent bleed) INR 2-3 Monitor platelets by anticoagulation protocol: Yes   Plan:  Decrease IV heparin drip at 1300 units/hr to keep HL within 0.3-0.5 units/ml.  Give coumadin 7.5 mg today x1.  Daily heparin  level, INR and CBC Monitor for s/sx of bleeding  Nicole Cella, RPh Clinical Pharmacist Pager: [phone]-4P [phone] #[id number] [phone] 10/24/2016 1:15 PM

## 2016-10-25 LAB — CBC
HCT: 29.3 % — ABNORMAL LOW (ref 39.0–52.0)
HEMOGLOBIN: 8.9 g/dL — AB (ref 13.0–17.0)
MCH: 28.3 pg (ref 26.0–34.0)
MCHC: 30.4 g/dL (ref 30.0–36.0)
MCV: 93.3 fL (ref 78.0–100.0)
PLATELETS: 150 10*3/uL (ref 150–400)
RBC: 3.14 MIL/uL — AB (ref 4.22–5.81)
RDW: 17.8 % — ABNORMAL HIGH (ref 11.5–15.5)
WBC: 4.3 10*3/uL (ref 4.0–10.5)

## 2016-10-25 LAB — PROTIME-INR
INR: 1.96
PROTHROMBIN TIME: 22.6 s — AB (ref 11.4–15.2)

## 2016-10-25 LAB — HEPARIN LEVEL (UNFRACTIONATED): HEPARIN UNFRACTIONATED: 0.3 [IU]/mL (ref 0.30–0.70)

## 2016-10-25 MED ORDER — DIGOXIN 125 MCG PO TABS: 0.0625 mg | ORAL_TABLET | Freq: Every day | ORAL | Status: DC

## 2016-10-25 MED ORDER — POTASSIUM CHLORIDE CRYS ER 20 MEQ PO TBCR
40.0000 meq | EXTENDED_RELEASE_TABLET | Freq: Once | ORAL | Status: AC
  Administered 2016-10-25: 40 meq via ORAL
  Filled 2016-10-25: qty 2

## 2016-10-25 MED ORDER — WARFARIN SODIUM 5 MG PO TABS
5.0000 mg | ORAL_TABLET | Freq: Once | ORAL | Status: AC
  Administered 2016-10-25: 5 mg via ORAL
  Filled 2016-10-25: qty 1

## 2016-10-25 MED ORDER — DIGOXIN 125 MCG PO TABS: 0.1250 mg | ORAL_TABLET | Freq: Every day | ORAL | Status: DC

## 2016-10-25 MED ORDER — DIGOXIN 125 MCG PO TABS
0.1250 mg | ORAL_TABLET | Freq: Every day | ORAL | Status: DC
  Administered 2016-10-25 – 2016-10-26 (×2): 0.125 mg via ORAL
  Filled 2016-10-25 (×2): qty 1

## 2016-10-25 NOTE — Progress Notes (Signed)
Nutrition Follow-up  DOCUMENTATION CODES:   Not applicable  INTERVENTION:   -Continue Ensure Enlive po BID, each supplement provides 350 kcal and 20 grams of protein  NUTRITION DIAGNOSIS:   Predicted suboptimal nutrient intake related to poor appetite as evidenced by percent weight loss.  Ongoing  GOAL:   Patient will meet greater than or equal to 90% of their needs  Met  MONITOR:   PO intake, Supplement acceptance, Diet advancement, Labs, Weight trends, Skin, I & O's  REASON FOR ASSESSMENT:   Malnutrition Screening Tool    ASSESSMENT:   76 year old male with history of AAA, atrial fibrillation on Coumadin, coronary artery disease, COPD, gastroesophageal reflux disease, renal cell carcinoma status post nephrectomy, hypertension, dementia, who presents with tachycardia and melena.  Pt sleeping soundly at time of visit.   Appetite continues to be great. Meal completion 75-100%. Pt also accepting supplements.   Per MD notes, plan to d/c to SNF once medically stable.   Labs reviewed.   Diet Order:  Diet Heart Room service appropriate? Yes; Fluid consistency: Thin; Fluid restriction: 1500 mL Fluid  Skin:  Reviewed, no issues  Last BM:  10/24/16  Height:   Ht Readings from Last 1 Encounters:  10/18/16 _0  (1.803 m)    Weight:   Wt Readings from Last 1 Encounters:  10/25/16 212 lb (96.2 kg)    Ideal Body Weight:  78.2 kg  BMI:  Body mass index is 29.57 kg/m.  Estimated Nutritional Needs:   Kcal:  1700-1900  Protein:  95-110 grams  Fluid:  1.7-1.9 L  EDUCATION NEEDS:   Education needs addressed  Theora Vankirk A. Jimmye Norman, RD, LDN, CDE Pager: [phone] After hours Pager: [phone]

## 2016-10-25 NOTE — Discharge Instructions (Signed)

## 2016-10-25 NOTE — Progress Notes (Signed)
PROGRESS NOTE   Mark [name]  NWG:[id number]    DOB: 1941-03-18    DOA: 10/11/2016  PCP: [name], NP   I have briefly reviewed patients previous medical records in [facility].  Brief Narrative:  76 year old male with history of AAA, atrial fibrillation on Coumadin anticoagulation, CAD, COPD, GERD, renal cell carcinoma status post nephrectomy, HTN and dementia presented with tachycardia and Malena. Patient was admitted and discharged on 10/07/16 for confusion with slurred speech, at which time an MRI of the brain was negative for acute CVA. Patient was seen by his PCP 6/6  for hospital follow-up, and noted to have a heart rate in the 150s. He was referred to [facility] where he also reported 2-3 days of melena. In the ED, his heart rate was 140, guaiac positive, INR 2.3 and hemoglobin 8.3. Status post EGD. Hemoglobin stable over the last 4 days. Treating for decompensated CHF. Despite ongoing Rx for CHF, diuresis, reports DOE. Cardiology consulted for assistance on 6/15.Improving.   Assessment & Plan:   Principal Problem:   GI bleed Active Problems:   Hyperlipidemia   Depression   Essential hypertension   Coronary atherosclerosis   Abdominal aortic aneurysm (HCC)   Atrial fibrillation, chronic (HCC)   Asthma   Acute on chronic respiratory failure with hypoxia (HCC)   Acute systolic CHF (congestive heart failure) (HCC)   Pulmonary edema  Acute upper GI bleed, secondary to multiple duodenal ulcers:  - Status post EGD. H pylori screen negative. Continue twice a day PPI. Clinically resolved. VVS does not think he has a aorto enteric fistula.  Acute blood loss anemia:  - Hemoglobin dropped from 11.3-8.3 in 4 days. Transfused 3 units PRBCs thus far. Given history of CAD, keep hemoglobin at 8 g or higher. Hemoglobin stable in the 8 range. Follow  Acute on chronic systolic CHF:  - 2-D echo 08/15/16: LVEF 20-25 percent with diffuse hypokinesis. No adequate response  with home dose of Lasix 60 mg PO daily. IV Lasix initiated. Diuresis per cardiology, still with significant volume overload, so started on metolazone 6/19 with quick response, 1.5 L over last 24 hours , remains on Lasix 40 mg IV 3 times a day, diuresis per cardiology, reinforced fluid restriction again with patient today  - Continue with beta blockers, none ACEI/ARB candidate given her renal failure, would consider Aldactone  Acute on chronic hypoxic respiratory failure:  - Patient on home oxygen 3 L/m continuously. Worsening dyspnea likely secondary to decompensated CHF with some contribution from anemia. Low index of suspicion for pneumonia. Pro-calcitonin <0.1. Also bilateral atelectasis and small pleural effusions on chest x-ray. Incentive spirometry. Monitor.  Stage III chronic kidney disease:  - Baseline creatinine 1.8. Monitor closely while on IV Lasix. Creatinine stable.  Permanent A. fib with RVR:  - Anticoagulation had been reversed on 10/11/16. On back on warfarin with heparin bridging, INR is 1.96 today, will stop heparin GTT. - Patient on Coreg 12.5 mg twice a day for heart rate control, but he remains with uncontrolled heart rate, was started on Ivabradin given soft blood pressure  AAA:  - Ultrasound of aorta without evidence of entero-aortic fistula. No BRBPR noted during this hospital stay. MRabdomen 6/12: Redemonstration of surgically treated infrarenal AAA with stent graft. Greatest diameter of the excluded aneurysm sac is relatively unchanged compared to most recent CT studies, approximately 9.1 cm. discussed with vascular surgery and their input appreciated: His AAA has remained stable over time and no intervention  is currently recommended. Follow-up with vascular surgery in 6 months to with repeat ultrasound. Does not think that he has a aorto enteric fistula. There is no stranding on his MRI around the graft.  Hypokalemia:  - repleted, continue to monitor closely as on IV  diuresis  CAD status post PCI:  - Asymptomatic of chest pain.  Dementia:  - Advanced chronic small vessel disease noted on MRI 10/06/16 consistent with vascular dementia. Family reported progressive cognitive decline and feels that he is no longer safe to live on his own. PT/OT suggest SNF placement and patient agreeable. DC to SNF when medically stable.  Hyperlipidemia:  - Statins.  Essential hypertension:  - Controlled.  BPH:  - Continue Flomax.  Depression:  - Stable. Continue Zoloft.  Renal cell carcinoma, status post left nephrectomy.  COPD:  - Stable without clinical bronchospasm.  Thrombocytopenia, chronic:  - ? Secondary to acute blood loss. Resolved.  NSVT:  - 13 beat NSVT noted on 6/13 at 8:41 AM. Does not seem like A. fib with aberrancy. Possibly due to his cardiomyopathy. Continue monitoring on telemetry. No further episodes.   DVT prophylaxis: On warfarin . Code Status: Full Family Communication: None at bedside Disposition: DC to SNF when medically stable, hopefully in the next 48 hours   Consultants:  [facility]  Procedures:  EGD  Antimicrobials:  None    Subjective: Reports he is feeling better, denies any complaints, no chest pain, no shortness of breath, reports some chronic intermittent cough, nonproductive   Objective:  Vitals:   10/24/16 1418 10/24/16 2220 10/25/16 0638 10/25/16 0645  BP: 106/65 100/65  114/87  Pulse: 92 76  100  Resp: 18 18  18   Temp: 98.4 F (36.9 C) 97.8 F (36.6 C)  97.4 F (36.3 C)  TempSrc:      SpO2: 98% 90%  90%  Weight:   96.2 kg (212 lb)   Height:        Examination:  General exam: Pleasant elderly male sitting up in bed. Respiratory system: Good air entry bilaterally, diminished at the bases, occasional crackles, negative accessory muscle . Cardiovascular system: S1 and S2 heard, irregularly irregular. No JVD. No edema. Telemetry: A. fib with controlled ventricular rate/BBB morphology.  No further NSVT. Gastrointestinal system: Abdomen soft, nontender, nondistended, bowel sounds present  Central nervous system: alert and oriented 2. No focal deficits.  Extremities: Symmetric 5 x 5 power. moves all limbs equally. Skin: No rashes, lesions or ulcers Psychiatry: Judgement and insight appears impaired. Mood & affect appropriate.     Data Reviewed: I have personally reviewed following labs and imaging studies  CBC:  Recent Labs Lab 10/21/16 0632 10/22/16 0514 10/23/16 0625 10/24/16 0536 10/25/16 0853  WBC 4.2 4.0 4.4 4.3 4.3  HGB 8.6* 7.9* 8.5* 8.5* 8.9*  HCT 28.7* 26.0* 27.7* 27.5* 29.3*  MCV 96.6 94.2 93.3 94.8 93.3  PLT 158 161 157 155 211   Basic Metabolic Panel:  Recent Labs Lab 10/19/16 1648 10/20/16 0540 10/20/16 1255 10/21/16 0632 10/22/16 0514 10/23/16 0625 10/24/16 0536  NA 140 139  --  140 139 140 140  K 3.4* 3.3*  --  4.3 3.6 3.8 3.9  CL 109 108  --  104 104 101 101  CO2 22 26  --  28 28 29 29   GLUCOSE 81 88  --  107* 104* 98 92  BUN 26* 26*  --  28* 31* 29* 31*  CREATININE 2.06* 2.03*  --  2.01* 2.01*  1.99* 1.87*  CALCIUM 7.8* 8.2*  --  8.5* 8.3* 8.7* 8.7*  MG 1.6*  --  1.8 2.0  --   --   --    Coagulation Profile:  Recent Labs Lab 10/21/16 0632 10/22/16 0514 10/23/16 0625 10/24/16 0536 10/25/16 0853  INR 1.26 1.33 1.48 1.77 1.96   Cardiac Enzymes: No results for input(s): CKTOTAL, CKMB, CKMBINDEX, TROPONINI in the last 168 hours.   No results found for this or any previous visit (from the past 240 hour(s)).       Radiology Studies: No results found.      Scheduled Meds: . atorvastatin  40 mg Oral Daily  . carvedilol  12.5 mg Oral BID WC  . feeding supplement (ENSURE ENLIVE)  237 mL Oral BID BM  . furosemide  40 mg Intravenous TID  . isosorbide dinitrate  10 mg Oral TID  . ivabradine  5 mg Oral BID WC  . metolazone  2.5 mg Oral Daily  . pantoprazole  40 mg Oral BID  . polyethylene glycol  17 g Oral Daily  .  sertraline  100 mg Oral QPM  . sodium chloride flush  3 mL Intravenous Q12H  . tamsulosin  0.4 mg Oral QPC breakfast  . warfarin  5 mg Oral ONCE-1800  . Warfarin - Pharmacist Dosing Inpatient   Does not apply q1800   Continuous Infusions:    LOS: 14 days     Cj Edgell, MD [facility] Pager [phone]  If 7PM-7AM, please contact night-coverage [email or web address] Password [facility] 10/25/2016, 12:44 PM ]

## 2016-10-25 NOTE — Progress Notes (Deleted)
HPI: FU atrial fibrillation, coronary disease, hypertension and hyperlipidemia. The patient's cardiac history dates back to 2006 when he had PCI of his LAD and first diagonal. Note his LV function was normal. He also had a subtotal of his PDA. Myoview in July of 2014 showed ejection fraction 69% and normal perfusion. Patient also with h/o atrial fibrillation treated with rate control and anticoagulation. Holter monitor in April of 2015 showed atrial fibrillation with PVCs or aberrantly conducted beats; rate controlled. Patient had repair of AAA in Jan 2011. Now followed by vascular surgery. Echocardiogram June 2017 showed normal LV systolic function, moderate biatrial enlargement, mild MR, dilated aortic root. Admitted with acute encephalopathy 4/18. Felt to have undiagnosed dementia. Also with CHF and new LBBB. Echo 4/18 showed EF 20-25, mild LVE, mild LVH, mild AI, moderate MR, biatrial enlargement, mild RVE, dilated aortic root (4.6 cm). Ischemia eval felt necessary if mental status improved. We ordered a Holter monitor and nuclear study at last office visit. Patient recently admitted with GI bleed and endoscopy showed duodenal ulcers. Patient also with acute on chronic systolic congestive heart failure. He was diuresed with improvement. Since last seen,   No current facility-administered medications for this visit.    No current outpatient prescriptions on file.   Facility-Administered Medications Ordered in Other Visits  Medication Dose Route Frequency Provider Last Rate Last Dose  . acetaminophen (TYLENOL) tablet 650 mg  650 mg Oral Q6H PRN Cherene Altes, MD      . atorvastatin (LIPITOR) tablet 40 mg  40 mg Oral Daily Reyne Dumas, MD   40 mg at 10/25/16 0943  . carvedilol (COREG) tablet 12.5 mg  12.5 mg Oral BID WC Cherene Altes, MD   12.5 mg at 10/25/16 0942  . feeding supplement (ENSURE ENLIVE) (ENSURE ENLIVE) liquid 237 mL  237 mL Oral BID BM Hongalgi, Anand D, MD   237 mL  at 10/25/16 0943  . furosemide (LASIX) injection 40 mg  40 mg Intravenous TID Melina Copa N, PA-C   40 mg at 10/25/16 0943  . heparin ADULT infusion 100 units/mL (25000 units/261mL sodium chloride 0.45%)  1,300 Units/hr Intravenous Continuous Elgergawy, Silver Huguenin, MD 13 mL/hr at 10/25/16 0223 1,300 Units/hr at 10/25/16 0223  . isosorbide dinitrate (ISORDIL) tablet 10 mg  10 mg Oral TID Cherene Altes, MD   10 mg at 10/25/16 0943  . ivabradine (CORLANOR) tablet 5 mg  5 mg Oral BID WC Dorothy Spark, MD   5 mg at 10/25/16 0943  . levalbuterol (XOPENEX) nebulizer solution 0.63 mg  0.63 mg Nebulization Q6H PRN Hongalgi, Anand D, MD      . metolazone (ZAROXOLYN) tablet 2.5 mg  2.5 mg Oral Daily Dorothy Spark, MD   2.5 mg at 10/25/16 0943  . ondansetron (ZOFRAN) tablet 4 mg  4 mg Oral Q6H PRN Reyne Dumas, MD       Or  . ondansetron (ZOFRAN) injection 4 mg  4 mg Intravenous Q6H PRN Reyne Dumas, MD      . pantoprazole (PROTONIX) EC tablet 40 mg  40 mg Oral BID Vena Rua, PA-C   40 mg at 10/25/16 0943  . polyethylene glycol (MIRALAX / GLYCOLAX) packet 17 g  17 g Oral Daily Allie Bossier, MD   17 g at 10/25/16 3875  . sertraline (ZOLOFT) tablet 100 mg  100 mg Oral QPM Reyne Dumas, MD   100 mg at 10/24/16 1747  . sodium chloride  flush (NS) 0.9 % injection 3 mL  3 mL Intravenous Q12H Reyne Dumas, MD   3 mL at 10/25/16 0942  . tamsulosin (FLOMAX) capsule 0.4 mg  0.4 mg Oral QPC breakfast Reyne Dumas, MD   0.4 mg at 10/25/16 0943  . warfarin (COUMADIN) tablet 5 mg  5 mg Oral ONCE-1800 Elgergawy, Silver Huguenin, MD      . Warfarin - Pharmacist Dosing Inpatient   Does not apply q1800 Bajbus, Almeta Monas, [facility]         Past Medical History:  Diagnosis Date  . AAA (abdominal aortic aneurysm) (HCC)    9.1 cm repair 05/2009 ( ENDOVASCULAR REPAIR ).  MORE RECENT RE-EVALUATION OF ENLARGING AAA BY DR. Trula Slade AND BY DR. Kathlene Cote  MAY 2015 - PT STATES HE UNDERSTANDS THAT NOTHING IS LEAKING AT  PRESENT TIME AND HE IS TO FOLLOW UP WITH HIS DOCTORS FOR FUTURE MONITORING I  . Arthritis    LEFT HAND, RT KNEE (08/14/2016)  . Basal cell carcinoma    "right side of my nose; think they burned it off" (08/14/2016)  . CAD (coronary artery disease)    a. s/p PCI to LAD/diagonal, subtotal of PDA 2006. b. Negative nuc 2014.  Marland Kitchen Chronic atrial fibrillation ([place])   . Chronic systolic CHF (congestive heart failure) (HCC)    a. EF 20-25% dx in 08/2016; etiology not yet clear.  . CKD (chronic kidney disease), stage III    a. stage III-IV, Cr baseline appears 1.8-2.0  . COPD (chronic obstructive pulmonary disease) ([place])   . Depression   . GERD (gastroesophageal reflux disease)   . History of kidney stones   . History of pneumothorax    LONG TIME AGO  . Hyperlipidemia   . Hypertension   . Migraine    "very rare the last 20 years" (08/14/2016)  . Myocardial infarction ([place]) 2006  . Osteoarthritis   . Renal cell carcinoma ([place])   . Shingles outbreak    PT NOTICED SKIN RASH RT GROIN AND RIGHT TRUNK ON MONDAY 10/27/13 - NOT A LOT OF DISCOMFORT- NOT ON ANY ORAL MEDS TO TREAT.  . Sleep apnea    "dx'd; didn't try mask" (08/14/2016)  . Stroke [facility])    "CT showed I've had old strokes; never knew about it before today" (08/14/2016)    Past Surgical History:  Procedure Laterality Date  . ABDOMINAL AORTAGRAM N/A 09/24/2013   Procedure: ABDOMINAL Maxcine Ham;  Surgeon: Serafina Mitchell, MD;  Location: [facility] CATH LAB;  Service: Cardiovascular;  Laterality: N/A;  . ABDOMINAL AORTIC ANEURYSM REPAIR  JAN 2011   ENDOVASCULAR - DR. BRABHAM  . CARDIAC CATHETERIZATION  07/04/2004   Archie Endo 07/04/2004  . COLONOSCOPY  latest 2004   Dr Sammuel Cooper.  reported in 11/11/2008 PMD note as normal   . CORONARY ANGIOPLASTY WITH STENT PLACEMENT  07/05/2004   notes 07/05/2004  . ESOPHAGOGASTRODUODENOSCOPY N/A 10/12/2016   Procedure: ESOPHAGOGASTRODUODENOSCOPY (EGD);  Surgeon: Doran Stabler, MD;  Location: [facility] ENDOSCOPY;  Service:  Endoscopy;  Laterality: N/A;  bedside procedure in [facility]  . KNEE ARTHROSCOPY Right   . LITHOTRIPSY    . ROBOT ASSISTED LAPAROSCOPIC NEPHRECTOMY Left 11/06/2013   Procedure: ROBOTIC ASSISTED LAPAROSCOPIC RADICAL  NEPHRECTOMY;  Surgeon: Alexis Frock, MD;  Location: [facility] ORS;  Service: Urology;  Laterality: Left;  . TONSILLECTOMY  1964    Social History   Social History  . Marital status: Divorced    Spouse name: N/A  . Number of children: 2  .  Years of education: N/A   Occupational History  .  Retired   Social History Main Topics  . Smoking status: Former Smoker    Packs/day: 1.50    Years: 23.00    Types: Cigarettes    Quit date: 08/24/1980  . Smokeless tobacco: Never Used  . Alcohol use 1.5 oz/week    3 Standard drinks or equivalent per week     Comment: 08/14/2016 "maybe 2-3 drinks/month average"  . Drug use: No  . Sexual activity: Not on file   Other Topics Concern  . Not on file   Social History Narrative   Retired   Divorced   2 sons 84 &30     Occupation:  Part time sports Tax adviser    Smoking Status:  quit (08/24/1980)   Packs/Day:  1.0    Caffeine use/day:  3 beverages daily    Does Patient Exercise:  no   Alcohol Use - yes    Family History  Problem Relation Age of Onset  . Pulmonary embolism Mother        died of PE  . Heart disease Mother        before age 46  . Anuerysm Father        history of popliteal  . Coronary artery disease Unknown   . Hyperlipidemia Unknown   . Hypertension Unknown   . Prostate cancer Unknown   . Other Neg Hx        polycystic kidney disease, and colon cancer  . Colon cancer Neg Hx     ROS: no fevers or chills, productive cough, hemoptysis, dysphasia, odynophagia, melena, hematochezia, dysuria, hematuria, rash, seizure activity, orthopnea, PND, pedal edema, claudication. Remaining systems are negative.  Physical Exam: Well-developed well-nourished in no acute distress.  Skin is warm and dry.  HEENT is normal.    Neck is supple.  Chest is clear to auscultation with normal expansion.  Cardiovascular exam is regular rate and rhythm.  Abdominal exam nontender or distended. No masses palpated. Extremities show no edema. neuro grossly intact  ECG- personally reviewed  A/P  1  Kirk Ruths, MD

## 2016-10-25 NOTE — Progress Notes (Signed)
Physical Therapy Treatment Patient Details Name: Mark [name] MRN: [id number] DOB: 10-Nov-1940 Today's Date: 10/25/2016    History of Present Illness Pt admitted with GIB. PMH: Recent admission after being found down outside of his home with weakness and AMS, CVA, shingles, OA, migraines, HTN, COPD on 3L, CHF, Depression, CKD, CAD, AAA.    PT Comments    Pt progressing well.  Starting to be much more steady with gait, stairs don't pose much inconvenience and sats/EHR are adequate.   Follow Up Recommendations  SNF;Other (comment) (may end up going home if continues to improve.)     Equipment Recommendations       Recommendations for Other Services       Precautions / Restrictions Precautions Precautions: Fall    Mobility  Bed Mobility               General bed mobility comments: pt sitting up at EOB on arrival  Transfers Overall transfer level: Needs assistance Equipment used: None Transfers: Sit to/from Stand Sit to Stand: Supervision            Ambulation/Gait Ambulation/Gait assistance: Supervision Ambulation Distance (Feet): 400 Feet Assistive device: None Gait Pattern/deviations: Step-through pattern Gait velocity: moderate Gait velocity interpretation: at or above normal speed for age/gender General Gait Details: pt ambulated with 3 L O2, maintaining sats at 91% and EHR at 94 bpm.  Pt generally steady without AD, able to increase speed, but still on the slower side.   Stairs Stairs: Yes   Stair Management: One rail Left;Alternating pattern;Forwards;Backwards Number of Stairs: 3 (x2)    Wheelchair Mobility    Modified Rankin (Stroke Patients Only)       Balance Overall balance assessment: Needs assistance   Sitting balance-Leahy Scale: Good       Standing balance-Leahy Scale: Good                              Cognition Arousal/Alertness: Awake/alert Behavior During Therapy: WFL for tasks assessed/performed Overall  Cognitive Status: Within Functional Limits for tasks assessed                                        Exercises      General Comments        Pertinent Vitals/Pain Pain Assessment: No/denies pain    Home Living                      Prior Function            PT Goals (current goals can now be found in the care plan section) Acute Rehab PT Goals Patient Stated Goal: to return to independence PT Goal Formulation: With patient Time For Goal Achievement: 10/30/16 Potential to Achieve Goals: Good Progress towards PT goals: Progressing toward goals    Frequency    Min 3X/week      PT Plan Current plan remains appropriate    Co-evaluation              AM-PAC PT "6 Clicks" Daily Activity  Outcome Measure  Difficulty turning over in bed (including adjusting bedclothes, sheets and blankets)?: None Difficulty moving from lying on back to sitting on the side of the bed? : None Difficulty sitting down on and standing up from a chair with arms (e.g., wheelchair, bedside commode, etc,.)?: None Help  needed moving to and from a bed to chair (including a wheelchair)?: A Little Help needed walking in hospital room?: A Little Help needed climbing 3-5 steps with a railing? : A Little 6 Click Score: 21    End of Session   Activity Tolerance: Patient tolerated treatment well Patient left: in chair;with call bell/phone within reach;with chair alarm set Nurse Communication: Mobility status PT Visit Diagnosis: Other abnormalities of gait and mobility (R26.89)     Time: 8527-7824 PT Time Calculation (min) (ACUTE ONLY): 26 min  Charges:  $Gait Training: 8-22 mins $Therapeutic Activity: 8-22 mins                    G Codes:       11/12/2016  Donnella Sham, PT [phone]  (pager)   Mark [name] 11/12/16, 4:20 PM

## 2016-10-25 NOTE — Care Management Important Message (Signed)
Important Message  Patient Details  Name: Mark [name] MRN: [id number] Date of Birth: February 22, 1941   Medicare Important Message Given:  Yes    Nathen May 10/25/2016, 10:19 AM

## 2016-10-25 NOTE — Progress Notes (Signed)
ANTICOAGULATION CONSULT NOTE - [place] for Heparin/Coumadin Indication:  Afib, with recent GI bleed  Allergies  Allergen Reactions  . Ace Inhibitors Cough    Patient Measurements: Height: 5\' 11"  (180.3 cm) Weight: 212 lb (96.2 kg) IBW/kg (Calculated) : 75.3   Vital Signs: Temp: 97.4 F (36.3 C) (06/20 0645) BP: 114/87 (06/20 0645) Pulse Rate: 100 (06/20 0645)  Labs:  Recent Labs  10/23/16 0625  10/23/16 1805 10/24/16 0536 10/25/16 0853  HGB 8.5*  --   --  8.5* 8.9*  HCT 27.7*  --   --  27.5* 29.3*  PLT 157  --   --  155 150  LABPROT 18.0*  --   --  20.8* 22.6*  INR 1.48  --   --  1.77 1.96  HEPARINUNFRC  --   < > 0.50 0.51 0.30  CREATININE 1.99*  --   --  1.87*  --   < > = values in this interval not displayed.  Estimated Creatinine Clearance: 41.7 mL/min (A) (by C-G formula based on SCr of 1.87 mg/dL (H)).   Assessment: 76 year old male on warfarin for Afib prior to admission admitted for GI bleed.  INR reversed with Kcentra and Vitmain K on admission 10/11/16.   IV heparin drip started on 10/17/16.   Vascular surgery has seen patient and will not intervene on AAA during this admission. Warfarin restarted 6/14, continues on heparin bridge.  INR is 1.96 trending toward therapeutic goal 2-3.  No DDI. Eating well. INR slow to rise but may be due to previous vit K, also on Ensure. Heparin level remains therapeutic, HL = 0.30  on 1300 units/hr IV heparin drip. HL drawn ~ 6 hours after new IV site obtained for heparin drip due to pt pulled IV line out last night.  Also noted last pm to have some bleeding at previous IV site that was controlled with dressing applied. Currently no bleeding noted.  Hgb stable/low at 8.9 and pltc wnl at 150k.   PTA warfarin regimen: 5 mg daily exc 7.5 mg Wed; INR on admit date 10/11/16 was 2.24, therapeutic.   Goal of Therapy:  Heparin level 0.3-0.5 units/ml (due to recent bleed) INR 2-3 Monitor platelets by  anticoagulation protocol: Yes   Plan:  Continue IV heparin drip at 1300 units/hr.  Give coumadin 5 mg today x1.  Daily heparin level, INR and CBC Monitor for s/sx of bleeding  Nicole Cella, RPh Clinical Pharmacist Pager: [phone] #[phone] [phone] 10/25/2016 10:23 AM

## 2016-10-25 NOTE — Progress Notes (Signed)
OT Cancellation Note  Patient Details Name: Mark [name] MRN: [id number] DOB: 1941-03-11   Cancelled Treatment:    Reason Eval/Treat Not Completed:  (Pt sleeping deeply. Will continue to follow.)  Malka So 10/25/2016, 3:28 PM

## 2016-10-25 NOTE — Progress Notes (Signed)
Progress Note  Patient Name: Mark [name] Date of Encounter: 10/25/2016  Primary Cardiologist: Dr. Stanford Breed  Subjective   The patient continues to improve, significant improvement of SOB since yesterday.  Inpatient Medications    Scheduled Meds: . atorvastatin  40 mg Oral Daily  . carvedilol  12.5 mg Oral BID WC  . feeding supplement (ENSURE ENLIVE)  237 mL Oral BID BM  . furosemide  40 mg Intravenous TID  . isosorbide dinitrate  10 mg Oral TID  . ivabradine  5 mg Oral BID WC  . metolazone  2.5 mg Oral Daily  . pantoprazole  40 mg Oral BID  . polyethylene glycol  17 g Oral Daily  . sertraline  100 mg Oral QPM  . sodium chloride flush  3 mL Intravenous Q12H  . tamsulosin  0.4 mg Oral QPC breakfast  . warfarin  5 mg Oral ONCE-1800  . Warfarin - Pharmacist Dosing Inpatient   Does not apply q1800   Continuous Infusions: . heparin 1,300 Units/hr (10/25/16 0223)   PRN Meds: acetaminophen, levalbuterol, ondansetron **OR** ondansetron (ZOFRAN) IV   Vital Signs    Vitals:   10/24/16 1418 10/24/16 2220 10/25/16 0638 10/25/16 0645  BP: 106/65 100/65  114/87  Pulse: 92 76  100  Resp: 18 18  18   Temp: 98.4 F (36.9 C) 97.8 F (36.6 C)  97.4 F (36.3 C)  TempSrc:      SpO2: 98% 90%  90%  Weight:   212 lb (96.2 kg)   Height:        Intake/Output Summary (Last 24 hours) at 10/25/16 1214 Last data filed at 10/25/16 1033  Gross per 24 hour  Intake           860.01 ml  Output             2875 ml  Net         -2014.99 ml   Filed Weights   10/23/16 0300 10/24/16 0539 10/25/16 0638  Weight: 216 lb 1.6 oz (98 kg) 218 lb 6.4 oz (99.1 kg) 212 lb (96.2 kg)    Telemetry    Atrial fibrillation with rates mostly in the 90s to low 100s and occasionally up to 120s - Personally Reviewed  ECG    No new tracings  Physical Exam   GEN: No acute distress.   Neck: No JVD Cardiac: RRR, no murmurs, rubs, or gallops.  Respiratory: Bibasilar crackles GI: Soft, nontender,  non-distended  MS:  1+ pitting lower extremity edema; No deformity. Neuro:  Nonfocal  Psych: Normal affect   Labs    Chemistry  Recent Labs Lab 10/22/16 0514 10/23/16 0625 10/24/16 0536  NA 139 140 140  K 3.6 3.8 3.9  CL 104 101 101  CO2 28 29 29   GLUCOSE 104* 98 92  BUN 31* 29* 31*  CREATININE 2.01* 1.99* 1.87*  CALCIUM 8.3* 8.7* 8.7*  GFRNONAA 31* 32* 34*  GFRAA 36* 37* 39*  ANIONGAP 7 10 10      Hematology  Recent Labs Lab 10/23/16 0625 10/24/16 0536 10/25/16 0853  WBC 4.4 4.3 4.3  RBC 2.97* 2.90* 3.14*  HGB 8.5* 8.5* 8.9*  HCT 27.7* 27.5* 29.3*  MCV 93.3 94.8 93.3  MCH 28.6 29.3 28.3  MCHC 30.7 30.9 30.4  RDW 18.2* 18.4* 17.8*  PLT 157 155 150    Cardiac EnzymesNo results for input(s): TROPONINI in the last 168 hours. No results for input(s): TROPIPOC in the last 168 hours.  BNPNo results for input(s): BNP, PROBNP in the last 168 hours.   DDimer No results for input(s): DDIMER in the last 168 hours.   Radiology    No results found.  Cardiac Studies   Echocardiogram 08/15/16 Study Conclusions  - Left ventricle: The cavity size was mildly dilated. Wall   thickness was increased in a pattern of mild LVH. There was   moderate focal basal hypertrophy of the septum. Systolic function   was severely reduced. The estimated ejection fraction was in the   range of 20% to 25%. Diffuse hypokinesis. The study is not   technically sufficient to allow evaluation of LV diastolic   function. - Aortic valve: There was mild regurgitation. - Aortic root: The aortic root was mildly dilated. - Ascending aorta: The ascending aorta was mildly dilated. - Mitral valve: There was moderate regurgitation. - Left atrium: The atrium was severely dilated. - Right ventricle: The cavity size was mildly dilated. - Right atrium: The atrium was moderately dilated.  Impressions:  - Severe LV systolic dysfunction; mild AI; mildly dilated aortic   root (4.6 cm; suggest CTA  or MRA to further assess); moderate MR;   biatrial enlargement; mild RVE; mild TR.  Patient Profile     76 y.o. male patient of Dr. Jacalyn Lefevre with history of CAD status post PCI of LAD and first diagonal branch back in 2006, chronic afib on coumadin, HTN, LV dysfunction with EF of 20-25% by echo in 08/2016, AAA status post endoluminal stent grafting 2011 admitted for melena on 10/11/16. Hemoglobin was 7.2. An EGD on 67 did not show any source of bleeding. His Coumadin was discontinued and now heparin has been resumed to do stability of hemoglobin. Cardiology was asked to see on 6/15 for decompensated heart failure. Diuresis was intensified.   Assessment & Plan    Acute on chronic systolic heart failure -Echo in 08/2016 showed EF 20-25% with diffuse hypokinesis. Home regimen included Lasix 60 mg by mouth daily -Cardiology consulted on 6/15 for volume overload/CHF in setting of anemia and GI bleed. -Patient is on Coreg 12.5 mg twice a day, Isordil 10 mg 3 times a day and has been receiving Lasix 40 mg IV 3 times a day -Maximum weight during this hospitalization was 226 pounds (inconsistent wts) on 6/16, current weight is 212 pounds -Fluid status is -2 L since yesterday when metolazone was added with significant symptoms improvement - Continue metolazone till tomorrow, apply Ted hose - Crea 1.99-> 1.87, Hb 8.5-> 8.9 - HR remains high, d/c ivabradin as he is in A-fib, start Digoxin 0.125 mg po daily, decrease to 0.0625 after 3 days  Anemia/GI bleed -Being managed by internal medicine - EGD on 6/7 showed Multiple nonbleeding duodenal ulcers and no source of active bleeding -Coumadin was held for a time however hemoglobin appears to be stable in the range of 8 and heparin has been resumed  CKD stage III -Baseline creatinine 1.8 -Max creatinine 2.06 and has slowly been trending downward during diuresis and is 1.87 today  Chronic atrial fibrillation -Rate controlled on carvedilol 12.5 mg twice a  day -Anticoagulation for stroke risk reduction. Patient currently on heparin drip and Coumadin has been resumed with an INR of 1.77 today, being managed by pharmacy.  Signed, Ena Dawley, MD  10/25/2016, 12:14 PM

## 2016-10-26 ENCOUNTER — Other Ambulatory Visit: Payer: Self-pay | Admitting: Cardiology

## 2016-10-26 ENCOUNTER — Ambulatory Visit: Payer: Self-pay | Admitting: Family

## 2016-10-26 DIAGNOSIS — Z7901 Long term (current) use of anticoagulants: Secondary | ICD-10-CM | POA: Diagnosis not present

## 2016-10-26 DIAGNOSIS — K922 Gastrointestinal hemorrhage, unspecified: Secondary | ICD-10-CM | POA: Diagnosis not present

## 2016-10-26 DIAGNOSIS — I10 Essential (primary) hypertension: Secondary | ICD-10-CM | POA: Diagnosis not present

## 2016-10-26 DIAGNOSIS — J449 Chronic obstructive pulmonary disease, unspecified: Secondary | ICD-10-CM | POA: Diagnosis not present

## 2016-10-26 DIAGNOSIS — I2510 Atherosclerotic heart disease of native coronary artery without angina pectoris: Secondary | ICD-10-CM | POA: Diagnosis not present

## 2016-10-26 DIAGNOSIS — I714 Abdominal aortic aneurysm, without rupture: Secondary | ICD-10-CM | POA: Diagnosis not present

## 2016-10-26 DIAGNOSIS — E784 Other hyperlipidemia: Secondary | ICD-10-CM | POA: Diagnosis not present

## 2016-10-26 DIAGNOSIS — J189 Pneumonia, unspecified organism: Secondary | ICD-10-CM | POA: Diagnosis not present

## 2016-10-26 DIAGNOSIS — I5021 Acute systolic (congestive) heart failure: Secondary | ICD-10-CM | POA: Diagnosis not present

## 2016-10-26 DIAGNOSIS — H1031 Unspecified acute conjunctivitis, right eye: Secondary | ICD-10-CM | POA: Diagnosis not present

## 2016-10-26 DIAGNOSIS — J810 Acute pulmonary edema: Secondary | ICD-10-CM | POA: Diagnosis not present

## 2016-10-26 DIAGNOSIS — F22 Delusional disorders: Secondary | ICD-10-CM | POA: Diagnosis not present

## 2016-10-26 DIAGNOSIS — K269 Duodenal ulcer, unspecified as acute or chronic, without hemorrhage or perforation: Secondary | ICD-10-CM | POA: Diagnosis not present

## 2016-10-26 DIAGNOSIS — I482 Chronic atrial fibrillation, unspecified: Secondary | ICD-10-CM

## 2016-10-26 DIAGNOSIS — R5381 Other malaise: Secondary | ICD-10-CM | POA: Diagnosis not present

## 2016-10-26 DIAGNOSIS — N189 Chronic kidney disease, unspecified: Secondary | ICD-10-CM | POA: Diagnosis not present

## 2016-10-26 DIAGNOSIS — I5022 Chronic systolic (congestive) heart failure: Secondary | ICD-10-CM | POA: Diagnosis not present

## 2016-10-26 DIAGNOSIS — J9611 Chronic respiratory failure with hypoxia: Secondary | ICD-10-CM | POA: Diagnosis not present

## 2016-10-26 DIAGNOSIS — N183 Chronic kidney disease, stage 3 (moderate): Secondary | ICD-10-CM | POA: Diagnosis not present

## 2016-10-26 DIAGNOSIS — E876 Hypokalemia: Secondary | ICD-10-CM | POA: Diagnosis not present

## 2016-10-26 DIAGNOSIS — D62 Acute posthemorrhagic anemia: Secondary | ICD-10-CM | POA: Diagnosis not present

## 2016-10-26 DIAGNOSIS — F5102 Adjustment insomnia: Secondary | ICD-10-CM | POA: Diagnosis not present

## 2016-10-26 DIAGNOSIS — F0150 Vascular dementia without behavioral disturbance: Secondary | ICD-10-CM | POA: Diagnosis not present

## 2016-10-26 DIAGNOSIS — R05 Cough: Secondary | ICD-10-CM | POA: Diagnosis not present

## 2016-10-26 DIAGNOSIS — J9621 Acute and chronic respiratory failure with hypoxia: Secondary | ICD-10-CM | POA: Diagnosis not present

## 2016-10-26 DIAGNOSIS — R600 Localized edema: Secondary | ICD-10-CM | POA: Diagnosis not present

## 2016-10-26 DIAGNOSIS — J80 Acute respiratory distress syndrome: Secondary | ICD-10-CM | POA: Diagnosis not present

## 2016-10-26 DIAGNOSIS — E785 Hyperlipidemia, unspecified: Secondary | ICD-10-CM | POA: Diagnosis not present

## 2016-10-26 DIAGNOSIS — M6281 Muscle weakness (generalized): Secondary | ICD-10-CM | POA: Diagnosis not present

## 2016-10-26 DIAGNOSIS — R1312 Dysphagia, oropharyngeal phase: Secondary | ICD-10-CM | POA: Diagnosis not present

## 2016-10-26 DIAGNOSIS — F0151 Vascular dementia with behavioral disturbance: Secondary | ICD-10-CM | POA: Diagnosis not present

## 2016-10-26 DIAGNOSIS — N179 Acute kidney failure, unspecified: Secondary | ICD-10-CM | POA: Diagnosis not present

## 2016-10-26 DIAGNOSIS — F323 Major depressive disorder, single episode, severe with psychotic features: Secondary | ICD-10-CM | POA: Diagnosis not present

## 2016-10-26 DIAGNOSIS — Z9981 Dependence on supplemental oxygen: Secondary | ICD-10-CM | POA: Diagnosis not present

## 2016-10-26 DIAGNOSIS — K264 Chronic or unspecified duodenal ulcer with hemorrhage: Secondary | ICD-10-CM | POA: Diagnosis not present

## 2016-10-26 DIAGNOSIS — R488 Other symbolic dysfunctions: Secondary | ICD-10-CM | POA: Diagnosis not present

## 2016-10-26 DIAGNOSIS — N400 Enlarged prostate without lower urinary tract symptoms: Secondary | ICD-10-CM | POA: Diagnosis not present

## 2016-10-26 DIAGNOSIS — R2689 Other abnormalities of gait and mobility: Secondary | ICD-10-CM | POA: Diagnosis not present

## 2016-10-26 DIAGNOSIS — Z8719 Personal history of other diseases of the digestive system: Secondary | ICD-10-CM | POA: Diagnosis not present

## 2016-10-26 LAB — CBC
HCT: 27.2 % — ABNORMAL LOW (ref 39.0–52.0)
Hemoglobin: 8.3 g/dL — ABNORMAL LOW (ref 13.0–17.0)
MCH: 28.4 pg (ref 26.0–34.0)
MCHC: 30.5 g/dL (ref 30.0–36.0)
MCV: 93.2 fL (ref 78.0–100.0)
PLATELETS: 157 10*3/uL (ref 150–400)
RBC: 2.92 MIL/uL — ABNORMAL LOW (ref 4.22–5.81)
RDW: 17.6 % — AB (ref 11.5–15.5)
WBC: 5.1 10*3/uL (ref 4.0–10.5)

## 2016-10-26 LAB — BASIC METABOLIC PANEL
Anion gap: 13 (ref 5–15)
BUN: 36 mg/dL — AB (ref 6–20)
CALCIUM: 9.1 mg/dL (ref 8.9–10.3)
CHLORIDE: 96 mmol/L — AB (ref 101–111)
CO2: 32 mmol/L (ref 22–32)
CREATININE: 2.22 mg/dL — AB (ref 0.61–1.24)
GFR calc Af Amer: 32 mL/min — ABNORMAL LOW (ref >60)
GFR calc non Af Amer: 28 mL/min — ABNORMAL LOW (ref >60)
Glucose, Bld: 94 mg/dL (ref 65–99)
Potassium: 4 mmol/L (ref 3.5–5.1)
Sodium: 141 mmol/L (ref 135–145)

## 2016-10-26 LAB — PROTIME-INR
INR: 2.02
PROTHROMBIN TIME: 23.2 s — AB (ref 11.4–15.2)

## 2016-10-26 MED ORDER — ISOSORBIDE DINITRATE 10 MG PO TABS: 10.0000 mg | ORAL_TABLET | Freq: Three times a day (TID) | ORAL | Status: AC

## 2016-10-26 MED ORDER — PANTOPRAZOLE SODIUM 40 MG PO TBEC: 40.0000 mg | DELAYED_RELEASE_TABLET | Freq: Two times a day (BID) | ORAL | Status: AC

## 2016-10-26 MED ORDER — FUROSEMIDE 10 MG/ML PO SOLN
40.0000 mg | Freq: Two times a day (BID) | ORAL | Status: DC
  Administered 2016-10-26: 40 mg via ORAL
  Filled 2016-10-26: qty 4

## 2016-10-26 MED ORDER — WARFARIN SODIUM 5 MG PO TABS
5.0000 mg | ORAL_TABLET | Freq: Once | ORAL | Status: AC
  Administered 2016-10-26: 5 mg via ORAL
  Filled 2016-10-26: qty 1

## 2016-10-26 MED ORDER — POLYETHYLENE GLYCOL 3350 17 G PO PACK: 17.0000 g | PACK | Freq: Every day | ORAL | 0 refills | Status: AC | PRN

## 2016-10-26 MED ORDER — FUROSEMIDE 8 MG/ML PO SOLN: 40.0000 mg | Freq: Two times a day (BID) | ORAL | 12 refills | Status: DC

## 2016-10-26 MED ORDER — LEVALBUTEROL HCL 0.63 MG/3ML IN NEBU: 0.6300 mg | INHALATION_SOLUTION | Freq: Four times a day (QID) | RESPIRATORY_TRACT | 12 refills | Status: AC | PRN

## 2016-10-26 MED ORDER — FUROSEMIDE 8 MG/ML PO SOLN
40.0000 mg | Freq: Two times a day (BID) | ORAL | Status: DC
  Filled 2016-10-26 (×2): qty 5

## 2016-10-26 MED ORDER — DIGOXIN 62.5 MCG PO TABS: ORAL_TABLET | ORAL | Status: DC

## 2016-10-26 NOTE — Progress Notes (Signed)
Progress Note  Patient Name: Mark [name] Date of Encounter: 10/26/2016  Primary Cardiologist: Dr. Stanford Breed  Subjective   The patient continues to improve, significant improvement of SOB since yesterday.  Inpatient Medications    Scheduled Meds: . atorvastatin  40 mg Oral Daily  . carvedilol  12.5 mg Oral BID WC  . digoxin  0.125 mg Oral Daily   Followed by  . [START ON 10/28/2016] digoxin  0.0625 mg Oral Daily  . feeding supplement (ENSURE ENLIVE)  237 mL Oral BID BM  . furosemide  40 mg Intravenous TID  . isosorbide dinitrate  10 mg Oral TID  . metolazone  2.5 mg Oral Daily  . pantoprazole  40 mg Oral BID  . polyethylene glycol  17 g Oral Daily  . sertraline  100 mg Oral QPM  . sodium chloride flush  3 mL Intravenous Q12H  . tamsulosin  0.4 mg Oral QPC breakfast  . Warfarin - Pharmacist Dosing Inpatient   Does not apply q1800   Continuous Infusions:  PRN Meds: acetaminophen, levalbuterol, ondansetron **OR** ondansetron (ZOFRAN) IV   Vital Signs    Vitals:   10/25/16 1419 10/25/16 2133 10/26/16 0527 10/26/16 0841  BP: 100/71 (!) 120/98 115/77   Pulse: 78 (!) 55 94 100  Resp: 20 17 19    Temp: 98.1 F (36.7 C) 98 F (36.7 C)    TempSrc: Oral     SpO2: 90% 96% 91%   Weight:   205 lb 6.4 oz (93.2 kg)   Height:        Intake/Output Summary (Last 24 hours) at 10/26/16 0958 Last data filed at 10/26/16 0853  Gross per 24 hour  Intake              360 ml  Output             3175 ml  Net            -2815 ml   Filed Weights   10/24/16 0539 10/25/16 0638 10/26/16 0527  Weight: 218 lb 6.4 oz (99.1 kg) 212 lb (96.2 kg) 205 lb 6.4 oz (93.2 kg)    Telemetry    Atrial fibrillation with rates mostly in the 90s to low 100s and occasionally up to 120s - Personally Reviewed  ECG    No new tracings  Physical Exam   GEN: No acute distress.   Neck: No JVD Cardiac: RRR, no murmurs, rubs, or gallops.  Respiratory: Bibasilar crackles GI: Soft, nontender,  non-distended  MS:  1+ pitting lower extremity edema; No deformity. Neuro:  Nonfocal  Psych: Normal affect   Labs    Chemistry  Recent Labs Lab 10/23/16 0625 10/24/16 0536 10/26/16 0623  NA 140 140 141  K 3.8 3.9 4.0  CL 101 101 96*  CO2 29 29 32  GLUCOSE 98 92 94  BUN 29* 31* 36*  CREATININE 1.99* 1.87* 2.22*  CALCIUM 8.7* 8.7* 9.1  GFRNONAA 32* 34* 28*  GFRAA 37* 39* 32*  ANIONGAP 10 10 13      Hematology  Recent Labs Lab 10/24/16 0536 10/25/16 0853 10/26/16 0623  WBC 4.3 4.3 5.1  RBC 2.90* 3.14* 2.92*  HGB 8.5* 8.9* 8.3*  HCT 27.5* 29.3* 27.2*  MCV 94.8 93.3 93.2  MCH 29.3 28.3 28.4  MCHC 30.9 30.4 30.5  RDW 18.4* 17.8* 17.6*  PLT 155 150 157    Cardiac EnzymesNo results for input(s): TROPONINI in the last 168 hours. No results for input(s): TROPIPOC in  the last 168 hours.   BNPNo results for input(s): BNP, PROBNP in the last 168 hours.   DDimer No results for input(s): DDIMER in the last 168 hours.   Radiology    No results found.  Cardiac Studies   Echocardiogram 08/15/16 Study Conclusions  - Left ventricle: The cavity size was mildly dilated. Wall   thickness was increased in a pattern of mild LVH. There was   moderate focal basal hypertrophy of the septum. Systolic function   was severely reduced. The estimated ejection fraction was in the   range of 20% to 25%. Diffuse hypokinesis. The study is not   technically sufficient to allow evaluation of LV diastolic   function. - Aortic valve: There was mild regurgitation. - Aortic root: The aortic root was mildly dilated. - Ascending aorta: The ascending aorta was mildly dilated. - Mitral valve: There was moderate regurgitation. - Left atrium: The atrium was severely dilated. - Right ventricle: The cavity size was mildly dilated. - Right atrium: The atrium was moderately dilated.  Impressions:  - Severe LV systolic dysfunction; mild AI; mildly dilated aortic   root (4.6 cm; suggest CTA  or MRA to further assess); moderate MR;   biatrial enlargement; mild RVE; mild TR.  Patient Profile     76 y.o. male patient of Dr. Jacalyn Lefevre with history of CAD status post PCI of LAD and first diagonal branch back in 2006, chronic afib on coumadin, HTN, LV dysfunction with EF of 20-25% by echo in 08/2016, AAA status post endoluminal stent grafting 2011 admitted for melena on 10/11/16. Hemoglobin was 7.2. An EGD on 67 did not show any source of bleeding. His Coumadin was discontinued and now heparin has been resumed to do stability of hemoglobin. Cardiology was asked to see on 6/15 for decompensated heart failure. Diuresis was intensified.   Assessment & Plan    Acute on chronic systolic heart failure -Echo in 08/2016 showed EF 20-25% with diffuse hypokinesis. Home regimen included Lasix 60 mg by mouth daily -Cardiology consulted on 6/15 for volume overload/CHF in setting of anemia and GI bleed. -Patient is on Coreg 12.5 mg twice a day, Isordil 10 mg 3 times a day and has been receiving Lasix 40 mg IV 3 times a day -Maximum weight during this hospitalization was 226 pounds (inconsistent wts) on 6/16, current weight is 205 pounds - Crea is up 1.99-> 1.87 ->2.2, Hb 8.5-> 8.9->8.3 - d/c iv lasix and metolazone, start lasix 40 mg po BID - HR remains high, started Digoxin 0.125 mg po daily, decrease to 0.0625 after 3 days  Anemia/GI bleed -Being managed by internal medicine - EGD on 6/7 showed Multiple nonbleeding duodenal ulcers and no source of active bleeding -Coumadin was held for a time however hemoglobin appears to be stable in the range of 8 and heparin has been resumed  CKD stage III -Baseline creatinine 1.8 -Max creatinine 2.06 and has slowly been trending downward during diuresis and is 1.87 today  Chronic atrial fibrillation -Rate controlled on carvedilol 12.5 mg twice a day -Anticoagulation for stroke risk reduction. Patient currently on heparin drip and Coumadin has been resumed  with an INR of 1.77 today, being managed by pharmacy.  The patient can be sent to a rehabilitation facility today, we'll arrange for outpatient follow-up in 2 weeks, will obtain CBC, BMP, digoxin level at that time.  Signed, Ena Dawley, MD  10/26/2016, 9:58 AM

## 2016-10-26 NOTE — Progress Notes (Signed)
Occupational Therapy Treatment Patient Details Name: Mark [name] MRN: [id number] DOB: Jul 28, 1940 Today's Date: 10/26/2016    History of present illness Pt admitted with GIB. PMH: Recent admission after being found down outside of his home with weakness and AMS, CVA, shingles, OA, migraines, HTN, COPD on 3L, CHF, Depression, CKD, CAD, AAA.   OT comments  Pt's cognitive impairment is not readily apparent, but continues to interfere with safety, particular with executive function. Pt moving about his room with supervision and cues for safety with 02 tubing. Supervised for standing grooming, toileting and LB dressing.   Follow Up Recommendations  SNF    Equipment Recommendations       Recommendations for Other Services      Precautions / Restrictions         Mobility Bed Mobility               General bed mobility comments: pt sitting up at EOB on arrival  Transfers Overall transfer level: Needs assistance Equipment used: None Transfers: Sit to/from Stand Sit to Stand: Modified independent (Device/Increase time)              Balance     Sitting balance-Leahy Scale: Good       Standing balance-Leahy Scale: Good Standing balance comment: prefers no device around obstacles in room                           ADL either performed or assessed with clinical judgement   ADL Overall ADL's : Needs assistance/impaired     Grooming: Brushing hair;Oral care;Wash/dry hands;Wash/dry face;Standing;Supervision/safety               Lower Body Dressing: Supervision/safety;Sitting/lateral leans Lower Body Dressing Details (indicate cue type and reason): pt walking about his room with compression hose only, educated in safety and need for gripper socks Toilet Transfer: Supervision/safety;Ambulation Toilet Transfer Details (indicate cue type and reason): stood to urinate         Functional mobility during ADLs: Supervision/safety General ADL Comments:  Walking in his room with supervision and cues to monitor 02 tubing.      Vision       Perception     Praxis      Cognition Arousal/Alertness: Awake/alert Behavior During Therapy: WFL for tasks assessed/performed Overall Cognitive Status: Impaired/Different from baseline Area of Impairment: Safety/judgement;Memory                     Memory: Decreased short-term memory   Safety/Judgement: Decreased awareness of deficits     General Comments: Pt reports intermittent hallucinations. Asking what he could go to get some lunch. Pt reports his neighbors attempting to help him clean up his home. Pt describes himself as a "saver."        Exercises     Shoulder Instructions       General Comments      Pertinent Vitals/ Pain       Pain Assessment: No/denies pain  Home Living                                          Prior Functioning/Environment              Frequency  Min 2X/week        Progress Toward Goals  OT Goals(current goals can now be found  in the care plan section)  Progress towards OT goals: Progressing toward goals  Acute Rehab OT Goals Patient Stated Goal: to return to independence OT Goal Formulation: With patient Time For Goal Achievement: 10/27/16 Potential to Achieve Goals: Good  Plan Discharge plan remains appropriate    Co-evaluation                 AM-PAC PT "6 Clicks" Daily Activity     Outcome Measure   Help from another person eating meals?: None Help from another person taking care of personal grooming?: A Little Help from another person toileting, which includes using toliet, bedpan, or urinal?: A Little Help from another person bathing (including washing, rinsing, drying)?: A Little Help from another person to put on and taking off regular upper body clothing?: None Help from another person to put on and taking off regular lower body clothing?: A Little 6 Click Score: 20    End of Session  Equipment Utilized During Treatment: Gait belt;Oxygen  OT Visit Diagnosis: Unsteadiness on feet (R26.81);History of falling (Z91.81);Other symptoms and signs involving cognitive function   Activity Tolerance Patient tolerated treatment well   Patient Left in chair;with call bell/phone within reach;with chair alarm set   Nurse Communication          Time: 6004-5997 OT Time Calculation (min): 35 min  Charges: OT General Charges $OT Visit: 1 Procedure OT Treatments $Self Care/Home Management : 23-37 mins    Malka So 10/26/2016, 1:24 PM  [phone]

## 2016-10-26 NOTE — Discharge Summary (Signed)
Mark [name], is a 76 y.o. male  DOB [birth year]  MRN [id number].  Admission date:  10/11/2016  Admitting Physician  Waldemar Dickens, MD  Discharge Date:  10/26/2016   Primary MD  [name], NP  Recommendations for primary care physician for things to follow:  - Please check CBC, BMP in 3 days to ensure stable renal function. - Monitor INR and and adjust warfarin dose as needed.   Admission Diagnosis  Gastrointestinal hemorrhage, unspecified gastrointestinal hemorrhage type [K92.2]   Discharge Diagnosis  Gastrointestinal hemorrhage, unspecified gastrointestinal hemorrhage type [K92.2]    Principal Problem:   GI bleed Active Problems:   Hyperlipidemia   Depression   Essential hypertension   Coronary atherosclerosis   AAA (abdominal aortic aneurysm) (HCC)   Atrial fibrillation, chronic (HCC)   Asthma   Acute on chronic respiratory failure with hypoxia (HCC)   Acute systolic CHF (congestive heart failure) (HCC)   Pulmonary edema      Past Medical History:  Diagnosis Date  . AAA (abdominal aortic aneurysm) (HCC)    9.1 cm repair 05/2009 ( ENDOVASCULAR REPAIR ).  MORE RECENT RE-EVALUATION OF ENLARGING AAA BY DR. Trula Slade AND BY DR. Kathlene Cote  MAY 2015 - PT STATES HE UNDERSTANDS THAT NOTHING IS LEAKING AT PRESENT TIME AND HE IS TO FOLLOW UP WITH HIS DOCTORS FOR FUTURE MONITORING I  . Arthritis    LEFT HAND, RT KNEE (08/14/2016)  . Basal cell carcinoma    "right side of my nose; think they burned it off" (08/14/2016)  . CAD (coronary artery disease)    a. s/p PCI to LAD/diagonal, subtotal of PDA 2006. b. Negative nuc 2014.  Marland Kitchen Chronic atrial fibrillation ([place])   . Chronic systolic CHF (congestive heart failure) (HCC)    a. EF 20-25% dx in 08/2016; etiology not yet clear.  . CKD (chronic kidney disease), stage III    a. stage III-IV, Cr baseline appears 1.8-2.0  . COPD (chronic obstructive  pulmonary disease) ([place])   . Depression   . GERD (gastroesophageal reflux disease)   . History of kidney stones   . History of pneumothorax    LONG TIME AGO  . Hyperlipidemia   . Hypertension   . Migraine    "very rare the last 20 years" (08/14/2016)  . Myocardial infarction ([place]) 2006  . Osteoarthritis   . Renal cell carcinoma ([place])   . Shingles outbreak    PT NOTICED SKIN RASH RT GROIN AND RIGHT TRUNK ON MONDAY 10/27/13 - NOT A LOT OF DISCOMFORT- NOT ON ANY ORAL MEDS TO TREAT.  . Sleep apnea    "dx'd; didn't try mask" (08/14/2016)  . Stroke [facility])    "CT showed I've had old strokes; never knew about it before today" (08/14/2016)    Past Surgical History:  Procedure Laterality Date  . ABDOMINAL AORTAGRAM N/A 09/24/2013   Procedure: ABDOMINAL Maxcine Ham;  Surgeon: Serafina Mitchell, MD;  Location: [facility] CATH LAB;  Service: Cardiovascular;  Laterality: N/A;  . ABDOMINAL AORTIC ANEURYSM REPAIR  JAN 2011   ENDOVASCULAR - DR. BRABHAM  . CARDIAC CATHETERIZATION  07/04/2004   Archie Endo 07/04/2004  . COLONOSCOPY  latest 2004   Dr Sammuel Cooper.  reported in 11/11/2008 PMD note as normal   . CORONARY ANGIOPLASTY WITH STENT PLACEMENT  07/05/2004   notes 07/05/2004  . ESOPHAGOGASTRODUODENOSCOPY N/A 10/12/2016   Procedure: ESOPHAGOGASTRODUODENOSCOPY (EGD);  Surgeon: Doran Stabler, MD;  Location: [facility] ENDOSCOPY;  Service: Endoscopy;  Laterality: N/A;  bedside procedure in [facility] [place]  . KNEE ARTHROSCOPY Right   . LITHOTRIPSY    . ROBOT ASSISTED LAPAROSCOPIC NEPHRECTOMY Left 11/06/2013   Procedure: ROBOTIC ASSISTED LAPAROSCOPIC RADICAL  NEPHRECTOMY;  Surgeon: Alexis Frock, MD;  Location: [facility] ORS;  Service: Urology;  Laterality: Left;  . TONSILLECTOMY  1964       History of present illness and  Hospital Course:     Kindly see H&P for history of present illness and admission details, please review complete Labs, Consult reports and Test reports for all details in brief  HPI  from the history and physical done  on the day of admission 10/11/2016  HPI:  76 year old male with history of AAA, atrial fibrillation on Coumadin, coronary artery disease, COPD, gastroesophageal reflux disease, renal cell carcinoma status post nephrectomy, hypertension, dementia, who presents with tachycardia and melena. Patient was  recently admitted and discharged on 10/07/16 for confusion, slurred speech, MRI of the brain was negative for acute CVA. Patient has been on Coumadin for atrial fibrillation. Patient was seen by PCP 6/6 for hospital follow-up due to progressive memory loss. Noted to have a heart rate in the 150s. Referred to [facility] [place] ED, the patient stated that he has been weak for the last 2-3 days with melena for the last 2-3 days ED course BP 112/72 (BP Location: Left Arm)  Pulse (!) 140  Temp 98.1 F (36.7 C) (Oral)  Resp 20  Ht 5\' 11"  (1.093 m)  Wt 95.3 kg (210 lb)  BMI 29.29 kg/m  FOBT positive, hemoglobin 8.3, platelets 138, INR 2.3, EKG showed atrial fibrillation with a rate of 140. Patient received normal saline bolus, k centra   Hospital Course   76 year old male with history of AAA, atrial fibrillation on Coumadin anticoagulation, CAD, COPD, GERD, renal cell carcinoma status post nephrectomy, HTN and dementia presented with tachycardia and Malena. Patient was admitted and discharged on 10/07/16 for confusion with slurred speech, at which time an MRI of the brain was negative for acute CVA. Patient was seen by his PCP 6/6  for hospital follow-up, and noted to have a heart rate in the 150s. He was referred to [facility] [place] where he also reported 2-3 days of melena. In the ED, his heart rate was 140, guaiac positive, INR 2.3 and hemoglobin 8.3. Status post EGD. Hemoglobin stable over the last 4 days. Treating for decompensated CHF. Despite ongoing Rx for CHF, diuresis, reports DOE. Cardiology consulted for assistance on 6/15.Improving.  Acute upper GI bleed, secondary to multiple  duodenal ulcers:  - Status post EGD. H pylori screen negative. Continue twice a day PPI. Clinically resolved. VVS does not think he has a aorto enteric fistula.  Acute blood loss anemia:  - Hemoglobin dropped from 11.3-8.3 in 4 days. Transfused 3 units PRBCs thus far. Given history of CAD, keep hemoglobin at 8 g or higher. Hemoglobin stable in the 8 range.   Acute on chronic systolic CHF:  - 2-D echo 08/15/16: LVEF 20-25 percent with diffuse hypokinesis. No adequate  response with home dose of Lasix 60 mg PO daily. Cardiology consulted for significant volume overload, was on IV Lasix, with suboptimal response, patient with significant diuresis after started on metolazone, overall patient diuresed 8.1 L during hospital stay, -2.7 L over last 24 hours, creatinine with slight bump, today's 2.2, around baseline, so recommendation discharged on Lasix 40 mg oral twice a day, daily weights, take extra dose of Lasix if more than 2 pounds weight gain in 24 hours. - Continue with beta blockers, none ACEI/ARB candidate given her renal failure, he is on Imdur, holding hydralazine currently given soft blood pressure, will consider Aldactone as an outpatient renal function remained stable, and if blood pressure allows.  Acute on chronic hypoxic respiratory failure:  - Patient on home oxygen 3 L/m continuously. Worsening dyspnea likely secondary to decompensated CHF with some contribution from anemia. Low index of suspicion for pneumonia. Pro-calcitonin <0.1. Also bilateral atelectasis and small pleural effusions on chest x-ray. Incentive spirometry.  Stage III chronic kidney disease:  - Baseline creatinine 1.8. Creatinine is 2.2 on discharge, repeat level in 3 days.  Permanent A. fib with RVR:  - Anticoagulation had been reversed on 10/11/16. On back on warfarin with heparin bridging, INR therapeutic on discharge at 0.02 - Patient on Coreg 12.5 mg twice a day for heart rate control, digoxin was added for better  heart rate control , as could not increase beta blocker given soft blood pressure .  AAA:  - Ultrasound of aorta without evidence of entero-aortic fistula. No BRBPR noted during this hospital stay. MRabdomen 6/12: Redemonstration of surgically treated infrarenal AAA with stent graft. Greatest diameter of the excluded aneurysm sac is relatively unchanged compared to most recent CT studies, approximately 9.1 cm. discussed with vascular surgery and their input appreciated: His AAA has remained stable over time and no intervention is currently recommended. Follow-up with vascular surgery in 6 months to with repeat ultrasound. Does not think that he has a aorto enteric fistula. There is no stranding on his MRI around the graft.  Hypokalemia:  - repleted,  CAD status post PCI:  - Asymptomatic of chest pain.  Dementia:  - Advanced chronic small vessel disease noted on MRI 10/06/16 consistent with vascular dementia. Family reported progressive cognitive decline and feels that he is no longer safe to live on his own. PT/OT suggest SNF placement and patient agreeable. DC to SNF when medically stable.  Hyperlipidemia:  - Statins.  Essential hypertension:  - Controlled.  BPH:  - Continue Flomax.  Depression:  - Stable. Continue Zoloft.  Renal cell carcinoma, status post left nephrectomy.  COPD:  - Stable without clinical bronchospasm.  Thrombocytopenia, chronic:  - ? Secondary to acute blood loss. Resolved.  NSVT:  - 13 beat NSVT noted on 6/13 at 8:41 AM. Does not seem like A. fib with aberrancy. Possibly due to his cardiomyopathy. No further episodes.   Discharge Condition:  Stable   Follow UP  Follow-up Information    Serafina Mitchell, MD Follow up on 04/16/2017.   Specialties:  Vascular Surgery, Cardiology Why:  Your appointment has been arranged. Contact information: [address] [place] [address] [phone]             Discharge Instructions  and   Discharge Medications    Discharge Instructions    Discharge instructions    Complete by:  As directed    Follow with Primary MD [name], NP after discharge from SNF, meanwhile continue to follow with SNF  physician   Get CBC, CMP, digoxin level, INR checked  by Primary MD next visit.    Activity: As tolerated with Full fall precautions use walker/cane & assistance as needed   Disposition Home **   Diet: Heart Healthy , with 1.5 L fluid restriction per day, with feeding assistance and aspiration precautions.  For Heart failure patients - Check your Weight same time everyday, if you gain over 2 pounds, or you develop in leg swelling, experience more shortness of breath or chest pain, call your Primary MD immediately. Follow Cardiac Low Salt Diet and 1.5 lit/day fluid restriction.   On your next visit with your primary care physician please Get Medicines reviewed and adjusted.   Please request your Prim.MD to go over all Hospital Tests and Procedure/Radiological results at the follow up, please get all Hospital records sent to your Prim MD by signing hospital release before you go home.   If you experience worsening of your admission symptoms, develop shortness of breath, life threatening emergency, suicidal or homicidal thoughts you must seek medical attention immediately by calling 911 or calling your MD immediately  if symptoms less severe.  You Must read complete instructions/literature along with all the possible adverse reactions/side effects for all the Medicines you take and that have been prescribed to you. Take any new Medicines after you have completely understood and accpet all the possible adverse reactions/side effects.   Do not drive, operating heavy machinery, perform activities at heights, swimming or participation in water activities or provide baby sitting services if your were admitted for syncope or siezures until you have seen by Primary MD or a  Neurologist and advised to do so again.  Do not drive when taking Pain medications.    Do not take more than prescribed Pain, Sleep and Anxiety Medications  Special Instructions: If you have smoked or chewed Tobacco  in the last 2 yrs please stop smoking, stop any regular Alcohol  and or any Recreational drug use.  Wear Seat belts while driving.   Please note  You were cared for by a hospitalist during your hospital stay. If you have any questions about your discharge medications or the care you received while you were in the hospital after you are discharged, you can call the unit and asked to speak with the hospitalist on call if the hospitalist that took care of you is not available. Once you are discharged, your primary care physician will handle any further medical issues. Please note that NO REFILLS for any discharge medications will be authorized once you are discharged, as it is imperative that you return to your primary care physician (or establish a relationship with a primary care physician if you do not have one) for your aftercare needs so that they can reassess your need for medications and monitor your lab values.   Increase activity slowly    Complete by:  As directed      Allergies as of 10/26/2016      Reactions   Ace Inhibitors Cough      Medication List    STOP taking these medications   furosemide 20 MG tablet Commonly known as:  LASIX Replaced by:  furosemide 8 MG/ML solution   hydrALAZINE 10 MG tablet Commonly known as:  APRESOLINE     TAKE these medications   albuterol 108 (90 Base) MCG/ACT inhaler Commonly known as:  PROVENTIL HFA;VENTOLIN HFA Inhale 2 puffs into the lungs every 6 (six) hours as needed for wheezing or  shortness of breath.   atorvastatin 40 MG tablet Commonly known as:  LIPITOR TAKE 1 TABLET ONE TIME DAILY What changed:  how much to take  how to take this  when to take this  additional instructions   carvedilol 25 MG  tablet Commonly known as:  COREG Take 1 tablet (25 mg total) by mouth 2 (two) times daily with a meal.   Coenzyme Q10 200 MG capsule Take 200 mg by mouth daily.   Digoxin 62.5 MCG Tabs Please take 0.125 mg oral daily for 1 days, then change to 0.0625 mg oral daily on 6/23.   feeding supplement (ENSURE ENLIVE) Liqd Take 237 mLs by mouth daily.   Fish Oil 1200 MG Caps Take 1,200 mg by mouth daily.   furosemide 8 MG/ML solution Commonly known as:  LASIX Take 5 mLs (40 mg total) by mouth 2 (two) times daily. Replaces:  furosemide 20 MG tablet   isosorbide dinitrate 10 MG tablet Commonly known as:  ISORDIL Take 1 tablet (10 mg total) by mouth 3 (three) times daily. What changed:  medication strength  how much to take   levalbuterol 0.63 MG/3ML nebulizer solution Commonly known as:  XOPENEX Take 3 mLs (0.63 mg total) by nebulization every 6 (six) hours as needed for wheezing or shortness of breath.   MACUVITE EYE CARE PO Take 1 capsule by mouth daily.   pantoprazole 40 MG tablet Commonly known as:  PROTONIX Take 1 tablet (40 mg total) by mouth 2 (two) times daily.   polyethylene glycol packet Commonly known as:  MIRALAX / GLYCOLAX Take 17 g by mouth daily as needed.   potassium chloride SA 20 MEQ tablet Commonly known as:  K-DUR,KLOR-CON Take 1 tablet (20 mEq total) by mouth every evening.   ranitidine 150 MG tablet Commonly known as:  ZANTAC Take 1 tablet (150 mg total) by mouth 2 (two) times daily.   sertraline 100 MG tablet Commonly known as:  ZOLOFT Take 1 tablet (100 mg total) by mouth every evening.   tamsulosin 0.4 MG Caps capsule Commonly known as:  FLOMAX TAKE 1 CAPSULE (0.4 MG TOTAL) DAILY AFTER BREAKFAST. What changed:  how much to take  how to take this  when to take this  additional instructions   traZODone 50 MG tablet Commonly known as:  DESYREL Take 1 tablet (50 mg total) by mouth at bedtime. What changed:  when to take  this  reasons to take this   warfarin 5 MG tablet Commonly known as:  COUMADIN TAKE 1 TABLET EVERY DAY EXCEPT 1.5 TABLETS ON WEDNESDAY. What changed:  how much to take  how to take this  when to take this  additional instructions         Diet and Activity recommendation: See Discharge Instructions above   Consults obtained - [facility]   Major procedures and Radiology Reports - PLEASE review detailed and final reports for all details, in brief -   EGD 6/7 significant for - Small hiatal hernia. - Moderate Schatzki ring. - Normal stomach. - Multiple non-bleeding duodenal ulcers with pigmented material. - No specimens collected.  Dg Chest 2 View  Result Date: 10/17/2016 CLINICAL DATA:  Worsening shortness of breath over the past 2 months. Current history of hypertension, CHF, coronary artery disease, chronic kidney disease, atrial fibrillation and COPD. Former smoker. EXAM: CHEST  2 VIEW COMPARISON:  10/12/2016, 10/06/2016 and earlier. FINDINGS: Since the examination 5 days ago, interval resolution of interstitial pulmonary edema. Persistent bilateral  pleural effusions and associated consolidation in the lower lobes and right middle lobe. No new abnormalities. Baseline fibrotic changes again demonstrated. Cardiac silhouette mildly to moderately enlarged, unchanged. Thoracic aorta atherosclerotic and mildly tortuous, unchanged. Hilar and mediastinal contours otherwise unremarkable. Degenerative changes and DISH involving the thoracic spine. IMPRESSION: 1. Persistent bilateral pleural effusions and associated passive atelectasis versus pneumonia involving the lower lobes bilaterally and the right middle lobe since the examination 5 days ago. 2. Interval resolution of the interstitial pulmonary edema present on the examination 5 days ago. 3. No new abnormalities. Electronically Signed   By: Evangeline Dakin M.D.   On: 10/17/2016 20:22   Dg Chest 2 View  Result Date:  10/06/2016 CLINICAL DATA:  Cough and congestion for 2 weeks.  Chest pain. EXAM: CHEST  2 VIEW COMPARISON:  Sep 15, 2016 and September 01, 2016 FINDINGS: There is stable interstitial prominence, most notably in the left upper lobe and both bases, likely due to underlying fibrotic type change. There is no appreciable edema or consolidation. Heart is borderline prominent with pulmonary vascularity within normal limits. No evident adenopathy. There is degenerative change in the thoracic spine with diffuse idiopathic skeletal hyperostosis. IMPRESSION: Areas of underlying scarring/ fibrotic change without frank edema or consolidation. Stable cardiac prominence. 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 No evident adenopathy. There is no appreciable change compared to recent studies. Electronically Signed   By: Lowella Grip III M.D.   On: 10/06/2016 08:00   Ct Head Wo Contrast  Result Date: 10/06/2016 CLINICAL DATA:  Slurred speech and altered mental status. Short-term memory loss. EXAM: CT HEAD WITHOUT CONTRAST TECHNIQUE: Contiguous axial images were obtained from the base of the skull through the vertex without intravenous contrast. COMPARISON:  August 14, 2016 head CT and brain MRI August 14, 2016 FINDINGS: Brain: There is age related volume loss. There is no intracranial mass, hemorrhage, extra-axial fluid collection, or midline shift. There is small vessel disease throughout much of the centra semiovale bilaterally, stable. There is small vessel disease in each caudate nucleus region as well as in the anterior limbs of each internal capsule, stable. There is no new gray-white compartment lesion. No acute infarct evident. Vascular: No hyperdense vessel. There is calcification in each carotid siphon region. Skull: Bony calvarium appears intact. Sinuses/Orbits: There is a a retention cyst, incompletely visualized, in the anterior left maxillary antrum with associated mucosal thickening.  There is mucosal thickening in several ethmoid air cells bilaterally. Frontal sinuses are hypoplastic. Visualized paranasal sinuses elsewhere clear. Orbits appear symmetric bilaterally. Other: Mastoid air cells are clear. IMPRESSION: Fairly extensive supratentorial small vessel disease remain stable. There is age related volume loss. No intracranial mass, hemorrhage, or extra-axial fluid collection. No acute appearing infarct. Areas of vascular calcification noted. Areas of paranasal sinus disease as summarized above. Electronically Signed   By: Lowella Grip III M.D.   On: 10/06/2016 08:18   Mr Brain Wo Contrast  Result Date: 10/06/2016 CLINICAL DATA:  76 year old male with confusion and slurred speech today. Possible dementia. EXAM: MRI HEAD WITHOUT CONTRAST TECHNIQUE: Multiplanar, multiecho pulse sequences of the brain and surrounding structures were obtained without intravenous contrast. COMPARISON:  Head CT without contrast 0806 hours today. Brain MRI 08/14/2016. FINDINGS: Brain: No restricted diffusion to suggest acute infarction. No midline shift, mass effect, evidence of mass lesion, ventriculomegaly, extra-axial collection or acute intracranial hemorrhage. Cervicomedullary junction and pituitary are within normal limits. Confluent bilateral cerebral white matter T2 and FLAIR hyperintensity with several  small areas which most resemble chronic white matter lacunar infarcts. Similar widespread T2 heterogeneity throughout the deep gray matter nuclei, the midbrain and pons, in part related to perivascular spaces. No new signal abnormality identified. Occasional chronic micro hemorrhages in the brain ir stable (right parietal lobe, left posterior temporal lobe). No new signal abnormality identified. Vascular: Major intracranial vascular flow voids are stable with generalized intracranial artery dolichoectasia. Skull and upper cervical spine: Negative. Normal bone marrow signal. Sinuses/Orbits: Normal  orbits soft tissues. Stable paranasal sinuses and mastoids, with occasional mucosal thickening and small mucous retention cysts. Other: Visible internal auditory structures appear normal. Negative scalp soft tissues. IMPRESSION: 1. Stable noncontrast MRI appearance of the brain since April with no acute intracranial abnormality. 2. Widespread signal changes again suggestive of advanced chronic small vessel disease. Consider vascular causes of dementia. Electronically Signed   By: Genevie Ann M.D.   On: 10/06/2016 15:17   Mr Jodene Nam Abdomen Wo Contrast  Result Date: 10/18/2016 CLINICAL DATA:  76 year old male with a history of infrarenal abdominal aortic aneurysm measuring as large as 8.8 cm before treatment with endovascular repair. Repair performed 06/03/2009, with main body from the right. There was enlargement on follow-up CT 09/08/2013 with largest diameter at that time 9.6 cm. Angiogram 10/13/2013 revealed no evidence of type 2 endoleak. Follow-up CT studies since the angiogram have demonstrated relatively stable size of the aneurysm sac, with greatest diameter measuring between 8.8 cm and 9 cm. Ultrasound performed 10/12/2016 demonstrates estimated diameter 9.4 cm. Patient presents for MR evaluation. Study was scheduled with gadolinium contrast, although the patient was unable to tolerate completion of the study and was performed without only. EXAM: MRI/MRA ABDOMEN WITHOUT CONTRAST TECHNIQUE: Multiplanar multisequence MR imaging was performed without the administration of intravenous contrast. COMPARISON:  Ultrasound 10/12/2016, most recent CT 12/28/2015. Initial CT 05/31/2009 FINDINGS: Lower chest: Bilateral pleural effusions Vascular: Re- demonstration of infrarenal abdominal aortic aneurysm with changes of endovascular repair/exclusion. Greatest diameter of the excluded aneurysm sac on the current MR estimated 9.1 cm (axial images). This is not significantly changed in size from the recent CT of 12/28/2015 when  the greatest diameter measured 8.9 cm. Nonvascular: T2 intense cyst of the hepatic dome measures 16 mm, unchanged from prior CT studies. Unremarkable MR appearance of small bowel and colon. Surgical changes of left nephrectomy. Unremarkable appearance of the right kidney with T2 intense cyst on the lateral cortex, unchanged from prior. Uniform marrow signal. Endplate changes compatible with Schmorl's nodes. No compression deformity. No bony canal narrowing. IMPRESSION: Re- demonstration of surgically treated infrarenal abdominal aortic aneurysm with stent graft. Greatest diameter of the excluded aneurysm sac is relatively unchanged compared to most recent CT studies, approximately 9.1 cm. Bilateral pleural effusions. Surgical changes of prior left nephrectomy. Signed, Dulcy Fanny. Earleen Newport, DO [facility] [facility] Radiology Electronically Signed   By: Corrie Mckusick D.O.   On: 10/18/2016 14:35   US Aorta  Result Date: 10/13/2016 CLINICAL DATA:  Abdominal aortic aneurysm AA status post stent graft repair EXAM: ULTRASOUND OF ABDOMINAL AORTA TECHNIQUE: Ultrasound examination of the abdominal aorta was performed to evaluate for abdominal aortic aneurysm. COMPARISON:  12/28/2015 CT FINDINGS: Abdominal aortic measurements as follows: Proximal:  3.1 cm Mid:  9.4 cm Distal:  7.8 cm IMPRESSION: Compare to the prior study, max small sac diameter has increased from 8.8 cm to 9.4 cm. It is difficult to determine if this represents interval growth or simply inaccurate comparison due to differences in modality. CT of  the abdomen would be a more accurate measure of a direct comparison to the prior study. Interval growth of the aneurysm sac cannot be excluded. Electronically Signed   By: Marybelle Killings M.D.   On: 10/13/2016 07:08   Dg Chest Port 1 View  Result Date: 10/12/2016 CLINICAL DATA:  Acute respiratory distress. EXAM: PORTABLE CHEST 1 VIEW COMPARISON:  Frontal and lateral views  10/06/2016 FINDINGS: The heart is enlarged. Unchanged mediastinal contours. Development of multifocal opacities throughout the right lung. Probable retrocardiac opacity. There is vascular congestion and mild peribronchial cuffing. Difficult to exclude pleural effusions. No pneumothorax. IMPRESSION: Development of multifocal opacities throughout the right lung and retrocardiac left lung base, may be pulmonary edema, pneumonia or aspiration. Difficult to exclude pleural effusions. Cardiomegaly with vascular congestion. Electronically Signed   By: Jeb Levering M.D.   On: 10/12/2016 04:20    Micro Results     No results found for this or any previous visit (from the past 240 hour(s)).     Today   Subjective:   Daksh Coates today has no headache,no chest or abdominal pain,no new weakness tingling or numbness, feels much better wants to go home today.  Objective:   Blood pressure 115/77, pulse 100, temperature 98 F (36.7 C), resp. rate 19, height 5\' 11"  (1.803 m), weight 93.2 kg (205 lb 6.4 oz), SpO2 91 %.   Intake/Output Summary (Last 24 hours) at 10/26/16 1243 Last data filed at 10/26/16 0853  Gross per 24 hour  Intake              360 ml  Output             2325 ml  Net            -1965 ml    Exam Awake Alert, Oriented x 3,   Symmetrical Chest wall movement, Good air movement bilaterally, CTAB RRR,No Gallops,Rubs or new Murmurs, No Parasternal Heave +ve B.Sounds, Abd Soft, Non tender, No rebound -guarding or rigidity. No Cyanosis, Clubbing , No new Rash or bruise, +1 edema  Data Review   CBC w Diff:  Lab Results  Component Value Date   WBC 5.1 10/26/2016   HGB 8.3 (L) 10/26/2016   HCT 27.2 (L) 10/26/2016   PLT 157 10/26/2016   LYMPHOPCT 14 10/11/2016   MONOPCT 6 10/11/2016   EOSPCT 1 10/11/2016   BASOPCT 0 10/11/2016    CMP:  Lab Results  Component Value Date   NA 141 10/26/2016   K 4.0 10/26/2016   CL 96 (L) 10/26/2016   CO2 32 10/26/2016   BUN 36 (H)  10/26/2016   CREATININE 2.22 (H) 10/26/2016   CREATININE 2.33 (H) 09/01/2016   PROT 5.1 (L) 10/15/2016   ALBUMIN 2.6 (L) 10/15/2016   BILITOT 2.0 (H) 10/15/2016   ALKPHOS 61 10/15/2016   AST 21 10/15/2016   ALT 17 10/15/2016  .   Total Time in preparing paper work, data evaluation and todays exam - 35 minutes  Kinnley Paulson M.D on 10/26/2016 at 12:43 PM  [facility]   Office  [phone]

## 2016-10-26 NOTE — Progress Notes (Signed)
Report called to [facility], Therapist, sports at [place]. All questions answered.

## 2016-10-26 NOTE — Clinical Social Work Placement (Signed)
   CLINICAL SOCIAL WORK PLACEMENT  NOTE  Date:  10/26/2016  Patient Details  Name: Mark [name] MRN: [id number] Date of Birth: June 11, 1940  Clinical Social Work is seeking post-discharge placement for this patient at the [place] level of care (*CSW will initial, date and re-position this form in  chart as items are completed):  Yes   Patient/family provided with [place] Work Department's list of facilities offering this level of care within the geographic area requested by the patient (or if unable, by the patient's family).  Yes   Patient/family informed of their freedom to choose among providers that offer the needed level of care, that participate in Medicare, Medicaid or managed care program needed by the patient, have an available bed and are willing to accept the patient.  Yes   Patient/family informed of [facility]'s ownership interest in [facility] and [facility], as well as of the fact that they are under no obligation to receive care at these facilities.  PASRR submitted to EDS on 10/16/16     PASRR number received on 10/16/16     Existing PASRR number confirmed on       FL2 transmitted to all facilities in geographic area requested by pt/family on 10/16/16     FL2 transmitted to all facilities within larger geographic area on       Patient informed that his/her managed care company has contracts with or will negotiate with certain facilities, including the following:        Yes   Patient/family informed of bed offers received.  Patient chooses bed at [place] recommends and patient chooses bed at      Patient to be transferred to [facility] on 10/26/16.  Patient to be transferred to facility by [facility]     Patient family notified on 10/26/16 of transfer.  Name of family member notified:  Son     PHYSICIAN       Additional Comment:     _______________________________________________ Benard Halsted, [place] 10/26/2016, 1:13 PM

## 2016-10-26 NOTE — Progress Notes (Signed)
ANTICOAGULATION CONSULT NOTE - [place] for Coumadin Indication:  Afib, with recent GI bleed  Allergies  Allergen Reactions  . Ace Inhibitors Cough    Patient Measurements: Height: 5\' 11"  (180.3 cm) Weight: 205 lb 6.4 oz (93.2 kg) IBW/kg (Calculated) : 75.3   Vital Signs: BP: 115/77 (06/21 0527) Pulse Rate: 100 (06/21 0841)  Labs:  Recent Labs  10/23/16 1805  10/24/16 0536 10/25/16 0853 10/26/16 0623  HGB  --   < > 8.5* 8.9* 8.3*  HCT  --   --  27.5* 29.3* 27.2*  PLT  --   --  155 150 157  LABPROT  --   --  20.8* 22.6* 23.2*  INR  --   --  1.77 1.96 2.02  HEPARINUNFRC 0.50  --  0.51 0.30  --   CREATININE  --   --  1.87*  --  2.22*  < > = values in this interval not displayed.  Estimated Creatinine Clearance: 34.6 mL/min (A) (by C-G formula based on SCr of 2.22 mg/dL (H)).  Assessment: 76 year old male on warfarin for Afib prior to admission admitted for GI bleed.  INR reversed with Kcentra and Vitmain K on admission 10/11/16.   IV heparin drip started on 10/17/16 but now transitioned to warfarin alone as INR is now therapeutic at 2.02.  PTA warfarin regimen: 5 mg daily exc 7.5 mg Wed; INR on admit date 10/11/16 was 2.24, therapeutic.   Goal of Therapy:  INR 2-3 Monitor platelets by anticoagulation protocol: Yes   Plan:  Warfarin 5mg  PO x 1 tonight - attempting to resume home dose Daily INR  Salome Arnt, PharmD, BCPS Pager # [phone] 10/26/2016 10:16 AM

## 2016-10-26 NOTE — Progress Notes (Signed)
Pt D/C'd to [facility] via [place].

## 2016-10-26 NOTE — Progress Notes (Signed)
Patient will DC to: [facility] Anticipated DC date: 10/26/16 Family notified: Left vm for son Transport by: [facility] 3:30pm   Per MD patient ready for DC to [facility] RN, patient, patient's family, and facility notified of DC. Discharge Summary sent to facility. RN given number for report [phone]). DC packet on chart. Ambulance transport requested for patient.   CSW signing off.  Cedric Fishman, [place] Social Worker [phone]

## 2016-10-27 ENCOUNTER — Encounter: Payer: Self-pay | Admitting: Adult Health

## 2016-10-27 ENCOUNTER — Non-Acute Institutional Stay (SKILLED_NURSING_FACILITY): Payer: Medicare HMO | Admitting: Adult Health

## 2016-10-27 DIAGNOSIS — J9611 Chronic respiratory failure with hypoxia: Secondary | ICD-10-CM | POA: Diagnosis not present

## 2016-10-27 DIAGNOSIS — K264 Chronic or unspecified duodenal ulcer with hemorrhage: Secondary | ICD-10-CM

## 2016-10-27 DIAGNOSIS — I2510 Atherosclerotic heart disease of native coronary artery without angina pectoris: Secondary | ICD-10-CM

## 2016-10-27 DIAGNOSIS — I5021 Acute systolic (congestive) heart failure: Secondary | ICD-10-CM

## 2016-10-27 DIAGNOSIS — I482 Chronic atrial fibrillation, unspecified: Secondary | ICD-10-CM

## 2016-10-27 DIAGNOSIS — D62 Acute posthemorrhagic anemia: Secondary | ICD-10-CM

## 2016-10-27 DIAGNOSIS — I714 Abdominal aortic aneurysm, without rupture, unspecified: Secondary | ICD-10-CM

## 2016-10-27 DIAGNOSIS — J45909 Unspecified asthma, uncomplicated: Secondary | ICD-10-CM

## 2016-10-27 DIAGNOSIS — R5381 Other malaise: Secondary | ICD-10-CM

## 2016-10-27 DIAGNOSIS — F321 Major depressive disorder, single episode, moderate: Secondary | ICD-10-CM

## 2016-10-27 DIAGNOSIS — N183 Chronic kidney disease, stage 3 unspecified: Secondary | ICD-10-CM

## 2016-10-27 DIAGNOSIS — F0150 Vascular dementia without behavioral disturbance: Secondary | ICD-10-CM

## 2016-10-27 DIAGNOSIS — I10 Essential (primary) hypertension: Secondary | ICD-10-CM | POA: Diagnosis not present

## 2016-10-27 DIAGNOSIS — G4700 Insomnia, unspecified: Secondary | ICD-10-CM

## 2016-10-27 DIAGNOSIS — N400 Enlarged prostate without lower urinary tract symptoms: Secondary | ICD-10-CM

## 2016-10-27 DIAGNOSIS — E876 Hypokalemia: Secondary | ICD-10-CM | POA: Diagnosis not present

## 2016-10-27 NOTE — Progress Notes (Signed)
DATE:  10/27/2016   MRN:  [id number]  BIRTHDAY: Dec 07, 1940  Facility:  Nursing Home Location:  [facility] Room Number: 216-A  LEVEL OF CARE:  SNF (31)  Contact Information    Name Relation Home Work Mobile   [place] Friend   [phone]   Delyle, Weider   [phone]       Code Status History    Date Active Date Inactive Code Status Order ID Comments User Context   10/11/2016  5:32 PM 10/26/2016  8:59 PM Full Code [id number]  Reyne Dumas, MD Inpatient   10/06/2016  5:45 PM 10/07/2016 10:12 PM Full Code [id number]  Janece Canterbury, MD Inpatient   08/14/2016  5:20 PM 08/22/2016  4:57 PM Full Code [id number]  Waldemar Dickens, MD Inpatient   11/06/2013  1:30 PM 11/08/2013  3:52 PM Full Code [id number]  Phebe Colla, MD Inpatient   10/13/2013  2:07 PM 10/14/2013  3:37 AM Full Code [id number]  Azzie Roup, MD [facility]       Chief Complaint  Patient presents with  . Hospitalization Follow-up    HFU    HISTORY OF PRESENT ILLNESS:  This is a 76-YO male seen for hospital followup. He has been admitted to [facility] thru 10/16/16 with Gastrointestinal hemorrhage. EGD on 10/12/16 showed multiple nonbleeding Duodenal ulcers and no source of active bleeding. Hemoglobin dropped from 11.3 to 8.3 . FOBT was positive. He was transfused 3 units of PRBC. Coumadin was held for a time however hemoglobin appears to be stable in the range of 8 so Coumadin was restarted. He was found to have acute on chronic systolic heart failure. Cardiology was consulted and was given IV Lasix and metolazone. He was diuresed 8.1L during hospital stay. He was later on shifted to oral Lasix 40 mg twice a day, metolazone discontinued and started on digoxin. He has PMH of history of AAA, atrial fibrillation on Coumadin, CAD, COPD, GERD, renal cell carcinoma S/P nephrectomy, hypertension and dementia.  He was seen in his  room today and is aware that he will be here for rehabilitation.  PAST MEDICAL HISTORY:  Past Medical History:  Diagnosis Date  . AAA (abdominal aortic aneurysm) (HCC)    9.1 cm repair 05/2009 ( ENDOVASCULAR REPAIR ).  MORE RECENT RE-EVALUATION OF ENLARGING AAA BY DR. Trula Slade AND BY DR. Kathlene Cote  MAY 2015 - PT STATES HE UNDERSTANDS THAT NOTHING IS LEAKING AT PRESENT TIME AND HE IS TO FOLLOW UP WITH HIS DOCTORS FOR FUTURE MONITORING I  . Arthritis    LEFT HAND, RT KNEE (08/14/2016)  . Basal cell carcinoma    "right side of my nose; think they burned it off" (08/14/2016)  . CAD (coronary artery disease)    a. s/p PCI to LAD/diagonal, subtotal of PDA 2006. b. Negative nuc 2014.  Marland Kitchen Chronic atrial fibrillation ([place])   . Chronic systolic CHF (congestive heart failure) (HCC)    a. EF 20-25% dx in 08/2016; etiology not yet clear.  . CKD (chronic kidney disease), stage III    a. stage III-IV, Cr baseline appears 1.8-2.0  . COPD (chronic obstructive pulmonary disease) ([place])   . Depression   . GERD (gastroesophageal reflux disease)   . History of kidney stones   . History of pneumothorax    LONG TIME AGO  . Hyperlipidemia   . Hypertension   . Migraine    "very rare the  last 20 years" (08/14/2016)  . Myocardial infarction ([employer]) 2006  . Osteoarthritis   . Renal cell carcinoma ([place])   . Shingles outbreak    PT NOTICED SKIN RASH RT GROIN AND RIGHT TRUNK ON MONDAY 10/27/13 - NOT A LOT OF DISCOMFORT- NOT ON ANY ORAL MEDS TO TREAT.  . Sleep apnea    "dx'd; didn't try mask" (08/14/2016)  . Stroke [facility])    "CT showed I've had old strokes; never knew about it before today" (08/14/2016)     CURRENT MEDICATIONS: Reviewed  Patient's Medications  New Prescriptions   No medications on file  Previous Medications   ALBUTEROL (PROVENTIL HFA;VENTOLIN HFA) 108 (90 BASE) MCG/ACT INHALER    Inhale 2 puffs into the lungs every 6 (six) hours as needed for wheezing or shortness of breath.   ATORVASTATIN (LIPITOR) 40  MG TABLET    TAKE 1 TABLET ONE TIME DAILY   CARVEDILOL (COREG) 25 MG TABLET    Take 1 tablet (25 mg total) by mouth 2 (two) times daily with a meal.   COENZYME Q10 200 MG CAPSULE    Take 200 mg by mouth daily.   DIGOXIN 62.5 MCG TABS    Please take 0.125 mg oral daily for 1 days, then change to 0.0625 mg oral daily on 6/23.   FEEDING SUPPLEMENT, ENSURE ENLIVE, (ENSURE ENLIVE) LIQD    Take 237 mLs by mouth daily.   FUROSEMIDE (LASIX) 8 MG/ML SOLUTION    Take 5 mLs (40 mg total) by mouth 2 (two) times daily.   ISOSORBIDE DINITRATE (ISORDIL) 10 MG TABLET    Take 1 tablet (10 mg total) by mouth 3 (three) times daily.   LEVALBUTEROL (XOPENEX) 0.63 MG/3ML NEBULIZER SOLUTION    Take 3 mLs (0.63 mg total) by nebulization every 6 (six) hours as needed for wheezing or shortness of breath.   MULTIPLE VITAMINS-MINERALS (MACUVITE EYE CARE PO)    Take 1 capsule by mouth daily.   OMEGA-3 FATTY ACIDS (FISH OIL) 1200 MG CAPS    Take 1,200 mg by mouth daily.    OXYGEN    Inhale 3 L/min into the lungs continuous.   PANTOPRAZOLE (PROTONIX) 40 MG TABLET    Take 1 tablet (40 mg total) by mouth 2 (two) times daily.   POLYETHYLENE GLYCOL (MIRALAX / GLYCOLAX) PACKET    Take 17 g by mouth daily as needed.   POTASSIUM CHLORIDE SA (K-DUR,KLOR-CON) 20 MEQ TABLET    Take 1 tablet (20 mEq total) by mouth every evening.   RANITIDINE (ZANTAC) 150 MG TABLET    Take 1 tablet (150 mg total) by mouth 2 (two) times daily.   SERTRALINE (ZOLOFT) 100 MG TABLET    Take 1 tablet (100 mg total) by mouth every evening.   TAMSULOSIN (FLOMAX) 0.4 MG CAPS CAPSULE    TAKE 1 CAPSULE (0.4 MG TOTAL) DAILY AFTER BREAKFAST.   TRAZODONE (DESYREL) 50 MG TABLET    Take 1 tablet (50 mg total) by mouth at bedtime.   WARFARIN (COUMADIN) 5 MG TABLET    TAKE 1 TABLET EVERY DAY EXCEPT 1.5 TABLETS ON WEDNESDAY.  Modified Medications   No medications on file  Discontinued Medications   No medications on file     Allergies  Allergen Reactions  . Ace  Inhibitors Cough     REVIEW OF SYSTEMS:  GENERAL: no change in appetite, no fatigue, no weight changes, no fever, chills or weakness EYES: Denies change in vision, dry eyes, eye pain, itching or discharge  EARS: Denies change in hearing, ringing in ears, or earache NOSE: Denies nasal congestion or epistaxis MOUTH and THROAT: Denies oral discomfort, gingival pain or bleeding, pain from teeth or hoarseness   RESPIRATORY: no cough, SOB, DOE, wheezing, hemoptysis CARDIAC: no chest pain or palpitations GI: no abdominal pain, diarrhea, constipation, heart burn, nausea or vomiting GU: Denies dysuria, frequency, hematuria, incontinence, or discharge PSYCHIATRIC: Denies feeling of depression or anxiety. No report of hallucinations, insomnia, paranoia, or agitation     PHYSICAL EXAMINATION  GENERAL APPEARANCE: Well nourished. In no acute distress.  SKIN:  Skin is warm and dry. Has bruising on right upper arm (from possible IV sites) HEAD: Normal in size and contour. No evidence of trauma EYES: Lids open and close normally. No blepharitis, entropion or ectropion. PERRL. Conjunctivae are clear and sclerae are white. Lenses are without opacity EARS: Pinnae are normal. Patient hears normal voice tunes of the examiner MOUTH and THROAT: Lips are without lesions. Oral mucosa is moist and without lesions. Tongue is normal in shape, size, and color and without lesions NECK: supple, trachea midline, no neck masses, no thyroid tenderness, no thyromegaly LYMPHATICS: no LAN in the neck, no supraclavicular LAN RESPIRATORY: breathing is even & unlabored, BS CTAB CARDIAC: Irregular heart rate, no murmur,no extra heart sounds, BLE trace edema GI: abdomen soft, normal BS, no masses, no tenderness, no hepatomegaly, no splenomegaly EXTREMITIES:  Able to move X 4 extremities PSYCHIATRIC: Alert and oriented X 3. Affect and behavior are appropriate    LABS/RADIOLOGY: Labs reviewed: Basic Metabolic  Panel:  Recent Labs  08/14/16 1757  10/19/16 1648  10/20/16 1255 10/21/16 6767  10/23/16 0625 10/24/16 0536 10/26/16 0623  NA  --   < > 140  < >  --  140  < > 140 140 141  K  --   < > 3.4*  < >  --  4.3  < > 3.8 3.9 4.0  CL  --   < > 109  < >  --  104  < > 101 101 96*  CO2  --   < > 22  < >  --  28  < > 29 29 32  GLUCOSE  --   < > 81  < >  --  107*  < > 98 92 94  BUN  --   < > 26*  < >  --  28*  < > 29* 31* 36*  CREATININE  --   < > 2.06*  < >  --  2.01*  < > 1.99* 1.87* 2.22*  CALCIUM  --   < > 7.8*  < >  --  8.5*  < > 8.7* 8.7* 9.1  MG 2.1  < > 1.6*  --  1.8 2.0  --   --   --   --   PHOS 3.3  --   --   --   --   --   --   --   --   --   < > = values in this interval not displayed. Liver Function Tests:  Recent Labs  10/11/16 1327 10/12/16 1133 10/15/16 0531  AST 24 23 21   ALT 26 23 17   ALKPHOS 56 53 61  BILITOT 1.2 2.0* 2.0*  PROT 5.4* 4.9* 5.1*  ALBUMIN 2.7* 2.5* 2.6*    Recent Labs  08/14/16 1303  AMMONIA 13   CBC:  Recent Labs  08/14/16 1023  08/18/16 0354  10/11/16 1327  10/24/16 0536 10/25/16  6213 10/26/16 0623  WBC 4.9  < > 5.4  < > 7.1  < > 4.3 4.3 5.1  NEUTROABS 3.8  --  3.7  --  5.6  --   --   --   --   HGB 12.0*  < > 11.1*  < > 8.3*  < > 8.5* 8.9* 8.3*  HCT 36.3*  < > 34.9*  < > 26.3*  < > 27.5* 29.3* 27.2*  MCV 95.0  < > 95.1  < > 96.3  < > 94.8 93.3 93.2  PLT 153  < > 143*  < > 138*  < > 155 150 157  < > = values in this interval not displayed. Lipid Panel:  Recent Labs  03/24/16 1012 10/07/16 0819  HDL 46.40 25*   Cardiac Enzymes:  Recent Labs  10/11/16 1327 10/11/16 1930 10/12/16 1133  TROPONINI 0.12* 0.16* 0.70*   CBG:  Recent Labs  08/18/16 1147 10/06/16 0802 10/07/16 1627  GLUCAP 101* 109* 106*      Dg Chest 2 View  Result Date: 10/17/2016 CLINICAL DATA:  Worsening shortness of breath over the past 2 months. Current history of hypertension, CHF, coronary artery disease, chronic kidney disease, atrial  fibrillation and COPD. Former smoker. EXAM: CHEST  2 VIEW COMPARISON:  10/12/2016, 10/06/2016 and earlier. FINDINGS: Since the examination 5 days ago, interval resolution of interstitial pulmonary edema. Persistent bilateral pleural effusions and associated consolidation in the lower lobes and right middle lobe. No new abnormalities. Baseline fibrotic changes again demonstrated. Cardiac silhouette mildly to moderately enlarged, unchanged. Thoracic aorta atherosclerotic and mildly tortuous, unchanged. Hilar and mediastinal contours otherwise unremarkable. Degenerative changes and DISH involving the thoracic spine. IMPRESSION: 1. Persistent bilateral pleural effusions and associated passive atelectasis versus pneumonia involving the lower lobes bilaterally and the right middle lobe since the examination 5 days ago. 2. Interval resolution of the interstitial pulmonary edema present on the examination 5 days ago. 3. No new abnormalities. Electronically Signed   By: Evangeline Dakin M.D.   On: 10/17/2016 20:22   Dg Chest 2 View  Result Date: 10/06/2016 CLINICAL DATA:  Cough and congestion for 2 weeks.  Chest pain. EXAM: CHEST  2 VIEW COMPARISON:  Sep 15, 2016 and September 01, 2016 FINDINGS: There is stable interstitial prominence, most notably in the left upper lobe and both bases, likely due to underlying fibrotic type change. There is no appreciable edema or consolidation. Heart is borderline prominent with pulmonary vascularity within normal limits. No evident adenopathy. There is degenerative change in the thoracic spine with diffuse idiopathic skeletal hyperostosis. IMPRESSION: Areas of underlying scarring/ fibrotic change without frank edema or consolidation. Stable cardiac prominence. 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 No evident adenopathy. There is no appreciable change compared to recent studies. Electronically Signed   By: Lowella Grip III M.D.   On: 10/06/2016 08:00    Ct Head Wo Contrast  Result Date: 10/06/2016 CLINICAL DATA:  Slurred speech and altered mental status. Short-term memory loss. EXAM: CT HEAD WITHOUT CONTRAST TECHNIQUE: Contiguous axial images were obtained from the base of the skull through the vertex without intravenous contrast. COMPARISON:  August 14, 2016 head CT and brain MRI August 14, 2016 FINDINGS: Brain: There is age related volume loss. There is no intracranial mass, hemorrhage, extra-axial fluid collection, or midline shift. There is small vessel disease throughout much of the centra semiovale bilaterally, stable. There is small vessel disease in each caudate nucleus region as well as  in the anterior limbs of each internal capsule, stable. There is no new gray-white compartment lesion. No acute infarct evident. Vascular: No hyperdense vessel. There is calcification in each carotid siphon region. Skull: Bony calvarium appears intact. Sinuses/Orbits: There is a a retention cyst, incompletely visualized, in the anterior left maxillary antrum with associated mucosal thickening. There is mucosal thickening in several ethmoid air cells bilaterally. Frontal sinuses are hypoplastic. Visualized paranasal sinuses elsewhere clear. Orbits appear symmetric bilaterally. Other: Mastoid air cells are clear. IMPRESSION: Fairly extensive supratentorial small vessel disease remain stable. There is age related volume loss. No intracranial mass, hemorrhage, or extra-axial fluid collection. No acute appearing infarct. Areas of vascular calcification noted. Areas of paranasal sinus disease as summarized above. Electronically Signed   By: Lowella Grip III M.D.   On: 10/06/2016 08:18   Mr Brain Wo Contrast  Result Date: 10/06/2016 CLINICAL DATA:  77 year old male with confusion and slurred speech today. Possible dementia. EXAM: MRI HEAD WITHOUT CONTRAST TECHNIQUE: Multiplanar, multiecho pulse sequences of the brain and surrounding structures were obtained without  intravenous contrast. COMPARISON:  Head CT without contrast 0806 hours today. Brain MRI 08/14/2016. FINDINGS: Brain: No restricted diffusion to suggest acute infarction. No midline shift, mass effect, evidence of mass lesion, ventriculomegaly, extra-axial collection or acute intracranial hemorrhage. Cervicomedullary junction and pituitary are within normal limits. Confluent bilateral cerebral white matter T2 and FLAIR hyperintensity with several small areas which most resemble chronic white matter lacunar infarcts. Similar widespread T2 heterogeneity throughout the deep gray matter nuclei, the midbrain and pons, in part related to perivascular spaces. No new signal abnormality identified. Occasional chronic micro hemorrhages in the brain ir stable (right parietal lobe, left posterior temporal lobe). No new signal abnormality identified. Vascular: Major intracranial vascular flow voids are stable with generalized intracranial artery dolichoectasia. Skull and upper cervical spine: Negative. Normal bone marrow signal. Sinuses/Orbits: Normal orbits soft tissues. Stable paranasal sinuses and mastoids, with occasional mucosal thickening and small mucous retention cysts. Other: Visible internal auditory structures appear normal. Negative scalp soft tissues. IMPRESSION: 1. Stable noncontrast MRI appearance of the brain since April with no acute intracranial abnormality. 2. Widespread signal changes again suggestive of advanced chronic small vessel disease. Consider vascular causes of dementia. Electronically Signed   By: Genevie Ann M.D.   On: 10/06/2016 15:17   Mr Jodene Nam Abdomen Wo Contrast  Result Date: 10/18/2016 CLINICAL DATA:  76 year old male with a history of infrarenal abdominal aortic aneurysm measuring as large as 8.8 cm before treatment with endovascular repair. Repair performed 06/03/2009, with main body from the right. There was enlargement on follow-up CT 09/08/2013 with largest diameter at that time 9.6 cm.  Angiogram 10/13/2013 revealed no evidence of type 2 endoleak. Follow-up CT studies since the angiogram have demonstrated relatively stable size of the aneurysm sac, with greatest diameter measuring between 8.8 cm and 9 cm. Ultrasound performed 10/12/2016 demonstrates estimated diameter 9.4 cm. Patient presents for MR evaluation. Study was scheduled with gadolinium contrast, although the patient was unable to tolerate completion of the study and was performed without only. EXAM: MRI/MRA ABDOMEN WITHOUT CONTRAST TECHNIQUE: Multiplanar multisequence MR imaging was performed without the administration of intravenous contrast. COMPARISON:  Ultrasound 10/12/2016, most recent CT 12/28/2015. Initial CT 05/31/2009 FINDINGS: Lower chest: Bilateral pleural effusions Vascular: Re- demonstration of infrarenal abdominal aortic aneurysm with changes of endovascular repair/exclusion. Greatest diameter of the excluded aneurysm sac on the current MR estimated 9.1 cm (axial images). This is not significantly changed in size from the recent  CT of 12/28/2015 when the greatest diameter measured 8.9 cm. Nonvascular: T2 intense cyst of the hepatic dome measures 16 mm, unchanged from prior CT studies. Unremarkable MR appearance of small bowel and colon. Surgical changes of left nephrectomy. Unremarkable appearance of the right kidney with T2 intense cyst on the lateral cortex, unchanged from prior. Uniform marrow signal. Endplate changes compatible with Schmorl's nodes. No compression deformity. No bony canal narrowing. IMPRESSION: Re- demonstration of surgically treated infrarenal abdominal aortic aneurysm with stent graft. Greatest diameter of the excluded aneurysm sac is relatively unchanged compared to most recent CT studies, approximately 9.1 cm. Bilateral pleural effusions. Surgical changes of prior left nephrectomy. Signed, Dulcy Fanny. Earleen Newport, DO [facility] Electronically  Signed   By: Corrie Mckusick D.O.   On: 10/18/2016 14:35   US Aorta  Result Date: 10/13/2016 CLINICAL DATA:  Abdominal aortic aneurysm AA status post stent graft repair EXAM: ULTRASOUND OF ABDOMINAL AORTA TECHNIQUE: Ultrasound examination of the abdominal aorta was performed to evaluate for abdominal aortic aneurysm. COMPARISON:  12/28/2015 CT FINDINGS: Abdominal aortic measurements as follows: Proximal:  3.1 cm Mid:  9.4 cm Distal:  7.8 cm IMPRESSION: Compare to the prior study, max small sac diameter has increased from 8.8 cm to 9.4 cm. It is difficult to determine if this represents interval growth or simply inaccurate comparison due to differences in modality. CT of the abdomen would be a more accurate measure of a direct comparison to the prior study. Interval growth of the aneurysm sac cannot be excluded. Electronically Signed   By: Marybelle Killings M.D.   On: 10/13/2016 07:08   Dg Chest Port 1 View  Result Date: 10/12/2016 CLINICAL DATA:  Acute respiratory distress. EXAM: PORTABLE CHEST 1 VIEW COMPARISON:  Frontal and lateral views 10/06/2016 FINDINGS: The heart is enlarged. Unchanged mediastinal contours. Development of multifocal opacities throughout the right lung. Probable retrocardiac opacity. There is vascular congestion and mild peribronchial cuffing. Difficult to exclude pleural effusions. No pneumothorax. IMPRESSION: Development of multifocal opacities throughout the right lung and retrocardiac left lung base, may be pulmonary edema, pneumonia or aspiration. Difficult to exclude pleural effusions. Cardiomegaly with vascular congestion. Electronically Signed   By: Jeb Levering M.D.   On: 10/12/2016 04:20    ASSESSMENT/PLAN:  1. Physical deconditioning - for rehabilitation, PT and OT, for therapeutic strengthening exercises; fall precautions   2. Gastrointestinal hemorrhage associated with duodenal ulcer - EGD on 10/12/16 showed multiple nonbleeding Duodenal ulcers and no source of active  bleeding; continue Zantac 150 mg 1 tab by mouth twice a day and Protonix 40 mg 1 tab by mouth twice a day   3. Acute systolic CHF (congestive heart failure) [facility]) - Cardiology was consulted and was given IV Lasix and metolazone. He was diuresed 8.1L during hospital stay;  change Lasix solution to 40 mg 1 tab by mouth BID, Coreg 12.5 mg 1 tab PO BID, weigh daily and notify provider if +2 lbs weight gain, none ACEI/ARB candidate given her renal failure, continue isosorbide dinitrate 10 mg 1 tab by mouth TID, was started on digoxin 0.125 mg 1 tab by mouth daily 1 day then on 10/28/16 digoxin 0.0625 mg daily , check digoxin level in 2 weeks   4. Atrial fibrillation, chronic (HCC) - rate controlled; continue Coreg 12.5 mg 1 tab by mouth twice a day, was started on digoxin 0.125 mg 1 tab by mouth daily 1 day then on 10/28/16 will change to digoxin 0.0625 mg daily, Coumadin  5 mg by mouth daily except Wednesdays, and Coumadin 7.5 mg by mouth every Wednesdays   5. Anemia due to acute blood loss - Hemoglobin dropped from 11.3 to 8.3, S/P transfusion of 3 units packed RBC; check CBC on 10/30/16  6. Abdominal aortic aneurysm (AAA) without rupture ([place]) -  vascular surgery was consulted, his AAA has remained stable over time and no intervention is currently recommended, follow-up with vascular surgery in 6 months for repeat ultrasound   7. Atherosclerosis of native coronary artery of native heart without angina pectoris - no complaints of chest pain, continue isosorbide dinitrate 10 mg 1 tab by mouth 3 times a day and atorvastatin 40 mg 1 tab by mouth Q HS   8. Essential hypertension - BPs have been soft, decrease carvedilol to 12.5 mg 1 tab by mouth twice a day; BP/HR twice a day 1 week   9. Chronic respiratory failure with hypoxia (HCC) - continue O2 at 3 L/min via [place] continuously   10. Mild asthma without complication, unspecified whether persistent - no wheezing; continue pro-air HFA 90 g inhaler  inhale 2 puffs into the lungs every 6 hours when necessary and sulfa next 0.63 mg/3 mL solution one by L via nebulizer every 6 hours when necessary   11. Benign prostatic hyperplasia, unspecified whether lower urinary tract symptoms present - continue Flomax 0.4 mg 1 capsule by mouth daily   12. Hypokalemia - continue Klor-Con M20 one tab by mouth every evening Lab Results  Component Value Date   K 4.0 10/26/2016    13. Current moderate episode of major depressive disorder without prior episode (HCC) - mood is stable; continue Zoloft 100 mg 1 tab by mouth daily at bedtime   14. Insomnia, unspecified type - continue trazodone 50 mg 1 tab by mouth daily at bedtime   15. Vascular dementia without behavioral disturbance, unspecified timing of dementia onset - continue supportive care; fall precautions  16.  CKD, stage III - check BMP on 10/30/16 Lab Results  Component Value Date   CREATININE 2.22 (H) 10/26/2016       Goals of care:  Short-term rehabilitation    Sai Moura C. Milo - NP    [employer] [phone]

## 2016-10-30 LAB — CBC AND DIFFERENTIAL
HCT: 30 — AB (ref 41–53)
Hemoglobin: 9.5 — AB (ref 13.5–17.5)
Neutrophils Absolute: 3
PLATELETS: 157 (ref 150–399)
WBC: 4.7

## 2016-10-30 LAB — BASIC METABOLIC PANEL
BUN: 49 — AB (ref 4–21)
CREATININE: 2.3 — AB (ref 0.6–1.3)
GLUCOSE: 107
POTASSIUM: 3.5 (ref 3.4–5.3)
Sodium: 143 (ref 137–147)

## 2016-10-31 ENCOUNTER — Encounter: Payer: Self-pay | Admitting: Internal Medicine

## 2016-10-31 ENCOUNTER — Non-Acute Institutional Stay (SKILLED_NURSING_FACILITY): Payer: Medicare HMO | Admitting: Internal Medicine

## 2016-10-31 ENCOUNTER — Ambulatory Visit: Payer: Self-pay | Admitting: Neurology

## 2016-10-31 DIAGNOSIS — I5021 Acute systolic (congestive) heart failure: Secondary | ICD-10-CM | POA: Diagnosis not present

## 2016-10-31 DIAGNOSIS — K264 Chronic or unspecified duodenal ulcer with hemorrhage: Secondary | ICD-10-CM | POA: Diagnosis not present

## 2016-10-31 DIAGNOSIS — I482 Chronic atrial fibrillation, unspecified: Secondary | ICD-10-CM

## 2016-10-31 LAB — PROTIME-INR: Protime: 32.1 — AB (ref 10.0–13.8)

## 2016-10-31 LAB — POCT INR: INR: 3.2 — AB (ref 0.9–1.1)

## 2016-10-31 NOTE — Assessment & Plan Note (Addendum)
10/31/16 rate controlled; continue warfarin

## 2016-10-31 NOTE — Assessment & Plan Note (Signed)
PPI twice a day 

## 2016-10-31 NOTE — Patient Instructions (Signed)
See assessment and plan under each diagnosis in the problem list and acutely for this visit 

## 2016-10-31 NOTE — Progress Notes (Signed)
NURSING HOME LOCATION:   [facility] ROOM NUMBER:  216-A  CODE STATUS:  Full Code  PCP:  [name], NP  [address] [place] [id number]   This is a comprehensive admission note to [facility] performed on this date less than 30 days from date of admission. Included are preadmission medical/surgical history;reconciled medication list; family history; social history and comprehensive review of systems.  Corrections and additions to the records were documented . Comprehensive physical exam was also performed. Additionally a clinical summary was entered for each active diagnosis pertinent to this admission in the Problem List to enhance continuity of care.  HPI: The patient was hospitalized 6/6-/21/18 with acute gastrointestinal hemorrhage in the context of warfarin therapy for atrial fibrillation and history of GERD. He presented with tachycardia and melena when seen by PCP 6/6 for follow-up of progressive memory loss. Rate was noted to be in the 150s. At that point the patient validated weakness and melena for the previous 2-3 days. FOBT was positive, hemoglobin was 8.3, platelet count 138,000, and INR slightly below therapeutic range at 2.3. EKG revealed atrial fibrillation with a heart rate of 140. Significantly the patient had been admitted 6/2 for confusion and slurred speech. MRI was negative for acute CVA. EGD was performed 6/7, findings include a small hiatal hernia, moderate Schatzki ring and multiple nonbleeding superficial duodenal ulcers. The largest lesion was 6 mm largest dimension. H. pylori screen was negative. PPI was to be continued twice a day. The patient received 3 units of red packed cells, hemoglobin goal was 8 or higher. Despite aggressive treatment for congestive heart failure the patient continued to have exertional dyspnea. Echo 08/15/16 had revealed an ejection fraction of 20-25 percent with diffuse hypokinesis. Metolazone was  added to furosemide with diuresis of 8.1 L during the hospital stay. This was associated with a creatinine increase to 2.2. He was to be discharged on 40 mg of Lasix twice a day with an extra dose should he have 2 pound weight gain in 24 hours. He has permanent atrial fibrillation with rapid ventricular response. Initially warfarin was reversed and heparin bridging was completed. Digoxin was added for better heart rate control. Beta blocker could not be increased, as blood pressure tended to be low. The heart failure was superimposed on acute on chronic hypoxic respiratory failure. He is on 3 L nasal oxygen constantly. Incentive spirometry was prescribed for bilateral atelectasis.  Past medical and surgical history: Includes aortic aneurysm. No vascular-enteric  fistula was documented during the acute admission. He has had surgery for an infrarenal aortic aneurysm with stent grafting in 2011. He has advanced chronic cns small vessel disease as per MRI 6/1 compatible with vascular dementia. Other diagnoses include dyslipidemia, essential hypertension, BPH, depression, and history of renal cell carcinoma. He's had a left nephrectomy for cancer. Additionally the patient has diagnosis of sleep apnea, apparently CPAP was not pursued. He has had angioplasty and stent placement  & had an MI in 2006.   Social history: Quit smoking 1982. Occasional alcohol intake.  Family history: Reviewed  Review of systems: Family have reported progressive cognitive decline and feel he is no longer able to live on his own. SNF placement was therefore pursued.  The patient gave the date as 11/07/16 but named the president. He feels that endoscopy findings included "ulcers and several type things". He stated that the symptoms began as a "sensation of light and different" in reference to his bowel movement.  He stated he saw blood in the toilet water. This had been preceded by dyspnea on exertion climbing 3 flights of stairs at his  condo. He denies any bleeding dyscrasias @ this time except some bruising. He has had some intermittent increased thirst He states he has a history of migraines for which he takes [place] powders on average 10 a year. He describes moderate anxiety/depression. Constitutional: No fever,significant weight change, fatigue  Eyes: No redness, discharge, pain, vision change ENT/mouth: No nasal congestion,  purulent discharge, earache,change in hearing ,sore throat  Cardiovascular: No chest pain, palpitations,paroxysmal nocturnal dyspnea, claudication, edema  Respiratory: No sputum production,hemoptysis,  significant snoring,apnea   Gastrointestinal: No heartburn,dysphagia,abdominal pain, nausea / vomiting,rectal bleeding, melena,change in bowels presently Genitourinary: No dysuria,hematuria, pyuria,  incontinence, nocturia Musculoskeletal: No joint stiffness, joint swelling, weakness,pain Dermatologic: No rash, pruritus, change in appearance of skin Neurologic: No dizziness,syncope, seizures, numbness , tingling Endocrine: No change in hair/skin/ nails, excessive hunger, excessive urination  Hematologic/lymphatic: No significant lymphadenopathy,abnormal bleeding Allergy/immunology: No itchy/ watery eyes, significant sneezing, urticaria, angioedema  Physical exam:  Pertinent or positive findings: Hair is unwashed. He has a beard and mustache. Dental hygiene is fair. Heart is irregular but rate is controlled. He has minimal rhonchi and slight bronchovesicular quality to his respirations. 1+ pedal edema is present. Pedal pulses are decreased. He has DIP arthritic changes in his hands. There is flaccidity of the musculature of the upper arms  General appearance:Adequately nourished; no acute distress , increased work of breathing is present.   Lymphatic: No lymphadenopathy about the head, neck, axilla . Eyes: No conjunctival inflammation or lid edema is present. There is no scleral icterus. Ears:   External ear exam shows no significant lesions or deformities.   Nose:  External nasal examination shows no deformity or inflammation. Nasal mucosa are pink and moist without lesions ,exudates Oral exam: lips and gums are healthy appearing.There is no oropharyngeal erythema or exudate . Neck:  No thyromegaly, masses, tenderness noted.    Heart:  No gallop, murmur, click, rub .  Abdomen:Bowel sounds are normal. Abdomen is soft and nontender with no organomegaly, hernias,masses. GU: deferred  Extremities:  No cyanosis, clubbing,edema  Skin: Warm & dry w/o tenting. No significant lesions or rash.  See clinical summary under each active problem in the Problem List with associated updated therapeutic plan

## 2016-10-31 NOTE — Assessment & Plan Note (Signed)
Protocol as noted above

## 2016-11-01 ENCOUNTER — Ambulatory Visit: Payer: Self-pay | Admitting: Cardiology

## 2016-11-04 LAB — POCT INR: INR: 1.5 — AB (ref 0.9–1.1)

## 2016-11-04 LAB — PROTIME-INR: Protime: 18.1 — AB (ref 10.0–13.8)

## 2016-11-07 ENCOUNTER — Encounter: Payer: Self-pay | Admitting: Adult Health

## 2016-11-07 ENCOUNTER — Non-Acute Institutional Stay (SKILLED_NURSING_FACILITY): Payer: Medicare HMO | Admitting: Adult Health

## 2016-11-07 DIAGNOSIS — N183 Chronic kidney disease, stage 3 unspecified: Secondary | ICD-10-CM

## 2016-11-07 DIAGNOSIS — R600 Localized edema: Secondary | ICD-10-CM | POA: Diagnosis not present

## 2016-11-07 DIAGNOSIS — I5022 Chronic systolic (congestive) heart failure: Secondary | ICD-10-CM | POA: Diagnosis not present

## 2016-11-07 DIAGNOSIS — E876 Hypokalemia: Secondary | ICD-10-CM | POA: Diagnosis not present

## 2016-11-07 NOTE — Progress Notes (Signed)
DATE:  11/07/2016   MRN:  [id number]  BIRTHDAY: 1940-08-03  Facility:  Nursing Home Location:  [facility] Room Number: 216-A  LEVEL OF CARE:  SNF (31)  Contact Information    Name Relation Home Work Mobile   Holcomb Friend   [phone]   Liberato, [place]   [phone]       Code Status History    Date Active Date Inactive Code Status Order ID Comments User Context   10/11/2016  5:32 PM 10/26/2016  8:59 PM Full Code [id number]  Reyne Dumas, MD Inpatient   10/06/2016  5:45 PM 10/07/2016 10:12 PM Full Code [id number]  Janece Canterbury, MD Inpatient   08/14/2016  5:20 PM 08/22/2016  4:57 PM Full Code [id number]  Waldemar Dickens, MD Inpatient   11/06/2013  1:30 PM 11/08/2013  3:52 PM Full Code [id number]  Phebe Colla, MD Inpatient   10/13/2013  2:07 PM 10/14/2013  3:37 AM Full Code [id number]  Azzie Roup, MD [facility]       Chief Complaint  Patient presents with  . Acute Visit    BLE edema    HISTORY OF PRESENT ILLNESS:  This is a 76-YO male seen for an acute visit for bilateral lower extremity edema. He was seen in his room today. He has weight gain of 4.6 lbs since 11/03/16. He has chronic systolic CHF and currently on Lasix 40 mg daily. No SOB has been noted.    He has been admitted to [place] on 10/26/2016 from [facility] admission dates 10/11/16 thru 10/16/16 with Gastrointestinal hemorrhage. EGD on 10/12/16 showed multiple nonbleeding Duodenal ulcers and no source of active bleeding. Hemoglobin dropped from 11.3 to 8.3 . FOBT was positive. He was transfused 3 units of PRBC. Coumadin was held for a time however hemoglobin appears to be stable in the range of 8 so Coumadin was restarted. He was found to have acute on chronic systolic heart failure. Cardiology was consulted and was given IV Lasix and metolazone. He was diuresed 8.1L during hospital stay. He was later on shifted to oral Lasix 40 mg twice a day, metolazone  discontinued and started on digoxin. He has PMH of history of AAA, atrial fibrillation on Coumadin, CAD, COPD, GERD, renal cell carcinoma S/P nephrectomy, hypertension and dementia.   PAST MEDICAL HISTORY:  Past Medical History:  Diagnosis Date  . AAA (abdominal aortic aneurysm) (HCC)    9.1 cm repair 05/2009 ( ENDOVASCULAR REPAIR ).  MORE RECENT RE-EVALUATION OF ENLARGING AAA BY DR. Trula Slade AND BY DR. Kathlene Cote  MAY 2015 - PT STATES HE UNDERSTANDS THAT NOTHING IS LEAKING AT PRESENT TIME AND HE IS TO FOLLOW UP WITH HIS DOCTORS FOR FUTURE MONITORING I  . Arthritis    LEFT HAND, RT KNEE (08/14/2016)  . Basal cell carcinoma    "right side of my nose; think they burned it off" (08/14/2016)  . CAD (coronary artery disease)    a. s/p PCI to LAD/diagonal, subtotal of PDA 2006. b. Negative nuc 2014.  Marland Kitchen Chronic atrial fibrillation ([place])   . Chronic systolic CHF (congestive heart failure) (HCC)    a. EF 20-25% dx in 08/2016; etiology not yet clear.  . CKD (chronic kidney disease), stage III    a. stage III-IV, Cr baseline appears 1.8-2.0  . COPD (chronic obstructive pulmonary disease) ([place])   . Depression   . GERD (gastroesophageal reflux disease)   . History of kidney stones   .  History of pneumothorax    LONG TIME AGO  . Hyperlipidemia   . Hypertension   . Migraine    "very rare the last 20 years" (08/14/2016)  . Myocardial infarction ([place]) 2006  . Osteoarthritis   . Renal cell carcinoma ([place])   . Shingles outbreak    PT NOTICED SKIN RASH RT GROIN AND RIGHT TRUNK ON MONDAY 10/27/13 - NOT A LOT OF DISCOMFORT- NOT ON ANY ORAL MEDS TO TREAT.  . Sleep apnea    "dx'd; didn't try mask" (08/14/2016)  . Stroke [facility])    "CT showed I've had old strokes; never knew about it before today" (08/14/2016)     CURRENT MEDICATIONS: Reviewed  Patient's Medications  New Prescriptions   No medications on file  Previous Medications   ALBUTEROL (PROVENTIL HFA;VENTOLIN HFA) 108 (90 BASE) MCG/ACT INHALER    Inhale 2  puffs into the lungs every 6 (six) hours as needed for wheezing or shortness of breath.   ATORVASTATIN (LIPITOR) 40 MG TABLET    TAKE 1 TABLET ONE TIME DAILY   CARVEDILOL (COREG) 12.5 MG TABLET    Take 12.5 mg by mouth 2 (two) times daily.   COENZYME Q10 200 MG CAPSULE    Take 200 mg by mouth daily.   DIGOXIN 62.5 MCG TABS    Please take 0.125 mg oral daily for 1 days, then change to 0.0625 mg oral daily on 6/23.   FEEDING SUPPLEMENT, ENSURE ENLIVE, (ENSURE ENLIVE) LIQD    Take 237 mLs by mouth daily.   FUROSEMIDE (LASIX) 40 MG TABLET    Take 40 mg by mouth 2 (two) times daily.   ISOSORBIDE DINITRATE (ISORDIL) 10 MG TABLET    Take 1 tablet (10 mg total) by mouth 3 (three) times daily.   LEVALBUTEROL (XOPENEX) 0.63 MG/3ML NEBULIZER SOLUTION    Take 3 mLs (0.63 mg total) by nebulization every 6 (six) hours as needed for wheezing or shortness of breath.   MULTIPLE VITAMINS-MINERALS (MACUVITE EYE CARE PO)    Take 1 capsule by mouth daily.   OMEGA-3 FATTY ACIDS (FISH OIL) 1200 MG CAPS    Take 1,200 mg by mouth daily.    OXYGEN    Inhale 3 L/min into the lungs continuous.   PANTOPRAZOLE (PROTONIX) 40 MG TABLET    Take 1 tablet (40 mg total) by mouth 2 (two) times daily.   POLYETHYLENE GLYCOL (MIRALAX / GLYCOLAX) PACKET    Take 17 g by mouth daily as needed.   POTASSIUM CHLORIDE SA (K-DUR,KLOR-CON) 20 MEQ TABLET    Take 1 tablet (20 mEq total) by mouth every evening.   RANITIDINE (ZANTAC) 150 MG TABLET    Take 1 tablet (150 mg total) by mouth 2 (two) times daily.   SERTRALINE (ZOLOFT) 100 MG TABLET    Take 1 tablet (100 mg total) by mouth every evening.   TAMSULOSIN (FLOMAX) 0.4 MG CAPS CAPSULE    TAKE 1 CAPSULE (0.4 MG TOTAL) DAILY AFTER BREAKFAST.   TRAZODONE (DESYREL) 50 MG TABLET    Take 1 tablet (50 mg total) by mouth at bedtime.   WARFARIN SODIUM (COUMADIN PO)    Take 5-7.5 mg by mouth daily. Take 7.5 mg qd except Wednesdays, take 5 mg on Wednesdays  Modified Medications   No medications on file    Discontinued Medications   CARVEDILOL (COREG) 25 MG TABLET    Take 1 tablet (25 mg total) by mouth 2 (two) times daily with a meal.   WARFARIN (COUMADIN) 5  MG TABLET    TAKE 1 TABLET EVERY DAY EXCEPT 1.5 TABLETS ON WEDNESDAY.     Allergies  Allergen Reactions  . Ace Inhibitors Cough     REVIEW OF SYSTEMS:  GENERAL: no change in appetite, no fatigue, no weight changes, no fever, chills or weakness EYES: Denies change in vision, dry eyes, eye pain, itching or discharge EARS: Denies change in hearing, ringing in ears, or earache NOSE: Denies nasal congestion or epistaxis MOUTH and THROAT: Denies oral discomfort, gingival pain or bleeding, pain from teeth or hoarseness   RESPIRATORY: no cough, SOB, DOE, wheezing, hemoptysis CARDIAC: no chest pain or palpitations, +edema GI: no abdominal pain, diarrhea, constipation, heart burn, nausea or vomiting GU: Denies dysuria, frequency, hematuria, incontinence, or discharge PSYCHIATRIC: Denies feeling of depression or anxiety. No report of hallucinations, insomnia, paranoia, or agitation   PHYSICAL EXAMINATION  GENERAL APPEARANCE: Well nourished. In no acute distress. Obese SKIN:  Skin is warm and dry.  HEAD: Normal in size and contour. No evidence of trauma EYES: Lids open and close normally. No blepharitis, entropion or ectropion. PERRL. Conjunctivae are clear and sclerae are white. Lenses are without opacity EARS: Pinnae are normal. Patient hears normal voice tunes of the examiner MOUTH and THROAT: Lips are without lesions. Oral mucosa is moist and without lesions. Tongue is normal in shape, size, and color and without lesions NECK: supple, trachea midline, no neck masses, no thyroid tenderness, no thyromegaly LYMPHATICS: no LAN in the neck, no supraclavicular LAN RESPIRATORY: breathing is even & unlabored, BS CTAB CARDIAC: Irregular heart rate, no murmur,no extra heart sounds, BLE 2+ edema GI: abdomen soft, normal BS, no masses, no  tenderness, no hepatomegaly, no splenomegaly EXTREMITIES:  Able to move X 4 extremities PSYCHIATRIC: Alert and oriented X 3. Affect and behavior are appropriate   LABS/RADIOLOGY: Labs reviewed: Basic Metabolic Panel:  Recent Labs  08/14/16 1757  10/19/16 1648  10/20/16 1255 10/21/16 1610  10/23/16 0625 10/24/16 0536 10/26/16 0623 10/30/16  NA  --   < > 140  < >  --  140  < > 140 140 141 143  K  --   < > 3.4*  < >  --  4.3  < > 3.8 3.9 4.0 3.5  CL  --   < > 109  < >  --  104  < > 101 101 96*  --   CO2  --   < > 22  < >  --  28  < > 29 29 32  --   GLUCOSE  --   < > 81  < >  --  107*  < > 98 92 94  --   BUN  --   < > 26*  < >  --  28*  < > 29* 31* 36* 49*  CREATININE  --   < > 2.06*  < >  --  2.01*  < > 1.99* 1.87* 2.22* 2.3*  CALCIUM  --   < > 7.8*  < >  --  8.5*  < > 8.7* 8.7* 9.1  --   MG 2.1  < > 1.6*  --  1.8 2.0  --   --   --   --   --   PHOS 3.3  --   --   --   --   --   --   --   --   --   --   < > = values in this interval  not displayed. Liver Function Tests:  Recent Labs  10/11/16 1327 10/12/16 1133 10/15/16 0531  AST 24 23 21   ALT 26 23 17   ALKPHOS 56 53 61  BILITOT 1.2 2.0* 2.0*  PROT 5.4* 4.9* 5.1*  ALBUMIN 2.7* 2.5* 2.6*    Recent Labs  08/14/16 1303  AMMONIA 13   CBC:  Recent Labs  08/18/16 0354  10/11/16 1327  10/24/16 0536 10/25/16 0853 10/26/16 0623 10/30/16  WBC 5.4  < > 7.1  < > 4.3 4.3 5.1 4.7  NEUTROABS 3.7  --  5.6  --   --   --   --  3  HGB 11.1*  < > 8.3*  < > 8.5* 8.9* 8.3* 9.5*  HCT 34.9*  < > 26.3*  < > 27.5* 29.3* 27.2* 30*  MCV 95.1  < > 96.3  < > 94.8 93.3 93.2  --   PLT 143*  < > 138*  < > 155 150 157 157  < > = values in this interval not displayed. Lipid Panel:  Recent Labs  03/24/16 1012 10/07/16 0819  HDL 46.40 25*   Cardiac Enzymes:  Recent Labs  10/11/16 1327 10/11/16 1930 10/12/16 1133  TROPONINI 0.12* 0.16* 0.70*   CBG:  Recent Labs  08/18/16 1147 10/06/16 0802 10/07/16 1627  GLUCAP 101* 109*  106*      Dg Chest 2 View  Result Date: 10/17/2016 CLINICAL DATA:  Worsening shortness of breath over the past 2 months. Current history of hypertension, CHF, coronary artery disease, chronic kidney disease, atrial fibrillation and COPD. Former smoker. EXAM: CHEST  2 VIEW COMPARISON:  10/12/2016, 10/06/2016 and earlier. FINDINGS: Since the examination 5 days ago, interval resolution of interstitial pulmonary edema. Persistent bilateral pleural effusions and associated consolidation in the lower lobes and right middle lobe. No new abnormalities. Baseline fibrotic changes again demonstrated. Cardiac silhouette mildly to moderately enlarged, unchanged. Thoracic aorta atherosclerotic and mildly tortuous, unchanged. Hilar and mediastinal contours otherwise unremarkable. Degenerative changes and DISH involving the thoracic spine. IMPRESSION: 1. Persistent bilateral pleural effusions and associated passive atelectasis versus pneumonia involving the lower lobes bilaterally and the right middle lobe since the examination 5 days ago. 2. Interval resolution of the interstitial pulmonary edema present on the examination 5 days ago. 3. No new abnormalities. Electronically Signed   By: Evangeline Dakin M.D.   On: 10/17/2016 20:22   Mr Jodene Nam Abdomen Wo Contrast  Result Date: 10/18/2016 CLINICAL DATA:  76 year old male with a history of infrarenal abdominal aortic aneurysm measuring as large as 8.8 cm before treatment with endovascular repair. Repair performed 06/03/2009, with main body from the right. There was enlargement on follow-up CT 09/08/2013 with largest diameter at that time 9.6 cm. Angiogram 10/13/2013 revealed no evidence of type 2 endoleak. Follow-up CT studies since the angiogram have demonstrated relatively stable size of the aneurysm sac, with greatest diameter measuring between 8.8 cm and 9 cm. Ultrasound performed 10/12/2016 demonstrates estimated diameter 9.4 cm. Patient presents for MR evaluation. Study  was scheduled with gadolinium contrast, although the patient was unable to tolerate completion of the study and was performed without only. EXAM: MRI/MRA ABDOMEN WITHOUT CONTRAST TECHNIQUE: Multiplanar multisequence MR imaging was performed without the administration of intravenous contrast. COMPARISON:  Ultrasound 10/12/2016, most recent CT 12/28/2015. Initial CT 05/31/2009 FINDINGS: Lower chest: Bilateral pleural effusions Vascular: Re- demonstration of infrarenal abdominal aortic aneurysm with changes of endovascular repair/exclusion. Greatest diameter of the excluded aneurysm sac on the current MR estimated 9.1 cm (axial  images). This is not significantly changed in size from the recent CT of 12/28/2015 when the greatest diameter measured 8.9 cm. Nonvascular: T2 intense cyst of the hepatic dome measures 16 mm, unchanged from prior CT studies. Unremarkable MR appearance of small bowel and colon. Surgical changes of left nephrectomy. Unremarkable appearance of the right kidney with T2 intense cyst on the lateral cortex, unchanged from prior. Uniform marrow signal. Endplate changes compatible with Schmorl's nodes. No compression deformity. No bony canal narrowing. IMPRESSION: Re- demonstration of surgically treated infrarenal abdominal aortic aneurysm with stent graft. Greatest diameter of the excluded aneurysm sac is relatively unchanged compared to most recent CT studies, approximately 9.1 cm. Bilateral pleural effusions. Surgical changes of prior left nephrectomy. Signed, Dulcy Fanny. Earleen Newport, DO [facility] Radiology Electronically Signed   By: Corrie Mckusick D.O.   On: 10/18/2016 14:35   US Aorta  Result Date: 10/13/2016 CLINICAL DATA:  Abdominal aortic aneurysm AA status post stent graft repair EXAM: ULTRASOUND OF ABDOMINAL AORTA TECHNIQUE: Ultrasound examination of the abdominal aorta was performed to evaluate for abdominal aortic aneurysm. COMPARISON:   12/28/2015 CT FINDINGS: Abdominal aortic measurements as follows: Proximal:  3.1 cm Mid:  9.4 cm Distal:  7.8 cm IMPRESSION: Compare to the prior study, max small sac diameter has increased from 8.8 cm to 9.4 cm. It is difficult to determine if this represents interval growth or simply inaccurate comparison due to differences in modality. CT of the abdomen would be a more accurate measure of a direct comparison to the prior study. Interval growth of the aneurysm sac cannot be excluded. Electronically Signed   By: Marybelle Killings M.D.   On: 10/13/2016 07:08   Dg Chest Port 1 View  Result Date: 10/12/2016 CLINICAL DATA:  Acute respiratory distress. EXAM: PORTABLE CHEST 1 VIEW COMPARISON:  Frontal and lateral views 10/06/2016 FINDINGS: The heart is enlarged. Unchanged mediastinal contours. Development of multifocal opacities throughout the right lung. Probable retrocardiac opacity. There is vascular congestion and mild peribronchial cuffing. Difficult to exclude pleural effusions. No pneumothorax. IMPRESSION: Development of multifocal opacities throughout the right lung and retrocardiac left lung base, may be pulmonary edema, pneumonia or aspiration. Difficult to exclude pleural effusions. Cardiomegaly with vascular congestion. Electronically Signed   By: Jeb Levering M.D.   On: 10/12/2016 04:20    ASSESSMENT/PLAN:  1. Chronic systolic CHF (congestive heart failure) (HCC) - increase Lasix from 40 mg BID to 60 mg Q AM and 40 mg Q 5PM, continue Coreg 12.5 mg 1 tab by mouth twice a day and digoxin 0.0625 mg 1 tab by mouth daily   2. Bilateral lower extremity edema - encourage elevation of BLE, especially @ @ night when in bed, keep skin clean and dry, increase Lasix to 60 mg Q AM and 40 mg Q 5PM   3. Hypokalemia - increase KCL ER 20 meq 2 tabs = 40 meq PO Q TTHSatSun and 1 tab = 20 meq Q MWF; CMP on 11/13/16 Lab Results  Component Value Date   K 3.5 10/30/2016   4. Chronic kidney disease, stage III -  will monitor Lab Results  Component Value Date   CREATININE 2.3 (A) 10/30/2016         Tyisha Cressy C. [place] - NP    [employer] [phone]

## 2016-11-09 LAB — PROTIME-INR: Protime: 28 — AB (ref 10.0–13.8)

## 2016-11-09 LAB — POCT INR: INR: 2.7 — AB (ref 0.9–1.1)

## 2016-11-10 ENCOUNTER — Encounter: Payer: Self-pay | Admitting: Adult Health

## 2016-11-10 ENCOUNTER — Non-Acute Institutional Stay (SKILLED_NURSING_FACILITY): Payer: Medicare HMO | Admitting: Adult Health

## 2016-11-10 DIAGNOSIS — J189 Pneumonia, unspecified organism: Secondary | ICD-10-CM

## 2016-11-10 DIAGNOSIS — F22 Delusional disorders: Secondary | ICD-10-CM

## 2016-11-10 DIAGNOSIS — H1031 Unspecified acute conjunctivitis, right eye: Secondary | ICD-10-CM

## 2016-11-10 DIAGNOSIS — Z7901 Long term (current) use of anticoagulants: Secondary | ICD-10-CM | POA: Diagnosis not present

## 2016-11-10 NOTE — Progress Notes (Signed)
DATE:  11/10/2016   MRN:  [id number]  BIRTHDAY: 04-Jan-1941  Facility:  Nursing Home Location:  [facility] Room Number: 216-A  LEVEL OF CARE:  SNF (31)  Contact Information    Name Relation Home Work Mobile   [place] Friend   [phone]   [place], [place]   [phone]       Code Status History    Date Active Date Inactive Code Status Order ID Comments User Context   10/11/2016  5:32 PM 10/26/2016  8:59 PM Full Code [id number]  Reyne Dumas, MD Inpatient   10/06/2016  5:45 PM 10/07/2016 10:12 PM Full Code [id number]  Janece Canterbury, MD Inpatient   08/14/2016  5:20 PM 08/22/2016  4:57 PM Full Code [id number]  Waldemar Dickens, MD Inpatient   11/06/2013  1:30 PM 11/08/2013  3:52 PM Full Code [id number]  Phebe Colla, MD Inpatient   10/13/2013  2:07 PM 10/14/2013  3:37 AM Full Code [id number]  Azzie Roup, MD [facility]       Chief Complaint  Patient presents with  . Acute Visit    Pneumonia     HISTORY OF PRESENT ILLNESS:  This is a 76-YO male seen for an acute visit secondary to findings of pneumonia on chest x-ray. He reports having occasional productive cough with whitish/yellowish phlegm. No reported fever. CXR showed moderate right lower lobe infiltrate and/or effusion. He was recently started on Seroquel due to agitation/psychosis. He was also started on Erythromycin opthalmic ointment for right eye conjunctivitis. He has PMH of history of AAA, atrial fibrillation on Coumadin, CAD, COPD, GERD, renal cell carcinoma S/P nephrectomy, hypertension and dementia.    PAST MEDICAL HISTORY:  Past Medical History:  Diagnosis Date  . AAA (abdominal aortic aneurysm) (HCC)    9.1 cm repair 05/2009 ( ENDOVASCULAR REPAIR ).  MORE RECENT RE-EVALUATION OF ENLARGING AAA BY DR. Trula Slade AND BY DR. Kathlene Cote  MAY 2015 - PT STATES HE UNDERSTANDS THAT NOTHING IS LEAKING AT PRESENT TIME AND HE IS TO FOLLOW UP WITH HIS DOCTORS FOR FUTURE MONITORING I  . Arthritis    LEFT HAND,  RT KNEE (08/14/2016)  . Basal cell carcinoma    "right side of my nose; think they burned it off" (08/14/2016)  . CAD (coronary artery disease)    a. s/p PCI to LAD/diagonal, subtotal of PDA 2006. b. Negative nuc 2014.  Marland Kitchen Chronic atrial fibrillation ([place])   . Chronic systolic CHF (congestive heart failure) (HCC)    a. EF 20-25% dx in 08/2016; etiology not yet clear.  . CKD (chronic kidney disease), stage III    a. stage III-IV, Cr baseline appears 1.8-2.0  . COPD (chronic obstructive pulmonary disease) ([place])   . Depression   . GERD (gastroesophageal reflux disease)   . History of kidney stones   . History of pneumothorax    LONG TIME AGO  . Hyperlipidemia   . Hypertension   . Migraine    "very rare the last 20 years" (08/14/2016)  . Myocardial infarction ([place]) 2006  . Osteoarthritis   . Renal cell carcinoma ([place])   . Shingles outbreak    PT NOTICED SKIN RASH RT GROIN AND RIGHT TRUNK ON MONDAY 10/27/13 - NOT A LOT OF DISCOMFORT- NOT ON ANY ORAL MEDS TO TREAT.  . Sleep apnea    "dx'd; didn't try mask" (08/14/2016)  . Stroke [facility])    "CT showed I've had old strokes; never knew about it before today" (08/14/2016)  CURRENT MEDICATIONS: Reviewed  Patient's Medications  New Prescriptions   No medications on file  Previous Medications   ALBUTEROL (PROVENTIL HFA;VENTOLIN HFA) 108 (90 BASE) MCG/ACT INHALER    Inhale 2 puffs into the lungs every 6 (six) hours as needed for wheezing or shortness of breath.   ATORVASTATIN (LIPITOR) 40 MG TABLET    TAKE 1 TABLET ONE TIME DAILY   CARVEDILOL (COREG) 12.5 MG TABLET    Take 12.5 mg by mouth 2 (two) times daily.   COENZYME Q10 200 MG CAPSULE    Take 200 mg by mouth daily.   DIGOXIN 62.5 MCG TABS    Please take 0.125 mg oral daily for 1 days, then change to 0.0625 mg oral daily on 6/23.   FEEDING SUPPLEMENT, ENSURE ENLIVE, (ENSURE ENLIVE) LIQD    Take 237 mLs by mouth daily.   FUROSEMIDE (LASIX) 20 MG TABLET    Take 60 mg by mouth every morning. Take 3  tablets to = 60 mEq   FUROSEMIDE (LASIX) 40 MG TABLET    Take 40 mg by mouth every evening.    GUAIFENESIN (MUCINEX) 600 MG 12 HR TABLET    Take 600 mg by mouth 2 (two) times daily. x2 weeks, starting 11/09/16   ISOSORBIDE DINITRATE (ISORDIL) 10 MG TABLET    Take 1 tablet (10 mg total) by mouth 3 (three) times daily.   LEVALBUTEROL (XOPENEX) 0.63 MG/3ML NEBULIZER SOLUTION    Take 3 mLs (0.63 mg total) by nebulization every 6 (six) hours as needed for wheezing or shortness of breath.   MULTIPLE VITAMINS-MINERALS (MACUVITE EYE CARE PO)    Take 1 capsule by mouth daily.   OMEGA-3 FATTY ACIDS (FISH OIL) 1200 MG CAPS    Take 1,200 mg by mouth daily.    OXYGEN    Inhale 3 L/min into the lungs continuous.   PANTOPRAZOLE (PROTONIX) 40 MG TABLET    Take 1 tablet (40 mg total) by mouth 2 (two) times daily.   POLYETHYLENE GLYCOL (MIRALAX / GLYCOLAX) PACKET    Take 17 g by mouth daily as needed.   POTASSIUM CHLORIDE SA (K-DUR,KLOR-CON) 20 MEQ TABLET    Take 20-40 mEq by mouth. Take 1 tablet to = 20 mEq M-W-F, take 2 tablets to = 40 mEq Tue-Thu-Sat-Sun   QUETIAPINE (SEROQUEL) 25 MG TABLET    Take 25 mg by mouth 2 (two) times daily. Take 1 tablet at bedtime on 11/10/16 and then switch to the BID dosing.   RANITIDINE (ZANTAC) 150 MG TABLET    Take 1 tablet (150 mg total) by mouth 2 (two) times daily.   SERTRALINE (ZOLOFT) 100 MG TABLET    Take 1 tablet (100 mg total) by mouth every evening.   TAMSULOSIN (FLOMAX) 0.4 MG CAPS CAPSULE    TAKE 1 CAPSULE (0.4 MG TOTAL) DAILY AFTER BREAKFAST.   TRAZODONE (DESYREL) 50 MG TABLET    Take 1 tablet (50 mg total) by mouth at bedtime.   WARFARIN SODIUM (COUMADIN PO)    Take 5-7.5 mg by mouth daily. Take 7.5 mg qd except Wednesdays, take 5 mg on Wednesdays  Modified Medications   No medications on file  Discontinued Medications   POTASSIUM CHLORIDE SA (K-DUR,KLOR-CON) 20 MEQ TABLET    Take 1 tablet (20 mEq total) by mouth every evening.     Allergies  Allergen Reactions  .  Ace Inhibitors Cough     REVIEW OF SYSTEMS:  GENERAL: no change in appetite, no fatigue, no fever, chills or  weakness EYES: Denies change in vision, dry eyes, eye pain, itching or discharge EARS: Denies change in hearing, ringing in ears, or earache NOSE: Denies nasal congestion or epistaxis MOUTH and THROAT: Denies oral discomfort, gingival pain or bleeding, pain from teeth or hoarseness   RESPIRATORY: no wheezing, hemoptysis, +cough CARDIAC: no chest pain, or palpitations GI: no abdominal pain, diarrhea, constipation, heart burn, nausea or vomiting GU: Denies dysuria, frequency, hematuria, incontinence, or discharge PSYCHIATRIC: + agitation   PHYSICAL EXAMINATION  GENERAL APPEARANCE: Well nourished. In no acute distress. Normal body habitus SKIN:  Skin is warm and dry.  HEAD: Normal in size and contour. No evidence of trauma EYES: Right eye conjunctiva is erythematous EARS: Pinnae are normal. Patient hears normal voice tunes of the examiner MOUTH and THROAT: Lips are without lesions. Oral mucosa is moist and without lesions. Tongue is normal in shape, size, and color and without lesions NECK: supple, trachea midline, no neck masses, no thyroid tenderness, no thyromegaly LYMPHATICS: no LAN in the neck, no supraclavicular LAN RESPIRATORY: breathing is even & unlabored, on O2 @ 3L/min via [place] continuously CARDIAC: Irregular heart rate, no murmur,no extra heart sounds, BLE 2+ edema GI: abdomen soft, normal BS, no masses, no tenderness, no hepatomegaly, no splenomegaly EXTREMITIES:  Able to move X 4 extremities PSYCHIATRIC: Alert and oriented X 3. Affect and behavior are appropriate   LABS/RADIOLOGY: Labs reviewed: Basic Metabolic Panel:  Recent Labs  08/14/16 1757  10/19/16 1648  10/20/16 1255 10/21/16 6629  10/23/16 0625 10/24/16 0536 10/26/16 0623 10/30/16  NA  --   < > 140  < >  --  140  < > 140 140 141 143  K  --   < > 3.4*  < >  --  4.3  < > 3.8 3.9 4.0 3.5  CL  --    < > 109  < >  --  104  < > 101 101 96*  --   CO2  --   < > 22  < >  --  28  < > 29 29 32  --   GLUCOSE  --   < > 81  < >  --  107*  < > 98 92 94  --   BUN  --   < > 26*  < >  --  28*  < > 29* 31* 36* 49*  CREATININE  --   < > 2.06*  < >  --  2.01*  < > 1.99* 1.87* 2.22* 2.3*  CALCIUM  --   < > 7.8*  < >  --  8.5*  < > 8.7* 8.7* 9.1  --   MG 2.1  < > 1.6*  --  1.8 2.0  --   --   --   --   --   PHOS 3.3  --   --   --   --   --   --   --   --   --   --   < > = values in this interval not displayed. Liver Function Tests:  Recent Labs  10/11/16 1327 10/12/16 1133 10/15/16 0531  AST 24 23 21   ALT 26 23 17   ALKPHOS 56 53 61  BILITOT 1.2 2.0* 2.0*  PROT 5.4* 4.9* 5.1*  ALBUMIN 2.7* 2.5* 2.6*    Recent Labs  08/14/16 1303  AMMONIA 13   CBC:  Recent Labs  08/18/16 0354  10/11/16 1327  10/24/16 0536 10/25/16 4765 10/26/16 4650  10/30/16  WBC 5.4  < > 7.1  < > 4.3 4.3 5.1 4.7  NEUTROABS 3.7  --  5.6  --   --   --   --  3  HGB 11.1*  < > 8.3*  < > 8.5* 8.9* 8.3* 9.5*  HCT 34.9*  < > 26.3*  < > 27.5* 29.3* 27.2* 30*  MCV 95.1  < > 96.3  < > 94.8 93.3 93.2  --   PLT 143*  < > 138*  < > 155 150 157 157  < > = values in this interval not displayed. Lipid Panel:  Recent Labs  03/24/16 1012 10/07/16 0819  HDL 46.40 25*   Cardiac Enzymes:  Recent Labs  10/11/16 1327 10/11/16 1930 10/12/16 1133  TROPONINI 0.12* 0.16* 0.70*   CBG:  Recent Labs  08/18/16 1147 10/06/16 0802 10/07/16 1627  GLUCAP 101* 109* 106*      Dg Chest 2 View  Result Date: 10/17/2016 CLINICAL DATA:  Worsening shortness of breath over the past 2 months. Current history of hypertension, CHF, coronary artery disease, chronic kidney disease, atrial fibrillation and COPD. Former smoker. EXAM: CHEST  2 VIEW COMPARISON:  10/12/2016, 10/06/2016 and earlier. FINDINGS: Since the examination 5 days ago, interval resolution of interstitial pulmonary edema. Persistent bilateral pleural effusions and  associated consolidation in the lower lobes and right middle lobe. No new abnormalities. Baseline fibrotic changes again demonstrated. Cardiac silhouette mildly to moderately enlarged, unchanged. Thoracic aorta atherosclerotic and mildly tortuous, unchanged. Hilar and mediastinal contours otherwise unremarkable. Degenerative changes and DISH involving the thoracic spine. IMPRESSION: 1. Persistent bilateral pleural effusions and associated passive atelectasis versus pneumonia involving the lower lobes bilaterally and the right middle lobe since the examination 5 days ago. 2. Interval resolution of the interstitial pulmonary edema present on the examination 5 days ago. 3. No new abnormalities. Electronically Signed   By: Evangeline Dakin M.D.   On: 10/17/2016 20:22   Mr Jodene Nam Abdomen Wo Contrast  Result Date: 10/18/2016 CLINICAL DATA:  76 year old male with a history of infrarenal abdominal aortic aneurysm measuring as large as 8.8 cm before treatment with endovascular repair. Repair performed 06/03/2009, with main body from the right. There was enlargement on follow-up CT 09/08/2013 with largest diameter at that time 9.6 cm. Angiogram 10/13/2013 revealed no evidence of type 2 endoleak. Follow-up CT studies since the angiogram have demonstrated relatively stable size of the aneurysm sac, with greatest diameter measuring between 8.8 cm and 9 cm. Ultrasound performed 10/12/2016 demonstrates estimated diameter 9.4 cm. Patient presents for MR evaluation. Study was scheduled with gadolinium contrast, although the patient was unable to tolerate completion of the study and was performed without only. EXAM: MRI/MRA ABDOMEN WITHOUT CONTRAST TECHNIQUE: Multiplanar multisequence MR imaging was performed without the administration of intravenous contrast. COMPARISON:  Ultrasound 10/12/2016, most recent CT 12/28/2015. Initial CT 05/31/2009 FINDINGS: Lower chest: Bilateral pleural effusions Vascular: Re- demonstration of  infrarenal abdominal aortic aneurysm with changes of endovascular repair/exclusion. Greatest diameter of the excluded aneurysm sac on the current MR estimated 9.1 cm (axial images). This is not significantly changed in size from the recent CT of 12/28/2015 when the greatest diameter measured 8.9 cm. Nonvascular: T2 intense cyst of the hepatic dome measures 16 mm, unchanged from prior CT studies. Unremarkable MR appearance of small bowel and colon. Surgical changes of left nephrectomy. Unremarkable appearance of the right kidney with T2 intense cyst on the lateral cortex, unchanged from prior. Uniform marrow signal. Endplate changes compatible with Schmorl's  nodes. No compression deformity. No bony canal narrowing. IMPRESSION: Re- demonstration of surgically treated infrarenal abdominal aortic aneurysm with stent graft. Greatest diameter of the excluded aneurysm sac is relatively unchanged compared to most recent CT studies, approximately 9.1 cm. Bilateral pleural effusions. Surgical changes of prior left nephrectomy. Signed, Dulcy Fanny. Earleen Newport, DO [facility] Electronically Signed   By: Corrie Mckusick D.O.   On: 10/18/2016 14:35   US Aorta  Result Date: 10/13/2016 CLINICAL DATA:  Abdominal aortic aneurysm AA status post stent graft repair EXAM: ULTRASOUND OF ABDOMINAL AORTA TECHNIQUE: Ultrasound examination of the abdominal aorta was performed to evaluate for abdominal aortic aneurysm. COMPARISON:  12/28/2015 CT FINDINGS: Abdominal aortic measurements as follows: Proximal:  3.1 cm Mid:  9.4 cm Distal:  7.8 cm IMPRESSION: Compare to the prior study, max small sac diameter has increased from 8.8 cm to 9.4 cm. It is difficult to determine if this represents interval growth or simply inaccurate comparison due to differences in modality. CT of the abdomen would be a more accurate measure of a direct comparison to the prior study. Interval growth of the aneurysm  sac cannot be excluded. Electronically Signed   By: Marybelle Killings M.D.   On: 10/13/2016 07:08   Dg Chest Port 1 View  Result Date: 10/12/2016 CLINICAL DATA:  Acute respiratory distress. EXAM: PORTABLE CHEST 1 VIEW COMPARISON:  Frontal and lateral views 10/06/2016 FINDINGS: The heart is enlarged. Unchanged mediastinal contours. Development of multifocal opacities throughout the right lung. Probable retrocardiac opacity. There is vascular congestion and mild peribronchial cuffing. Difficult to exclude pleural effusions. No pneumothorax. IMPRESSION: Development of multifocal opacities throughout the right lung and retrocardiac left lung base, may be pulmonary edema, pneumonia or aspiration. Difficult to exclude pleural effusions. Cardiomegaly with vascular congestion. Electronically Signed   By: Jeb Levering M.D.   On: 10/12/2016 04:20    ASSESSMENT/PLAN:  1. HCAP (healthcare-associated pneumonia) - start Levaquin 500 mg 1 tab PO Q D X 10 days and Florastor 250 mg 1 capsular PO BID X 13 days  2. Acute bacterial conjunctivitis of right eye - continue Erythromycin opthalmic ointment instill ~ 1 cm ribbon into right eye 5X/day X 7 days  3. Delusional disorder ([place]) - continue Seroquel  4. Long-term use of anticoagulant - INR  2.7, therapeutic; start Coumadin 5 mg PO Q D except Wednesdays and Coumadin 7.5 mg PO Q Wednesdays, check INR on 11/13/16      Monina C. [place] - NP    [employer] [phone]

## 2016-11-13 LAB — BASIC METABOLIC PANEL
BUN: 40 — AB (ref 4–21)
BUN: 40 — AB (ref 4–21)
CREATININE: 2.5 — AB (ref 0.6–1.3)
Creatinine: 2.5 — AB (ref 0.6–1.3)
GLUCOSE: 77
GLUCOSE: 77
POTASSIUM: 3.9 (ref 3.4–5.3)
POTASSIUM: 3.9 (ref 3.4–5.3)
SODIUM: 144 (ref 137–147)
Sodium: 144 (ref 137–147)

## 2016-11-13 LAB — HEPATIC FUNCTION PANEL
ALK PHOS: 90 (ref 25–125)
ALT: 6 — AB (ref 10–40)
ALT: 6 — AB (ref 10–40)
AST: 12 — AB (ref 14–40)
AST: 12 — AB (ref 14–40)
Alkaline Phosphatase: 90 (ref 25–125)
BILIRUBIN, TOTAL: 1.1
Bilirubin, Total: 1.1

## 2016-11-14 ENCOUNTER — Encounter: Payer: Self-pay | Admitting: Adult Health

## 2016-11-14 ENCOUNTER — Non-Acute Institutional Stay (SKILLED_NURSING_FACILITY): Payer: Medicare HMO | Admitting: Adult Health

## 2016-11-14 DIAGNOSIS — I5022 Chronic systolic (congestive) heart failure: Secondary | ICD-10-CM | POA: Diagnosis not present

## 2016-11-14 DIAGNOSIS — N179 Acute kidney failure, unspecified: Secondary | ICD-10-CM

## 2016-11-14 DIAGNOSIS — N189 Chronic kidney disease, unspecified: Secondary | ICD-10-CM

## 2016-11-14 LAB — PROTIME-INR: PROTIME: 26 — AB (ref 10.0–13.8)

## 2016-11-14 LAB — POCT INR: INR: 2.5 — AB (ref 0.9–1.1)

## 2016-11-14 NOTE — Progress Notes (Signed)
This encounter was created in error - please disregard.

## 2016-11-14 NOTE — Progress Notes (Signed)
DATE:  11/14/2016   MRN:  [id number]  BIRTHDAY: 1940/12/11  Facility:  Nursing Home Location:  [facility] Room Number: 216-A  LEVEL OF CARE:  SNF (31)  Contact Information    Name Relation Home Work Mobile   Luverne Friend   [phone]   [place], [place]   [phone]       Code Status History    Date Active Date Inactive Code Status Order ID Comments User Context   10/11/2016  5:32 PM 10/26/2016  8:59 PM Full Code [id number]  Reyne Dumas, MD Inpatient   10/06/2016  5:45 PM 10/07/2016 10:12 PM Full Code [id number]  Janece Canterbury, MD Inpatient   08/14/2016  5:20 PM 08/22/2016  4:57 PM Full Code [id number]  Waldemar Dickens, MD Inpatient   11/06/2013  1:30 PM 11/08/2013  3:52 PM Full Code [id number]  Phebe Colla, MD Inpatient   10/13/2013  2:07 PM 10/14/2013  3:37 AM Full Code [id number]  Azzie Roup, MD [facility]       Chief Complaint  Patient presents with  . Acute Visit    Acute on chronic kidney disease    HISTORY OF PRESENT ILLNESS:  This is a 76-YO male seen for acute visit secondary to acute on chronic kidney disease. Latest creatinine 2.53 ( on 10/15/16 creatinine 2.0). He is currently on Lasix 60 mg Q AM and 40 mg Q PM for edema/chronic systolic CHF. He was seen in the room today. He is alert to self,place and month but not to year. Noted BLE edema to be better @ 1+. Latest weight is 207 lbs, compared to 212.0 lbs on 11/12/16. No SOB noted.    PAST MEDICAL HISTORY:  Past Medical History:  Diagnosis Date  . AAA (abdominal aortic aneurysm) (HCC)    9.1 cm repair 05/2009 ( ENDOVASCULAR REPAIR ).  MORE RECENT RE-EVALUATION OF ENLARGING AAA BY DR. Trula Slade AND BY DR. Kathlene Cote  MAY 2015 - PT STATES HE UNDERSTANDS THAT NOTHING IS LEAKING AT PRESENT TIME AND HE IS TO FOLLOW UP WITH HIS DOCTORS FOR FUTURE MONITORING I  . Arthritis    LEFT HAND, RT KNEE (08/14/2016)  . Basal cell carcinoma    "right side of my nose; think they burned it off" (08/14/2016)  .  CAD (coronary artery disease)    a. s/p PCI to LAD/diagonal, subtotal of PDA 2006. b. Negative nuc 2014.  Marland Kitchen Chronic atrial fibrillation ([place])   . Chronic systolic CHF (congestive heart failure) (HCC)    a. EF 20-25% dx in 08/2016; etiology not yet clear.  . CKD (chronic kidney disease), stage III    a. stage III-IV, Cr baseline appears 1.8-2.0  . COPD (chronic obstructive pulmonary disease) ([place])   . Depression   . GERD (gastroesophageal reflux disease)   . History of kidney stones   . History of pneumothorax    LONG TIME AGO  . Hyperlipidemia   . Hypertension   . Migraine    "very rare the last 20 years" (08/14/2016)  . Myocardial infarction ([place]) 2006  . Osteoarthritis   . Renal cell carcinoma ([place])   . Shingles outbreak    PT NOTICED SKIN RASH RT GROIN AND RIGHT TRUNK ON MONDAY 10/27/13 - NOT A LOT OF DISCOMFORT- NOT ON ANY ORAL MEDS TO TREAT.  . Sleep apnea    "dx'd; didn't try mask" (08/14/2016)  . Stroke [facility])    "CT showed I've had old strokes; never knew about it before  today" (08/14/2016)     CURRENT MEDICATIONS: Reviewed  Patient's Medications  New Prescriptions   No medications on file  Previous Medications   ALBUTEROL (PROVENTIL HFA;VENTOLIN HFA) 108 (90 BASE) MCG/ACT INHALER    Inhale 2 puffs into the lungs every 6 (six) hours as needed for wheezing or shortness of breath.   ATORVASTATIN (LIPITOR) 40 MG TABLET    TAKE 1 TABLET ONE TIME DAILY   CARVEDILOL (COREG) 12.5 MG TABLET    Take 12.5 mg by mouth 2 (two) times daily.   COENZYME Q10 200 MG CAPSULE    Take 200 mg by mouth daily.   DIGOXIN 62.5 MCG TABS    Please take 0.125 mg oral daily for 1 days, then change to 0.0625 mg oral daily on 6/23.   ERYTHROMYCIN OPHTHALMIC OINTMENT    Place 1 application into the right eye 5 (five) times daily. Instill 1 cm ribbon into right eye 5 times a day x7 days   FEEDING SUPPLEMENT, ENSURE ENLIVE, (ENSURE ENLIVE) LIQD    Take 237 mLs by mouth daily.   FUROSEMIDE (LASIX) 20 MG TABLET     Take 60 mg by mouth every morning. Take 3 tablets to = 60 mEq   FUROSEMIDE (LASIX) 40 MG TABLET    Take 40 mg by mouth every evening.    GUAIFENESIN (MUCINEX) 600 MG 12 HR TABLET    Take 600 mg by mouth 2 (two) times daily. x2 weeks, starting 11/09/16   ISOSORBIDE DINITRATE (ISORDIL) 10 MG TABLET    Take 1 tablet (10 mg total) by mouth 3 (three) times daily.   LEVALBUTEROL (XOPENEX) 0.63 MG/3ML NEBULIZER SOLUTION    Take 3 mLs (0.63 mg total) by nebulization every 6 (six) hours as needed for wheezing or shortness of breath.   LEVOFLOXACIN (LEVAQUIN) 500 MG TABLET    Take 500 mg by mouth daily.   MULTIPLE VITAMINS-MINERALS (MACUVITE EYE CARE PO)    Take 1 capsule by mouth daily.   OMEGA-3 FATTY ACIDS (FISH OIL) 1200 MG CAPS    Take 1,200 mg by mouth daily.    OXYGEN    Inhale 3 L/min into the lungs continuous.   PANTOPRAZOLE (PROTONIX) 40 MG TABLET    Take 1 tablet (40 mg total) by mouth 2 (two) times daily.   POLYETHYLENE GLYCOL (MIRALAX / GLYCOLAX) PACKET    Take 17 g by mouth daily as needed.   POTASSIUM CHLORIDE SA (K-DUR,KLOR-CON) 20 MEQ TABLET    Take 20-40 mEq by mouth. Take 1 tablet to = 20 mEq M-W-F, take 2 tablets to = 40 mEq Tue-Thu-Sat-Sun   QUETIAPINE (SEROQUEL) 25 MG TABLET    Take 25 mg by mouth 2 (two) times daily. Take 1 tablet at bedtime on 11/10/16 and then switch to the BID dosing.   RANITIDINE (ZANTAC) 150 MG TABLET    Take 1 tablet (150 mg total) by mouth 2 (two) times daily.   SERTRALINE (ZOLOFT) 100 MG TABLET    Take 1 tablet (100 mg total) by mouth every evening.   TAMSULOSIN (FLOMAX) 0.4 MG CAPS CAPSULE    TAKE 1 CAPSULE (0.4 MG TOTAL) DAILY AFTER BREAKFAST.   TRAZODONE (DESYREL) 50 MG TABLET    Take 1 tablet (50 mg total) by mouth at bedtime.   WARFARIN SODIUM (COUMADIN PO)    Take 5-7.5 mg by mouth daily. Take 7.5 mg on Wednesdays, take 5 mg all other days  Modified Medications   No medications on file  Discontinued Medications  No medications on file     Allergies    Allergen Reactions  . Ace Inhibitors Cough     REVIEW OF SYSTEMS:  GENERAL: no change in appetite, no fatigue, no weight changes, no fever, chills or weakness EYES: Denies change in vision, dry eyes, eye pain, itching or discharge EARS: Denies change in hearing, ringing in ears, or earache NOSE: Denies nasal congestion or epistaxis MOUTH and THROAT: Denies oral discomfort, gingival pain or bleeding, pain from teeth or hoarseness   RESPIRATORY: no cough, SOB, DOE, wheezing, hemoptysis CARDIAC: no chest pain or palpitations GI: no abdominal pain, diarrhea, constipation, heart burn, nausea or vomiting GU: Denies dysuria, frequency, hematuria, incontinence, or discharge PSYCHIATRIC: Denies feeling of depression or anxiety. No report of hallucinations, insomnia, paranoia, or agitation    PHYSICAL EXAMINATION  GENERAL APPEARANCE: Well nourished. In no acute distress. Normal body habitus SKIN:  Skin is warm and dry.  HEAD: Normal in size and contour. No evidence of trauma EYES: Lids open and close normally. No blepharitis, entropion or ectropion. PERRL. Conjunctivae are clear and sclerae are white. Lenses are without opacity EARS: Pinnae are normal. Patient hears normal voice tunes of the examiner MOUTH and THROAT: Lips are without lesions. Oral mucosa is moist and without lesions. Tongue is normal in shape, size, and color and without lesions NECK: supple, trachea midline, no neck masses, no thyroid tenderness, no thyromegaly LYMPHATICS: no LAN in the neck, no supraclavicular LAN RESPIRATORY: breathing is even & unlabored, BS CTAB CARDIAC: irregular heart rate, no murmur,no extra heart sounds, BLE1+ edema GI: abdomen soft, normal BS, no masses, no tenderness, no hepatomegaly, no splenomegaly EXTREMITIES:  Able to move X 4 extremities  PSYCHIATRIC: Alert to self, place, month, disoriented to year. Affect and behavior are appropriate   LABS/RADIOLOGY: Labs reviewed: Basic Metabolic  Panel:  Recent Labs  08/14/16 1757  10/19/16 1648  10/20/16 1255 10/21/16 8756  10/23/16 0625 10/24/16 0536 10/26/16 0623 10/30/16 11/13/16  NA  --   < > 140  < >  --  140  < > 140 140 141 143 144  K  --   < > 3.4*  < >  --  4.3  < > 3.8 3.9 4.0 3.5 3.9  CL  --   < > 109  < >  --  104  < > 101 101 96*  --   --   CO2  --   < > 22  < >  --  28  < > 29 29 32  --   --   GLUCOSE  --   < > 81  < >  --  107*  < > 98 92 94  --   --   BUN  --   < > 26*  < >  --  28*  < > 29* 31* 36* 49* 40*  CREATININE  --   < > 2.06*  < >  --  2.01*  < > 1.99* 1.87* 2.22* 2.3* 2.5*  CALCIUM  --   < > 7.8*  < >  --  8.5*  < > 8.7* 8.7* 9.1  --   --   MG 2.1  < > 1.6*  --  1.8 2.0  --   --   --   --   --   --   PHOS 3.3  --   --   --   --   --   --   --   --   --   --   --   < > =  values in this interval not displayed. Liver Function Tests:  Recent Labs  10/11/16 1327 10/12/16 1133 10/15/16 0531 11/13/16  AST 24 23 21  12*  ALT 26 23 17  6*  ALKPHOS 56 53 61 90  BILITOT 1.2 2.0* 2.0*  --   PROT 5.4* 4.9* 5.1*  --   ALBUMIN 2.7* 2.5* 2.6*  --     Recent Labs  08/14/16 1303  AMMONIA 13   CBC:  Recent Labs  08/18/16 0354  10/11/16 1327  10/24/16 0536 10/25/16 0853 10/26/16 0623 10/30/16  WBC 5.4  < > 7.1  < > 4.3 4.3 5.1 4.7  NEUTROABS 3.7  --  5.6  --   --   --   --  3  HGB 11.1*  < > 8.3*  < > 8.5* 8.9* 8.3* 9.5*  HCT 34.9*  < > 26.3*  < > 27.5* 29.3* 27.2* 30*  MCV 95.1  < > 96.3  < > 94.8 93.3 93.2  --   PLT 143*  < > 138*  < > 155 150 157 157  < > = values in this interval not displayed. Lipid Panel:  Recent Labs  03/24/16 1012 10/07/16 0819  HDL 46.40 25*   Cardiac Enzymes:  Recent Labs  10/11/16 1327 10/11/16 1930 10/12/16 1133  TROPONINI 0.12* 0.16* 0.70*   CBG:  Recent Labs  08/18/16 1147 10/06/16 0802 10/07/16 1627  GLUCAP 101* 109* 106*      Dg Chest 2 View  Result Date: 10/17/2016 CLINICAL DATA:  Worsening shortness of breath over the past 2  months. Current history of hypertension, CHF, coronary artery disease, chronic kidney disease, atrial fibrillation and COPD. Former smoker. EXAM: CHEST  2 VIEW COMPARISON:  10/12/2016, 10/06/2016 and earlier. FINDINGS: Since the examination 5 days ago, interval resolution of interstitial pulmonary edema. Persistent bilateral pleural effusions and associated consolidation in the lower lobes and right middle lobe. No new abnormalities. Baseline fibrotic changes again demonstrated. Cardiac silhouette mildly to moderately enlarged, unchanged. Thoracic aorta atherosclerotic and mildly tortuous, unchanged. Hilar and mediastinal contours otherwise unremarkable. Degenerative changes and DISH involving the thoracic spine. IMPRESSION: 1. Persistent bilateral pleural effusions and associated passive atelectasis versus pneumonia involving the lower lobes bilaterally and the right middle lobe since the examination 5 days ago. 2. Interval resolution of the interstitial pulmonary edema present on the examination 5 days ago. 3. No new abnormalities. Electronically Signed   By: Evangeline Dakin M.D.   On: 10/17/2016 20:22   Mr Jodene Nam Abdomen Wo Contrast  Result Date: 10/18/2016 CLINICAL DATA:  76 year old male with a history of infrarenal abdominal aortic aneurysm measuring as large as 8.8 cm before treatment with endovascular repair. Repair performed 06/03/2009, with main body from the right. There was enlargement on follow-up CT 09/08/2013 with largest diameter at that time 9.6 cm. Angiogram 10/13/2013 revealed no evidence of type 2 endoleak. Follow-up CT studies since the angiogram have demonstrated relatively stable size of the aneurysm sac, with greatest diameter measuring between 8.8 cm and 9 cm. Ultrasound performed 10/12/2016 demonstrates estimated diameter 9.4 cm. Patient presents for MR evaluation. Study was scheduled with gadolinium contrast, although the patient was unable to tolerate completion of the study and was  performed without only. EXAM: MRI/MRA ABDOMEN WITHOUT CONTRAST TECHNIQUE: Multiplanar multisequence MR imaging was performed without the administration of intravenous contrast. COMPARISON:  Ultrasound 10/12/2016, most recent CT 12/28/2015. Initial CT 05/31/2009 FINDINGS: Lower chest: Bilateral pleural effusions Vascular: Re- demonstration of infrarenal abdominal aortic aneurysm with changes of  endovascular repair/exclusion. Greatest diameter of the excluded aneurysm sac on the current MR estimated 9.1 cm (axial images). This is not significantly changed in size from the recent CT of 12/28/2015 when the greatest diameter measured 8.9 cm. Nonvascular: T2 intense cyst of the hepatic dome measures 16 mm, unchanged from prior CT studies. Unremarkable MR appearance of small bowel and colon. Surgical changes of left nephrectomy. Unremarkable appearance of the right kidney with T2 intense cyst on the lateral cortex, unchanged from prior. Uniform marrow signal. Endplate changes compatible with Schmorl's nodes. No compression deformity. No bony canal narrowing. IMPRESSION: Re- demonstration of surgically treated infrarenal abdominal aortic aneurysm with stent graft. Greatest diameter of the excluded aneurysm sac is relatively unchanged compared to most recent CT studies, approximately 9.1 cm. Bilateral pleural effusions. Surgical changes of prior left nephrectomy. Signed, Dulcy Fanny. Earleen Newport, DO [facility] Electronically Signed   By: Corrie Mckusick D.O.   On: 10/18/2016 14:35    ASSESSMENT/PLAN:  1. Chronic systolic CHF (congestive heart failure) (HCC) - discontinue Lasix, start Torsemide 20 mg 1 tab PO BID    2. Acute kidney injury superimposed on chronic kidney disease ([place]) - will discontinue Lasix and start Torsemide 20 mg 1 tab PO BID; BMP in 1 week Lab Results  Component Value Date   CREATININE 2.5 (A) 11/13/2016      Monina C. [place] - NP      [employer] [phone]

## 2016-11-20 ENCOUNTER — Non-Acute Institutional Stay (SKILLED_NURSING_FACILITY): Payer: Medicare HMO | Admitting: Adult Health

## 2016-11-20 ENCOUNTER — Encounter: Payer: Self-pay | Admitting: Adult Health

## 2016-11-20 DIAGNOSIS — I5022 Chronic systolic (congestive) heart failure: Secondary | ICD-10-CM | POA: Diagnosis not present

## 2016-11-20 DIAGNOSIS — F5102 Adjustment insomnia: Secondary | ICD-10-CM | POA: Diagnosis not present

## 2016-11-20 DIAGNOSIS — E785 Hyperlipidemia, unspecified: Secondary | ICD-10-CM

## 2016-11-20 DIAGNOSIS — Z8719 Personal history of other diseases of the digestive system: Secondary | ICD-10-CM

## 2016-11-20 DIAGNOSIS — I482 Chronic atrial fibrillation, unspecified: Secondary | ICD-10-CM

## 2016-11-20 DIAGNOSIS — J9611 Chronic respiratory failure with hypoxia: Secondary | ICD-10-CM | POA: Diagnosis not present

## 2016-11-20 DIAGNOSIS — F323 Major depressive disorder, single episode, severe with psychotic features: Secondary | ICD-10-CM | POA: Diagnosis not present

## 2016-11-20 DIAGNOSIS — N183 Chronic kidney disease, stage 3 unspecified: Secondary | ICD-10-CM

## 2016-11-20 DIAGNOSIS — F419 Anxiety disorder, unspecified: Secondary | ICD-10-CM | POA: Diagnosis not present

## 2016-11-20 DIAGNOSIS — Z7901 Long term (current) use of anticoagulants: Secondary | ICD-10-CM

## 2016-11-20 DIAGNOSIS — F0150 Vascular dementia without behavioral disturbance: Secondary | ICD-10-CM

## 2016-11-20 NOTE — Progress Notes (Signed)
DATE:  11/20/2016   MRN:  [id number]  BIRTHDAY: 1940-06-07  Facility:  Nursing Home Location:  [facility] Room Number: [id number]  LEVEL OF CARE:  SNF (31)  Contact Information    Name Relation Home Work Mobile   [place]   [phone]   [place], [place]   [phone]       Code Status History    Date Active Date Inactive Code Status Order ID Comments User Context   10/11/2016  5:32 PM 10/26/2016  8:59 PM Full Code [id number]  Reyne Dumas, MD Inpatient   10/06/2016  5:45 PM 10/07/2016 10:12 PM Full Code [id number]  Janece Canterbury, MD Inpatient   08/14/2016  5:20 PM 08/22/2016  4:57 PM Full Code [id number]  Waldemar Dickens, MD Inpatient   11/06/2013  1:30 PM 11/08/2013  3:52 PM Full Code [id number]  Phebe Colla, MD Inpatient   10/13/2013  2:07 PM 10/14/2013  3:37 AM Full Code [id number]  Azzie Roup, MD [facility]       Chief Complaint  Patient presents with  . Medical Management of Chronic Issues    Routine visit    HISTORY OF PRESENT ILLNESS:  This is a 69-YO male seen for a routine visit.  He is a long-term care resident of [facility].  He has PMH of history of AAA, atrial fibrillation on Coumadin, CAD, COPD, GERD, renal cell carcinoma S/P nephrectomy, hypertension and dementia. He was agitated last Saturday, 11/18/16. He got out of the front door wanting to go home.  He stated that he will hit anyone with the the O2 tank the he uses. 911 was called for assistance and was given Ativan 1 mg PO X 1 was given. He was seen in his room sleeping with O2 Nasal cannula on his cheeks. He was easily arousable and pleasant during the visit. Lasix was recently discontinued and started on Torsemide due to CKD. He was recently started on Seroquel for depressive psychosis/delusions and Levaquin for HCAP.    PAST MEDICAL HISTORY:  Past Medical History:  Diagnosis Date  . AAA (abdominal aortic aneurysm) (HCC)    9.1 cm repair 05/2009 (  ENDOVASCULAR REPAIR ).  MORE RECENT RE-EVALUATION OF ENLARGING AAA BY DR. Trula Slade AND BY DR. Kathlene Cote  MAY 2015 - PT STATES HE UNDERSTANDS THAT NOTHING IS LEAKING AT PRESENT TIME AND HE IS TO FOLLOW UP WITH HIS DOCTORS FOR FUTURE MONITORING I  . Arthritis    LEFT HAND, RT KNEE (08/14/2016)  . Basal cell carcinoma    "right side of my nose; think they burned it off" (08/14/2016)  . CAD (coronary artery disease)    a. s/p PCI to LAD/diagonal, subtotal of PDA 2006. b. Negative nuc 2014.  Marland Kitchen Chronic atrial fibrillation ([place])   . Chronic systolic CHF (congestive heart failure) (HCC)    a. EF 20-25% dx in 08/2016; etiology not yet clear.  . CKD (chronic kidney disease), stage III    a. stage III-IV, Cr baseline appears 1.8-2.0  . COPD (chronic obstructive pulmonary disease) ([place])   . Depression   . GERD (gastroesophageal reflux disease)   . History of kidney stones   . History of pneumothorax    LONG TIME AGO  . Hyperlipidemia   . Hypertension   . Migraine    "very rare the last 20 years" (08/14/2016)  . Myocardial infarction ([place]) 2006  . Osteoarthritis   . Renal cell carcinoma ([place])   . Shingles  outbreak    PT NOTICED SKIN RASH RT GROIN AND RIGHT TRUNK ON MONDAY 10/27/13 - NOT A LOT OF DISCOMFORT- NOT ON ANY ORAL MEDS TO TREAT.  . Sleep apnea    "dx'd; didn't try mask" (08/14/2016)  . Stroke [facility])    "CT showed I've had old strokes; never knew about it before today" (08/14/2016)     CURRENT MEDICATIONS: Reviewed  Patient's Medications  New Prescriptions   No medications on file  Previous Medications   ALBUTEROL (PROVENTIL HFA;VENTOLIN HFA) 108 (90 BASE) MCG/ACT INHALER    Inhale 2 puffs into the lungs every 6 (six) hours as needed for wheezing or shortness of breath.   ATORVASTATIN (LIPITOR) 40 MG TABLET    TAKE 1 TABLET ONE TIME DAILY   CARVEDILOL (COREG) 12.5 MG TABLET    Take 12.5 mg by mouth 2 (two) times daily.   COENZYME Q10 200 MG CAPSULE    Take 200 mg by mouth daily.   DIGOXIN 62.5  MCG TABS    Please take 0.125 mg oral daily for 1 days, then change to 0.0625 mg oral daily on 6/23.   FEEDING SUPPLEMENT, ENSURE ENLIVE, (ENSURE ENLIVE) LIQD    Take 237 mLs by mouth daily.   GUAIFENESIN (MUCINEX) 600 MG 12 HR TABLET    Take 600 mg by mouth 2 (two) times daily. x2 weeks, starting 11/09/16   ISOSORBIDE DINITRATE (ISORDIL) 10 MG TABLET    Take 1 tablet (10 mg total) by mouth 3 (three) times daily.   LEVALBUTEROL (XOPENEX) 0.63 MG/3ML NEBULIZER SOLUTION    Take 3 mLs (0.63 mg total) by nebulization every 6 (six) hours as needed for wheezing or shortness of breath.   LEVOFLOXACIN (LEVAQUIN) 500 MG TABLET    Take 500 mg by mouth daily. x10 days   MULTIPLE VITAMINS-MINERALS (MACUVITE EYE CARE PO)    Take 1 capsule by mouth daily.   OMEGA-3 FATTY ACIDS (FISH OIL) 1200 MG CAPS    Take 1,200 mg by mouth daily.    OXYGEN    Inhale 3 L/min into the lungs continuous.   PANTOPRAZOLE (PROTONIX) 40 MG TABLET    Take 1 tablet (40 mg total) by mouth 2 (two) times daily.   POLYETHYLENE GLYCOL (MIRALAX / GLYCOLAX) PACKET    Take 17 g by mouth daily as needed.   POTASSIUM CHLORIDE SA (K-DUR,KLOR-CON) 20 MEQ TABLET    Take 20-40 mEq by mouth. Take 1 tablet to = 20 mEq M-W-F, take 2 tablets to = 40 mEq Tue-Thu-Sat-Sun   QUETIAPINE (SEROQUEL) 25 MG TABLET    Take 25 mg by mouth 2 (two) times daily. Take 1 tablet at bedtime on 11/10/16 and then switch to the BID dosing.   RANITIDINE (ZANTAC) 150 MG TABLET    Take 1 tablet (150 mg total) by mouth 2 (two) times daily.   SACCHAROMYCES BOULARDII (FLORASTOR) 250 MG CAPSULE    Take 250 mg by mouth 2 (two) times daily.   SERTRALINE (ZOLOFT) 100 MG TABLET    Take 1 tablet (100 mg total) by mouth every evening.   TAMSULOSIN (FLOMAX) 0.4 MG CAPS CAPSULE    TAKE 1 CAPSULE (0.4 MG TOTAL) DAILY AFTER BREAKFAST.   TORSEMIDE (DEMADEX) 20 MG TABLET    Take 20 mg by mouth 2 (two) times daily.   TRAZODONE (DESYREL) 50 MG TABLET    Take 1 tablet (50 mg total) by mouth at  bedtime.   WARFARIN SODIUM (COUMADIN PO)    Take 5-7.5 mg  by mouth daily. Take 7.5 mg on Wednesdays, take 5 mg all other days  Modified Medications   No medications on file  Discontinued Medications   FUROSEMIDE (LASIX) 20 MG TABLET    Take 60 mg by mouth every morning. Take 3 tablets to = 60 mEq   FUROSEMIDE (LASIX) 40 MG TABLET    Take 40 mg by mouth every evening.      Allergies  Allergen Reactions  . Ace Inhibitors Cough     REVIEW OF SYSTEMS:  GENERAL: no change in appetite, no fatigue, no fever, chills or weakness EYES: Denies change in vision, dry eyes, eye pain, itching or discharge EARS: Denies change in hearing, ringing in ears, or earache NOSE: Denies nasal congestion or epistaxis MOUTH and THROAT: Denies oral discomfort, gingival pain or bleeding, pain from teeth or hoarseness   RESPIRATORY: no cough, SOB, DOE, wheezing, hemoptysis CARDIAC: no chest pain or palpitations, +edema GI: no abdominal pain, diarrhea, constipation, heart burn, nausea or vomiting GU: Denies dysuria, frequency, hematuria, incontinence, or discharge  PSYCHIATRIC: Denies feeling of depression or anxiety.+ agitation   PHYSICAL EXAMINATION  GENERAL APPEARANCE: Well nourished. In no acute distress. Normal body habitus SKIN:  Skin is warm and dry.  HEAD: Normal in size and contour. No evidence of trauma EYES: Lids open and close normally. No blepharitis, entropion or ectropion. PERRL. Conjunctivae are clear and sclerae are white. Lenses are without opacity EARS: Pinnae are normal. Patient hears normal voice tunes of the examiner MOUTH and THROAT: Lips are without lesions. Oral mucosa is moist and without lesions. Tongue is normal in shape, size, and color and without lesions NECK: supple, trachea midline, no neck masses, no thyroid tenderness, no thyromegaly LYMPHATICS: no LAN in the neck, no supraclavicular LAN RESPIRATORY: breathing is even & unlabored, BS CTAB, O2 @ 3L/min via [place]  CARDIAC:  Irregular heart rate, no murmur,no extra heart sounds, BLE 2+ edema GI: abdomen soft, normal BS, no masses, no tenderness, no hepatomegaly, no splenomegaly EXTREMITIES:  Able to move X 4 extremities PSYCHIATRIC: Alert and oriented X 3. Affect and behavior are appropriate   LABS/RADIOLOGY: Labs reviewed: Basic Metabolic Panel:  Recent Labs  08/14/16 1757  10/19/16 1648  10/20/16 1255 10/21/16 7062  10/23/16 0625 10/24/16 0536 10/26/16 0623 10/30/16 11/13/16  NA  --   < > 140  < >  --  140  < > 140 140 141 143 144  144  K  --   < > 3.4*  < >  --  4.3  < > 3.8 3.9 4.0 3.5 3.9  3.9  CL  --   < > 109  < >  --  104  < > 101 101 96*  --   --   CO2  --   < > 22  < >  --  28  < > 29 29 32  --   --   GLUCOSE  --   < > 81  < >  --  107*  < > 98 92 94  --   --   BUN  --   < > 26*  < >  --  28*  < > 29* 31* 36* 49* 40*  40*  CREATININE  --   < > 2.06*  < >  --  2.01*  < > 1.99* 1.87* 2.22* 2.3* 2.5*  2.5*  CALCIUM  --   < > 7.8*  < >  --  8.5*  < >  8.7* 8.7* 9.1  --   --   MG 2.1  < > 1.6*  --  1.8 2.0  --   --   --   --   --   --   PHOS 3.3  --   --   --   --   --   --   --   --   --   --   --   < > = values in this interval not displayed. Liver Function Tests:  Recent Labs  10/11/16 1327 10/12/16 1133 10/15/16 0531 11/13/16  AST 24 23 21  12*  12*  ALT 26 23 17  6*  6*  ALKPHOS 56 53 61 90  90  BILITOT 1.2 2.0* 2.0*  --   PROT 5.4* 4.9* 5.1*  --   ALBUMIN 2.7* 2.5* 2.6*  --     Recent Labs  08/14/16 1303  AMMONIA 13   CBC:  Recent Labs  08/18/16 0354  10/11/16 1327  10/24/16 0536 10/25/16 0853 10/26/16 0623 10/30/16  WBC 5.4  < > 7.1  < > 4.3 4.3 5.1 4.7  NEUTROABS 3.7  --  5.6  --   --   --   --  3  HGB 11.1*  < > 8.3*  < > 8.5* 8.9* 8.3* 9.5*  HCT 34.9*  < > 26.3*  < > 27.5* 29.3* 27.2* 30*  MCV 95.1  < > 96.3  < > 94.8 93.3 93.2  --   PLT 143*  < > 138*  < > 155 150 157 157  < > = values in this interval not displayed. Lipid Panel:  Recent Labs   03/24/16 1012 10/07/16 0819  HDL 46.40 25*   Cardiac Enzymes:  Recent Labs  10/11/16 1327 10/11/16 1930 10/12/16 1133  TROPONINI 0.12* 0.16* 0.70*   CBG:  Recent Labs  08/18/16 1147 10/06/16 0802 10/07/16 1627  GLUCAP 101* 109* 106*     ASSESSMENT/PLAN:  1. Chronic kidney disease, stage III (moderate) -  Recently changed Lasix to Torsemide 20 mg  BID Lab Results  Component Value Date   CREATININE 2.5 (A) 11/13/2016   CREATININE 2.5 (A) 11/13/2016    2. Atrial fibrillation, chronic (HCC) - rate-controlled; continue Coreg 12.5 mg 1 tab PO BID and Coumadin   3. History of GI bleed - no reported bleeding; continue Pantoprazole 40 mg 1 tab PO BID and Ranitidine 150 mg 1 tab BID   4. Chronic systolic CHF (congestive heart failure) (HCC) - no SOB; continue Coreg 12.5 mg 1 tab by mouth twice a day, torsemide 20 mg 1 tab by mouth twice a day and KCL ER supllementation   5. 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 Adjustment insomnia -  Continue Trazodone 50 mg 1 tab Q HS   8. Hyperlipidemia, unspecified hyperlipidemia type - continue Atorvastatin 40 mg 1 tab Q D Lab Results  Component Value Date   CHOL 98 10/07/2016   HDL 25 (L) 10/07/2016   LDLCALC 54 10/07/2016   TRIG 93 10/07/2016   CHOLHDL 3.9 10/07/2016     9. Vascular dementia - continue supportive care, fall precautions  10. Long-term use of anticoagulant - INR 2.1,  continue Coumadin 5 mg Q D except Wednesdays and Coumadin 7.5 mg Q Wednesdays, INR 11/23/16  11. Anxiety - will start on Ativan 0.5 mg 1 tab Q D PRN X 1 week,; he is being followed- up by [facility] NP     Goals of care:  Short-term rehabilitation    Monina C. Worthington - NP    [employer] [phone]

## 2016-11-21 LAB — BASIC METABOLIC PANEL
BUN: 27 — AB (ref 4–21)
CREATININE: 2.5 — AB (ref 0.6–1.3)
Glucose: 100
Potassium: 3.6 (ref 3.4–5.3)
Sodium: 144 (ref 137–147)

## 2016-11-22 DIAGNOSIS — F0151 Vascular dementia with behavioral disturbance: Secondary | ICD-10-CM | POA: Diagnosis not present

## 2016-11-22 DIAGNOSIS — F323 Major depressive disorder, single episode, severe with psychotic features: Secondary | ICD-10-CM | POA: Diagnosis not present

## 2016-11-22 DIAGNOSIS — F22 Delusional disorders: Secondary | ICD-10-CM | POA: Diagnosis not present

## 2016-11-23 ENCOUNTER — Encounter: Payer: Self-pay | Admitting: Adult Health

## 2016-11-23 ENCOUNTER — Non-Acute Institutional Stay (SKILLED_NURSING_FACILITY): Payer: Medicare HMO | Admitting: Adult Health

## 2016-11-23 DIAGNOSIS — Z7901 Long term (current) use of anticoagulants: Secondary | ICD-10-CM | POA: Diagnosis not present

## 2016-11-23 DIAGNOSIS — I482 Chronic atrial fibrillation, unspecified: Secondary | ICD-10-CM

## 2016-11-23 NOTE — Progress Notes (Signed)
Subjective:     Indication: atrial fibrillation Bleeding signs/symptoms: None Thromboembolic signs/symptoms: None  Missed Coumadin doses: None Medication changes: no Dietary changes: no Bacterial/viral infection: no Other concerns: no  The following portions of the patient's history were reviewed and updated as appropriate: allergies, current medications, past family history, past medical history, past social history, past surgical history and problem list.  Review of Systems A comprehensive review of systems was negative.   Objective:    INR Today: 1.5 Current dose: Coumadin 5 mg PO Q D except Wednesdays, Coumadin 7.5 mg PO Q Wednesdays    Assessment:    Subtherapeutic INR for goal of 2-3   Plan:    1. New dose: Coumadin 7.5 mg 1 tab PO Q Thursday, Friday and Saturday, Coumadin 5 mg 1 tab PO Q Sunday, Monday Tuesday and Wednesdays   2. Next INR: 11/27/16

## 2016-11-27 ENCOUNTER — Non-Acute Institutional Stay: Payer: Medicare HMO | Admitting: Adult Health

## 2016-11-27 ENCOUNTER — Encounter: Payer: Self-pay | Admitting: Adult Health

## 2016-11-27 DIAGNOSIS — F323 Major depressive disorder, single episode, severe with psychotic features: Secondary | ICD-10-CM

## 2016-11-27 DIAGNOSIS — R5381 Other malaise: Secondary | ICD-10-CM | POA: Diagnosis not present

## 2016-11-27 DIAGNOSIS — I482 Chronic atrial fibrillation, unspecified: Secondary | ICD-10-CM

## 2016-11-27 DIAGNOSIS — I2510 Atherosclerotic heart disease of native coronary artery without angina pectoris: Secondary | ICD-10-CM | POA: Diagnosis not present

## 2016-11-27 DIAGNOSIS — Z7901 Long term (current) use of anticoagulants: Secondary | ICD-10-CM

## 2016-11-27 DIAGNOSIS — I5022 Chronic systolic (congestive) heart failure: Secondary | ICD-10-CM | POA: Diagnosis not present

## 2016-11-27 DIAGNOSIS — R0609 Other forms of dyspnea: Secondary | ICD-10-CM

## 2016-11-27 DIAGNOSIS — E785 Hyperlipidemia, unspecified: Secondary | ICD-10-CM

## 2016-11-27 DIAGNOSIS — F5102 Adjustment insomnia: Secondary | ICD-10-CM | POA: Diagnosis not present

## 2016-11-27 DIAGNOSIS — N183 Chronic kidney disease, stage 3 unspecified: Secondary | ICD-10-CM

## 2016-11-27 DIAGNOSIS — N400 Enlarged prostate without lower urinary tract symptoms: Secondary | ICD-10-CM | POA: Diagnosis not present

## 2016-11-27 DIAGNOSIS — Z8719 Personal history of other diseases of the digestive system: Secondary | ICD-10-CM

## 2016-11-27 DIAGNOSIS — J9611 Chronic respiratory failure with hypoxia: Secondary | ICD-10-CM

## 2016-11-27 NOTE — Progress Notes (Signed)
DATE:  11/27/2016   MRN:  [id number]  BIRTHDAY: November 15, 1940  Facility:  Nursing Home Location:  [facility] Room Number: 216-A  LEVEL OF CARE:  SNF (31)  Contact Information    Name Relation Home Work Mobile   [place] Friend   [phone]   Damani, Kelemen   [phone]       Code Status History    Date Active Date Inactive Code Status Order ID Comments User Context   10/11/2016  5:32 PM 10/26/2016  8:59 PM Full Code [id number]  Reyne Dumas, MD Inpatient   10/06/2016  5:45 PM 10/07/2016 10:12 PM Full Code [id number]  Janece Canterbury, MD Inpatient   08/14/2016  5:20 PM 08/22/2016  4:57 PM Full Code [id number]  Waldemar Dickens, MD Inpatient   11/06/2013  1:30 PM 11/08/2013  3:52 PM Full Code [id number]  Phebe Colla, MD Inpatient   10/13/2013  2:07 PM 10/14/2013  3:37 AM Full Code [id number]  Azzie Roup, MD [facility]      Chief Complaint  Patient presents with  . Acute visit    For supposed discharge but no placement at this time    HISTORY OF PRESENT ILLNESS:  This is a 75-YO male who is supposedly for discharge but has no placement at this time. Social worker and family currently looking for placement.   He has been admitted to [facility] on 10/26/2016 from [facility] admission dates 10/11/16 thru 10/16/16 with Gastrointestinal hemorrhage. EGD on 10/12/16 showed multiple nonbleeding Duodenal ulcers and no source of active bleeding. Hemoglobin dropped from 11.3 to 8.3 . FOBT was positive. He was transfused 3 units of PRBC. Coumadin was held for a time however hemoglobin appears to be stable in the range of 8 so Coumadin was restarted. He was found to have acute on chronic systolic heart failure. Cardiology was consulted and was given IV Lasix and metolazone. He was diuresed 8.1L during hospital stay. He was later on shifted to oral Lasix 40 mg twice a day, metolazone discontinued and started on digoxin.   Lasix has been discontinued  and shifted to Torsemide due to CKD. 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 He has a PMH of AAA, atrial fibrillation on Coumadin, CAD, COPD, GERD, renal cell carcinoma S/P nephrectomy, hypertension and dementia.  Patient was admitted to this facility for short-term rehabilitation after the patient's recent hospitalization.    PAST MEDICAL HISTORY:  Past Medical History:  Diagnosis Date  . AAA (abdominal aortic aneurysm) (HCC)    9.1 cm repair 05/2009 ( ENDOVASCULAR REPAIR ).  MORE RECENT RE-EVALUATION OF ENLARGING AAA BY DR. Trula Slade AND BY DR. Kathlene Cote  MAY 2015 - PT STATES HE UNDERSTANDS THAT NOTHING IS LEAKING AT PRESENT TIME AND HE IS TO FOLLOW UP WITH HIS DOCTORS FOR FUTURE MONITORING I  . Arthritis    LEFT HAND, RT KNEE (08/14/2016)  . Basal cell carcinoma    "right side of my nose; think they burned it off" (08/14/2016)  . CAD (coronary artery disease)    a. s/p PCI to LAD/diagonal, subtotal of PDA 2006. b. Negative nuc 2014.  Marland Kitchen Chronic atrial fibrillation ([place])   . Chronic systolic CHF (congestive heart failure) (HCC)    a. EF 20-25% dx in 08/2016; etiology not yet clear.  . CKD (chronic kidney disease), stage III    a. stage III-IV, Cr baseline appears 1.8-2.0  . COPD (  chronic obstructive pulmonary disease) ([place])   . Depression   . GERD (gastroesophageal reflux disease)   . History of kidney stones   . History of pneumothorax    LONG TIME AGO  . Hyperlipidemia   . Hypertension   . Migraine    "very rare the last 20 years" (08/14/2016)  . Myocardial infarction ([place]) 2006  . Osteoarthritis   . Renal cell carcinoma ([place])   . Shingles outbreak    PT NOTICED SKIN RASH RT GROIN AND RIGHT TRUNK ON MONDAY 10/27/13 - NOT A LOT OF DISCOMFORT- NOT ON ANY ORAL MEDS TO TREAT.  . Sleep apnea    "dx'd; didn't try mask" (08/14/2016)  . Stroke [facility])    "CT showed I've had old strokes; never knew about it before today"  (08/14/2016)     CURRENT MEDICATIONS: Reviewed  Patient's Medications  New Prescriptions   No medications on file  Previous Medications   ALBUTEROL (PROVENTIL HFA;VENTOLIN HFA) 108 (90 BASE) MCG/ACT INHALER    Inhale 2 puffs into the lungs every 6 (six) hours as needed for wheezing or shortness of breath.   ATORVASTATIN (LIPITOR) 40 MG TABLET    TAKE 1 TABLET ONE TIME DAILY   CARVEDILOL (COREG) 12.5 MG TABLET    Take 12.5 mg by mouth 2 (two) times daily.   COENZYME Q10 200 MG CAPSULE    Take 200 mg by mouth daily.   DIGOXIN 62.5 MCG TABS    Please take 0.125 mg oral daily for 1 days, then change to 0.0625 mg oral daily on 6/23.   FEEDING SUPPLEMENT, ENSURE ENLIVE, (ENSURE ENLIVE) LIQD    Take 237 mLs by mouth daily.   GUAIFENESIN (MUCINEX) 600 MG 12 HR TABLET    Take 600 mg by mouth 2 (two) times daily.    ISOSORBIDE DINITRATE (ISORDIL) 10 MG TABLET    Take 1 tablet (10 mg total) by mouth 3 (three) times daily.   LEVALBUTEROL (XOPENEX) 0.63 MG/3ML NEBULIZER SOLUTION    Take 3 mLs (0.63 mg total) by nebulization every 6 (six) hours as needed for wheezing or shortness of breath.   MULTIPLE VITAMINS-MINERALS (MACUVITE EYE CARE PO)    Take 1 capsule by mouth daily.   OMEGA-3 FATTY ACIDS (FISH OIL) 1200 MG CAPS    Take 1,200 mg by mouth daily.    OXYGEN    Inhale 3 L/min into the lungs continuous.   PANTOPRAZOLE (PROTONIX) 40 MG TABLET    Take 1 tablet (40 mg total) by mouth 2 (two) times daily.   POLYETHYLENE GLYCOL (MIRALAX / GLYCOLAX) PACKET    Take 17 g by mouth daily as needed.   POTASSIUM CHLORIDE SA (K-DUR,KLOR-CON) 20 MEQ TABLET    Take 20-40 mEq by mouth. Take 1 tablet to = 20 mEq M-W-F, take 2 tablets to = 40 mEq Tue-Thu-Sat-Sun   QUETIAPINE (SEROQUEL) 50 MG TABLET    Take 50 mg by mouth 2 (two) times daily.   RANITIDINE (ZANTAC) 150 MG TABLET    Take 1 tablet (150 mg total) by mouth 2 (two) times daily.   SERTRALINE (ZOLOFT) 100 MG TABLET    Take 1 tablet (100 mg total) by mouth every  evening.   TAMSULOSIN (FLOMAX) 0.4 MG CAPS CAPSULE    TAKE 1 CAPSULE (0.4 MG TOTAL) DAILY AFTER BREAKFAST.   TORSEMIDE (DEMADEX) 20 MG TABLET    Take 20 mg by mouth 2 (two) times daily.   TRAZODONE (DESYREL) 50 MG TABLET    Take  25-50 mg by mouth. Take 1/2 tablet to = 25 mg bid prn, 1 tablet to = 50 mg qhs   WARFARIN SODIUM (COUMADIN PO)    Take 5-7.5 mg by mouth daily. Take 5 mg Sun-Mon-Tues-Wed, take 7.5 mg Thur-Fri-Sat for atrial fibrillation  Modified Medications   No medications on file  Discontinued Medications   LEVOFLOXACIN (LEVAQUIN) 500 MG TABLET    Take 500 mg by mouth daily. x10 days   QUETIAPINE (SEROQUEL) 25 MG TABLET    Take 25 mg by mouth 2 (two) times daily.    TRAZODONE (DESYREL) 25 MG TABS TABLET    Take 25 mg by mouth 2 (two) times daily.   TRAZODONE (DESYREL) 50 MG TABLET    Take 1 tablet (50 mg total) by mouth at bedtime.     Allergies  Allergen Reactions  . Ace Inhibitors Cough     REVIEW OF SYSTEMS:  GENERAL: no change in appetite, no fatigue, no weight changes, no fever, chills or weakness EYES: Denies change in vision, dry eyes, eye pain, itching or discharge EARS: Denies change in hearing, ringing in ears, or earache NOSE: Denies nasal congestion or epistaxis MOUTH and THROAT: Denies oral discomfort, gingival pain or bleeding, pain from teeth or hoarseness   RESPIRATORY: no cough, SOB, DOE, wheezing, hemoptysis CARDIAC: no chest pain or palpitations GI: no abdominal pain, diarrhea, constipation, heart burn, nausea or vomiting GU: Denies dysuria, frequency, hematuria, incontinence, or discharge    PHYSICAL EXAMINATION   GENERAL APPEARANCE: Well nourished. In no acute distress. Normal body habitus SKIN:  Skin is warm and dry.  HEAD: Normal in size and contour. No evidence of trauma EYES: Lids open and close normally. No blepharitis, entropion or ectropion. PERRL. Conjunctivae are clear and sclerae are white. Lenses are without opacity EARS: Pinnae are  normal. Patient hears normal voice tunes of the examiner MOUTH and THROAT: Lips are without lesions. Oral mucosa is moist and without lesions. Tongue is normal in shape, size, and color and without lesions NECK: supple, trachea midline, no neck masses, no thyroid tenderness, no thyromegaly LYMPHATICS: no LAN in the neck, no supraclavicular LAN RESPIRATORY: breathing is even & unlabored, BS CTAB CARDIAC: Irregular heart rate, no murmur,no extra heart sounds, BLE 2-3+ edema GI: abdomen soft, normal BS, no masses, no tenderness, no hepatomegaly, no splenomegaly EXTREMITIES:  Able to move X 4 extremities PSYCHIATRIC: Alert and oriented X 3. Affect and behavior are appropriate   LABS/RADIOLOGY: Labs reviewed: Basic Metabolic Panel:  Recent Labs  08/14/16 1757  10/19/16 1648  10/20/16 1255 10/21/16 1761  10/23/16 0625 10/24/16 0536 10/26/16 0623 10/30/16 11/13/16 11/21/16  NA  --   < > 140  < >  --  140  < > 140 140 141 143 144  144 144  K  --   < > 3.4*  < >  --  4.3  < > 3.8 3.9 4.0 3.5 3.9  3.9 3.6  CL  --   < > 109  < >  --  104  < > 101 101 96*  --   --   --   CO2  --   < > 22  < >  --  28  < > 29 29 32  --   --   --   GLUCOSE  --   < > 81  < >  --  107*  < > 98 92 94  --   --   --   BUN  --   < >  26*  < >  --  28*  < > 29* 31* 36* 49* 40*  40* 27*  CREATININE  --   < > 2.06*  < >  --  2.01*  < > 1.99* 1.87* 2.22* 2.3* 2.5*  2.5* 2.5*  CALCIUM  --   < > 7.8*  < >  --  8.5*  < > 8.7* 8.7* 9.1  --   --   --   MG 2.1  < > 1.6*  --  1.8 2.0  --   --   --   --   --   --   --   PHOS 3.3  --   --   --   --   --   --   --   --   --   --   --   --   < > = values in this interval not displayed. Liver Function Tests:  Recent Labs  10/11/16 1327 10/12/16 1133 10/15/16 0531 11/13/16  AST 24 23 21  12*  12*  ALT 26 23 17  6*  6*  ALKPHOS 56 53 61 90  90  BILITOT 1.2 2.0* 2.0*  --   PROT 5.4* 4.9* 5.1*  --   ALBUMIN 2.7* 2.5* 2.6*  --     Recent Labs  08/14/16 1303  AMMONIA  13   CBC:  Recent Labs  08/18/16 0354  10/11/16 1327  10/24/16 0536 10/25/16 0853 10/26/16 0623 10/30/16  WBC 5.4  < > 7.1  < > 4.3 4.3 5.1 4.7  NEUTROABS 3.7  --  5.6  --   --   --   --  3  HGB 11.1*  < > 8.3*  < > 8.5* 8.9* 8.3* 9.5*  HCT 34.9*  < > 26.3*  < > 27.5* 29.3* 27.2* 30*  MCV 95.1  < > 96.3  < > 94.8 93.3 93.2  --   PLT 143*  < > 138*  < > 155 150 157 157  < > = values in this interval not displayed. Lipid Panel:  Recent Labs  03/24/16 1012 10/07/16 0819  HDL 46.40 25*   Cardiac Enzymes:  Recent Labs  10/11/16 1327 10/11/16 1930 10/12/16 1133  TROPONINI 0.12* 0.16* 0.70*   CBG:  Recent Labs  08/18/16 1147 10/06/16 0802 10/07/16 1627  GLUCAP 101* 109* 106*     ASSESSMENT/PLAN:  1. Physical deconditioning - for Home health PT and OT, For therapeutic strengthening exercises; fall precautions   2. History of GI bleed - no signs/symptoms of GI bleed, continue pantoprazole 40 mg 1 tab by mouth twice a day and ranitidine 150 mg 1 tab by mouth twice a day   3. Chronic systolic CHF (congestive heart failure) (HCC) - stable; continue Coreg 12.5 mg 1 tab by mouth twice a day, torsemide 20 mg 1 tab by mouth twice a day, digoxin 62.5 g 1 tab by mouth daily and KCl ER 20 meq  1 tab PO Q MWF and  2 tabs = 40 meq PO Q TThSatSun   4. Chronic respiratory failure with hypoxia (HCC) - continue Pro Air HFA 90 g inhaler inhale 2 puffs into the lungs every 6 hours when necessary and levalbuterol 0.63 mg/3 mL inhale one vial via nebulizer every 6 hours when necessary and O2 @ 2L/min via [place] continuously   5. 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 Adjustment insomnia - continue trazodone 50 mg 1 tab by mouth daily at bedtime   7. Atrial fibrillation, chronic (HCC) - rate controlled; continue Coumadin, digoxin 62.5 mcg 1 tab by mouth daily and Coreg 12.5 mg 1  tab by mouth twice a day   8. Benign prostatic hyperplasia, unspecified whether lower urinary tract symptoms present - continue tamsulosin 0.4 mg 1 capsule by mouth daily   9. Hyperlipidemia, unspecified hyperlipidemia type - continue atorvastatin 40 mg 1 tab by mouth daily at bedtime Lab Results  Component Value Date   CHOL 98 10/07/2016   HDL 25 (L) 10/07/2016   LDLCALC 54 10/07/2016   TRIG 93 10/07/2016   CHOLHDL 3.9 10/07/2016   10. CAD - no chest pains; continue isosorbide dinitrate 10 mg 1 tab by mouth 3 times a day  11. Long-term use of anticoagulant - INR 2.1 , will have Coumadin 7.5 mg 1 tab by mouth every Saturday and Sunday and 5 mg 1 tab by mouth every Monday to Friday, INR on 12/04/16   12. CKD, stage 3 - check BMP in 2 weeks Lab Results  Component Value Date   CREATININE 2.5 (A) 11/21/2016       Goals of care:  Short-term rehabilitation   Monina C. Milnor - NP    [employer] [phone]

## 2016-12-02 ENCOUNTER — Emergency Department (HOSPITAL_COMMUNITY): Payer: Medicare HMO

## 2016-12-02 ENCOUNTER — Inpatient Hospital Stay (HOSPITAL_COMMUNITY)
Admission: EM | Admit: 2016-12-02 | Discharge: 2016-12-07 | DRG: 291 | Disposition: A | Discharge status: Skilled Nursing Facility | Payer: Medicare HMO | Attending: Internal Medicine | Admitting: Internal Medicine

## 2016-12-02 ENCOUNTER — Encounter (HOSPITAL_COMMUNITY): Payer: Self-pay | Admitting: Emergency Medicine

## 2016-12-02 DIAGNOSIS — I714 Abdominal aortic aneurysm, without rupture, unspecified: Secondary | ICD-10-CM | POA: Diagnosis present

## 2016-12-02 DIAGNOSIS — Z955 Presence of coronary angioplasty implant and graft: Secondary | ICD-10-CM

## 2016-12-02 DIAGNOSIS — I482 Chronic atrial fibrillation, unspecified: Secondary | ICD-10-CM | POA: Diagnosis present

## 2016-12-02 DIAGNOSIS — Z888 Allergy status to other drugs, medicaments and biological substances status: Secondary | ICD-10-CM | POA: Diagnosis not present

## 2016-12-02 DIAGNOSIS — R4182 Altered mental status, unspecified: Secondary | ICD-10-CM | POA: Diagnosis not present

## 2016-12-02 DIAGNOSIS — G4730 Sleep apnea, unspecified: Secondary | ICD-10-CM | POA: Diagnosis present

## 2016-12-02 DIAGNOSIS — F322 Major depressive disorder, single episode, severe without psychotic features: Secondary | ICD-10-CM | POA: Diagnosis not present

## 2016-12-02 DIAGNOSIS — Z9981 Dependence on supplemental oxygen: Secondary | ICD-10-CM | POA: Diagnosis not present

## 2016-12-02 DIAGNOSIS — I252 Old myocardial infarction: Secondary | ICD-10-CM

## 2016-12-02 DIAGNOSIS — I248 Other forms of acute ischemic heart disease: Secondary | ICD-10-CM | POA: Diagnosis not present

## 2016-12-02 DIAGNOSIS — F329 Major depressive disorder, single episode, unspecified: Secondary | ICD-10-CM | POA: Diagnosis present

## 2016-12-02 DIAGNOSIS — J441 Chronic obstructive pulmonary disease with (acute) exacerbation: Secondary | ICD-10-CM | POA: Diagnosis not present

## 2016-12-02 DIAGNOSIS — Z8249 Family history of ischemic heart disease and other diseases of the circulatory system: Secondary | ICD-10-CM

## 2016-12-02 DIAGNOSIS — I509 Heart failure, unspecified: Secondary | ICD-10-CM | POA: Diagnosis not present

## 2016-12-02 DIAGNOSIS — E785 Hyperlipidemia, unspecified: Secondary | ICD-10-CM | POA: Diagnosis present

## 2016-12-02 DIAGNOSIS — Z87891 Personal history of nicotine dependence: Secondary | ICD-10-CM | POA: Diagnosis not present

## 2016-12-02 DIAGNOSIS — I5043 Acute on chronic combined systolic (congestive) and diastolic (congestive) heart failure: Secondary | ICD-10-CM | POA: Diagnosis present

## 2016-12-02 DIAGNOSIS — Z8673 Personal history of transient ischemic attack (TIA), and cerebral infarction without residual deficits: Secondary | ICD-10-CM

## 2016-12-02 DIAGNOSIS — Z85528 Personal history of other malignant neoplasm of kidney: Secondary | ICD-10-CM

## 2016-12-02 DIAGNOSIS — K219 Gastro-esophageal reflux disease without esophagitis: Secondary | ICD-10-CM | POA: Diagnosis present

## 2016-12-02 DIAGNOSIS — I2510 Atherosclerotic heart disease of native coronary artery without angina pectoris: Secondary | ICD-10-CM | POA: Diagnosis present

## 2016-12-02 DIAGNOSIS — Z85828 Personal history of other malignant neoplasm of skin: Secondary | ICD-10-CM

## 2016-12-02 DIAGNOSIS — D649 Anemia, unspecified: Secondary | ICD-10-CM | POA: Diagnosis present

## 2016-12-02 DIAGNOSIS — R748 Abnormal levels of other serum enzymes: Secondary | ICD-10-CM | POA: Diagnosis not present

## 2016-12-02 DIAGNOSIS — R069 Unspecified abnormalities of breathing: Secondary | ICD-10-CM | POA: Diagnosis not present

## 2016-12-02 DIAGNOSIS — I480 Paroxysmal atrial fibrillation: Secondary | ICD-10-CM | POA: Diagnosis present

## 2016-12-02 DIAGNOSIS — F29 Unspecified psychosis not due to a substance or known physiological condition: Secondary | ICD-10-CM | POA: Diagnosis not present

## 2016-12-02 DIAGNOSIS — F0281 Dementia in other diseases classified elsewhere with behavioral disturbance: Secondary | ICD-10-CM | POA: Diagnosis not present

## 2016-12-02 DIAGNOSIS — I5040 Unspecified combined systolic (congestive) and diastolic (congestive) heart failure: Secondary | ICD-10-CM | POA: Diagnosis not present

## 2016-12-02 DIAGNOSIS — M6281 Muscle weakness (generalized): Secondary | ICD-10-CM | POA: Diagnosis not present

## 2016-12-02 DIAGNOSIS — M19042 Primary osteoarthritis, left hand: Secondary | ICD-10-CM | POA: Diagnosis present

## 2016-12-02 DIAGNOSIS — R2689 Other abnormalities of gait and mobility: Secondary | ICD-10-CM | POA: Diagnosis not present

## 2016-12-02 DIAGNOSIS — I5021 Acute systolic (congestive) heart failure: Secondary | ICD-10-CM | POA: Diagnosis not present

## 2016-12-02 DIAGNOSIS — E440 Moderate protein-calorie malnutrition: Secondary | ICD-10-CM | POA: Diagnosis not present

## 2016-12-02 DIAGNOSIS — M1711 Unilateral primary osteoarthritis, right knee: Secondary | ICD-10-CM | POA: Diagnosis present

## 2016-12-02 DIAGNOSIS — I5023 Acute on chronic systolic (congestive) heart failure: Secondary | ICD-10-CM | POA: Diagnosis not present

## 2016-12-02 DIAGNOSIS — I10 Essential (primary) hypertension: Secondary | ICD-10-CM | POA: Diagnosis not present

## 2016-12-02 DIAGNOSIS — I130 Hypertensive heart and chronic kidney disease with heart failure and stage 1 through stage 4 chronic kidney disease, or unspecified chronic kidney disease: Secondary | ICD-10-CM | POA: Diagnosis not present

## 2016-12-02 DIAGNOSIS — G309 Alzheimer's disease, unspecified: Secondary | ICD-10-CM | POA: Diagnosis not present

## 2016-12-02 DIAGNOSIS — I350 Nonrheumatic aortic (valve) stenosis: Secondary | ICD-10-CM | POA: Diagnosis not present

## 2016-12-02 DIAGNOSIS — N184 Chronic kidney disease, stage 4 (severe): Secondary | ICD-10-CM | POA: Diagnosis not present

## 2016-12-02 DIAGNOSIS — R7989 Other specified abnormal findings of blood chemistry: Secondary | ICD-10-CM

## 2016-12-02 DIAGNOSIS — F32A Depression, unspecified: Secondary | ICD-10-CM | POA: Diagnosis present

## 2016-12-02 DIAGNOSIS — I447 Left bundle-branch block, unspecified: Secondary | ICD-10-CM | POA: Diagnosis present

## 2016-12-02 DIAGNOSIS — I110 Hypertensive heart disease with heart failure: Secondary | ICD-10-CM | POA: Diagnosis not present

## 2016-12-02 DIAGNOSIS — F0391 Unspecified dementia with behavioral disturbance: Secondary | ICD-10-CM | POA: Diagnosis not present

## 2016-12-02 DIAGNOSIS — R778 Other specified abnormalities of plasma proteins: Secondary | ICD-10-CM | POA: Diagnosis present

## 2016-12-02 DIAGNOSIS — Z781 Physical restraint status: Secondary | ICD-10-CM

## 2016-12-02 DIAGNOSIS — J449 Chronic obstructive pulmonary disease, unspecified: Secondary | ICD-10-CM | POA: Diagnosis present

## 2016-12-02 DIAGNOSIS — E039 Hypothyroidism, unspecified: Secondary | ICD-10-CM | POA: Diagnosis present

## 2016-12-02 DIAGNOSIS — Z7901 Long term (current) use of anticoagulants: Secondary | ICD-10-CM

## 2016-12-02 DIAGNOSIS — I5041 Acute combined systolic (congestive) and diastolic (congestive) heart failure: Secondary | ICD-10-CM | POA: Diagnosis not present

## 2016-12-02 DIAGNOSIS — G308 Other Alzheimer's disease: Secondary | ICD-10-CM | POA: Diagnosis not present

## 2016-12-02 DIAGNOSIS — Z79899 Other long term (current) drug therapy: Secondary | ICD-10-CM

## 2016-12-02 DIAGNOSIS — F321 Major depressive disorder, single episode, moderate: Secondary | ICD-10-CM | POA: Diagnosis not present

## 2016-12-02 DIAGNOSIS — Z905 Acquired absence of kidney: Secondary | ICD-10-CM

## 2016-12-02 DIAGNOSIS — R278 Other lack of coordination: Secondary | ICD-10-CM | POA: Diagnosis not present

## 2016-12-02 DIAGNOSIS — Z6826 Body mass index (BMI) 26.0-26.9, adult: Secondary | ICD-10-CM

## 2016-12-02 DIAGNOSIS — J9621 Acute and chronic respiratory failure with hypoxia: Secondary | ICD-10-CM | POA: Diagnosis not present

## 2016-12-02 DIAGNOSIS — R0602 Shortness of breath: Secondary | ICD-10-CM | POA: Diagnosis not present

## 2016-12-02 LAB — COMPREHENSIVE METABOLIC PANEL
ALBUMIN: 3.4 g/dL — AB (ref 3.5–5.0)
ALK PHOS: 87 U/L (ref 38–126)
ALT: 8 U/L — AB (ref 17–63)
AST: 22 U/L (ref 15–41)
Anion gap: 11 (ref 5–15)
BUN: 30 mg/dL — ABNORMAL HIGH (ref 6–20)
CALCIUM: 8.7 mg/dL — AB (ref 8.9–10.3)
CO2: 26 mmol/L (ref 22–32)
CREATININE: 2.37 mg/dL — AB (ref 0.61–1.24)
Chloride: 106 mmol/L (ref 101–111)
GFR calc Af Amer: 30 mL/min — ABNORMAL LOW (ref >60)
GFR calc non Af Amer: 26 mL/min — ABNORMAL LOW (ref >60)
GLUCOSE: 100 mg/dL — AB (ref 65–99)
Potassium: 3.5 mmol/L (ref 3.5–5.1)
SODIUM: 143 mmol/L (ref 135–145)
Total Bilirubin: 1.4 mg/dL — ABNORMAL HIGH (ref 0.3–1.2)
Total Protein: 6.6 g/dL (ref 6.5–8.1)

## 2016-12-02 LAB — MRSA PCR SCREENING: MRSA BY PCR: NEGATIVE

## 2016-12-02 LAB — URINALYSIS, ROUTINE W REFLEX MICROSCOPIC
Bilirubin Urine: NEGATIVE
GLUCOSE, UA: NEGATIVE mg/dL
Hgb urine dipstick: NEGATIVE
Ketones, ur: NEGATIVE mg/dL
LEUKOCYTES UA: NEGATIVE
Nitrite: NEGATIVE
PH: 7 (ref 5.0–8.0)
PROTEIN: NEGATIVE mg/dL
SPECIFIC GRAVITY, URINE: 1.006 (ref 1.005–1.030)

## 2016-12-02 LAB — CBC
HCT: 28.2 % — ABNORMAL LOW (ref 39.0–52.0)
HEMOGLOBIN: 8.8 g/dL — AB (ref 13.0–17.0)
MCH: 27 pg (ref 26.0–34.0)
MCHC: 31.2 g/dL (ref 30.0–36.0)
MCV: 86.5 fL (ref 78.0–100.0)
Platelets: 196 10*3/uL (ref 150–400)
RBC: 3.26 MIL/uL — AB (ref 4.22–5.81)
RDW: 18.9 % — ABNORMAL HIGH (ref 11.5–15.5)
WBC: 4.4 10*3/uL (ref 4.0–10.5)

## 2016-12-02 LAB — I-STAT TROPONIN, ED: Troponin i, poc: 0.1 ng/mL (ref 0.00–0.08)

## 2016-12-02 LAB — BRAIN NATRIURETIC PEPTIDE: B Natriuretic Peptide: 2238.5 pg/mL — ABNORMAL HIGH (ref 0.0–100.0)

## 2016-12-02 LAB — I-STAT CG4 LACTIC ACID, ED: Lactic Acid, Venous: 0.99 mmol/L (ref 0.5–1.9)

## 2016-12-02 LAB — PROTIME-INR
INR: 1.54
PROTHROMBIN TIME: 18.7 s — AB (ref 11.4–15.2)

## 2016-12-02 LAB — TROPONIN I: Troponin I: 0.08 ng/mL (ref <0.03)

## 2016-12-02 LAB — DIGOXIN LEVEL: DIGOXIN LVL: 0.4 ng/mL — AB (ref 0.8–2.0)

## 2016-12-02 MED ORDER — HYDROCODONE-ACETAMINOPHEN 5-325 MG PO TABS
1.0000 | ORAL_TABLET | ORAL | Status: DC | PRN
  Administered 2016-12-03 – 2016-12-04 (×2): 2 via ORAL
  Administered 2016-12-06 – 2016-12-07 (×3): 1 via ORAL
  Filled 2016-12-02: qty 2
  Filled 2016-12-02 (×2): qty 1
  Filled 2016-12-02: qty 2
  Filled 2016-12-02: qty 1

## 2016-12-02 MED ORDER — LEVALBUTEROL HCL 0.63 MG/3ML IN NEBU
0.6300 mg | INHALATION_SOLUTION | Freq: Four times a day (QID) | RESPIRATORY_TRACT | Status: DC | PRN
  Administered 2016-12-03 (×2): 0.63 mg via RESPIRATORY_TRACT
  Filled 2016-12-02 (×2): qty 3

## 2016-12-02 MED ORDER — ONDANSETRON HCL 4 MG PO TABS: 4.0000 mg | ORAL_TABLET | Freq: Four times a day (QID) | ORAL | Status: DC | PRN

## 2016-12-02 MED ORDER — SERTRALINE HCL 100 MG PO TABS
100.0000 mg | ORAL_TABLET | Freq: Every evening | ORAL | Status: DC
  Administered 2016-12-02 – 2016-12-06 (×5): 100 mg via ORAL
  Filled 2016-12-02 (×5): qty 1

## 2016-12-02 MED ORDER — POTASSIUM CHLORIDE CRYS ER 20 MEQ PO TBCR
20.0000 meq | EXTENDED_RELEASE_TABLET | ORAL | Status: DC
  Administered 2016-12-04: 20 meq via ORAL
  Filled 2016-12-02: qty 1

## 2016-12-02 MED ORDER — FUROSEMIDE 10 MG/ML IJ SOLN
40.0000 mg | Freq: Two times a day (BID) | INTRAMUSCULAR | Status: DC
  Administered 2016-12-02 – 2016-12-06 (×9): 40 mg via INTRAVENOUS
  Filled 2016-12-02 (×9): qty 4

## 2016-12-02 MED ORDER — HALOPERIDOL LACTATE 5 MG/ML IJ SOLN
5.0000 mg | Freq: Once | INTRAMUSCULAR | Status: AC
  Administered 2016-12-02: 5 mg via INTRAMUSCULAR
  Filled 2016-12-02: qty 1

## 2016-12-02 MED ORDER — ONDANSETRON HCL 4 MG/2ML IJ SOLN: 4.0000 mg | Freq: Four times a day (QID) | INTRAMUSCULAR | Status: DC | PRN

## 2016-12-02 MED ORDER — GUAIFENESIN ER 600 MG PO TB12: 600.0000 mg | ORAL_TABLET | Freq: Two times a day (BID) | ORAL | Status: DC

## 2016-12-02 MED ORDER — PANTOPRAZOLE SODIUM 40 MG PO TBEC
40.0000 mg | DELAYED_RELEASE_TABLET | Freq: Two times a day (BID) | ORAL | Status: DC
  Administered 2016-12-02 – 2016-12-07 (×10): 40 mg via ORAL
  Filled 2016-12-02 (×10): qty 1

## 2016-12-02 MED ORDER — WARFARIN - PHARMACIST DOSING INPATIENT
Freq: Every day | Status: DC
  Administered 2016-12-03: 18:00:00

## 2016-12-02 MED ORDER — SODIUM CHLORIDE 0.9% FLUSH
3.0000 mL | INTRAVENOUS | Status: DC | PRN
  Filled 2016-12-02: qty 3

## 2016-12-02 MED ORDER — ACETAMINOPHEN 650 MG RE SUPP: 650.0000 mg | Freq: Four times a day (QID) | RECTAL | Status: DC | PRN

## 2016-12-02 MED ORDER — FAMOTIDINE 20 MG PO TABS
10.0000 mg | ORAL_TABLET | Freq: Two times a day (BID) | ORAL | Status: DC
  Administered 2016-12-02 – 2016-12-07 (×10): 10 mg via ORAL
  Filled 2016-12-02 (×10): qty 1

## 2016-12-02 MED ORDER — DIGOXIN 0.0625 MG HALF TABLET
0.0625 mg | ORAL_TABLET | Freq: Every morning | ORAL | Status: DC
  Administered 2016-12-03 – 2016-12-07 (×5): 0.0625 mg via ORAL
  Filled 2016-12-02 (×5): qty 1

## 2016-12-02 MED ORDER — ORAL CARE MOUTH RINSE
15.0000 mL | Freq: Two times a day (BID) | OROMUCOSAL | Status: DC
  Administered 2016-12-03 – 2016-12-06 (×4): 15 mL via OROMUCOSAL

## 2016-12-02 MED ORDER — POLYETHYLENE GLYCOL 3350 17 G PO PACK: 17.0000 g | PACK | Freq: Every day | ORAL | Status: DC | PRN

## 2016-12-02 MED ORDER — WARFARIN SODIUM 5 MG PO TABS
7.5000 mg | ORAL_TABLET | ORAL | Status: AC
  Administered 2016-12-02: 7.5 mg via ORAL
  Filled 2016-12-02: qty 1

## 2016-12-02 MED ORDER — TAMSULOSIN HCL 0.4 MG PO CAPS
0.4000 mg | ORAL_CAPSULE | Freq: Every day | ORAL | Status: DC
  Administered 2016-12-03 – 2016-12-07 (×5): 0.4 mg via ORAL
  Filled 2016-12-02 (×5): qty 1

## 2016-12-02 MED ORDER — FUROSEMIDE 10 MG/ML IJ SOLN
40.0000 mg | Freq: Once | INTRAMUSCULAR | Status: AC
  Administered 2016-12-02: 40 mg via INTRAVENOUS
  Filled 2016-12-02: qty 4

## 2016-12-02 MED ORDER — POTASSIUM CHLORIDE CRYS ER 20 MEQ PO TBCR
40.0000 meq | EXTENDED_RELEASE_TABLET | ORAL | Status: DC
  Administered 2016-12-03: 40 meq via ORAL
  Filled 2016-12-02: qty 2

## 2016-12-02 MED ORDER — SODIUM CHLORIDE 0.9% FLUSH
3.0000 mL | Freq: Two times a day (BID) | INTRAVENOUS | Status: DC
  Administered 2016-12-02 – 2016-12-06 (×7): 3 mL via INTRAVENOUS

## 2016-12-02 MED ORDER — ACETAMINOPHEN 325 MG PO TABS: 650.0000 mg | ORAL_TABLET | Freq: Four times a day (QID) | ORAL | Status: DC | PRN

## 2016-12-02 MED ORDER — CHLORHEXIDINE GLUCONATE 0.12 % MT SOLN
15.0000 mL | Freq: Two times a day (BID) | OROMUCOSAL | Status: DC
  Administered 2016-12-02 – 2016-12-07 (×9): 15 mL via OROMUCOSAL
  Filled 2016-12-02 (×10): qty 15

## 2016-12-02 MED ORDER — SODIUM CHLORIDE 0.9 % IV SOLN: 250.0000 mL | INTRAVENOUS | Status: DC | PRN

## 2016-12-02 MED ORDER — ISOSORBIDE DINITRATE 10 MG PO TABS
10.0000 mg | ORAL_TABLET | Freq: Three times a day (TID) | ORAL | Status: DC
  Administered 2016-12-02 – 2016-12-07 (×14): 10 mg via ORAL
  Filled 2016-12-02 (×16): qty 1

## 2016-12-02 MED ORDER — ATORVASTATIN CALCIUM 40 MG PO TABS
40.0000 mg | ORAL_TABLET | Freq: Every day | ORAL | Status: DC
  Administered 2016-12-02 – 2016-12-06 (×5): 40 mg via ORAL
  Filled 2016-12-02 (×5): qty 1

## 2016-12-02 MED ORDER — TRAZODONE HCL 50 MG PO TABS
50.0000 mg | ORAL_TABLET | Freq: Every day | ORAL | Status: DC
  Administered 2016-12-02 – 2016-12-06 (×5): 50 mg via ORAL
  Filled 2016-12-02 (×5): qty 1

## 2016-12-02 MED ORDER — POTASSIUM CHLORIDE CRYS ER 20 MEQ PO TBCR: 20.0000 meq | EXTENDED_RELEASE_TABLET | Freq: Every day | ORAL | Status: DC

## 2016-12-02 MED ORDER — QUETIAPINE FUMARATE 25 MG PO TABS
50.0000 mg | ORAL_TABLET | Freq: Two times a day (BID) | ORAL | Status: DC
  Administered 2016-12-02 – 2016-12-04 (×4): 50 mg via ORAL
  Filled 2016-12-02 (×4): qty 2

## 2016-12-02 MED ORDER — TRAZODONE HCL 50 MG PO TABS
25.0000 mg | ORAL_TABLET | Freq: Two times a day (BID) | ORAL | Status: DC | PRN
  Administered 2016-12-03 – 2016-12-06 (×4): 25 mg via ORAL
  Filled 2016-12-02 (×6): qty 1

## 2016-12-02 MED ORDER — CARVEDILOL 12.5 MG PO TABS
12.5000 mg | ORAL_TABLET | Freq: Two times a day (BID) | ORAL | Status: DC
  Administered 2016-12-03 – 2016-12-04 (×3): 12.5 mg via ORAL
  Filled 2016-12-02 (×3): qty 1

## 2016-12-02 MED ORDER — ENSURE ENLIVE PO LIQD: 237.0000 mL | ORAL | Status: DC

## 2016-12-02 NOTE — ED Triage Notes (Signed)
Per [facility] pt from [name], pt ran out of door trying to escape. Pt became short of breath without his oxygen. Staff reports pt was agitated. Pt was pleasant with EMS. Pt usually on 2L [place]

## 2016-12-02 NOTE — ED Provider Notes (Addendum)
[facility] DEPT Provider Note   CSN: [id number] Arrival date & time: 12/02/16  1551     History   Chief Complaint Chief Complaint  Patient presents with  . Shortness of Breath    HPI Mark [name] is a 76 y.o. male.  HPI 76 year old male with past medical history as below including dementia with paranoia, coronary artery disease, chronic atrial fibrillation, systolic heart failure, who presents with shortness of breath. According to report from EMS, the patient started becoming agitated with staff. He has a history of the same. He then took off his oxygen and began running out of the facility. EMS was called and they were ultimately able to get him to stop running. At that time, hand or shortness of breath because he was off his oxygen. He initially declined transport but was able to be convinced by EMS. Currently, the patient states that he is frustrated at his current living situation and feels like he is being persecuted. He denies any shortness of breath. He does state that his legs are slightly more swollen than usual but denies any rashes or open wounds.  Past Medical History:  Diagnosis Date  . AAA (abdominal aortic aneurysm) (HCC)    9.1 cm repair 05/2009 ( ENDOVASCULAR REPAIR ).  MORE RECENT RE-EVALUATION OF ENLARGING AAA BY DR. Trula Slade AND BY DR. Kathlene Cote  MAY 2015 - PT STATES HE UNDERSTANDS THAT NOTHING IS LEAKING AT PRESENT TIME AND HE IS TO FOLLOW UP WITH HIS DOCTORS FOR FUTURE MONITORING I  . Arthritis    LEFT HAND, RT KNEE (08/14/2016)  . Basal cell carcinoma    "right side of my nose; think they burned it off" (08/14/2016)  . CAD (coronary artery disease)    a. s/p PCI to LAD/diagonal, subtotal of PDA 2006. b. Negative nuc 2014.  Marland Kitchen Chronic atrial fibrillation ([place])   . Chronic systolic CHF (congestive heart failure) (HCC)    a. EF 20-25% dx in 08/2016; etiology not yet clear.  . CKD (chronic kidney disease), stage III    a. stage III-IV, Cr baseline appears 1.8-2.0    . COPD (chronic obstructive pulmonary disease) ([place])   . Depression   . GERD (gastroesophageal reflux disease)   . History of kidney stones   . History of pneumothorax    LONG TIME AGO  . Hyperlipidemia   . Hypertension   . Migraine    "very rare the last 20 years" (08/14/2016)  . Myocardial infarction ([place]) 2006  . Osteoarthritis   . Renal cell carcinoma ([place])   . Shingles outbreak    PT NOTICED SKIN RASH RT GROIN AND RIGHT TRUNK ON MONDAY 10/27/13 - NOT A LOT OF DISCOMFORT- NOT ON ANY ORAL MEDS TO TREAT.  . Sleep apnea    "dx'd; didn't try mask" (08/14/2016)  . Stroke [facility])    "CT showed I've had old strokes; never knew about it before today" (08/14/2016)    Patient Active Problem List   Diagnosis Date Noted  . Pulmonary edema   . GI bleed 10/11/2016  . Subacute delirium 10/07/2016  . Cerebrovascular accident (CVA) ([place])   . AKI (acute kidney injury) ([place]) 10/06/2016  . Alzheimer's dementia with behavioral disturbance   . Hallucinations   . LBBB (left bundle branch block)   . Acute systolic CHF (congestive heart failure) ([place]) 08/16/2016  . Acute encephalopathy 08/14/2016  . Acute on chronic respiratory failure with hypoxia ([place]) 08/14/2016  . Asthma 03/24/2016  . Preventative health care 09/22/2015  .  Osteopenia 09/07/2014  . Osteoarthritis, hand 04/09/2014  . Normocytic anemia 04/09/2014  . OSA (obstructive sleep apnea) 01/08/2014  . Daytime somnolence 11/20/2013  . Renal cell carcinoma ([place]) 11/06/2013  . AAA (abdominal aortic aneurysm) without rupture ([place]) 09/22/2013  . Atrial fibrillation, chronic ([place]) 07/25/2012  . 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 BPH (benign prostatic hyperplasia) 12/21/2009  . AAA (abdominal aortic aneurysm) ([place]) 05/28/2009  . Disorder resulting from impaired renal function 05/28/2009  . Hyperlipidemia 11/11/2008  . Depression 11/11/2008  . Essential hypertension 11/11/2008  . Coronary atherosclerosis 11/11/2008  .  GERD 11/11/2008  . SKIN CANCER, HX OF 11/11/2008  . NEPHROLITHIASIS, HX OF 11/11/2008    Past Surgical History:  Procedure Laterality Date  . ABDOMINAL AORTAGRAM N/A 09/24/2013   Procedure: ABDOMINAL Maxcine Ham;  Surgeon: Serafina Mitchell, MD;  Location: [facility] CATH LAB;  Service: Cardiovascular;  Laterality: N/A;  . ABDOMINAL AORTIC ANEURYSM REPAIR  JAN 2011   ENDOVASCULAR - DR. BRABHAM  . CARDIAC CATHETERIZATION  07/04/2004   Archie Endo 07/04/2004  . COLONOSCOPY  latest 2004   Dr Sammuel Cooper.  reported in 11/11/2008 PMD note as normal   . CORONARY ANGIOPLASTY WITH STENT PLACEMENT  07/05/2004   notes 07/05/2004  . ESOPHAGOGASTRODUODENOSCOPY N/A 10/12/2016   Procedure: ESOPHAGOGASTRODUODENOSCOPY (EGD);  Surgeon: Doran Stabler, MD;  Location: [facility] ENDOSCOPY;  Service: Endoscopy;  Laterality: N/A;  bedside procedure in [facility]  . KNEE ARTHROSCOPY Right   . LITHOTRIPSY    . ROBOT ASSISTED LAPAROSCOPIC NEPHRECTOMY Left 11/06/2013   Procedure: ROBOTIC ASSISTED LAPAROSCOPIC RADICAL  NEPHRECTOMY;  Surgeon: Alexis Frock, MD;  Location: [facility] ORS;  Service: Urology;  Laterality: Left;  . TONSILLECTOMY  1964       Home Medications    Prior to Admission medications   Medication Sig Start Date End Date Taking? Authorizing Provider  albuterol (PROVENTIL HFA;VENTOLIN HFA) 108 (90 Base) MCG/ACT inhaler Inhale 2 puffs into the lungs every 6 (six) hours as needed for wheezing or shortness of breath. 01/26/16   Shelda Pal, DO  atorvastatin (LIPITOR) 40 MG tablet TAKE 1 TABLET ONE TIME DAILY 04/06/16   [name], NP  carvedilol (COREG) 12.5 MG tablet Take 12.5 mg by mouth 2 (two) times daily.    [provider]  Coenzyme Q10 200 MG capsule Take 200 mg by mouth daily.    [provider]  digoxin 62.5 MCG TABS Please take 0.125 mg oral daily for 1 days, then change to 0.0625 mg oral daily on 6/23. 10/26/16   Elgergawy, Silver Huguenin, MD  feeding supplement, ENSURE ENLIVE, (ENSURE  ENLIVE) LIQD Take 237 mLs by mouth daily. 08/22/16   Lavina Hamman, MD  guaiFENesin (MUCINEX) 600 MG 12 hr tablet Take 600 mg by mouth 2 (two) times daily.  11/13/16   [provider]  isosorbide dinitrate (ISORDIL) 10 MG tablet Take 1 tablet (10 mg total) by mouth 3 (three) times daily. 10/26/16   Elgergawy, Silver Huguenin, MD  levalbuterol Penne Lash) 0.63 MG/3ML nebulizer solution Take 3 mLs (0.63 mg total) by nebulization every 6 (six) hours as needed for wheezing or shortness of breath. 10/26/16   Elgergawy, Silver Huguenin, MD  Multiple Vitamins-Minerals (MACUVITE EYE CARE PO) Take 1 capsule by mouth daily. 05/21/15   [provider]  Omega-3 Fatty Acids (FISH OIL) 1200 MG CAPS Take 1,200 mg by mouth daily.     [provider]  OXYGEN Inhale 3 L/min into the lungs continuous.  [provider]  pantoprazole (PROTONIX) 40 MG tablet Take 1 tablet (40 mg total) by mouth 2 (two) times daily. 10/26/16   Elgergawy, Silver Huguenin, MD  polyethylene glycol (MIRALAX / GLYCOLAX) packet Take 17 g by mouth daily as needed. 10/26/16   Elgergawy, Silver Huguenin, MD  potassium chloride SA (K-DUR,KLOR-CON) 20 MEQ tablet Take 20-40 mEq by mouth. Take 1 tablet to = 20 mEq M-W-F, take 2 tablets to = 40 mEq Tue-Thu-Sat-Sun    [provider]  QUEtiapine (SEROQUEL) 50 MG tablet Take 50 mg by mouth 2 (two) times daily.    [provider]  ranitidine (ZANTAC) 150 MG tablet Take 1 tablet (150 mg total) by mouth 2 (two) times daily. 04/06/16   [name], NP  sertraline (ZOLOFT) 100 MG tablet Take 1 tablet (100 mg total) by mouth every evening. 04/06/16   [name], NP  tamsulosin (FLOMAX) 0.4 MG CAPS capsule TAKE 1 CAPSULE (0.4 MG TOTAL) DAILY AFTER BREAKFAST. 09/22/16   [name], NP  torsemide (DEMADEX) 20 MG tablet Take 20 mg by mouth 2 (two) times daily.    [provider]  traZODone (DESYREL) 50 MG tablet Take 25-50 mg by mouth. Take 1/2 tablet to = 25 mg  bid prn, 1 tablet to = 50 mg qhs    [provider]  Warfarin Sodium (COUMADIN PO) Take 5-7.5 mg by mouth daily. Take 5 mg Sun-Mon-Tues-Wed, take 7.5 mg Thur-Fri-Sat for atrial fibrillation    [provider]    Family History Family History  Problem Relation Age of Onset  . Pulmonary embolism Mother        died of PE  . Heart disease Mother        before age 91  . Anuerysm Father        history of popliteal  . Coronary artery disease Unknown   . Hyperlipidemia Unknown   . Hypertension Unknown   . Prostate cancer Unknown   . Other Neg Hx        polycystic kidney disease, and colon cancer  . Colon cancer Neg Hx     Social History Social History  Substance Use Topics  . Smoking status: Former Smoker    Packs/day: 1.50    Years: 23.00    Types: Cigarettes    Quit date: 08/24/1980  . Smokeless tobacco: Never Used  . Alcohol use 1.5 oz/week    3 Standard drinks or equivalent per week     Comment: 08/14/2016 "maybe 2-3 drinks/month average"     Allergies   Ace inhibitors   Review of Systems Review of Systems  Constitutional: Negative for chills, fatigue and fever.  HENT: Negative for congestion and rhinorrhea.   Eyes: Negative for visual disturbance.  Respiratory: Positive for shortness of breath (Transient, while running). Negative for cough and wheezing.   Cardiovascular: Negative for chest pain and leg swelling.  Gastrointestinal: Negative for abdominal pain, diarrhea, nausea and vomiting.  Genitourinary: Negative for dysuria and flank pain.  Musculoskeletal: Negative for neck pain and neck stiffness.  Skin: Negative for rash and wound.  Allergic/Immunologic: Negative for immunocompromised state.  Neurological: Negative for syncope, weakness and headaches.  All other systems reviewed and are negative.    Physical Exam Updated Vital Signs BP (!) 155/113   Pulse 84   Temp 97.7 F (36.5 C) (Oral)   Resp 16   SpO2 92%   Physical Exam    Constitutional: He is oriented to person, place, and time. He  appears well-developed and well-nourished. No distress.  HENT:  Head: Normocephalic and atraumatic.  Eyes: Conjunctivae are normal.  Neck: Neck supple. JVD present.  Cardiovascular: Normal rate, regular rhythm and normal heart sounds.  Exam reveals no friction rub.   No murmur heard. Pulmonary/Chest: Effort normal. No tachypnea. No respiratory distress. He has no wheezes. He has rales (Bibasilar).  Abdominal: He exhibits no distension.  Musculoskeletal: He exhibits edema (2+ pitting edema bilateral lower extremities).  Neurological: He is alert and oriented to person, place, and time. He exhibits normal muscle tone.  Skin: Skin is warm. Capillary refill takes less than 2 seconds.  Psychiatric: He has a normal mood and affect.  Nursing note and vitals reviewed.    ED Treatments / Results  Labs (all labs ordered are listed, but only abnormal results are displayed) Labs Reviewed  CBC - Abnormal; Notable for the following:       Result Value   RBC 3.26 (*)    Hemoglobin 8.8 (*)    HCT 28.2 (*)    RDW 18.9 (*)    All other components within normal limits  COMPREHENSIVE METABOLIC PANEL - Abnormal; Notable for the following:    Glucose, Bld 100 (*)    BUN 30 (*)    Creatinine, Ser 2.37 (*)    Calcium 8.7 (*)    Albumin 3.4 (*)    ALT 8 (*)    Total Bilirubin 1.4 (*)    GFR calc non Af Amer 26 (*)    GFR calc Af Amer 30 (*)    All other components within normal limits  BRAIN NATRIURETIC PEPTIDE - Abnormal; Notable for the following:    B Natriuretic Peptide 2,238.5 (*)    All other components within normal limits  I-STAT TROPONIN, ED - Abnormal; Notable for the following:    Troponin i, poc 0.10 (*)    All other components within normal limits  I-STAT CG4 LACTIC ACID, ED    EKG  EKG Interpretation  Date/Time:  Saturday December 02 2016 16:34:18 EDT Ventricular Rate:  87 PR Interval:    QRS Duration: 173 QT  Interval:  422 QTC Calculation: 508 R Axis:   -98 Text Interpretation:  Atrial fibrillation Nonspecific IVCD with LAD Inferior infarct, old Probable anterolateral infarct, acute Baseline wander in lead(s) V2 similar to prior EKGs Confirmed by Malvin Johns [phone]) on 12/02/2016 4:51:47 PM       Radiology Dg Chest 2 View  Result Date: 12/02/2016 CLINICAL DATA:  Shortness of breath.  History of CHF. EXAM: CHEST  2 VIEW COMPARISON:  Chest radiograph 10/17/2016 FINDINGS: Monitoring leads overlie the patient. Stable cardiomegaly. Pulmonary vascular redistribution and mild interstitial opacities bilaterally. Small bilateral pleural effusions. No pneumothorax. Thoracic spine degenerative changes. Suspect skin fold projecting over the lower right hemithorax. IMPRESSION: Cardiomegaly, pulmonary vascular redistribution and mild interstitial edema. Small bilateral pleural effusions. Electronically Signed   By: Lovey Newcomer M.D.   On: 12/02/2016 16:54    Procedures Procedures (including critical care time)  Medications Ordered in ED Medications  furosemide (LASIX) injection 40 mg (not administered)     Initial Impression / Assessment and Plan / ED Course  I have reviewed the triage vital signs and the nursing notes.  Pertinent labs & imaging results that were available during my care of the patient were reviewed by me and considered in my medical decision making (see chart for details).     76 year old male with past medical history as above here with worsening  shortness of breath and leg swelling, worsened in the setting of trying to run away from his nursing facility. On arrival, patient is hypertensive and borderline hypoxic. He isn't overtly fluid overloaded on exam and has elevated troponin, elevated BNP, and chest x-ray consistent with fluid overload. I suspect this is secondary to demand and EKG shows no acute coronary syndrome. Will give Lasix and plan for admission for diuresis of acute CHF  exacerbation.  This note was prepared with assistance of Systems analyst. Occasional wrong-word or sound-a-like substitutions may have occurred due to the inherent limitations of voice recognition software.   Final Clinical Impressions(s) / ED Diagnoses   Final diagnoses:  Acute on chronic systolic congestive heart failure ([place])     Duffy Bruce, MD 12/02/16 1756  ADDENDUM: While awaiting bed, patient became increasingly agitated. He is demanding to leave. He is threatening towards myself and staff. When asked where he is, he is unable to answer. He states that he drove here, while in reality he was taken here by EMS from his nursing facility. Per review of records, patient has a history of agitated psychosis and paranoia with his dementia. I feel he is very high risk to himself and he has no understanding of his medical condition. IVC placed and will give Haldol for agitation and patient safety.   Duffy Bruce, MD 12/02/16 [phone]

## 2016-12-02 NOTE — ED Notes (Signed)
Patient transported to X-ray 

## 2016-12-02 NOTE — ED Notes (Signed)
Bed: WA15 Expected date:  Expected time:  Means of arrival:  Comments: 

## 2016-12-02 NOTE — H&P (Addendum)
Mark [name] PXT:[id number] DOB: 1941/03/31 DOA: 12/02/2016     PCP: [name], NP   Outpatient Specialists: Vascular  Sur[facility] Patient coming from:  From facility [facility]  Chief Complaint: Shortness of breath  HPI: Mark [name] is a 76 y.o. 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 Attempted to run away from SNF without his oxygen on. Patient developed worsening Shortness of breath that has been worse than baseline. Patient has recurrent episodes of agitation. Denies any chest pain does endorse somewhat was seen from the breath. Patient continues sustained paranoid stating that government in the event that his diagnosis of heart failure sedation get money off of him.  Regarding pertinent Chronic problems: History of AAA followed by vascular surgery. Known history of systolic CHF last echogram in April 2018 showed EF 20-25 percent Last admission was in and of June 2018 for confusion and slurred speech. Negative MRI was noted to have A. fib with RVR at that time and guaiac positive stools with melena as well as decompensated CHF EGDs showed multiple duodenal ulcers H. pylori screen was negative he was on PPI twice daily required free units packed red blood cells transfusion. His Coumadin was restarted at discharge Regarding his CHF. Required IV diuresis but improved metolazone diuresis 8.1 L during hospital stay and was discharged home on Lasix 40 mg twice a day if creatinine at 2.2 patient not a candidate for ace inhibitors given renal failure. After discharge his  Lasix was discontinued and changed to torsemide secondary to CTD She has known history of COPD and remains at 3 L oxygen at baseline IN ER:  Temp (24hrs), Avg:97.7 F (36.5 C), Min:97.7 F (36.5 C), Max:97.7 F (36.5 C)      on arrival  ED Triage Vitals [12/02/16 1605]  Enc Vitals Group     BP (!) 136/94     Pulse Rate 85     Resp 18     Temp 97.7 F (36.5 C)     Temp Source Oral     SpO2 100 %     Weight      Height      Head Circumference      Peak Flow      Pain Score      Pain Loc      Pain Edu?      Excl. in [place]?    RR 18 HR 97 Bp 159/109 LA 0.99 Trop 0.10  WBC 4.4 Hg 8.8 Na 143 K 3.5 BUN 30 Cr 2.37  BNP up from 864 in April to 2238 today Wt 208lb CXR - mild Pulmonary edema Following Medications were ordered in ER: Medications  furosemide (LASIX) injection 40 mg (40 mg Intravenous Given 12/02/16 1803)       Hospitalist was called for admission for acute on chronic systolic CHF exacerbation  Review of Systems:    Pertinent positives include: confusion shortness of breath at rest  dyspnea on exertion, change in mood or affect. Paranoia  Constitutional:  No weight loss, night sweats, Fevers, chills, fatigue, weight loss  HEENT:  No headaches, Difficulty swallowing,Tooth/dental problems,Sore throat,  No sneezing,  itching, ear ache, nasal congestion, post nasal drip,  Cardio-vascular:  No chest pain, Orthopnea, PND, anasarca, dizziness, palpitations.no Bilateral lower extremity swelling  GI:  No heartburn, indigestion, abdominal pain, nausea, vomiting, diarrhea, change in bowel habits, loss of appetite, melena, blood in stool, hematemesis Resp:  noNo excess mucus, no productive cough, No non-productive cough, No coughing up of blood.No change in color of mucus. No wheezing. Skin:  no rash or lesions. No jaundice GU:  no dysuria, change in color of urine, no urgency or frequency. No straining to urinate.  No flank pain.  Musculoskeletal:  No joint pain or  no joint swelling. No decreased range of motion. No back pain.  Psych:  No  No depression or anxiety. No memory loss.  Neuro: no localizing neurological complaints, no tingling, no weakness, no double vision, no gait abnormality, no slurred speech, no confusion  As per HPI otherwise 10 point review of systems negative.   Past Medical History: Past Medical History:  Diagnosis Date  . AAA (abdominal aortic aneurysm) (HCC)    9.1 cm repair 05/2009 ( ENDOVASCULAR REPAIR ).  MORE RECENT RE-EVALUATION OF ENLARGING AAA BY DR. Trula Slade AND BY DR. Kathlene Cote  MAY 2015 - PT STATES HE UNDERSTANDS THAT NOTHING IS LEAKING AT PRESENT TIME AND HE IS TO FOLLOW UP WITH HIS DOCTORS FOR FUTURE MONITORING I  . Arthritis    LEFT HAND, RT KNEE (08/14/2016)  . Basal cell carcinoma    "right side of my nose; think they burned it off" (08/14/2016)  . CAD (coronary artery disease)    a. s/p PCI to LAD/diagonal, subtotal of PDA 2006. b. Negative nuc 2014.  Marland Kitchen Chronic atrial fibrillation ([place])   . Chronic systolic CHF (congestive heart failure) (HCC)    a. EF 20-25% dx in 08/2016; etiology not yet clear.  . CKD (chronic kidney disease), stage III    a. stage III-IV, Cr baseline appears 1.8-2.0  . COPD (chronic obstructive pulmonary disease) ([place])   . Depression   . GERD (gastroesophageal reflux disease)   . History of kidney stones   . History of pneumothorax    LONG TIME AGO  . Hyperlipidemia   . Hypertension   . Migraine    "very rare the last 20 years" (08/14/2016)  . Myocardial infarction ([place]) 2006  . Osteoarthritis   . Renal cell carcinoma ([place])   . Shingles outbreak    PT NOTICED SKIN RASH RT GROIN AND RIGHT TRUNK ON MONDAY 10/27/13 - NOT A LOT OF DISCOMFORT- NOT ON ANY ORAL MEDS TO TREAT.  . Sleep apnea    "dx'd; didn't try mask" (08/14/2016)  . Stroke [facility])    "CT showed I've had old strokes; never knew about it before today" (08/14/2016)   Past Surgical History:  Procedure Laterality Date  . ABDOMINAL  AORTAGRAM N/A 09/24/2013   Procedure: ABDOMINAL Maxcine Ham;  Surgeon: Serafina Mitchell, MD;  Location: [facility] CATH LAB;  Service: Cardiovascular;  Laterality: N/A;  . ABDOMINAL AORTIC ANEURYSM REPAIR  JAN 2011   ENDOVASCULAR - DR. BRABHAM  . CARDIAC CATHETERIZATION  07/04/2004   Archie Endo 07/04/2004  . COLONOSCOPY  latest 2004   Dr Sammuel Cooper.  reported in 11/11/2008 PMD note as normal   . CORONARY ANGIOPLASTY WITH STENT PLACEMENT  07/05/2004   notes 07/05/2004  . ESOPHAGOGASTRODUODENOSCOPY N/A 10/12/2016   Procedure: ESOPHAGOGASTRODUODENOSCOPY (EGD);  Surgeon: Doran Stabler, MD;  Location: [facility] ENDOSCOPY;  Service: Endoscopy;  Laterality: N/A;  bedside procedure in [facility] [place]  .  KNEE ARTHROSCOPY Right   . LITHOTRIPSY    . ROBOT ASSISTED LAPAROSCOPIC NEPHRECTOMY Left 11/06/2013   Procedure: ROBOTIC ASSISTED LAPAROSCOPIC RADICAL  NEPHRECTOMY;  Surgeon: Alexis Frock, MD;  Location: [facility] ORS;  Service: Urology;  Laterality: Left;  . TONSILLECTOMY  1964     Social History:  Ambulatory  independently     reports that he quit smoking about 36 years ago. His smoking use included Cigarettes. He has a 34.50 pack-year smoking history. He has never used smokeless tobacco. He reports that he drinks about 1.5 oz of alcohol per week . He reports that he does not use drugs.  Allergies:   Allergies  Allergen Reactions  . Ace Inhibitors Cough       Family History:   Family History  Problem Relation Age of Onset  . Pulmonary embolism Mother        died of PE  . Heart disease Mother        before age 58  . Anuerysm Father        history of popliteal  . Coronary artery disease Unknown   . Hyperlipidemia Unknown   . Hypertension Unknown   . Prostate cancer Unknown   . Other Neg Hx        polycystic kidney disease, and colon cancer  . Colon cancer Neg Hx     Medications: Prior to Admission medications   Medication Sig Start Date End Date Taking? Authorizing Provider  albuterol (PROVENTIL  HFA;VENTOLIN HFA) 108 (90 Base) MCG/ACT inhaler Inhale 2 puffs into the lungs every 6 (six) hours as needed for wheezing or shortness of breath. 01/26/16   Shelda Pal, DO  atorvastatin (LIPITOR) 40 MG tablet TAKE 1 TABLET ONE TIME DAILY 04/06/16   [name], NP  carvedilol (COREG) 12.5 MG tablet Take 12.5 mg by mouth 2 (two) times daily.    [provider]  Coenzyme Q10 200 MG capsule Take 200 mg by mouth daily.    [provider]  digoxin 62.5 MCG TABS Please take 0.125 mg oral daily for 1 days, then change to 0.0625 mg oral daily on 6/23. 10/26/16   Elgergawy, Silver Huguenin, MD  feeding supplement, ENSURE ENLIVE, (ENSURE ENLIVE) LIQD Take 237 mLs by mouth daily. 08/22/16   Lavina Hamman, MD  guaiFENesin (MUCINEX) 600 MG 12 hr tablet Take 600 mg by mouth 2 (two) times daily.  11/13/16   [provider]  isosorbide dinitrate (ISORDIL) 10 MG tablet Take 1 tablet (10 mg total) by mouth 3 (three) times daily. 10/26/16   Elgergawy, Silver Huguenin, MD  levalbuterol Penne Lash) 0.63 MG/3ML nebulizer solution Take 3 mLs (0.63 mg total) by nebulization every 6 (six) hours as needed for wheezing or shortness of breath. 10/26/16   Elgergawy, Silver Huguenin, MD  Multiple Vitamins-Minerals (MACUVITE EYE CARE PO) Take 1 capsule by mouth daily. 05/21/15   [provider]  Omega-3 Fatty Acids (FISH OIL) 1200 MG CAPS Take 1,200 mg by mouth daily.     [provider]  OXYGEN Inhale 3 L/min into the lungs continuous.    [provider]  pantoprazole (PROTONIX) 40 MG tablet Take 1 tablet (40 mg total) by mouth 2 (two) times daily. 10/26/16   Elgergawy, Silver Huguenin, MD  polyethylene glycol (MIRALAX / GLYCOLAX) packet Take 17 g by mouth daily as needed. 10/26/16   Elgergawy, Silver Huguenin, MD  potassium chloride SA (K-DUR,KLOR-CON) 20 MEQ tablet Take 20-40 mEq by mouth. Take 1 tablet to = 20  mEq M-W-F, take 2 tablets to = 40 mEq Tue-Thu-Sat-Sun    [provider]    QUEtiapine (SEROQUEL) 50 MG tablet Take 50 mg by mouth 2 (two) times daily.    [provider]  ranitidine (ZANTAC) 150 MG tablet Take 1 tablet (150 mg total) by mouth 2 (two) times daily. 04/06/16   [name], NP  sertraline (ZOLOFT) 100 MG tablet Take 1 tablet (100 mg total) by mouth every evening. 04/06/16   [name], NP  tamsulosin (FLOMAX) 0.4 MG CAPS capsule TAKE 1 CAPSULE (0.4 MG TOTAL) DAILY AFTER BREAKFAST. 09/22/16   [name], NP  torsemide (DEMADEX) 20 MG tablet Take 20 mg by mouth 2 (two) times daily.    [provider]  traZODone (DESYREL) 50 MG tablet Take 25-50 mg by mouth. Take 1/2 tablet to = 25 mg bid prn, 1 tablet to = 50 mg qhs    [provider]  Warfarin Sodium (COUMADIN PO) Take 5-7.5 mg by mouth daily. Take 5 mg Sun-Mon-Tues-Wed, take 7.5 mg Thur-Fri-Sat for atrial fibrillation    [provider]    Physical Exam: Patient Vitals for the past 24 hrs:  BP Temp Temp src Pulse Resp SpO2  12/02/16 1800 (!) 159/109 - - 97 18 91 %  12/02/16 1730 (!) 155/113 - - 84 16 92 %  12/02/16 1700 (!) 157/100 - - 89 (!) 26 91 %  12/02/16 1630 (!) 145/94 - - 95 (!) 21 96 %  12/02/16 1605 (!) 136/94 97.7 F (36.5 C) Oral 85 18 100 %    1. General:  in No Acute distress 2. Psychological: Alert and   Oriented to self but not situation 3. Head/ENT:   Moist  Mucous Membranes                          Head Non traumatic, neck supple                           Poor Dentition 4. SKIN: normal  Skin turgor,  Skin clean Dry and intact no rash 5. Heart: Regular rate and rhythm no  Murmur, Rub or gallop 6. Lungs: no wheezes some crackles   7. Abdomen: Soft,  non-tender, Non distended 8. Lower extremities: no clubbing, cyanosis, bilateral edema 9. Neurologically Grossly intact, moving all 4 extremities equally   10. MSK: Normal range of motion   body mass index is unknown because there is no height or weight on file.  Labs  on Admission:   Labs on Admission: I have personally reviewed following labs and imaging studies  CBC:  Recent Labs Lab 12/02/16 1651  WBC 4.4  HGB 8.8*  HCT 28.2*  MCV 86.5  PLT 500   Basic Metabolic Panel:  Recent Labs Lab 12/02/16 1651  NA 143  K 3.5  CL 106  CO2 26  GLUCOSE 100*  BUN 30*  CREATININE 2.37*  CALCIUM 8.7*   GFR: Estimated Creatinine Clearance: 32.5 mL/min (A) (by C-G formula based on SCr of 2.37 mg/dL (H)). Liver Function Tests:  Recent Labs Lab 12/02/16 1651  AST 22  ALT 8*  ALKPHOS 87  BILITOT 1.4*  PROT 6.6  ALBUMIN 3.4*   No results for input(s): LIPASE, AMYLASE in the last 168 hours. No results for input(s): AMMONIA in the last 168 hours. Coagulation Profile: No results for input(s): INR, PROTIME in the last 168 hours. Cardiac Enzymes:  No results for input(s): CKTOTAL, CKMB, CKMBINDEX, TROPONINI in the last 168 hours. BNP (last 3 results) No results for input(s): PROBNP in the last 8760 hours. HbA1C: No results for input(s): HGBA1C in the last 72 hours. CBG: No results for input(s): GLUCAP in the last 168 hours. Lipid Profile: No results for input(s): CHOL, HDL, LDLCALC, TRIG, CHOLHDL, LDLDIRECT in the last 72 hours. Thyroid Function Tests: No results for input(s): TSH, T4TOTAL, FREET4, T3FREE, THYROIDAB in the last 72 hours. Anemia Panel: No results for input(s): VITAMINB12, FOLATE, FERRITIN, TIBC, IRON, RETICCTPCT in the last 72 hours. Urine analysis:  Sepsis Labs: @LABRCNTIP (procalcitonin:4,lacticidven:4) )No results found for this or any previous visit (from the past 240 hour(s)).    UA  ordered  Lab Results  Component Value Date   HGBA1C 5.8 (H) 10/07/2016    Estimated Creatinine Clearance: 32.5 mL/min (A) (by C-G formula based on SCr of 2.37 mg/dL (H)).  BNP (last 3 results) No results for input(s): PROBNP in the last 8760 hours.   ECG REPORT  Independently reviewed Rate:87   Rhythm: A.fib wLBBB ST&T  Change: LBBB has hx of the same QTC 508  There were no vitals filed for this visit.   Cultures: No results found for: SDES, [place], CULT, REPTSTATUS   Radiological Exams on Admission: Dg Chest 2 View  Result Date: 12/02/2016 CLINICAL DATA:  Shortness of breath.  History of CHF. EXAM: CHEST  2 VIEW COMPARISON:  Chest radiograph 10/17/2016 FINDINGS: Monitoring leads overlie the patient. Stable cardiomegaly. Pulmonary vascular redistribution and mild interstitial opacities bilaterally. Small bilateral pleural effusions. No pneumothorax. Thoracic spine degenerative changes. Suspect skin fold projecting over the lower right hemithorax. IMPRESSION: Cardiomegaly, pulmonary vascular redistribution and mild interstitial edema. Small bilateral pleural effusions. Electronically Signed   By: Lovey Newcomer M.D.   On: 12/02/2016 16:54    Chart has been reviewed    Assessment/Plan  76 y.o. male with medical history significant of Alzheimer's dementia, HLD, HTN, CAD, AAA, atrial fibrillation on chronic Coumadin, asthma, chronic respiratory failure with hypoxia, systolic CHF, history of GI bleeding in June 2018, CKD baseline creatinine 2.0, COPD history of renal cell carcinoma status post nephrectomy    Admitted for acute respiratory failure in a setting of acute on chronic systolic CHF exacerbation and elevated troponin  Present on Admission: . Acute systolic CHF (congestive heart failure) ([place]) - - admit on telemetry, cycle cardiac enzymes, obtain serial ECG, to evaluate for ischemia as a cause of heart failure  monitor daily weight  diurese with IV lasix and monitor orthostatics and creatinine to avoid over diuresis.  Order echogram to evaluate EF and valves   patient is not on ACE/ARBi due to CKD  cardiology consulted  . Elevated troponin likely in the setting of demand ischemia and no chest pain no ECG changes appreciate cardiology input . Hyperlipidemia stable continue home medication .  Essential hypertension stable continue home medications . Depression - we are for agitation and paranoia appreciate behavioral health consult continue home medications for now deferred to psychiatry to the chest. Patient is IVC'd sitter for safety at bedside ordered   .Chronic Atrial fibrillation (HCC)        - CHA2DS2 vas score 5: continue current anticoagulation with  Coumadin per pharmacy,             -  Rate control:  Currently controlled with coreg and digoxin       . Alzheimer's dementia with behavioral disturbance - appreciate behavioral health input given  paranoia patient showing no aggressive behaviors but is a flight risk  . AAA (abdominal aortic aneurysm) ([place]) and endorsing any symptoms stable continue to follow up with vascular surgery     Other plan as per orders.  DVT prophylaxis:  Coumadin    Code Status:  FULL CODE as per  Prior reccords  Family Communication:   Family not  at  Bedside    Disposition Plan:     Back to current facility when stable                                                                  Social Work     consulted                          Consults called: Behavioral health consulted, Email cardiology   Admission status:   Inpatient    Level of care     tele        I have spent a total of 67 min on this admission   extra time was spent to discuss case with consultants  Stanly Si 12/02/2016, 10:09 PM    [facility]  Pager [phone]   after 2 AM please page floor coverage PA If 7AM-7PM, please contact the day team taking care of the patient  [email or web address]  Password [email or web address]

## 2016-12-02 NOTE — Progress Notes (Signed)
ANTICOAGULATION CONSULT NOTE - Initial Consult  Pharmacy Consult for coumadin Indication: atrial fibrillation  Allergies  Allergen Reactions  . Ace Inhibitors Cough    Patient Measurements: Height: 6' (182.9 cm) Weight: 205 lb 0.4 oz (93 kg) IBW/kg (Calculated) : 77.6   Vital Signs: Temp: 97.7 F (36.5 C) (07/28 2204) Temp Source: Axillary (07/28 2204) BP: 143/108 (07/28 2204) Pulse Rate: 96 (07/28 2204)  Labs:  Recent Labs  12/02/16 1651 12/02/16 2100  HGB 8.8*  --   HCT 28.2*  --   PLT 196  --   CREATININE 2.37*  --   TROPONINI  --  0.08*    Estimated Creatinine Clearance: 30.5 mL/min (A) (by C-G formula based on SCr of 2.37 mg/dL (H)).   Medical History: Past Medical History:  Diagnosis Date  . AAA (abdominal aortic aneurysm) (HCC)    9.1 cm repair 05/2009 ( ENDOVASCULAR REPAIR ).  MORE RECENT RE-EVALUATION OF ENLARGING AAA BY DR. Trula Slade AND BY DR. Kathlene Cote  MAY 2015 - PT STATES HE UNDERSTANDS THAT NOTHING IS LEAKING AT PRESENT TIME AND HE IS TO FOLLOW UP WITH HIS DOCTORS FOR FUTURE MONITORING I  . Arthritis    LEFT HAND, RT KNEE (08/14/2016)  . Basal cell carcinoma    "right side of my nose; think they burned it off" (08/14/2016)  . CAD (coronary artery disease)    a. s/p PCI to LAD/diagonal, subtotal of PDA 2006. b. Negative nuc 2014.  Marland Kitchen Chronic atrial fibrillation ([place])   . Chronic systolic CHF (congestive heart failure) (HCC)    a. EF 20-25% dx in 08/2016; etiology not yet clear.  . CKD (chronic kidney disease), stage III    a. stage III-IV, Cr baseline appears 1.8-2.0  . COPD (chronic obstructive pulmonary disease) ([place])   . Depression   . GERD (gastroesophageal reflux disease)   . History of kidney stones   . History of pneumothorax    LONG TIME AGO  . Hyperlipidemia   . Hypertension   . Migraine    "very rare the last 20 years" (08/14/2016)  . Myocardial infarction ([place]) 2006  . Osteoarthritis   . Renal cell carcinoma ([place])   . Shingles outbreak     PT NOTICED SKIN RASH RT GROIN AND RIGHT TRUNK ON MONDAY 10/27/13 - NOT A LOT OF DISCOMFORT- NOT ON ANY ORAL MEDS TO TREAT.  . Sleep apnea    "dx'd; didn't try mask" (08/14/2016)  . Stroke [facility])    "CT showed I've had old strokes; never knew about it before today" (08/14/2016)    Medications:  Prescriptions Prior to Admission  Medication Sig Dispense Refill Last Dose  . albuterol (PROVENTIL HFA;VENTOLIN HFA) 108 (90 Base) MCG/ACT inhaler Inhale 2 puffs into the lungs every 6 (six) hours as needed for wheezing or shortness of breath. 1 Inhaler 0 Past Month at Unknown time  . atorvastatin (LIPITOR) 40 MG tablet TAKE 1 TABLET ONE TIME DAILY (Patient taking differently: Take 40 mg by mouth daily at 6 PM. TAKE 1 TABLET ONE TIME DAILY) 90 tablet 1 12/01/2016 at Unknown time  . carvedilol (COREG) 12.5 MG tablet Take 12.5 mg by mouth 2 (two) times daily.   12/02/2016 at 1700  . Coenzyme Q10 200 MG capsule Take 200 mg by mouth daily.   12/02/2016 at Unknown time  . digoxin 62.5 MCG TABS Please take 0.125 mg oral daily for 1 days, then change to 0.0625 mg oral daily on 6/23. (Patient taking differently: Take 0.0625 mg by  mouth daily. Please take 0.125 mg oral daily for 1 days, then change to 0.0625 mg oral daily on 6/23.)   12/02/2016 at Unknown time  . isosorbide dinitrate (ISORDIL) 10 MG tablet Take 1 tablet (10 mg total) by mouth 3 (three) times daily.   12/02/2016 at 1500  . levalbuterol (XOPENEX) 0.63 MG/3ML nebulizer solution Take 3 mLs (0.63 mg total) by nebulization every 6 (six) hours as needed for wheezing or shortness of breath. 3 mL 12 Past Month at Unknown time  . Multiple Vitamins-Minerals (MACUVITE EYE CARE PO) Take 1 capsule by mouth daily.   12/02/2016 at Unknown time  . Omega-3 Fatty Acids (FISH OIL) 1200 MG CAPS Take 1,200 mg by mouth daily.    12/02/2016 at Unknown time  . OXYGEN Inhale 3 L/min into the lungs continuous.   12/02/2016 at Unknown time  . pantoprazole (PROTONIX) 40 MG tablet Take 1  tablet (40 mg total) by mouth 2 (two) times daily.   12/02/2016 at Unknown time  . polyethylene glycol (MIRALAX / GLYCOLAX) packet Take 17 g by mouth daily as needed. 14 each 0 Past Month at Unknown time  . potassium chloride SA (K-DUR,KLOR-CON) 20 MEQ tablet Take 20-40 mEq by mouth as directed. Take 1 tablet to = 20 mEq M-W-F, take 2 tablets to = 40 mEq Tue-Thu-Sat-Sun    12/02/2016 at Unknown time  . QUEtiapine (SEROQUEL) 50 MG tablet Take 50 mg by mouth 2 (two) times daily.   12/02/2016 at Unknown time  . ranitidine (ZANTAC) 150 MG tablet Take 1 tablet (150 mg total) by mouth 2 (two) times daily. 180 tablet 1 12/02/2016 at Unknown time  . sertraline (ZOLOFT) 100 MG tablet Take 1 tablet (100 mg total) by mouth every evening. 90 tablet 1 12/01/2016 at Unknown time  . tamsulosin (FLOMAX) 0.4 MG CAPS capsule TAKE 1 CAPSULE (0.4 MG TOTAL) DAILY AFTER BREAKFAST. (Patient taking differently: Take 0.4 mg by mouth daily after breakfast. TAKE 1 CAPSULE (0.4 MG TOTAL) DAILY AFTER BREAKFAST.) 90 capsule 1 12/02/2016 at Unknown time  . torsemide (DEMADEX) 20 MG tablet Take 20 mg by mouth 2 (two) times daily.   12/02/2016 at Unknown time  . traZODone (DESYREL) 50 MG tablet Take 25-50 mg by mouth as directed. Take 50mg  every night at bedtime. May take 25mg  up to twice daily as needed for anxiety   Past Week at Unknown time  . warfarin (COUMADIN) 5 MG tablet Take 5-7.5 mg by mouth as directed. Take 5mg  Monday-Friday. Take 7.5mg  Saturday and Sunday.   12/01/2016 at Unknown time    Assessment:  76 yo M from SNF on coumadin for Afib.  Per Med history tech:  Coumadin at SNF was transcribed incorrectly and pt was not getting it as intended. Pt was supposed to be on warfarin 5 mg Mon-Fr and 7.5 mg Sat/Sun but transcription error had pt on 5 mg Mon- Fri and nothing Sat/Sun. No dose given today (Saturday).  SNF not sure how long error in place.    INR 1.54 is subtherapeutic.  Goal of Therapy:  INR 2-3   Plan:  Coumadin 7.5  mg po x 1 dose now Daily INR Eudelia Bunch, Pharm.D. [phone] 12/02/2016 11:09 PM

## 2016-12-03 DIAGNOSIS — I482 Chronic atrial fibrillation: Secondary | ICD-10-CM

## 2016-12-03 DIAGNOSIS — I5021 Acute systolic (congestive) heart failure: Secondary | ICD-10-CM

## 2016-12-03 DIAGNOSIS — R748 Abnormal levels of other serum enzymes: Secondary | ICD-10-CM

## 2016-12-03 LAB — BASIC METABOLIC PANEL
ANION GAP: 9 (ref 5–15)
BUN: 29 mg/dL — AB (ref 6–20)
CHLORIDE: 108 mmol/L (ref 101–111)
CO2: 26 mmol/L (ref 22–32)
Calcium: 8.6 mg/dL — ABNORMAL LOW (ref 8.9–10.3)
Creatinine, Ser: 2.19 mg/dL — ABNORMAL HIGH (ref 0.61–1.24)
GFR calc Af Amer: 33 mL/min — ABNORMAL LOW (ref >60)
GFR, EST NON AFRICAN AMERICAN: 28 mL/min — AB (ref >60)
GLUCOSE: 102 mg/dL — AB (ref 65–99)
POTASSIUM: 3.4 mmol/L — AB (ref 3.5–5.1)
Sodium: 143 mmol/L (ref 135–145)

## 2016-12-03 LAB — TROPONIN I
TROPONIN I: 0.07 ng/mL — AB (ref <0.03)
TROPONIN I: 0.21 ng/mL — AB (ref <0.03)
Troponin I: 0.09 ng/mL (ref <0.03)
Troponin I: 0.14 ng/mL (ref <0.03)
Troponin I: 0.23 ng/mL (ref <0.03)

## 2016-12-03 LAB — COMPREHENSIVE METABOLIC PANEL
ALK PHOS: 79 U/L (ref 38–126)
ALT: 10 U/L — AB (ref 17–63)
AST: 24 U/L (ref 15–41)
Albumin: 3.2 g/dL — ABNORMAL LOW (ref 3.5–5.0)
Anion gap: 11 (ref 5–15)
BUN: 31 mg/dL — AB (ref 6–20)
CHLORIDE: 107 mmol/L (ref 101–111)
CO2: 25 mmol/L (ref 22–32)
CREATININE: 2.25 mg/dL — AB (ref 0.61–1.24)
Calcium: 8.7 mg/dL — ABNORMAL LOW (ref 8.9–10.3)
GFR calc Af Amer: 32 mL/min — ABNORMAL LOW (ref >60)
GFR calc non Af Amer: 27 mL/min — ABNORMAL LOW (ref >60)
GLUCOSE: 134 mg/dL — AB (ref 65–99)
Potassium: 3.5 mmol/L (ref 3.5–5.1)
Sodium: 143 mmol/L (ref 135–145)
Total Bilirubin: 1.7 mg/dL — ABNORMAL HIGH (ref 0.3–1.2)
Total Protein: 6.2 g/dL — ABNORMAL LOW (ref 6.5–8.1)

## 2016-12-03 LAB — PROTIME-INR
INR: 1.61
Prothrombin Time: 19.3 seconds — ABNORMAL HIGH (ref 11.4–15.2)

## 2016-12-03 LAB — PHOSPHORUS: Phosphorus: 4 mg/dL (ref 2.5–4.6)

## 2016-12-03 LAB — CBC
HCT: 27.5 % — ABNORMAL LOW (ref 39.0–52.0)
Hemoglobin: 8.5 g/dL — ABNORMAL LOW (ref 13.0–17.0)
MCH: 26.7 pg (ref 26.0–34.0)
MCHC: 30.9 g/dL (ref 30.0–36.0)
MCV: 86.5 fL (ref 78.0–100.0)
PLATELETS: 193 10*3/uL (ref 150–400)
RBC: 3.18 MIL/uL — ABNORMAL LOW (ref 4.22–5.81)
RDW: 18.8 % — AB (ref 11.5–15.5)
WBC: 4.9 10*3/uL (ref 4.0–10.5)

## 2016-12-03 LAB — TSH: TSH: 5.016 u[IU]/mL — ABNORMAL HIGH (ref 0.350–4.500)

## 2016-12-03 LAB — MAGNESIUM: Magnesium: 2.1 mg/dL (ref 1.7–2.4)

## 2016-12-03 MED ORDER — WARFARIN SODIUM 5 MG PO TABS
7.5000 mg | ORAL_TABLET | Freq: Once | ORAL | Status: AC
  Administered 2016-12-03: 7.5 mg via ORAL
  Filled 2016-12-03: qty 1

## 2016-12-03 MED ORDER — LORAZEPAM 2 MG/ML IJ SOLN
1.0000 mg | Freq: Once | INTRAMUSCULAR | Status: AC
  Administered 2016-12-03: 1 mg via INTRAVENOUS
  Filled 2016-12-03: qty 1

## 2016-12-03 MED ORDER — LABETALOL HCL 5 MG/ML IV SOLN
5.0000 mg | INTRAVENOUS | Status: DC | PRN
  Administered 2016-12-03 – 2016-12-06 (×4): 5 mg via INTRAVENOUS
  Filled 2016-12-03 (×2): qty 4

## 2016-12-03 MED ORDER — HALOPERIDOL LACTATE 5 MG/ML IJ SOLN: 2.0000 mg | Freq: Four times a day (QID) | INTRAMUSCULAR | Status: DC | PRN

## 2016-12-03 NOTE — Progress Notes (Signed)
[place] for coumadin Indication: atrial fibrillation  Allergies  Allergen Reactions  . Ace Inhibitors Cough    Patient Measurements: Height: 6' (182.9 cm) Weight: 204 lb 12.9 oz (92.9 kg) IBW/kg (Calculated) : 77.6   Vital Signs: Temp: 98.7 F (37.1 C) (07/29 0614) Temp Source: Axillary (07/29 0614) BP: 151/104 (07/29 0614) Pulse Rate: 62 (07/29 0614)  Labs:  Recent Labs  12/02/16 1651 12/02/16 2100 12/03/16 0127 12/03/16 0713  HGB 8.8*  --  8.5*  --   HCT 28.2*  --  27.5*  --   PLT 196  --  193  --   LABPROT 18.7*  --  19.3*  --   INR 1.54  --  1.61  --   CREATININE 2.37*  --  2.25*  --   TROPONINI  --  0.08* 0.07* 0.09*    Estimated Creatinine Clearance: 32.1 mL/min (A) (by C-G formula based on SCr of 2.25 mg/dL (H)).   Medical History: Past Medical History:  Diagnosis Date  . AAA (abdominal aortic aneurysm) (HCC)    9.1 cm repair 05/2009 ( ENDOVASCULAR REPAIR ).  MORE RECENT RE-EVALUATION OF ENLARGING AAA BY DR. Trula Slade AND BY DR. Kathlene Cote  MAY 2015 - PT STATES HE UNDERSTANDS THAT NOTHING IS LEAKING AT PRESENT TIME AND HE IS TO FOLLOW UP WITH HIS DOCTORS FOR FUTURE MONITORING I  . Arthritis    LEFT HAND, RT KNEE (08/14/2016)  . Basal cell carcinoma    "right side of my nose; think they burned it off" (08/14/2016)  . CAD (coronary artery disease)    a. s/p PCI to LAD/diagonal, subtotal of PDA 2006. b. Negative nuc 2014.  Marland Kitchen Chronic atrial fibrillation ([place])   . Chronic systolic CHF (congestive heart failure) (HCC)    a. EF 20-25% dx in 08/2016; etiology not yet clear.  . CKD (chronic kidney disease), stage III    a. stage III-IV, Cr baseline appears 1.8-2.0  . COPD (chronic obstructive pulmonary disease) ([place])   . Depression   . GERD (gastroesophageal reflux disease)   . History of kidney stones   . History of pneumothorax    LONG TIME AGO  . Hyperlipidemia   . Hypertension   . Migraine    "very rare the last 20  years" (08/14/2016)  . Myocardial infarction ([place]) 2006  . Osteoarthritis   . Renal cell carcinoma ([place])   . Shingles outbreak    PT NOTICED SKIN RASH RT GROIN AND RIGHT TRUNK ON MONDAY 10/27/13 - NOT A LOT OF DISCOMFORT- NOT ON ANY ORAL MEDS TO TREAT.  . Sleep apnea    "dx'd; didn't try mask" (08/14/2016)  . Stroke [facility])    "CT showed I've had old strokes; never knew about it before today" (08/14/2016)    Medications:  Prescriptions Prior to Admission  Medication Sig Dispense Refill Last Dose  . albuterol (PROVENTIL HFA;VENTOLIN HFA) 108 (90 Base) MCG/ACT inhaler Inhale 2 puffs into the lungs every 6 (six) hours as needed for wheezing or shortness of breath. 1 Inhaler 0 Past Month at Unknown time  . atorvastatin (LIPITOR) 40 MG tablet TAKE 1 TABLET ONE TIME DAILY (Patient taking differently: Take 40 mg by mouth daily at 6 PM. TAKE 1 TABLET ONE TIME DAILY) 90 tablet 1 12/01/2016 at Unknown time  . carvedilol (COREG) 12.5 MG tablet Take 12.5 mg by mouth 2 (two) times daily.   12/02/2016 at 1700  . Coenzyme Q10 200 MG capsule Take 200 mg by  mouth daily.   12/02/2016 at Unknown time  . digoxin 62.5 MCG TABS Please take 0.125 mg oral daily for 1 days, then change to 0.0625 mg oral daily on 6/23. (Patient taking differently: Take 0.0625 mg by mouth daily. Please take 0.125 mg oral daily for 1 days, then change to 0.0625 mg oral daily on 6/23.)   12/02/2016 at Unknown time  . isosorbide dinitrate (ISORDIL) 10 MG tablet Take 1 tablet (10 mg total) by mouth 3 (three) times daily.   12/02/2016 at 1500  . levalbuterol (XOPENEX) 0.63 MG/3ML nebulizer solution Take 3 mLs (0.63 mg total) by nebulization every 6 (six) hours as needed for wheezing or shortness of breath. 3 mL 12 Past Month at Unknown time  . Multiple Vitamins-Minerals (MACUVITE EYE CARE PO) Take 1 capsule by mouth daily.   12/02/2016 at Unknown time  . Omega-3 Fatty Acids (FISH OIL) 1200 MG CAPS Take 1,200 mg by mouth daily.    12/02/2016 at Unknown time   . OXYGEN Inhale 3 L/min into the lungs continuous.   12/02/2016 at Unknown time  . pantoprazole (PROTONIX) 40 MG tablet Take 1 tablet (40 mg total) by mouth 2 (two) times daily.   12/02/2016 at Unknown time  . polyethylene glycol (MIRALAX / GLYCOLAX) packet Take 17 g by mouth daily as needed. 14 each 0 Past Month at Unknown time  . potassium chloride SA (K-DUR,KLOR-CON) 20 MEQ tablet Take 20-40 mEq by mouth as directed. Take 1 tablet to = 20 mEq M-W-F, take 2 tablets to = 40 mEq Tue-Thu-Sat-Sun    12/02/2016 at Unknown time  . QUEtiapine (SEROQUEL) 50 MG tablet Take 50 mg by mouth 2 (two) times daily.   12/02/2016 at Unknown time  . ranitidine (ZANTAC) 150 MG tablet Take 1 tablet (150 mg total) by mouth 2 (two) times daily. 180 tablet 1 12/02/2016 at Unknown time  . sertraline (ZOLOFT) 100 MG tablet Take 1 tablet (100 mg total) by mouth every evening. 90 tablet 1 12/01/2016 at Unknown time  . tamsulosin (FLOMAX) 0.4 MG CAPS capsule TAKE 1 CAPSULE (0.4 MG TOTAL) DAILY AFTER BREAKFAST. (Patient taking differently: Take 0.4 mg by mouth daily after breakfast. TAKE 1 CAPSULE (0.4 MG TOTAL) DAILY AFTER BREAKFAST.) 90 capsule 1 12/02/2016 at Unknown time  . torsemide (DEMADEX) 20 MG tablet Take 20 mg by mouth 2 (two) times daily.   12/02/2016 at Unknown time  . traZODone (DESYREL) 50 MG tablet Take 25-50 mg by mouth as directed. Take 50mg  every night at bedtime. May take 25mg  up to twice daily as needed for anxiety   Past Week at Unknown time  . warfarin (COUMADIN) 5 MG tablet Take 5-7.5 mg by mouth as directed. Take 5mg  Monday-Friday. Take 7.5mg  Saturday and Sunday.   12/01/2016 at Unknown time    Assessment: 76 yo M from SNF on coumadin for Afib. INR 1.54 is subtherapeutic on admission.  Patient had not been receiving prescribed dose PTA which likely explains low level.    12/03/2016:  INR 1.61-->sub-therapeutic, but trending toward goal range  Hg low but stable, Pltc WNL  No bleeding noted  Na restricted  diet ordered  Goal of Therapy:  INR 2-3   Plan:  Coumadin 7.5 mg po x 1 today Daily INR  Netta Cedars, PharmD, BCPS Pager: [phone] 12/03/2016 8:57 AM

## 2016-12-03 NOTE — Progress Notes (Signed)
RT called to administer breathing tx. Patient was on 3 L (home setting) w/ O2 sats of 88%. Patient was increased to 4 L and given tx. Patient resting comfortably at this time and sitter at bedside. RT assessment complete.

## 2016-12-03 NOTE — Progress Notes (Signed)
Labs called with elevated troponin. MD updated. Pt denies pain or discomfort. MD with new orders for 2D echo

## 2016-12-03 NOTE — Progress Notes (Signed)
CRITICAL VALUE ALERT  Critical Value:  Troponin 0.14  Date & Time Notied:  12/03/16 1146  Provider Notified: 12/03/16 1146  Orders Received/Actions taken: 12/03/16 1230

## 2016-12-03 NOTE — Consult Note (Signed)
Cardiology Consultation:   Patient ID: Mark [name]; [id number]; 10/01/1940   Admit date: 12/02/2016 Date of Consult: 12/03/2016  Primary Care Provider: [name], NP Primary Cardiologist: Stanford Breed Primary Electrophysiologist:  None   Patient Profile:   Mark [name] is a 76 y.o. male with a hx of CAD, PAF HTN and elevated lipids who is being seen today for the evaluation of elevated troponin  at the request of Dr Vista Lawman.  History of Present Illness:   Mr. Mark [name] is a demented 76 y.o. with history of PCI LAD/D1 in 2006 and subtotal PDA. PVD with AAA repair in 2011 with residual endoleak. Echo 08/2016 with EF decreased 20-25%  Permanent afib on coumadin. Drop in EF not w/u further due to dementia and lack of chest pain. Dr Jacalyn Lefevre note indicated f/u myovue but never done. Admitted yesterday with increased agitation. Attempted to run away from SNF No chest pain some dyspnea Currently lethargic with sitter and no meaningful history Some delusions and psychosis as well  Troponin only elevated at .o9 BNP elevated at 2238.  Baseline Cr is elevated and now 2.37 INR subRx at 1.6 no focal neurologic signs   Past Medical History:  Diagnosis Date  . AAA (abdominal aortic aneurysm) (HCC)    9.1 cm repair 05/2009 ( ENDOVASCULAR REPAIR ).  MORE RECENT RE-EVALUATION OF ENLARGING AAA BY DR. Trula Slade AND BY DR. Kathlene Cote  MAY 2015 - PT STATES HE UNDERSTANDS THAT NOTHING IS LEAKING AT PRESENT TIME AND HE IS TO FOLLOW UP WITH HIS DOCTORS FOR FUTURE MONITORING I  . Arthritis    LEFT HAND, RT KNEE (08/14/2016)  . Basal cell carcinoma    "right side of my nose; think they burned it off" (08/14/2016)  . CAD (coronary artery disease)    a. s/p PCI to LAD/diagonal, subtotal of PDA 2006. b. Negative nuc 2014.  Marland Kitchen Chronic atrial fibrillation ([place])   . Chronic systolic CHF (congestive heart failure) (HCC)    a. EF 20-25% dx in 08/2016; etiology not yet clear.  . CKD (chronic kidney disease), stage  III    a. stage III-IV, Cr baseline appears 1.8-2.0  . COPD (chronic obstructive pulmonary disease) ([place])   . Depression   . GERD (gastroesophageal reflux disease)   . History of kidney stones   . History of pneumothorax    LONG TIME AGO  . Hyperlipidemia   . Hypertension   . Migraine    "very rare the last 20 years" (08/14/2016)  . Myocardial infarction ([place]) 2006  . Osteoarthritis   . Renal cell carcinoma ([place])   . Shingles outbreak    PT NOTICED SKIN RASH RT GROIN AND RIGHT TRUNK ON MONDAY 10/27/13 - NOT A LOT OF DISCOMFORT- NOT ON ANY ORAL MEDS TO TREAT.  . Sleep apnea    "dx'd; didn't try mask" (08/14/2016)  . Stroke [facility])    "CT showed I've had old strokes; never knew about it before today" (08/14/2016)    Past Surgical History:  Procedure Laterality Date  . ABDOMINAL AORTAGRAM N/A 09/24/2013   Procedure: ABDOMINAL Maxcine Ham;  Surgeon: Serafina Mitchell, MD;  Location: [facility] CATH LAB;  Service: Cardiovascular;  Laterality: N/A;  . ABDOMINAL AORTIC ANEURYSM REPAIR  JAN 2011   ENDOVASCULAR - DR. BRABHAM  . CARDIAC CATHETERIZATION  07/04/2004   Archie Endo 07/04/2004  . COLONOSCOPY  latest 2004   Dr Sammuel Cooper.  reported in 11/11/2008 PMD note as normal   . CORONARY ANGIOPLASTY WITH STENT PLACEMENT  07/05/2004  notes 07/05/2004  . ESOPHAGOGASTRODUODENOSCOPY N/A 10/12/2016   Procedure: ESOPHAGOGASTRODUODENOSCOPY (EGD);  Surgeon: Doran Stabler, MD;  Location: [facility] ENDOSCOPY;  Service: Endoscopy;  Laterality: N/A;  bedside procedure in [facility] [place]  . KNEE ARTHROSCOPY Right   . LITHOTRIPSY    . ROBOT ASSISTED LAPAROSCOPIC NEPHRECTOMY Left 11/06/2013   Procedure: ROBOTIC ASSISTED LAPAROSCOPIC RADICAL  NEPHRECTOMY;  Surgeon: Alexis Frock, MD;  Location: [facility] ORS;  Service: Urology;  Laterality: Left;  . TONSILLECTOMY  1964     Inpatient Medications: Scheduled Meds: . atorvastatin  40 mg Oral q1800  . carvedilol  12.5 mg Oral BID AC  . chlorhexidine  15 mL Mouth Rinse BID  . digoxin  0.0625 mg  Oral q morning - 10a  . famotidine  10 mg Oral BID  . furosemide  40 mg Intravenous Q12H  . isosorbide dinitrate  10 mg Oral TID  . mouth rinse  15 mL Mouth Rinse q12n4p  . pantoprazole  40 mg Oral BID  . [START ON 12/04/2016] potassium chloride  20 mEq Oral Once per day on Mon Wed Fri   And  . potassium chloride  40 mEq Oral Once per day on Sun Tue Thu Sat  . QUEtiapine  50 mg Oral BID  . sertraline  100 mg Oral QPM  . sodium chloride flush  3 mL Intravenous Q12H  . tamsulosin  0.4 mg Oral QPC breakfast  . traZODone  50 mg Oral QHS  . warfarin  7.5 mg Oral ONCE-1800  . Warfarin - Pharmacist Dosing Inpatient   Does not apply q1800   Continuous Infusions: . sodium chloride     PRN Meds: sodium chloride, acetaminophen **OR** acetaminophen, haloperidol lactate, HYDROcodone-acetaminophen, levalbuterol, ondansetron **OR** ondansetron (ZOFRAN) IV, polyethylene glycol, sodium chloride flush, traZODone  Allergies:    Allergies  Allergen Reactions  . Ace Inhibitors Cough    Social History:   Social History   Social History  . Marital status: Divorced    Spouse name: N/A  . Number of children: 2  . Years of education: N/A   Occupational History  .  Retired   Social History Main Topics  . Smoking status: Former Smoker    Packs/day: 1.50    Years: 23.00    Types: Cigarettes    Quit date: 08/24/1980  . Smokeless tobacco: Never Used  . Alcohol use 1.5 oz/week    3 Standard drinks or equivalent per week     Comment: 08/14/2016 "maybe 2-3 drinks/month average"  . Drug use: No  . Sexual activity: Not on file   Other Topics Concern  . Not on file   Social History Narrative   Retired   Divorced   2 sons 65 &30     Occupation:  Part time sports Tax adviser    Smoking Status:  quit (08/24/1980)   Packs/Day:  1.0    Caffeine use/day:  3 beverages daily    Does Patient Exercise:  no   Alcohol Use - yes    Family History:   The patient's family history includes Anuerysm in his  father; Coronary artery disease in his unknown relative; Heart disease in his mother; Hyperlipidemia in his unknown relative; Hypertension in his unknown relative; Prostate cancer in his unknown relative; Pulmonary embolism in his mother. There is no history of Other or Colon cancer.  ROS:  Please see the history of present illness.  ROS  All other ROS reviewed and negative.     Physical Exam/Data:   Vitals:  12/02/16 1800 12/02/16 2039 12/02/16 2204 12/03/16 0614  BP: (!) 159/109 (!) 149/112 (!) 143/108 (!) 151/104  Pulse: 97 (!) 113 96 62  Resp: 18 20 20  (!) 21  Temp:   97.7 F (36.5 C) 98.7 F (37.1 C)  TempSrc:  Oral Axillary Axillary  SpO2: 91% 95% 93% 91%  Weight:   205 lb 0.4 oz (93 kg) 204 lb 12.9 oz (92.9 kg)  Height:   6' (1.829 m)     Intake/Output Summary (Last 24 hours) at 12/03/16 1120 Last data filed at 12/03/16 0600  Gross per 24 hour  Intake                3 ml  Output              600 ml  Net             -597 ml   Filed Weights   12/02/16 2204 12/03/16 0614  Weight: 205 lb 0.4 oz (93 kg) 204 lb 12.9 oz (92.9 kg)   Body mass index is 27.78 kg/m.  General:  Chronically ill male  HEENT: normal Lymph: no adenopathy Neck: no JVD Endocrine:  No thryomegaly Vascular: No carotid bruits; FA pulses 2+ bilaterally without bruits  Cardiac:  normal S1, S2; RRR; MR murmur  Lungs:  clear to auscultation bilaterally, no wheezing, rhonchi or rales  Abd: soft, nontender, no hepatomegaly  Ext: no edema Musculoskeletal:  No deformities, BUE and BLE strength normal and equal Skin: warm and dry  Neuro:  CNs 2-12 intact, no focal abnormalities noted Psych:  Paranoid / Delusional   EKG:  The EKG was personally reviewed and demonstrates:  afib chronic LBBB Telemetry:  Telemetry was personally reviewed and demonstrates:  Afib rates 80-100 no long pauses   Relevant CV Studies: Echo : 08/15/16 EF 20-25% mild AR moderate MR   Laboratory Data:  Chemistry  Recent  Labs Lab 12/02/16 1651 12/03/16 0127  NA 143 143  K 3.5 3.5  CL 106 107  CO2 26 25  GLUCOSE 100* 134*  BUN 30* 31*  CREATININE 2.37* 2.25*  CALCIUM 8.7* 8.7*  GFRNONAA 26* 27*  GFRAA 30* 32*  ANIONGAP 11 11     Recent Labs Lab 12/02/16 1651 12/03/16 0127  PROT 6.6 6.2*  ALBUMIN 3.4* 3.2*  AST 22 24  ALT 8* 10*  ALKPHOS 87 79  BILITOT 1.4* 1.7*   Hematology  Recent Labs Lab 12/02/16 1651 12/03/16 0127  WBC 4.4 4.9  RBC 3.26* 3.18*  HGB 8.8* 8.5*  HCT 28.2* 27.5*  MCV 86.5 86.5  MCH 27.0 26.7  MCHC 31.2 30.9  RDW 18.9* 18.8*  PLT 196 193   Cardiac Enzymes  Recent Labs Lab 12/02/16 2100 12/03/16 0127 12/03/16 0713  TROPONINI 0.08* 0.07* 0.09*     Recent Labs Lab 12/02/16 1655  TROPIPOC 0.10*    BNP  Recent Labs Lab 12/02/16 1651  BNP 2,238.5*    DDimer No results for input(s): DDIMER in the last 168 hours.  Radiology/Studies:  Dg Chest 2 View  Result Date: 12/02/2016 CLINICAL DATA:  Shortness of breath.  History of CHF. EXAM: CHEST  2 VIEW COMPARISON:  Chest radiograph 10/17/2016 FINDINGS: Monitoring leads overlie the patient. Stable cardiomegaly. Pulmonary vascular redistribution and mild interstitial opacities bilaterally. Small bilateral pleural effusions. No pneumothorax. Thoracic spine degenerative changes. Suspect skin fold projecting over the lower right hemithorax. IMPRESSION: Cardiomegaly, pulmonary vascular redistribution and mild interstitial edema. Small bilateral pleural effusions. Electronically Signed  By: Lovey Newcomer M.D.   On: 12/02/2016 16:54    Assessment and Plan:   1. Elevated Troponin: known CAD no chest pain hospitalization complicated by Agitation, Delusions and elevated Cr. Troponin only .09 ECG chronic LBBB no indication for cath. Would consider outpatient myovue after other issues resolved. Continue home meds 2. Afib: rate control ok pharmacy dose coumadin can consider covering with heparin until INR Rx 3. CRF:  No  ACE does not look volume overloaded would not hydrate much with known EF 20-25% and elevated BNP 4. CHF: sleeping flat in bed CXR only mild interstitial edema continue lasix 5. MS Change:  On quetiapine, trazadone and sertraline at home consider head CT given dementia and being on coumadin    Signed, Jenkins Rouge, MD  12/03/2016 11:20 AM

## 2016-12-03 NOTE — Progress Notes (Signed)
Crystal from lab called with critical troponin 0.09. Pt asleep. Awake to voice. Denies CP or SOB. VS at baseline. MD updated.

## 2016-12-03 NOTE — Progress Notes (Signed)
PT Cancellation Note  Patient Details Name: Mark [name] MRN: [id number] DOB: [birth year]   Cancelled Treatment:    Reason Eval/Treat Not Completed: Medical issues which prohibited therapy (noted high BP/ will check back another time when stable. )   Marcelino Freestone PT [phone]  12/03/2016, 8:48 AM

## 2016-12-03 NOTE — Progress Notes (Signed)
Labs called with critical troponin. MD paged. VS charted. Arouses to voice. Pt denies chest pain. Denies Sob. Respirations 20bpm SPO2 91%3LNC. Pt with COPD diagnosis. Pt asleep in bed.

## 2016-12-03 NOTE — Progress Notes (Addendum)
Current bp remains elevated 161/125.  Pt remains agitated.  Sitter at bedside.  Any attempt to get bp while pt is calm creates agitation.  Day rn reported pts increasing troponis and BP and reports Dr. Emilee Hero Bonsu acknowledged.  Am getting ready to give HS 40 Lasix, Isosbride, Seroquel, and Trazadone.  HR manually is mostly in 83' except when increase agitation.  Dr. Hal Hope on call and notified.  Acknoweledge BP, increasing trops and hx of AAA.  New order for Labetolol given.  Unable to do orthostatic due to agitation.  Will continue to monitor

## 2016-12-03 NOTE — Progress Notes (Signed)
[facility]                                                                              Patient Demographics  Mark [name], is a 76 y.o. male, DOB - 08/27/1940, VXB:[id number]  Admit date - 12/02/2016   Admitting Physician Toy Baker, MD  Outpatient Primary MD for the patient is [name], NP  Outpatient specialists:   LOS - 1  days    Chief Complaint  Patient presents with  . Shortness of Breath       Brief summary  Mark [name] is a 76 y.o. male with medical history significant for but not limited to Alzheimer's dementia, CHF with Systolic dysfunction, CAD, AAA, atrial fibrillation on chronic Coumadin, and COPD admitted for respiratory failure with acute on chronic CHF after an episode of agitation and wanting to run away from SNF.   Assessment & Plan    Active Problems:   Hyperlipidemia   Depression   Essential hypertension   AAA (abdominal aortic aneurysm) (HCC)   Atrial fibrillation, chronic (HCC)   Acute systolic CHF (congestive heart failure) (HCC)   Alzheimer's dementia with behavioral disturbance   Elevated troponin   Acute combined systolic and diastolic CHF, NYHA class 1 (HCC)   #1 acute CHF: Daily weights Fluid restriction Input and output monitoring Diuresis with monitoring of electrolytes and renal function 2-D echocardiogram pending  #2 elevated troponin: History of CK D No evidence of ACS Cardiology consult appreciated - outpatient Myoview after acute episode resolves  #3 atrial fibrillation:  CHA2DS2 5 Continue Coumadin anticoagulation Rate control  #4 Alzheimer's dementia with behavioral disturbance: Psychotropic/psychoactive medications Supportive care Continue sitter for now Psychiatry consulted  Code Status: Full code DVT Prophylaxis:  On Coumadin Family Communication:    Disposition Plan: SNF  Time Spent in minutes   35 minutes  Procedures:    Consultants:   Cardiology-Dr.  Johnsie Cancel  Antimicrobials:      Medications  Scheduled Meds: . atorvastatin  40 mg Oral q1800  . carvedilol  12.5 mg Oral BID AC  . chlorhexidine  15 mL Mouth Rinse BID  . digoxin  0.0625 mg Oral q morning - 10a  . famotidine  10 mg Oral BID  . furosemide  40 mg Intravenous Q12H  . isosorbide dinitrate  10 mg Oral TID  . mouth rinse  15 mL Mouth Rinse q12n4p  . pantoprazole  40 mg Oral BID  . [START ON 12/04/2016] potassium chloride  20 mEq Oral Once per day on Mon Wed Fri   And  . potassium chloride  40 mEq Oral Once per day on Sun Tue Thu Sat  . QUEtiapine  50 mg Oral BID  . sertraline  100 mg Oral QPM  . sodium chloride flush  3 mL Intravenous Q12H  . tamsulosin  0.4 mg Oral QPC breakfast  . traZODone  50 mg Oral QHS  . warfarin  7.5 mg Oral ONCE-1800  . Warfarin - Pharmacist Dosing Inpatient   Does not apply q1800   Continuous Infusions: . sodium chloride     PRN Meds:.sodium chloride, acetaminophen **OR** acetaminophen, haloperidol lactate, HYDROcodone-acetaminophen,  levalbuterol, ondansetron **OR** ondansetron (ZOFRAN) IV, polyethylene glycol, sodium chloride flush, traZODone   Antibiotics   Anti-infectives    None        Subjective:   Mark [name] was seen and examined today.  Admitting H&P as well as consulting physicians notes reviewed and noted. Patient is resting comfortably in bed on O2 by nasal cannula. He sedated but arousable and able to give any consistent history at this time. No acute events reported by nursing overnight. No fever noted  Objective:   Vitals:   12/02/16 1800 12/02/16 2039 12/02/16 2204 12/03/16 0614  BP: (!) 159/109 (!) 149/112 (!) 143/108 (!) 151/104  Pulse: 97 (!) 113 96 62  Resp: 18 20 20  (!) 21  Temp:   97.7 F (36.5 C) 98.7 F (37.1 C)  TempSrc:  Oral Axillary Axillary  SpO2: 91% 95% 93% 91%  Weight:   93 kg (205 lb 0.4 oz) 92.9 kg (204 lb 12.9 oz)  Height:   6' (1.829 m)     Intake/Output Summary (Last 24 hours) at  12/03/16 1044 Last data filed at 12/03/16 0600  Gross per 24 hour  Intake                3 ml  Output              600 ml  Net             -597 ml     Wt Readings from Last 3 Encounters:  12/03/16 92.9 kg (204 lb 12.9 oz)  11/27/16 94.3 kg (208 lb)  11/23/16 93.9 kg (207 lb)     Exam  General: NAD  HEENT: NCAT,  PERRL,MMM  Neck: SUPPLE, (-) JVD  Cardiovascular: RRR, (-) GALLOP, (-) MURMUR  Respiratory: Diminished breath sounds at the bases otherwise unremarkable  Gastrointestinal: SOFT, (-) DISTENSION, BS(+), (_) TENDERNESS  Ext: (-) CYANOSIS, bipedal pitting edema   Neuro: Sedated but arousable, moves all extremities  Skin:(-) RASH     Data Reviewed:  I have personally reviewed following labs and imaging studies  Micro Results Recent Results (from the past 240 hour(s))  MRSA PCR Screening     Status: None   Collection Time: 12/02/16 10:21 PM  Result Value Ref Range Status   MRSA by PCR NEGATIVE NEGATIVE Final    Comment:        The GeneXpert MRSA Assay (FDA approved for NASAL specimens only), is one component of a comprehensive MRSA colonization surveillance program. It is not intended to diagnose MRSA infection nor to guide or monitor treatment for MRSA infections.     Radiology Reports Dg Chest 2 View  Result Date: 12/02/2016 CLINICAL DATA:  Shortness of breath.  History of CHF. EXAM: CHEST  2 VIEW COMPARISON:  Chest radiograph 10/17/2016 FINDINGS: Monitoring leads overlie the patient. Stable cardiomegaly. Pulmonary vascular redistribution and mild interstitial opacities bilaterally. Small bilateral pleural effusions. No pneumothorax. Thoracic spine degenerative changes. Suspect skin fold projecting over the lower right hemithorax. IMPRESSION: Cardiomegaly, pulmonary vascular redistribution and mild interstitial edema. Small bilateral pleural effusions. Electronically Signed   By: Lovey Newcomer M.D.   On: 12/02/2016 16:54    Lab  Data:  CBC:  Recent Labs Lab 12/02/16 1651 12/03/16 0127  WBC 4.4 4.9  HGB 8.8* 8.5*  HCT 28.2* 27.5*  MCV 86.5 86.5  PLT 196 749   Basic Metabolic Panel:  Recent Labs Lab 12/02/16 1651 12/03/16 0127  NA 143 143  K 3.5 3.5  CL  106 107  CO2 26 25  GLUCOSE 100* 134*  BUN 30* 31*  CREATININE 2.37* 2.25*  CALCIUM 8.7* 8.7*  MG  --  2.1  PHOS  --  4.0   GFR: Estimated Creatinine Clearance: 32.1 mL/min (A) (by C-G formula based on SCr of 2.25 mg/dL (H)). Liver Function Tests:  Recent Labs Lab 12/02/16 1651 12/03/16 0127  AST 22 24  ALT 8* 10*  ALKPHOS 87 79  BILITOT 1.4* 1.7*  PROT 6.6 6.2*  ALBUMIN 3.4* 3.2*   No results for input(s): LIPASE, AMYLASE in the last 168 hours. No results for input(s): AMMONIA in the last 168 hours. Coagulation Profile:  Recent Labs Lab 12/02/16 1651 12/03/16 0127  INR 1.54 1.61   Cardiac Enzymes:  Recent Labs Lab 12/02/16 2100 12/03/16 0127 12/03/16 0713  TROPONINI 0.08* 0.07* 0.09*   BNP (last 3 results) No results for input(s): PROBNP in the last 8760 hours. HbA1C: No results for input(s): HGBA1C in the last 72 hours. CBG: No results for input(s): GLUCAP in the last 168 hours. Lipid Profile: No results for input(s): CHOL, HDL, LDLCALC, TRIG, CHOLHDL, LDLDIRECT in the last 72 hours. Thyroid Function Tests:  Recent Labs  12/03/16 0127  TSH 5.016*   Anemia Panel: No results for input(s): VITAMINB12, FOLATE, FERRITIN, TIBC, IRON, RETICCTPCT in the last 72 hours. Urine analysis:    Component Value Date/Time   COLORURINE YELLOW 12/02/2016 1946   APPEARANCEUR CLEAR 12/02/2016 1946   LABSPEC 1.006 12/02/2016 1946   PHURINE 7.0 12/02/2016 1946   GLUCOSEU NEGATIVE 12/02/2016 1946   GLUCOSEU NEGATIVE 07/17/2014 0945   HGBUR NEGATIVE 12/02/2016 1946   BILIRUBINUR NEGATIVE 12/02/2016 1946   KETONESUR NEGATIVE 12/02/2016 1946   PROTEINUR NEGATIVE 12/02/2016 1946   UROBILINOGEN 0.2 07/17/2014 0945   NITRITE  NEGATIVE 12/02/2016 1946   LEUKOCYTESUR NEGATIVE 12/02/2016 1946     OSEI-BONSU,Shanley Furlough M.D. [facility] 12/03/2016, 10:44 AM  Pager: [phone] Between 7am to 7pm - call Pager - [phone]  After 7pm go to [email or web address] - password [email or web address]  Call night coverage person covering after 7pm

## 2016-12-03 NOTE — Progress Notes (Signed)
CRITICAL VALUE ALERT  Critical Value:  0.23 troponin  Date & Time Notied:  12/03/16 1708  Provider Notified: 12/03/16 1708  Orders Received/Actions taken: 7/29 1715

## 2016-12-03 NOTE — Progress Notes (Signed)
CRITICAL VALUE ALERT  Critical Value:  Troponin 0.09  Date & Time Notied:  12/03/16 0845  Provider Notified: Emilee Hero 0848  Orders Received/Actions taken: 7/29 1586

## 2016-12-03 NOTE — Consult Note (Signed)
Patient seen lying bed, he is confused and unable to give information. Case discussed with the hospitalist who agreed that patient can be seen at a later date when he is more alert. Hospitalist already restarted patient's home medications. Plan: Re-consult psych service when patient is alert.  Corena Pilgrim, MD

## 2016-12-03 NOTE — Progress Notes (Signed)
Pt refused orthostatic VS. When attempting to stand pt, pt trying to leave room. Difficult to re-direct. Pt continues to deny pain or discomfort.

## 2016-12-04 ENCOUNTER — Inpatient Hospital Stay (HOSPITAL_COMMUNITY): Payer: Medicare HMO

## 2016-12-04 DIAGNOSIS — I350 Nonrheumatic aortic (valve) stenosis: Secondary | ICD-10-CM

## 2016-12-04 DIAGNOSIS — F0281 Dementia in other diseases classified elsewhere with behavioral disturbance: Secondary | ICD-10-CM

## 2016-12-04 DIAGNOSIS — G309 Alzheimer's disease, unspecified: Secondary | ICD-10-CM

## 2016-12-04 DIAGNOSIS — I5041 Acute combined systolic (congestive) and diastolic (congestive) heart failure: Secondary | ICD-10-CM

## 2016-12-04 LAB — PROTIME-INR
INR: 2.09
PROTHROMBIN TIME: 23.8 s — AB (ref 11.4–15.2)

## 2016-12-04 LAB — CBC
HCT: 28.3 % — ABNORMAL LOW (ref 39.0–52.0)
Hemoglobin: 8.7 g/dL — ABNORMAL LOW (ref 13.0–17.0)
MCH: 26.6 pg (ref 26.0–34.0)
MCHC: 30.7 g/dL (ref 30.0–36.0)
MCV: 86.5 fL (ref 78.0–100.0)
PLATELETS: 178 10*3/uL (ref 150–400)
RBC: 3.27 MIL/uL — AB (ref 4.22–5.81)
RDW: 18.7 % — AB (ref 11.5–15.5)
WBC: 3.5 10*3/uL — AB (ref 4.0–10.5)

## 2016-12-04 LAB — ECHOCARDIOGRAM COMPLETE
Height: 72 in
WEIGHTICAEL: 3209.9 [oz_av]

## 2016-12-04 LAB — BASIC METABOLIC PANEL
Anion gap: 10 (ref 5–15)
BUN: 30 mg/dL — AB (ref 6–20)
CO2: 27 mmol/L (ref 22–32)
CREATININE: 2.24 mg/dL — AB (ref 0.61–1.24)
Calcium: 8.7 mg/dL — ABNORMAL LOW (ref 8.9–10.3)
Chloride: 107 mmol/L (ref 101–111)
GFR calc Af Amer: 32 mL/min — ABNORMAL LOW (ref >60)
GFR, EST NON AFRICAN AMERICAN: 27 mL/min — AB (ref >60)
GLUCOSE: 112 mg/dL — AB (ref 65–99)
Potassium: 3.8 mmol/L (ref 3.5–5.1)
SODIUM: 144 mmol/L (ref 135–145)

## 2016-12-04 LAB — BRAIN NATRIURETIC PEPTIDE: B Natriuretic Peptide: 3613.9 pg/mL — ABNORMAL HIGH (ref 0.0–100.0)

## 2016-12-04 MED ORDER — QUETIAPINE FUMARATE 25 MG PO TABS
25.0000 mg | ORAL_TABLET | Freq: Two times a day (BID) | ORAL | Status: DC
Start: 2016-12-04 — End: 2016-12-07
  Administered 2016-12-04 – 2016-12-07 (×6): 25 mg via ORAL
  Filled 2016-12-04 (×6): qty 1

## 2016-12-04 MED ORDER — WARFARIN SODIUM 5 MG PO TABS
5.0000 mg | ORAL_TABLET | Freq: Once | ORAL | Status: AC
  Administered 2016-12-04: 5 mg via ORAL
  Filled 2016-12-04: qty 1

## 2016-12-04 MED ORDER — PERFLUTREN LIPID MICROSPHERE
INTRAVENOUS | Status: AC
  Filled 2016-12-04: qty 10

## 2016-12-04 MED ORDER — ENSURE ENLIVE PO LIQD
237.0000 mL | Freq: Two times a day (BID) | ORAL | Status: DC
  Administered 2016-12-04 – 2016-12-07 (×4): 237 mL via ORAL

## 2016-12-04 MED ORDER — PERFLUTREN LIPID MICROSPHERE
1.0000 mL | INTRAVENOUS | Status: AC | PRN
  Administered 2016-12-04: 2 mL via INTRAVENOUS
  Filled 2016-12-04: qty 10

## 2016-12-04 NOTE — NC FL2 (Signed)
[place] LEVEL OF CARE SCREENING TOOL     IDENTIFICATION  Patient Name: Mark [name] Birthdate: 18-Feb-1941 Sex: male Admission Date (Current Location): 12/02/2016  [facility] and [place] Number:  Herbalist and Address:  [facility] [place],  [place] [address], [place]      Provider Number: [id number]  Attending Physician Name and Address:  Elmarie Shiley, MD  Relative Name and Phone Number:       Current Level of Care: Hospital Recommended Level of Care: [place], Memory Care Prior Approval Number:    Date Approved/Denied:   PASRR Number: [id number]  Discharge Plan: SNF    Current Diagnoses: Patient Active Problem List   Diagnosis Date Noted  . Elevated troponin 12/02/2016  . Acute combined systolic and diastolic CHF, NYHA class 1 ([place]) 12/02/2016  . Pulmonary edema   . GI bleed 10/11/2016  . Subacute delirium 10/07/2016  . Cerebrovascular accident (CVA) ([place])   . AKI (acute kidney injury) ([place]) 10/06/2016  . Alzheimer's dementia with behavioral disturbance   . Hallucinations   . LBBB (left bundle branch block)   . Acute systolic CHF (congestive heart failure) ([place]) 08/16/2016  . Acute encephalopathy 08/14/2016  . Acute on chronic respiratory failure with hypoxia ([place]) 08/14/2016  . Asthma 03/24/2016  . Preventative health care 09/22/2015  . Osteopenia 09/07/2014  . Osteoarthritis, hand 04/09/2014  . Normocytic anemia 04/09/2014  . OSA (obstructive sleep apnea) 01/08/2014  . Daytime somnolence 11/20/2013  . Renal cell carcinoma ([place]) 11/06/2013  . AAA (abdominal aortic aneurysm) without rupture ([place]) 09/22/2013  . Atrial fibrillation, chronic ([place]) 07/25/2012  . 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 BPH (benign prostatic hyperplasia) 12/21/2009  . AAA (abdominal aortic aneurysm) ([place]) 05/28/2009  . Disorder resulting from impaired renal function 05/28/2009  . Hyperlipidemia  11/11/2008  . Depression 11/11/2008  . Essential hypertension 11/11/2008  . Coronary atherosclerosis 11/11/2008  . GERD 11/11/2008  . SKIN CANCER, HX OF 11/11/2008  . NEPHROLITHIASIS, HX OF 11/11/2008    Orientation RESPIRATION BLADDER Height & Weight     Self  O2 ([place] 4L/min) Continent Weight: 200 lb 9.9 oz (91 kg) Height:  6' (182.9 cm)  BEHAVIORAL SYMPTOMS/MOOD NEUROLOGICAL BOWEL NUTRITION STATUS  [place]     Diet (2 gram sodium)  AMBULATORY STATUS COMMUNICATION OF NEEDS Skin   Limited Assist Verbally Normal                       Personal Care Assistance Level of Assistance  Bathing, Feeding, Dressing Bathing Assistance: Limited assistance Feeding assistance: Limited assistance Dressing Assistance: Limited assistance     Functional Limitations Info             SPECIAL CARE FACTORS FREQUENCY  PT (By licensed PT), OT (By licensed OT)     PT Frequency: 5x OT Frequency: 5x            Contractures Contractures Info: Not present    Additional Factors Info  Code Status, Allergies Code Status Info: Full Code Allergies Info: Ace Inhibitors           Current Medications (12/04/2016):  This is the current hospital active medication list Current Facility-Administered Medications  Medication Dose Route Frequency Provider Last Rate Last Dose  . 0.9 %  sodium chloride infusion  250 mL Intravenous PRN Doutova, Anastassia, MD      . acetaminophen (TYLENOL) tablet 650 mg  650 mg Oral Q6H  PRN Toy Baker, MD       Or  . acetaminophen (TYLENOL) suppository 650 mg  650 mg Rectal Q6H PRN Doutova, Anastassia, MD      . atorvastatin (LIPITOR) tablet 40 mg  40 mg Oral q1800 Doutova, Anastassia, MD   40 mg at 12/03/16 1821  . carvedilol (COREG) tablet 12.5 mg  12.5 mg Oral BID AC Doutova, Anastassia, MD   12.5 mg at 12/04/16 0804  . chlorhexidine (PERIDEX) 0.12 % solution 15 mL  15 mL Mouth Rinse BID Doutova, Anastassia, MD   15 mL at 12/04/16 1002  . digoxin  (LANOXIN) tablet 0.0625 mg  0.0625 mg Oral q morning - 10a Doutova, Anastassia, MD   0.0625 mg at 12/04/16 1002  . famotidine (PEPCID) tablet 10 mg  10 mg Oral BID Toy Baker, MD   10 mg at 12/04/16 1002  . feeding supplement (ENSURE ENLIVE) (ENSURE ENLIVE) liquid 237 mL  237 mL Oral BID BM Regalado, Belkys A, MD      . furosemide (LASIX) injection 40 mg  40 mg Intravenous Q12H Doutova, Anastassia, MD   40 mg at 12/04/16 1003  . HYDROcodone-acetaminophen (NORCO/VICODIN) 5-325 MG per tablet 1-2 tablet  1-2 tablet Oral Q4H PRN Toy Baker, MD   2 tablet at 12/04/16 0054  . isosorbide dinitrate (ISORDIL) tablet 10 mg  10 mg Oral TID Toy Baker, MD   10 mg at 12/04/16 1002  . labetalol (NORMODYNE,TRANDATE) injection 5 mg  5 mg Intravenous Q2H PRN Rise Patience, MD   5 mg at 12/04/16 0050  . levalbuterol (XOPENEX) nebulizer solution 0.63 mg  0.63 mg Nebulization Q6H PRN Toy Baker, MD   0.63 mg at 12/03/16 2228  . MEDLINE mouth rinse  15 mL Mouth Rinse q12n4p Doutova, Anastassia, MD   15 mL at 12/03/16 1217  . ondansetron (ZOFRAN) tablet 4 mg  4 mg Oral Q6H PRN Doutova, Anastassia, MD       Or  . ondansetron (ZOFRAN) injection 4 mg  4 mg Intravenous Q6H PRN Doutova, Anastassia, MD      . pantoprazole (PROTONIX) EC tablet 40 mg  40 mg Oral BID Doutova, Anastassia, MD   40 mg at 12/04/16 1002  . polyethylene glycol (MIRALAX / GLYCOLAX) packet 17 g  17 g Oral Daily PRN Doutova, Anastassia, MD      . QUEtiapine (SEROQUEL) tablet 25 mg  25 mg Oral BID Ambrose Finland, MD      . sertraline (ZOLOFT) tablet 100 mg  100 mg Oral QPM Doutova, Anastassia, MD   100 mg at 12/03/16 1821  . sodium chloride flush (NS) 0.9 % injection 3 mL  3 mL Intravenous Q12H Doutova, Anastassia, MD   3 mL at 12/04/16 1013  . sodium chloride flush (NS) 0.9 % injection 3 mL  3 mL Intravenous PRN Doutova, Anastassia, MD      . tamsulosin (FLOMAX) capsule 0.4 mg  0.4 mg Oral QPC  breakfast Doutova, Anastassia, MD   0.4 mg at 12/04/16 0804  . traZODone (DESYREL) tablet 25 mg  25 mg Oral BID PRN Toy Baker, MD   25 mg at 12/04/16 0048  . traZODone (DESYREL) tablet 50 mg  50 mg Oral QHS Toy Baker, MD   50 mg at 12/03/16 2213  . warfarin (COUMADIN) tablet 5 mg  5 mg Oral ONCE-1800 Glogovac, Nikola, RPH      . Warfarin - Pharmacist Dosing Inpatient   Does not apply [id number] Toy Baker, MD  Discharge Medications: Please see discharge summary for a list of discharge medications.  Relevant Imaging Results:  Relevant Lab Results:   Additional Information SSN: [id number]  [place], LCSW

## 2016-12-04 NOTE — Evaluation (Signed)
Occupational Therapy Evaluation Patient Details Name: Mark [name] MRN: [id number] DOB: 01/16/1941 Today's Date: 12/04/2016    History of Present Illness 76 y.o. male with medical history significant for but not limited to Alzheimer's dementia, CHF with Systolic dysfunction, CAD, AAA, atrial fibrillation on chronic Coumadin, and COPD admitted for respiratory failure with acute on chronic CHF after an episode of agitation and wanting to run away from SNF.   Clinical Impression   Pt admitted with RF. Pt currently with functional limitations due to the deficits listed below (see OT Problem List).  Pt will benefit from skilled OT to increase their safety and independence with ADL and functional mobility for ADL to facilitate discharge to venue listed below.      Follow Up Recommendations  SNF    Equipment Recommendations  None recommended by OT       Precautions / Restrictions Precautions Precautions: Fall Precaution Comments: pt reports h/o 2 falls in past 1 year Restrictions Weight Bearing Restrictions: No      Mobility Bed Mobility Overal bed mobility: Needs Assistance Bed Mobility: Supine to Sit     Supine to sit: Supervision;HOB elevated     General bed mobility comments: pt in chair  Transfers Overall transfer level: Needs assistance Equipment used: Rolling walker (2 wheeled) Transfers: Sit to/from Stand Sit to Stand: From elevated surface;Mod assist         General transfer comment: min A to rise from bed, VCs hand placement    Balance Overall balance assessment: Needs assistance   Sitting balance-Leahy Scale: Good       Standing balance-Leahy Scale: Fair                             ADL either performed or assessed with clinical judgement   ADL Overall ADL's : Needs assistance/impaired Eating/Feeding: Sitting;Minimal assistance   Grooming: Minimal assistance;Sitting                                 General ADL Comments:  pt fed self lunch and needed increased time.       Vision Patient Visual Report: No change from baseline              Pertinent Vitals/Pain Pain Assessment: No/denies pain     Hand Dominance     Extremity/Trunk Assessment Upper Extremity Assessment Upper Extremity Assessment: Generalized weakness   Lower Extremity Assessment Lower Extremity Assessment: Overall WFL for tasks assessed   Cervical / Trunk Assessment Cervical / Trunk Assessment: Normal   Communication Communication Communication: No difficulties   Cognition Arousal/Alertness: Awake/alert Behavior During Therapy: Impulsive Overall Cognitive Status: No family/caregiver present to determine baseline cognitive functioning                                                Home Living Family/patient expects to be discharged to:: Skilled nursing facility                                 Additional Comments: at SNF since [facility] hospital 10/26/16      Prior Functioning/Environment Level of Independence: Independent        Comments: PT eval from  June stated pt walks without a device, however pt stated he sometimes uses a RW, no family present, uses 3L home O2         OT Problem List: Decreased strength;Decreased activity tolerance;Impaired balance (sitting and/or standing)      OT Treatment/Interventions: Self-care/ADL training;Patient/family education;DME and/or AE instruction    OT Goals(Current goals can be found in the care plan section) Acute Rehab OT Goals Patient Stated Goal: did not state OT Goal Formulation: With patient Time For Goal Achievement: 12/18/16 Potential to Achieve Goals: Good  OT Frequency: Min 2X/week   Barriers to D/C:               AM-PAC PT "6 Clicks" Daily Activity     Outcome Measure Help from another person eating meals?: A Little Help from another person taking care of personal grooming?: A Little Help from another person  toileting, which includes using toliet, bedpan, or urinal?: A Lot Help from another person bathing (including washing, rinsing, drying)?: Total Help from another person to put on and taking off regular upper body clothing?: A Lot Help from another person to put on and taking off regular lower body clothing?: Total 6 Click Score: 12   End of Session Nurse Communication: Mobility status  Activity Tolerance: Patient tolerated treatment well Patient left: in chair;with nursing/sitter in room;with chair alarm set  OT Visit Diagnosis: Unsteadiness on feet (R26.81);Muscle weakness (generalized) (M62.81)                Time: 1093-2355 OT Time Calculation (min): 30 min Charges:  OT General Charges $OT Visit: 1 Procedure OT Evaluation $OT Eval Moderate Complexity: 1 Procedure OT Treatments $Self Care/Home Management : 8-22 mins G-Codes:     Kari Baars, [place]  Payton Mccallum D 12/04/2016, 3:43 PM

## 2016-12-04 NOTE — Evaluation (Signed)
Physical Therapy Evaluation Patient Details Name: Mark [name] MRN: [id number] DOB: 11-24-1940 Today's Date: 12/04/2016   History of Present Illness  76 y.o. male with medical history significant for but not limited to Alzheimer's dementia, CHF with Systolic dysfunction, CAD, AAA, atrial fibrillation on chronic Coumadin, and COPD admitted for respiratory failure with acute on chronic CHF after an episode of agitation and wanting to run away from SNF.  Clinical Impression  Pt admitted with above diagnosis. Pt currently with functional limitations due to the deficits listed below (see PT Problem List). Pt ambulated 65' with min/guard assist and RW. Pt had 2/4 dyspnea with walking, pulse oximeter gave poor reading so will assess SaO2 next visit.  Pt will benefit from skilled PT to increase their independence and safety with mobility to allow discharge to the venue listed below.       Follow Up Recommendations SNF;Supervision/Assistance - 24 hour    Equipment Recommendations  None recommended by PT    Recommendations for Other Services       Precautions / Restrictions Precautions Precautions: Fall Precaution Comments: pt reports h/o 2 falls in past 1 year Restrictions Weight Bearing Restrictions: No      Mobility  Bed Mobility Overal bed mobility: Needs Assistance Bed Mobility: Supine to Sit     Supine to sit: Supervision;HOB elevated     General bed mobility comments: VCs for technique  Transfers Overall transfer level: Needs assistance Equipment used: Rolling walker (2 wheeled) Transfers: Sit to/from Stand Sit to Stand: Min assist;From elevated surface         General transfer comment: min A to rise from bed, VCs hand placement  Ambulation/Gait Ambulation/Gait assistance: Min guard Ambulation Distance (Feet): 90 Feet Assistive device: Rolling walker (2 wheeled) Gait Pattern/deviations: Step-through pattern;Decreased step length - right;Decreased step length -  left   Gait velocity interpretation: at or above normal speed for age/gender General Gait Details: ambulated with 3L O2, 2/4 dyspnea, unable to obtain reliable SaO2 reading as dynamap had poor waveform, noted pt tends to mouth breathe, VCs to inhale thru nose, RW seems to get in pt's way (he sometimes carried it), will assess gait without AD next visit  Stairs            Wheelchair Mobility    Modified Rankin (Stroke Patients Only)       Balance Overall balance assessment: Needs assistance   Sitting balance-Leahy Scale: Good       Standing balance-Leahy Scale: Fair                               Pertinent Vitals/Pain Pain Assessment: No/denies pain    Home Living Family/patient expects to be discharged to:: Skilled nursing facility                 Additional Comments: at SNF since DC from [facility] hospital 10/26/16    Prior Function Level of Independence: Independent         Comments: PT eval from June stated pt walks without a device, however pt stated he sometimes uses a RW, no family present, uses 3L home O2      Hand Dominance        Extremity/Trunk Assessment   Upper Extremity Assessment Upper Extremity Assessment: Overall WFL for tasks assessed    Lower Extremity Assessment Lower Extremity Assessment: Overall WFL for tasks assessed    Cervical / Trunk Assessment Cervical / Trunk Assessment: Normal  Communication   Communication: No difficulties  Cognition Arousal/Alertness: Awake/alert Behavior During Therapy: Impulsive Overall Cognitive Status: No family/caregiver present to determine baseline cognitive functioning                                        General Comments      Exercises     Assessment/Plan    PT Assessment Patient needs continued PT services  PT Problem List Decreased activity tolerance;Cardiopulmonary status limiting activity;Decreased mobility       PT Treatment Interventions DME  instruction;Functional mobility training;Therapeutic exercise;Patient/family education;Therapeutic activities    PT Goals (Current goals can be found in the Care Plan section)  Acute Rehab PT Goals PT Goal Formulation: Patient unable to participate in goal setting Time For Goal Achievement: 12/18/16 Potential to Achieve Goals: Good    Frequency Min 3X/week   Barriers to discharge        Co-evaluation               AM-PAC PT "6 Clicks" Daily Activity  Outcome Measure Difficulty turning over in bed (including adjusting bedclothes, sheets and blankets)?: A Little Difficulty moving from lying on back to sitting on the side of the bed? : A Little Difficulty sitting down on and standing up from a chair with arms (e.g., wheelchair, bedside commode, etc,.)?: Total Help needed moving to and from a bed to chair (including a wheelchair)?: A Little Help needed walking in hospital room?: A Little Help needed climbing 3-5 steps with a railing? : A Lot 6 Click Score: 15    End of Session Equipment Utilized During Treatment: Gait belt;Oxygen Activity Tolerance: Patient tolerated treatment well;Patient limited by fatigue Patient left: in chair;with call bell/phone within reach;with nursing/sitter in room;with chair alarm set Nurse Communication: Mobility status PT Visit Diagnosis: Difficulty in walking, not elsewhere classified (R26.2)    Time: 6004-5997 PT Time Calculation (min) (ACUTE ONLY): 24 min   Charges:   PT Evaluation $PT Eval Moderate Complexity: 1 Mod     PT G CodesPhilomena Doheny 12/04/2016, 1:46 PM [phone]

## 2016-12-04 NOTE — Progress Notes (Signed)
[place] for coumadin Indication: atrial fibrillation  Allergies  Allergen Reactions  . Ace Inhibitors Cough    Patient Measurements: Height: 6' (182.9 cm) Weight: 200 lb 9.9 oz (91 kg) IBW/kg (Calculated) : 77.6   Vital Signs: Temp: 97 F (36.1 C) (07/30 0615) Temp Source: Axillary (07/30 0615) BP: 124/88 (07/30 0804) Pulse Rate: 71 (07/30 0804)  Labs:  Recent Labs  12/02/16 1651  12/03/16 0127  12/03/16 1106 12/03/16 1613 12/03/16 2310 12/04/16 0626  HGB 8.8*  --  8.5*  --   --   --   --  8.7*  HCT 28.2*  --  27.5*  --   --   --   --  28.3*  PLT 196  --  193  --   --   --   --  178  LABPROT 18.7*  --  19.3*  --   --   --   --  23.8*  INR 1.54  --  1.61  --   --   --   --  2.09  CREATININE 2.37*  --  2.25*  --  2.19*  --   --  2.24*  TROPONINI  --   < > 0.07*  < > 0.14* 0.23* 0.21*  --   < > = values in this interval not displayed.  Estimated Creatinine Clearance: 32.2 mL/min (A) (by C-G formula based on SCr of 2.24 mg/dL (H)).   Medical History: Past Medical History:  Diagnosis Date  . AAA (abdominal aortic aneurysm) (HCC)    9.1 cm repair 05/2009 ( ENDOVASCULAR REPAIR ).  MORE RECENT RE-EVALUATION OF ENLARGING AAA BY DR. Trula Slade AND BY DR. Kathlene Cote  MAY 2015 - PT STATES HE UNDERSTANDS THAT NOTHING IS LEAKING AT PRESENT TIME AND HE IS TO FOLLOW UP WITH HIS DOCTORS FOR FUTURE MONITORING I  . Arthritis    LEFT HAND, RT KNEE (08/14/2016)  . Basal cell carcinoma    "right side of my nose; think they burned it off" (08/14/2016)  . CAD (coronary artery disease)    a. s/p PCI to LAD/diagonal, subtotal of PDA 2006. b. Negative nuc 2014.  Marland Kitchen Chronic atrial fibrillation ([place])   . Chronic systolic CHF (congestive heart failure) (HCC)    a. EF 20-25% dx in 08/2016; etiology not yet clear.  . CKD (chronic kidney disease), stage III    a. stage III-IV, Cr baseline appears 1.8-2.0  . COPD (chronic obstructive pulmonary disease) ([place])    . Depression   . GERD (gastroesophageal reflux disease)   . History of kidney stones   . History of pneumothorax    LONG TIME AGO  . Hyperlipidemia   . Hypertension   . Migraine    "very rare the last 20 years" (08/14/2016)  . Myocardial infarction ([place]) 2006  . Osteoarthritis   . Renal cell carcinoma ([place])   . Shingles outbreak    PT NOTICED SKIN RASH RT GROIN AND RIGHT TRUNK ON MONDAY 10/27/13 - NOT A LOT OF DISCOMFORT- NOT ON ANY ORAL MEDS TO TREAT.  . Sleep apnea    "dx'd; didn't try mask" (08/14/2016)  . Stroke [facility])    "CT showed I've had old strokes; never knew about it before today" (08/14/2016)    Medications:  Prescriptions Prior to Admission  Medication Sig Dispense Refill Last Dose  . albuterol (PROVENTIL HFA;VENTOLIN HFA) 108 (90 Base) MCG/ACT inhaler Inhale 2 puffs into the lungs every 6 (six) hours as needed for wheezing  or shortness of breath. 1 Inhaler 0 Past Month at Unknown time  . atorvastatin (LIPITOR) 40 MG tablet TAKE 1 TABLET ONE TIME DAILY (Patient taking differently: Take 40 mg by mouth daily at 6 PM. TAKE 1 TABLET ONE TIME DAILY) 90 tablet 1 12/01/2016 at Unknown time  . carvedilol (COREG) 12.5 MG tablet Take 12.5 mg by mouth 2 (two) times daily.   12/02/2016 at 1700  . Coenzyme Q10 200 MG capsule Take 200 mg by mouth daily.   12/02/2016 at Unknown time  . digoxin 62.5 MCG TABS Please take 0.125 mg oral daily for 1 days, then change to 0.0625 mg oral daily on 6/23. (Patient taking differently: Take 0.0625 mg by mouth daily. Please take 0.125 mg oral daily for 1 days, then change to 0.0625 mg oral daily on 6/23.)   12/02/2016 at Unknown time  . isosorbide dinitrate (ISORDIL) 10 MG tablet Take 1 tablet (10 mg total) by mouth 3 (three) times daily.   12/02/2016 at 1500  . levalbuterol (XOPENEX) 0.63 MG/3ML nebulizer solution Take 3 mLs (0.63 mg total) by nebulization every 6 (six) hours as needed for wheezing or shortness of breath. 3 mL 12 Past Month at Unknown time  .  Multiple Vitamins-Minerals (MACUVITE EYE CARE PO) Take 1 capsule by mouth daily.   12/02/2016 at Unknown time  . Omega-3 Fatty Acids (FISH OIL) 1200 MG CAPS Take 1,200 mg by mouth daily.    12/02/2016 at Unknown time  . OXYGEN Inhale 3 L/min into the lungs continuous.   12/02/2016 at Unknown time  . pantoprazole (PROTONIX) 40 MG tablet Take 1 tablet (40 mg total) by mouth 2 (two) times daily.   12/02/2016 at Unknown time  . polyethylene glycol (MIRALAX / GLYCOLAX) packet Take 17 g by mouth daily as needed. 14 each 0 Past Month at Unknown time  . potassium chloride SA (K-DUR,KLOR-CON) 20 MEQ tablet Take 20-40 mEq by mouth as directed. Take 1 tablet to = 20 mEq M-W-F, take 2 tablets to = 40 mEq Tue-Thu-Sat-Sun    12/02/2016 at Unknown time  . QUEtiapine (SEROQUEL) 50 MG tablet Take 50 mg by mouth 2 (two) times daily.   12/02/2016 at Unknown time  . ranitidine (ZANTAC) 150 MG tablet Take 1 tablet (150 mg total) by mouth 2 (two) times daily. 180 tablet 1 12/02/2016 at Unknown time  . sertraline (ZOLOFT) 100 MG tablet Take 1 tablet (100 mg total) by mouth every evening. 90 tablet 1 12/01/2016 at Unknown time  . tamsulosin (FLOMAX) 0.4 MG CAPS capsule TAKE 1 CAPSULE (0.4 MG TOTAL) DAILY AFTER BREAKFAST. (Patient taking differently: Take 0.4 mg by mouth daily after breakfast. TAKE 1 CAPSULE (0.4 MG TOTAL) DAILY AFTER BREAKFAST.) 90 capsule 1 12/02/2016 at Unknown time  . torsemide (DEMADEX) 20 MG tablet Take 20 mg by mouth 2 (two) times daily.   12/02/2016 at Unknown time  . traZODone (DESYREL) 50 MG tablet Take 25-50 mg by mouth as directed. Take 50mg  every night at bedtime. May take 25mg  up to twice daily as needed for anxiety   Past Week at Unknown time  . warfarin (COUMADIN) 5 MG tablet Take 5-7.5 mg by mouth as directed. Take 5mg  Monday-Friday. Take 7.5mg  Saturday and Sunday.   12/01/2016 at Unknown time    Assessment: 76 yo M from SNF on coumadin for Afib. INR 1.54 is subtherapeutic on admission.  Patient had  not been receiving prescribed dose PTA which likely explains low level.    12/04/2016:  INR  2.09-->therapeutic, up from 1.61  Hg low but stable, Pltc WNL  No bleeding noted  Na restricted diet ordered  Goal of Therapy:  INR 2-3   Plan:  Coumadin 5 mg po x 1 today Daily INR Monitor for signs and symptoms of bleeding    Royetta Asal, PharmD, BCPS Pager [phone] 12/04/2016 9:04 AM

## 2016-12-04 NOTE — Progress Notes (Addendum)
Pt refusing to keep tele monitor on despite multiple attempts from staff. CCMD notified. NP on call, Schorr, notified and advised to document pt's noncompliance. Will continue to monitor.

## 2016-12-04 NOTE — Progress Notes (Addendum)
[facility]                                                                              Patient Demographics  Mark [name], is a 76 y.o. male, DOB - June 07, 1940, ZDG:[id number]  Admit date - 12/02/2016   Admitting Physician Toy Baker, MD  Outpatient Primary MD for the patient is [name], NP  Outpatient specialists:   LOS - 2  days    Chief Complaint  Patient presents with  . Shortness of Breath       Brief summary  Mark [name] is a 76 y.o. male with medical history significant for but not limited to Alzheimer's dementia, CHF with Systolic dysfunction, CAD, AAA, atrial fibrillation on chronic Coumadin, and COPD admitted for respiratory failure with acute on chronic CHF after an episode of agitation and wanting to run away from SNF.   Assessment & Plan    Active Problems:   Hyperlipidemia   Depression   Essential hypertension   AAA (abdominal aortic aneurysm) (HCC)   Atrial fibrillation, chronic (HCC)   Acute systolic CHF (congestive heart failure) (HCC)   Alzheimer's dementia with behavioral disturbance   Elevated troponin   Acute combined systolic and diastolic CHF, NYHA class 1 (HCC)   1 Acute systolic  CHF: Daily weights; 93---92--91 Input and output monitoring; negative 827 Diuresis with monitoring of electrolytes and renal function 2-D echocardiogram pending Continue with IV lasix 40 mg BID>  Cardiology following.  Monitor renal function while on lasix.   2 elevated troponin: History of CK D No evidence of ACS Cardiology consult appreciated - outpatient Myoview after acute episode resolves  3 atrial fibrillation:  CHA2DS2 5 Continue Coumadin anticoagulation Rate control HR drop to 20 and pauses per staff. I will hold coreg. Nurse to inform cardiology   4 Alzheimer's dementia with behavioral disturbance: Psychotropic/psychoactive medications Supportive care Continue sitter for now Psychiatry consulted resume  home meds; Seroquel, trazodone.   Code Status: Full code DVT Prophylaxis:  On Coumadin Family Communication:    Disposition Plan: SNF  Time Spent in minutes   35 minutes  Procedures:    Consultants:   Cardiology-Dr. Johnsie Cancel  Antimicrobials:      Medications  Scheduled Meds: . atorvastatin  40 mg Oral q1800  . carvedilol  12.5 mg Oral BID AC  . chlorhexidine  15 mL Mouth Rinse BID  . digoxin  0.0625 mg Oral q morning - 10a  . famotidine  10 mg Oral BID  . furosemide  40 mg Intravenous Q12H  . isosorbide dinitrate  10 mg Oral TID  . mouth rinse  15 mL Mouth Rinse q12n4p  . pantoprazole  40 mg Oral BID  . potassium chloride  20 mEq Oral Once per day on Mon Wed Fri   And  . potassium chloride  40 mEq Oral Once per day on Sun Tue Thu Sat  . QUEtiapine  50 mg Oral BID  . sertraline  100 mg Oral QPM  . sodium chloride flush  3 mL Intravenous Q12H  . tamsulosin  0.4 mg Oral QPC breakfast  . traZODone  50 mg Oral QHS  .  warfarin  5 mg Oral ONCE-1800  . Warfarin - Pharmacist Dosing Inpatient   Does not apply q1800   Continuous Infusions: . sodium chloride     PRN Meds:.sodium chloride, acetaminophen **OR** acetaminophen, HYDROcodone-acetaminophen, labetalol, levalbuterol, ondansetron **OR** ondansetron (ZOFRAN) IV, polyethylene glycol, sodium chloride flush, traZODone   Antibiotics   Anti-infectives    None        Subjective:   Mark [name] was seen and examined today.  He is alert, he denies chest pain. He feels congested. He does report episode of chest pain a week back.    Objective:   Vitals:   12/04/16 0615 12/04/16 0804 12/04/16 0949 12/04/16 1002  BP: (!) 156/100 124/88 (!) 138/100   Pulse: 82 71 61 89  Resp: 20     Temp: (!) 97 F (36.1 C)     TempSrc: Axillary     SpO2: 98%     Weight: 91 kg (200 lb 9.9 oz)     Height:        Intake/Output Summary (Last 24 hours) at 12/04/16 1037 Last data filed at 12/04/16 2725  Gross per 24 hour    Intake             1320 ml  Output             1550 ml  Net             -230 ml     Wt Readings from Last 3 Encounters:  12/04/16 91 kg (200 lb 9.9 oz)  11/27/16 94.3 kg (208 lb)  11/23/16 93.9 kg (207 lb)     Exam  General: NAD, pleasantly confuse.   Neck: supple,   Cardiovascular: S 1, S 2 RRR  Respiratory: respiratory effort normal, crackles bilateral.   Gastrointestinal: Soft , NT, ND  Ext: (-) no edema/   Neuro: alert, , confuse. Follows command.   Skin: no rash.    Data Reviewed:  I have personally reviewed following labs and imaging studies  Micro Results Recent Results (from the past 240 hour(s))  MRSA PCR Screening     Status: None   Collection Time: 12/02/16 10:21 PM  Result Value Ref Range Status   MRSA by PCR NEGATIVE NEGATIVE Final    Comment:        The GeneXpert MRSA Assay (FDA approved for NASAL specimens only), is one component of a comprehensive MRSA colonization surveillance program. It is not intended to diagnose MRSA infection nor to guide or monitor treatment for MRSA infections.     Radiology Reports Dg Chest 2 View  Result Date: 12/02/2016 CLINICAL DATA:  Shortness of breath.  History of CHF. EXAM: CHEST  2 VIEW COMPARISON:  Chest radiograph 10/17/2016 FINDINGS: Monitoring leads overlie the patient. Stable cardiomegaly. Pulmonary vascular redistribution and mild interstitial opacities bilaterally. Small bilateral pleural effusions. No pneumothorax. Thoracic spine degenerative changes. Suspect skin fold projecting over the lower right hemithorax. IMPRESSION: Cardiomegaly, pulmonary vascular redistribution and mild interstitial edema. Small bilateral pleural effusions. Electronically Signed   By: Lovey Newcomer M.D.   On: 12/02/2016 16:54    Lab Data:  CBC:  Recent Labs Lab 12/02/16 1651 12/03/16 0127 12/04/16 0626  WBC 4.4 4.9 3.5*  HGB 8.8* 8.5* 8.7*  HCT 28.2* 27.5* 28.3*  MCV 86.5 86.5 86.5  PLT 196 193 366   Basic  Metabolic Panel:  Recent Labs Lab 12/02/16 1651 12/03/16 0127 12/03/16 1106 12/04/16 0626  NA 143 143 143 144  K 3.5 3.5  3.4* 3.8  CL 106 107 108 107  CO2 26 25 26 27   GLUCOSE 100* 134* 102* 112*  BUN 30* 31* 29* 30*  CREATININE 2.37* 2.25* 2.19* 2.24*  CALCIUM 8.7* 8.7* 8.6* 8.7*  MG  --  2.1  --   --   PHOS  --  4.0  --   --    GFR: Estimated Creatinine Clearance: 32.2 mL/min (A) (by C-G formula based on SCr of 2.24 mg/dL (H)). Liver Function Tests:  Recent Labs Lab 12/02/16 1651 12/03/16 0127  AST 22 24  ALT 8* 10*  ALKPHOS 87 79  BILITOT 1.4* 1.7*  PROT 6.6 6.2*  ALBUMIN 3.4* 3.2*   No results for input(s): LIPASE, AMYLASE in the last 168 hours. No results for input(s): AMMONIA in the last 168 hours. Coagulation Profile:  Recent Labs Lab 12/02/16 1651 12/03/16 0127 12/04/16 0626  INR 1.54 1.61 2.09   Cardiac Enzymes:  Recent Labs Lab 12/03/16 0127 12/03/16 0713 12/03/16 1106 12/03/16 1613 12/03/16 2310  TROPONINI 0.07* 0.09* 0.14* 0.23* 0.21*   BNP (last 3 results) No results for input(s): PROBNP in the last 8760 hours. HbA1C: No results for input(s): HGBA1C in the last 72 hours. CBG: No results for input(s): GLUCAP in the last 168 hours. Lipid Profile: No results for input(s): CHOL, HDL, LDLCALC, TRIG, CHOLHDL, LDLDIRECT in the last 72 hours. Thyroid Function Tests:  Recent Labs  12/03/16 0127  TSH 5.016*   Anemia Panel: No results for input(s): VITAMINB12, FOLATE, FERRITIN, TIBC, IRON, RETICCTPCT in the last 72 hours. Urine analysis:    Component Value Date/Time   COLORURINE YELLOW 12/02/2016 1946   APPEARANCEUR CLEAR 12/02/2016 1946   LABSPEC 1.006 12/02/2016 1946   PHURINE 7.0 12/02/2016 1946   GLUCOSEU NEGATIVE 12/02/2016 1946   GLUCOSEU NEGATIVE 07/17/2014 0945   HGBUR NEGATIVE 12/02/2016 1946   BILIRUBINUR NEGATIVE 12/02/2016 [place] NEGATIVE 12/02/2016 1946   PROTEINUR NEGATIVE 12/02/2016 1946   UROBILINOGEN  0.2 07/17/2014 0945   NITRITE NEGATIVE 12/02/2016 1946   LEUKOCYTESUR NEGATIVE 12/02/2016 1946     Geno Sydnor A M.D. [facility] 12/04/2016, 10:37 AM  Pager: [phone]  Between 7am to 7pm - call Pager - [phone]  After 7pm go to [email or web address] - password [email or web address]  Call night coverage person covering after 7pm

## 2016-12-04 NOTE — Progress Notes (Addendum)
bp down to 160/101 2 hours after first dose of Labetolol.   HR remains in 80s-90s.  Second dose of iv Latelolol given.  Will continue to monitor.  MN troponi decreasing to .21

## 2016-12-04 NOTE — Consult Note (Signed)
[facility] Consult   Reason for Consult:  Dementia with behavioral problems  Referring Physician:  Dr. Tyrell Antonio Patient Identification: Mark [name] MRN:  [id number] Principal Diagnosis: <principal problem not specified> Diagnosis:   Patient Active Problem List   Diagnosis Date Noted  . Elevated troponin [R74.8] 12/02/2016  . Acute combined systolic and diastolic CHF, NYHA class 1 ([place]) [I50.41] 12/02/2016  . Pulmonary edema [J81.1]   . GI bleed [K92.2] 10/11/2016  . Subacute delirium [F05] 10/07/2016  . Cerebrovascular accident (CVA) ([place]) [I63.9]   . AKI (acute kidney injury) ([place]) [N17.9] 10/06/2016  . Alzheimer's dementia with behavioral disturbance [G30.9, F02.81]   . Hallucinations [R44.3]   . LBBB (left bundle branch block) [I44.7]   . Acute systolic CHF (congestive heart failure) ([place]) [I50.21] 08/16/2016  . Acute encephalopathy [G93.40] 08/14/2016  . Acute on chronic respiratory failure with hypoxia ([place]) [J96.21] 08/14/2016  . Asthma [J45.909] 03/24/2016  . Preventative health care [Z00.00] 09/22/2015  . Osteopenia [M85.80] 09/07/2014  . Osteoarthritis, hand [M19.049] 04/09/2014  . Normocytic anemia [D64.9] 04/09/2014  . OSA (obstructive sleep apnea) [G47.33] 01/08/2014  . Daytime somnolence [R40.0] 11/20/2013  . Renal cell carcinoma ([place]) [C64.9] 11/06/2013  . AAA (abdominal aortic aneurysm) without rupture ([place]) [I71.4] 09/22/2013  . Atrial fibrillation, chronic ([place]) [I48.2] 07/25/2012  . ED (erectile dysfunction) of organic origin [N52.9] 05/11/2011  . BPH (benign prostatic hyperplasia) [N40.0] 12/21/2009  . AAA (abdominal aortic aneurysm) ([place]) [I71.4] 05/28/2009  . Disorder resulting from impaired renal function [N25.9] 05/28/2009  . Hyperlipidemia [E78.5] 11/11/2008  . Depression [F32.9] 11/11/2008  . Essential hypertension [I10] 11/11/2008  . Coronary atherosclerosis [I25.10] 11/11/2008  . GERD [K21.9] 11/11/2008  . SKIN CANCER, HX OF  [W09.811] 11/11/2008  . NEPHROLITHIASIS, HX OF [Z87.442] 11/11/2008    Total Time spent with patient: 45 minutes  Subjective:   Mark [name] is a 77 y.o. male patient admitted with dementia with behavioral problems.  HPI:  Mark [name] is a 76 y.o. male, seen, chart reviewed and case discussed with CSW for this face-to-face psychiatric consultation and evaluation. Patient appeared sitting on his bed, awake, alert, oriented to place and person. [facility] is a [facility], brought into the hospital by EMS for running away from the placement, and unable to breathe well. Patient is a poor historian he has cognitive deficits significant for dementia and reportedly paranoid about people's with  Intentions. Patient is able to tell me his first name, last name, date of birth and keep on getting confused about questions related to orientation, finding immediate memory but poor delayed recall memory poor object identification and makes most of the empty talk without stuff. Patient has a limited understanding about his mental health status and also medical conditions and does not know how to get treatment he deserves. Patient does not appear to be responding to internal stimuli, he has no auditory/visual hallucinations. The EKG showing QTC prolongation so reduce his Seroquel to 25 mg twice daily and monitor frequent EKG  Medical history significant of Alzheimer's dementia, HLD, HTN, CAD, AAA, atrial fibrillation on chronic Coumadin, asthma, chronic respiratory failure with hypoxia, systolic CHF, history of GI bleeding in June 2018, CKD baseline creatinine 2.0, COPD history of renal cell carcinoma status post nephrectomy   Past Psychiatric History:  Patient has no previous acute psychiatric hospitalization.   Risk to Self: Is patient at risk for suicide?: No Risk to Others:   Prior Inpatient Therapy:   Prior Outpatient Therapy:  Past Medical History:  Past Medical History:  Diagnosis Date  .  AAA (abdominal aortic aneurysm) (HCC)    9.1 cm repair 05/2009 ( ENDOVASCULAR REPAIR ).  MORE RECENT RE-EVALUATION OF ENLARGING AAA BY DR. Trula Slade AND BY DR. Kathlene Cote  MAY 2015 - PT STATES HE UNDERSTANDS THAT NOTHING IS LEAKING AT PRESENT TIME AND HE IS TO FOLLOW UP WITH HIS DOCTORS FOR FUTURE MONITORING I  . Arthritis    LEFT HAND, RT KNEE (08/14/2016)  . Basal cell carcinoma    "right side of my nose; think they burned it off" (08/14/2016)  . CAD (coronary artery disease)    a. s/p PCI to LAD/diagonal, subtotal of PDA 2006. b. Negative nuc 2014.  Marland Kitchen Chronic atrial fibrillation ([place])   . Chronic systolic CHF (congestive heart failure) (HCC)    a. EF 20-25% dx in 08/2016; etiology not yet clear.  . CKD (chronic kidney disease), stage III    a. stage III-IV, Cr baseline appears 1.8-2.0  . COPD (chronic obstructive pulmonary disease) ([place])   . Depression   . GERD (gastroesophageal reflux disease)   . History of kidney stones   . History of pneumothorax    LONG TIME AGO  . Hyperlipidemia   . Hypertension   . Migraine    "very rare the last 20 years" (08/14/2016)  . Myocardial infarction ([place]) 2006  . Osteoarthritis   . Renal cell carcinoma ([place])   . Shingles outbreak    PT NOTICED SKIN RASH RT GROIN AND RIGHT TRUNK ON MONDAY 10/27/13 - NOT A LOT OF DISCOMFORT- NOT ON ANY ORAL MEDS TO TREAT.  . Sleep apnea    "dx'd; didn't try mask" (08/14/2016)  . Stroke [facility])    "CT showed I've had old strokes; never knew about it before today" (08/14/2016)    Past Surgical History:  Procedure Laterality Date  . ABDOMINAL AORTAGRAM N/A 09/24/2013   Procedure: ABDOMINAL Maxcine Ham;  Surgeon: Serafina Mitchell, MD;  Location: [facility] CATH LAB;  Service: Cardiovascular;  Laterality: N/A;  . ABDOMINAL AORTIC ANEURYSM REPAIR  JAN 2011   ENDOVASCULAR - DR. BRABHAM  . CARDIAC CATHETERIZATION  07/04/2004   Archie Endo 07/04/2004  . COLONOSCOPY  latest 2004   Dr Sammuel Cooper.  reported in 11/11/2008 PMD note as normal   . CORONARY  ANGIOPLASTY WITH STENT PLACEMENT  07/05/2004   notes 07/05/2004  . ESOPHAGOGASTRODUODENOSCOPY N/A 10/12/2016   Procedure: ESOPHAGOGASTRODUODENOSCOPY (EGD);  Surgeon: Doran Stabler, MD;  Location: [facility] ENDOSCOPY;  Service: Endoscopy;  Laterality: N/A;  bedside procedure in [facility]  . KNEE ARTHROSCOPY Right   . LITHOTRIPSY    . ROBOT ASSISTED LAPAROSCOPIC NEPHRECTOMY Left 11/06/2013   Procedure: ROBOTIC ASSISTED LAPAROSCOPIC RADICAL  NEPHRECTOMY;  Surgeon: Alexis Frock, MD;  Location: [facility] ORS;  Service: Urology;  Laterality: Left;  . TONSILLECTOMY  1964   Family History:  Family History  Problem Relation Age of Onset  . Pulmonary embolism Mother        died of PE  . Heart disease Mother        before age 13  . Anuerysm Father        history of popliteal  . Coronary artery disease Unknown   . Hyperlipidemia Unknown   . Hypertension Unknown   . Prostate cancer Unknown   . Other Neg Hx        polycystic kidney disease, and colon cancer  . Colon cancer Neg Hx    Family Psychiatric  History: Unknown  Social History:  History  Alcohol Use  . 1.5 oz/week  . 3 Standard drinks or equivalent per week    Comment: 08/14/2016 "maybe 2-3 drinks/month average"     History  Drug Use No    Social History   Social History  . Marital status: Divorced    Spouse name: N/A  . Number of children: 2  . Years of education: N/A   Occupational History  .  Retired   Social History Main Topics  . Smoking status: Former Smoker    Packs/day: 1.50    Years: 23.00    Types: Cigarettes    Quit date: 08/24/1980  . Smokeless tobacco: Never Used  . Alcohol use 1.5 oz/week    3 Standard drinks or equivalent per week     Comment: 08/14/2016 "maybe 2-3 drinks/month average"  . Drug use: No  . Sexual activity: Not Asked   Other Topics Concern  . None   Social History Narrative   Retired   Divorced   2 sons 12 &30     Occupation:  Part time sports broadcaster    Smoking Status:  quit  (08/24/1980)   Packs/Day:  1.0    Caffeine use/day:  3 beverages daily    Does Patient Exercise:  no   Alcohol Use - yes   Additional Social History:    Allergies:   Allergies  Allergen Reactions  . Ace Inhibitors Cough    Labs:  Results for orders placed or performed during the hospital encounter of 12/02/16 (from the past 48 hour(s))  CBC     Status: Abnormal   Collection Time: 12/02/16  4:51 PM  Result Value Ref Range   WBC 4.4 4.0 - 10.5 K/uL   RBC 3.26 (L) 4.22 - 5.81 MIL/uL   Hemoglobin 8.8 (L) 13.0 - 17.0 g/dL   HCT 28.2 (L) 39.0 - 52.0 %   MCV 86.5 78.0 - 100.0 fL   MCH 27.0 26.0 - 34.0 pg   MCHC 31.2 30.0 - 36.0 g/dL   RDW 18.9 (H) 11.5 - 15.5 %   Platelets 196 150 - 400 K/uL  Comprehensive metabolic panel     Status: Abnormal   Collection Time: 12/02/16  4:51 PM  Result Value Ref Range   Sodium 143 135 - 145 mmol/L   Potassium 3.5 3.5 - 5.1 mmol/L   Chloride 106 101 - 111 mmol/L   CO2 26 22 - 32 mmol/L   Glucose, Bld 100 (H) 65 - 99 mg/dL   BUN 30 (H) 6 - 20 mg/dL   Creatinine, Ser 2.37 (H) 0.61 - 1.24 mg/dL   Calcium 8.7 (L) 8.9 - 10.3 mg/dL   Total Protein 6.6 6.5 - 8.1 g/dL   Albumin 3.4 (L) 3.5 - 5.0 g/dL   AST 22 15 - 41 U/L   ALT 8 (L) 17 - 63 U/L   Alkaline Phosphatase 87 38 - 126 U/L   Total Bilirubin 1.4 (H) 0.3 - 1.2 mg/dL   GFR calc non Af Amer 26 (L) >60 mL/min   GFR calc Af Amer 30 (L) >60 mL/min    Comment: (NOTE) The eGFR has been calculated using the CKD EPI equation. This calculation has not been validated in all clinical situations. eGFR's persistently <60 mL/min signify possible Chronic Kidney Disease.    Anion gap 11 5 - 15  Brain natriuretic peptide     Status: Abnormal   Collection Time: 12/02/16  4:51 PM  Result Value Ref Range  B Natriuretic Peptide 2,238.5 (H) 0.0 - 100.0 pg/mL  Protime-INR     Status: Abnormal   Collection Time: 12/02/16  4:51 PM  Result Value Ref Range   Prothrombin Time 18.7 (H) 11.4 - 15.2 seconds    INR 1.54   I-Stat Troponin, ED (not at [facility])     Status: Abnormal   Collection Time: 12/02/16  4:55 PM  Result Value Ref Range   Troponin i, poc 0.10 (HH) 0.00 - 0.08 ng/mL   Comment NOTIFIED PHYSICIAN    Comment 3            Comment: Due to the release kinetics of cTnI, a negative result within the first hours of the onset of symptoms does not rule out myocardial infarction with certainty. If myocardial infarction is still suspected, repeat the test at appropriate intervals.   I-Stat CG4 Lactic Acid, ED     Status: None   Collection Time: 12/02/16  4:57 PM  Result Value Ref Range   Lactic Acid, Venous 0.99 0.5 - 1.9 mmol/L  Urinalysis, Routine w reflex microscopic     Status: None   Collection Time: 12/02/16  7:46 PM  Result Value Ref Range   Color, Urine YELLOW YELLOW   APPearance CLEAR CLEAR   Specific Gravity, Urine 1.006 1.005 - 1.030   pH 7.0 5.0 - 8.0   Glucose, UA NEGATIVE NEGATIVE mg/dL   Hgb urine dipstick NEGATIVE NEGATIVE   Bilirubin Urine NEGATIVE NEGATIVE   Ketones, ur NEGATIVE NEGATIVE mg/dL   Protein, ur NEGATIVE NEGATIVE mg/dL   Nitrite NEGATIVE NEGATIVE   Leukocytes, UA NEGATIVE NEGATIVE  Troponin I (q 6hr x 3)     Status: Abnormal   Collection Time: 12/02/16  9:00 PM  Result Value Ref Range   Troponin I 0.08 (HH) <0.03 ng/mL    Comment: CRITICAL RESULT CALLED TO, READ BACK BY AND VERIFIED WITH: Alanson Aly AT 2139 ON 07.28.2018 BY NBROOKS   Digoxin level     Status: Abnormal   Collection Time: 12/02/16  9:00 PM  Result Value Ref Range   Digoxin Level 0.4 (L) 0.8 - 2.0 ng/mL  MRSA PCR Screening     Status: None   Collection Time: 12/02/16 10:21 PM  Result Value Ref Range   MRSA by PCR NEGATIVE NEGATIVE    Comment:        The GeneXpert MRSA Assay (FDA approved for NASAL specimens only), is one component of a comprehensive MRSA colonization surveillance program. It is not intended to diagnose MRSA infection nor to guide or monitor treatment  for MRSA infections.   Troponin I (q 6hr x 3)     Status: Abnormal   Collection Time: 12/03/16  1:27 AM  Result Value Ref Range   Troponin I 0.07 (HH) <0.03 ng/mL    Comment: CRITICAL VALUE NOTED.  VALUE IS CONSISTENT WITH PREVIOUSLY REPORTED AND CALLED VALUE.  Magnesium     Status: None   Collection Time: 12/03/16  1:27 AM  Result Value Ref Range   Magnesium 2.1 1.7 - 2.4 mg/dL  Phosphorus     Status: None   Collection Time: 12/03/16  1:27 AM  Result Value Ref Range   Phosphorus 4.0 2.5 - 4.6 mg/dL  TSH     Status: Abnormal   Collection Time: 12/03/16  1:27 AM  Result Value Ref Range   TSH 5.016 (H) 0.350 - 4.500 uIU/mL    Comment: Performed by a 3rd Generation assay with a functional sensitivity of <=0.01  uIU/mL.  Comprehensive metabolic panel     Status: Abnormal   Collection Time: 12/03/16  1:27 AM  Result Value Ref Range   Sodium 143 135 - 145 mmol/L   Potassium 3.5 3.5 - 5.1 mmol/L   Chloride 107 101 - 111 mmol/L   CO2 25 22 - 32 mmol/L   Glucose, Bld 134 (H) 65 - 99 mg/dL   BUN 31 (H) 6 - 20 mg/dL   Creatinine, Ser 2.25 (H) 0.61 - 1.24 mg/dL   Calcium 8.7 (L) 8.9 - 10.3 mg/dL   Total Protein 6.2 (L) 6.5 - 8.1 g/dL   Albumin 3.2 (L) 3.5 - 5.0 g/dL   AST 24 15 - 41 U/L   ALT 10 (L) 17 - 63 U/L   Alkaline Phosphatase 79 38 - 126 U/L   Total Bilirubin 1.7 (H) 0.3 - 1.2 mg/dL   GFR calc non Af Amer 27 (L) >60 mL/min   GFR calc Af Amer 32 (L) >60 mL/min    Comment: (NOTE) The eGFR has been calculated using the CKD EPI equation. This calculation has not been validated in all clinical situations. eGFR's persistently <60 mL/min signify possible Chronic Kidney Disease.    Anion gap 11 5 - 15  CBC     Status: Abnormal   Collection Time: 12/03/16  1:27 AM  Result Value Ref Range   WBC 4.9 4.0 - 10.5 K/uL   RBC 3.18 (L) 4.22 - 5.81 MIL/uL   Hemoglobin 8.5 (L) 13.0 - 17.0 g/dL   HCT 27.5 (L) 39.0 - 52.0 %   MCV 86.5 78.0 - 100.0 fL   MCH 26.7 26.0 - 34.0 pg   MCHC  30.9 30.0 - 36.0 g/dL   RDW 18.8 (H) 11.5 - 15.5 %   Platelets 193 150 - 400 K/uL  Protime-INR     Status: Abnormal   Collection Time: 12/03/16  1:27 AM  Result Value Ref Range   Prothrombin Time 19.3 (H) 11.4 - 15.2 seconds   INR 1.61   Troponin I (q 6hr x 3)     Status: Abnormal   Collection Time: 12/03/16  7:13 AM  Result Value Ref Range   Troponin I 0.09 (HH) <0.03 ng/mL    Comment: CRITICAL RESULT CALLED TO, READ BACK BY AND VERIFIED WITH: BLACKWELL,S AT 8:45AM ON 12/03/16 BY FESTERMAN,C   Troponin I (q 6hr x 3)     Status: Abnormal   Collection Time: 12/03/16 11:06 AM  Result Value Ref Range   Troponin I 0.14 (HH) <0.03 ng/mL    Comment: CRITICAL RESULT CALLED TO, READ BACK BY AND VERIFIED WITH: BLACKWELL,S AT 11:45AM ON 12/03/16 BY [facility]   Basic metabolic panel     Status: Abnormal   Collection Time: 12/03/16 11:06 AM  Result Value Ref Range   Sodium 143 135 - 145 mmol/L   Potassium 3.4 (L) 3.5 - 5.1 mmol/L   Chloride 108 101 - 111 mmol/L   CO2 26 22 - 32 mmol/L   Glucose, Bld 102 (H) 65 - 99 mg/dL   BUN 29 (H) 6 - 20 mg/dL   Creatinine, Ser 2.19 (H) 0.61 - 1.24 mg/dL   Calcium 8.6 (L) 8.9 - 10.3 mg/dL   GFR calc non Af Amer 28 (L) >60 mL/min   GFR calc Af Amer 33 (L) >60 mL/min    Comment: (NOTE) The eGFR has been calculated using the CKD EPI equation. This calculation has not been validated in all clinical situations. eGFR's persistently <60  mL/min signify possible Chronic Kidney Disease.    Anion gap 9 5 - 15  Troponin I (q 6hr x 3)     Status: Abnormal   Collection Time: 12/03/16  4:13 PM  Result Value Ref Range   Troponin I 0.23 (HH) <0.03 ng/mL    Comment: CRITICAL RESULT CALLED TO, READ BACK BY AND VERIFIED WITH: S.BLACKWELL AT 1707 ON 12/03/16 BY N.THOMPSON   Troponin I (q 6hr x 3)     Status: Abnormal   Collection Time: 12/03/16 11:10 PM  Result Value Ref Range   Troponin I 0.21 (HH) <0.03 ng/mL    Comment: CRITICAL VALUE NOTED.  VALUE IS  CONSISTENT WITH PREVIOUSLY REPORTED AND CALLED VALUE.  Basic metabolic panel     Status: Abnormal   Collection Time: 12/04/16  6:26 AM  Result Value Ref Range   Sodium 144 135 - 145 mmol/L   Potassium 3.8 3.5 - 5.1 mmol/L   Chloride 107 101 - 111 mmol/L   CO2 27 22 - 32 mmol/L   Glucose, Bld 112 (H) 65 - 99 mg/dL   BUN 30 (H) 6 - 20 mg/dL   Creatinine, Ser 2.24 (H) 0.61 - 1.24 mg/dL   Calcium 8.7 (L) 8.9 - 10.3 mg/dL   GFR calc non Af Amer 27 (L) >60 mL/min   GFR calc Af Amer 32 (L) >60 mL/min    Comment: (NOTE) The eGFR has been calculated using the CKD EPI equation. This calculation has not been validated in all clinical situations. eGFR's persistently <60 mL/min signify possible Chronic Kidney Disease.    Anion gap 10 5 - 15  Protime-INR     Status: Abnormal   Collection Time: 12/04/16  6:26 AM  Result Value Ref Range   Prothrombin Time 23.8 (H) 11.4 - 15.2 seconds   INR 2.09   Brain natriuretic peptide     Status: Abnormal   Collection Time: 12/04/16  6:26 AM  Result Value Ref Range   B Natriuretic Peptide 3,613.9 (H) 0.0 - 100.0 pg/mL  CBC     Status: Abnormal   Collection Time: 12/04/16  6:26 AM  Result Value Ref Range   WBC 3.5 (L) 4.0 - 10.5 K/uL   RBC 3.27 (L) 4.22 - 5.81 MIL/uL   Hemoglobin 8.7 (L) 13.0 - 17.0 g/dL   HCT 28.3 (L) 39.0 - 52.0 %   MCV 86.5 78.0 - 100.0 fL   MCH 26.6 26.0 - 34.0 pg   MCHC 30.7 30.0 - 36.0 g/dL   RDW 18.7 (H) 11.5 - 15.5 %   Platelets 178 150 - 400 K/uL    Current Facility-Administered Medications  Medication Dose Route Frequency Provider Last Rate Last Dose  . 0.9 %  sodium chloride infusion  250 mL Intravenous PRN Toy Baker, MD      . acetaminophen (TYLENOL) tablet 650 mg  650 mg Oral Q6H PRN Doutova, Anastassia, MD       Or  . acetaminophen (TYLENOL) suppository 650 mg  650 mg Rectal Q6H PRN Doutova, Anastassia, MD      . atorvastatin (LIPITOR) tablet 40 mg  40 mg Oral q1800 Doutova, Anastassia, MD   40 mg at  12/03/16 1821  . carvedilol (COREG) tablet 12.5 mg  12.5 mg Oral BID AC Doutova, Anastassia, MD   12.5 mg at 12/04/16 0804  . chlorhexidine (PERIDEX) 0.12 % solution 15 mL  15 mL Mouth Rinse BID Doutova, Anastassia, MD   15 mL at 12/04/16 1002  . digoxin (  LANOXIN) tablet 0.0625 mg  0.0625 mg Oral q morning - 10a Doutova, Anastassia, MD   0.0625 mg at 12/04/16 1002  . famotidine (PEPCID) tablet 10 mg  10 mg Oral BID Toy Baker, MD   10 mg at 12/04/16 1002  . furosemide (LASIX) injection 40 mg  40 mg Intravenous Q12H Doutova, Anastassia, MD   40 mg at 12/04/16 1003  . HYDROcodone-acetaminophen (NORCO/VICODIN) 5-325 MG per tablet 1-2 tablet  1-2 tablet Oral Q4H PRN Toy Baker, MD   2 tablet at 12/04/16 0054  . isosorbide dinitrate (ISORDIL) tablet 10 mg  10 mg Oral TID Toy Baker, MD   10 mg at 12/04/16 1002  . labetalol (NORMODYNE,TRANDATE) injection 5 mg  5 mg Intravenous Q2H PRN Rise Patience, MD   5 mg at 12/04/16 0050  . levalbuterol (XOPENEX) nebulizer solution 0.63 mg  0.63 mg Nebulization Q6H PRN Toy Baker, MD   0.63 mg at 12/03/16 2228  . MEDLINE mouth rinse  15 mL Mouth Rinse q12n4p Doutova, Anastassia, MD   15 mL at 12/03/16 1217  . ondansetron (ZOFRAN) tablet 4 mg  4 mg Oral Q6H PRN Doutova, Anastassia, MD       Or  . ondansetron (ZOFRAN) injection 4 mg  4 mg Intravenous Q6H PRN Doutova, Anastassia, MD      . pantoprazole (PROTONIX) EC tablet 40 mg  40 mg Oral BID Doutova, Anastassia, MD   40 mg at 12/04/16 1002  . polyethylene glycol (MIRALAX / GLYCOLAX) packet 17 g  17 g Oral Daily PRN Doutova, Anastassia, MD      . QUEtiapine (SEROQUEL) tablet 50 mg  50 mg Oral BID Doutova, Anastassia, MD   50 mg at 12/04/16 1002  . sertraline (ZOLOFT) tablet 100 mg  100 mg Oral QPM Doutova, Anastassia, MD   100 mg at 12/03/16 1821  . sodium chloride flush (NS) 0.9 % injection 3 mL  3 mL Intravenous Q12H Doutova, Anastassia, MD   3 mL at 12/04/16 1013  .  sodium chloride flush (NS) 0.9 % injection 3 mL  3 mL Intravenous PRN Doutova, Anastassia, MD      . tamsulosin (FLOMAX) capsule 0.4 mg  0.4 mg Oral QPC breakfast Doutova, Anastassia, MD   0.4 mg at 12/04/16 0804  . traZODone (DESYREL) tablet 25 mg  25 mg Oral BID PRN Toy Baker, MD   25 mg at 12/04/16 0048  . traZODone (DESYREL) tablet 50 mg  50 mg Oral QHS Toy Baker, MD   50 mg at 12/03/16 2213  . warfarin (COUMADIN) tablet 5 mg  5 mg Oral ONCE-1800 Glogovac, Nikola, RPH      . Warfarin - Pharmacist Dosing Inpatient   Does not apply [id number] Toy Baker, MD        Musculoskeletal: Strength & Muscle Tone: decreased Gait & Station: unable to stand Patient leans: N/A  Psychiatric Specialty Exam: Physical Exam as per history and physical   ROS denied nausea, vomiting, abdominal pain, chest pain endorses paranoid delusions and confusion. Patient has a limited cognitive functions during this evaluation.  No Fever-chills, No Headache, No changes with Vision or hearing, reports vertigo No problems swallowing food or Liquids, No Chest pain, Cough or Shortness of Breath, No Abdominal pain, No Nausea or Vommitting, Bowel movements are regular, No Blood in stool or Urine, No dysuria, No new skin rashes or bruises, No new joints pains-aches,  No new weakness, tingling, numbness in any extremity, No recent weight gain or loss, No polyuria,  polydypsia or polyphagia,  A full 10 point Review of Systems was done, except as stated above, all other Review of Systems were negative.  Blood pressure (!) 138/100, pulse 89, temperature (!) 97 F (36.1 C), temperature source Axillary, resp. rate 20, height 6' (1.829 m), weight 91 kg (200 lb 9.9 oz), SpO2 98 %.Body mass index is 27.21 kg/m.  General Appearance: Disheveled and Guarded  Eye Contact:  Good  Speech:  Clear and Coherent and Slow  Volume:  Decreased  Mood:  Depressed  Affect:  Constricted and Depressed  Thought  Process:  Coherent  Orientation:  Full (Time, Place, and Person)  Thought Content:  Paranoid Ideation and Rumination  Suicidal Thoughts:  No  Homicidal Thoughts:  No  Memory:  Immediate;   Fair Recent;   Poor Remote;   Poor  Judgement:  Impaired  Insight:  Shallow  Psychomotor Activity:  Decreased  Concentration:  Concentration: Fair and Attention Span: Poor  Recall:  Poor  Fund of Knowledge:  Fair  Language:  Good  Akathisia:  Negative  Handed:  Right  AIMS (if indicated):     Assets:  Communication Skills Desire for Improvement Financial Resources/Insurance Housing Leisure Time Social Support Transportation  ADL's:  Impaired  Cognition:  Impaired,  Moderate  Sleep:        Treatment Plan Summary: 76 years old male with multiple medical problems and dementia presented with behavioral problems like wandering away from the basement and also shortness of breath. Patient is a poor historian and has moderate cognitive deficits.  He does not have capacity to make his own medical issues are living arrangements.   Medication recommendation: Trazodone 50 mg at bedtime and then 25 mg as needed for insomnia Continue Zoloft 100 mg daily evening for mood Decrease Seroquel to 25 mg twice daily for agitation and paranoia associated with dementia Monitor for EKG for QTC prolongation  Avoid benzodiazepines and opiates which made increase his confusion and memory deficits CSW will contact staff at [facility] for collateral information  Patient benefit from skilled nursing facility with memory unit as he has no capacity and trying to walk away from facility.   Disposition: No evidence of imminent risk to self or others at present.   Supportive therapy provided about ongoing stressors.  Ambrose Finland, MD 12/04/2016 11:28 AM

## 2016-12-04 NOTE — Clinical Social Work Psych Note (Signed)
Clinical Social Worker Psych Service Line Progress Note  Clinical Social Worker: Lia Hopping, LCSW Date/Time: 12/04/2016, 3:31 PM   Review of Patient  Overall Medical Condition: Admitted for shortness of Breathe. Patient presented with increased agitation. 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   Participation Level:  Active Participation Quality: Appropriate, Other - See Comments (Dementia) Other Participation Quality:  Calm and Cooperative   Affect: Appropriate Cognitive:  (Dementia) Reaction to Medications/Concerns:  Chronically taking Seroquel for agitation.   Modes of Intervention: Support, Solution-focused   Summary of Progress/Plan at Discharge  Summary of Progress/Plan at Discharge: CSW and psychiatrist met with patient at bedside. Patient oriented to person and pleasantly confused.  Unit CSW in contact with patient ex-wife POA. She is understands and has been working with Scientist, research (life sciences) to place patient in Longterm memory care unit. The facilty does not feel safe with patient return due to safety risk.  Per Psychiatrist note patient is No evidence of imminent risk to self or others at present. Supportive therapy provided about ongoing stressors. CSW psychiatric Service Line signing off.   CSW spoke with Psychiatrist about IVC. CSW instructed to rescind IVC. Physician signed. Faxed to [employer]. Copy on patient chart.  RN notified.   Kathrin Greathouse, Latanya Presser, MSW Clinical Social Worker 5E and Psychiatric Service Line [phone] 12/04/2016  4:07 PM

## 2016-12-04 NOTE — Progress Notes (Signed)
  Echocardiogram 2D Echocardiogram with definity  has been performed.  Zaynab Chipman L Androw 12/04/2016, 3:36 PM

## 2016-12-04 NOTE — Clinical Social Work Note (Signed)
Clinical Social Work Assessment  Patient Details  Name: Mark [name] MRN: [id number] Date of Birth: 06-Jun-1940  Date of referral:  12/04/16               Reason for consult:  Facility Placement                Permission sought to share information with:  Facility Art therapist granted to share information::  Yes, Verbal Permission Granted  Name::        Agency::     Relationship::     Contact Information:     Housing/Transportation Living arrangements for the past 2 months:  [place] of Information:  Adult Children, Other (Comment Required) (Son (Mark [name] [phone]) Ex-Wife Mark [name] [phone])) Patient Interpreter Needed:  None Criminal Activity/Legal Involvement Pertinent to Current Situation/Hospitalization:  No - Comment as needed Significant Relationships:  Adult Children, Other(Comment) (Ex-wife) Lives with:  Facility Resident Do you feel safe going back to the place where you live?   (Psychiatrist recommending a SNF with memory care unit) Need for family participation in patient care:  Yes (Comment)  Care giving concerns:  Patient from [facility]. Patient recently started wandering and tried to escape SNF. Psychiatrist recommending SNF with memory care unit. PT recommending SNF for ST rehab.   Social Worker assessment / plan:   CSW spoke with patient's son Mark [name] [phone]), patient's son reported that they applied for Medicaid and trying to find a [name] term care placement for patient. Patient's son reported that they are waiting to hear back from a few facilities and advised CSW to contact patient's ex-wife Mark [name] [phone]) to get more details and assistance with discharge planning.  CSW contacted patient's ex-wife Mark [name] [phone]), to discuss discharge planning. Patient's ex-wife reported that [facility] SNF was assisting her with finding patient Mark [name] term care placement, no  placement had been found. She reported that patient's Medicaid application was done on July 6th and is pending. She reported that patient's confusion is progressing and that the wandering is new. CSW informed patient's wife that psychiatrist was recommending SNF with memory care unit due to patient's wandering, patient's ex-wife agreeable. CSW and patient's ex-wife discussed insurance authorization, patient's ex-wife reported that patient is not able to private pay for SNF noting that the patient gets approx 1600 SSI and 200 from retirement fund monthly.  CSW will complete FL2 and send to SNFs with memory care units. CSW will start insurance authorization process and inquire about ability to discontinue patient's sitter.  Employment status:  Retired Nurse, adult PT Recommendations:  [place] / Referral to community resources:  [place] ([place] [facility])  Patient/Family's Response to care:  Patient's ex-wife appreciative of assistance from [name]. Patient's wife agreeable to SNF with memory care unit.   Patient/Family's Understanding of and Emotional Response to Diagnosis, Current Treatment, and Prognosis:  Patient's ex-wife expressed that patient's decline in health has been overwhelming, CSW provided emotional support. Patient's ex-wife involved in patient's care and verbalized understanding of patient's diagnosis and plan to discharge patient to SNF with memory care unit.   Emotional Assessment Appearance:    Attitude/Demeanor/Rapport:  Unable to Assess Affect (typically observed):  Unable to Assess Orientation:  Oriented to Self Alcohol / Substance use:  Not Applicable Psych involvement (Current and /or in the community):  Yes (Comment)  Discharge Needs  Concerns to be addressed:  No discharge needs identified  Readmission within the last 30 days:  No Current discharge risk:  None Barriers to Discharge:  Requiring  sitter/restraints   Mark Medin, LCSW 12/04/2016, 2:37 PM

## 2016-12-04 NOTE — Progress Notes (Signed)
Progress Note  Patient Name: Mark [name] Date of Encounter: 12/04/2016  Primary Cardiologist: Stanford Breed  Subjective   76 yo with hx of dementia, CAD, PAF, HTN,  Chronic systolic CHF ( EF 32-67%) AAA repair with residual endoleak a Admitted with increased shortness of breath   Denies any chest pain  He still slightly short of breath although he states that he is breathing better than he was on admission. Ins and outs are -827 cc so far this admission.    Inpatient Medications    Scheduled Meds: . atorvastatin  40 mg Oral q1800  . carvedilol  12.5 mg Oral BID AC  . chlorhexidine  15 mL Mouth Rinse BID  . digoxin  0.0625 mg Oral q morning - 10a  . famotidine  10 mg Oral BID  . furosemide  40 mg Intravenous Q12H  . isosorbide dinitrate  10 mg Oral TID  . mouth rinse  15 mL Mouth Rinse q12n4p  . pantoprazole  40 mg Oral BID  . potassium chloride  20 mEq Oral Once per day on Mon Wed Fri   And  . potassium chloride  40 mEq Oral Once per day on Sun Tue Thu Sat  . QUEtiapine  50 mg Oral BID  . sertraline  100 mg Oral QPM  . sodium chloride flush  3 mL Intravenous Q12H  . tamsulosin  0.4 mg Oral QPC breakfast  . traZODone  50 mg Oral QHS  . Warfarin - Pharmacist Dosing Inpatient   Does not apply q1800   Continuous Infusions: . sodium chloride     PRN Meds: sodium chloride, acetaminophen **OR** acetaminophen, HYDROcodone-acetaminophen, labetalol, levalbuterol, ondansetron **OR** ondansetron (ZOFRAN) IV, polyethylene glycol, sodium chloride flush, traZODone   Vital Signs    Vitals:   12/04/16 0037 12/04/16 0257 12/04/16 0615 12/04/16 0804  BP: (!) 160/101 (!) 144/100 (!) 156/100 124/88  Pulse: 88 79 82 71  Resp: 20  20   Temp:   (!) 97 F (36.1 C)   TempSrc:   Axillary   SpO2: 95%  98%   Weight:   200 lb 9.9 oz (91 kg)   Height:        Intake/Output Summary (Last 24 hours) at 12/04/16 0827 Last data filed at 12/04/16 0812  Gross per 24 hour  Intake              1320 ml  Output             1550 ml  Net             -230 ml   Filed Weights   12/02/16 2204 12/03/16 0614 12/04/16 0615  Weight: 205 lb 0.4 oz (93 kg) 204 lb 12.9 oz (92.9 kg) 200 lb 9.9 oz (91 kg)    Telemetry    Atrial fib  - Personally Reviewed  ECG     Atrial fib  , Rate is well-controlled.  -  Personally Reviewed  Physical Exam   GEN: Elderly gentleman, no acute distress.   Neck: No JVD Cardiac: Irreg. Irreg. , no murmurs, rubs, or gallops.  Respiratory:  he has a few bilateral rales.Marland Kitchen GI: Soft, nontender, non-distended  MS: No edema; No deformity. Neuro:  Nonfocal  Psych: Normal affect   Labs    Chemistry Recent Labs Lab 12/02/16 1651 12/03/16 0127 12/03/16 1106 12/04/16 0626  NA 143 143 143 144  K 3.5 3.5 3.4* 3.8  CL 106 107 108 107  CO2 26 25 26  27  GLUCOSE 100* 134* 102* 112*  BUN 30* 31* 29* 30*  CREATININE 2.37* 2.25* 2.19* 2.24*  CALCIUM 8.7* 8.7* 8.6* 8.7*  PROT 6.6 6.2*  --   --   ALBUMIN 3.4* 3.2*  --   --   AST 22 24  --   --   ALT 8* 10*  --   --   ALKPHOS 87 79  --   --   BILITOT 1.4* 1.7*  --   --   GFRNONAA 26* 27* 28* 27*  GFRAA 30* 32* 33* 32*  ANIONGAP 11 11 9 10      Hematology Recent Labs Lab 12/02/16 1651 12/03/16 0127 12/04/16 0626  WBC 4.4 4.9 3.5*  RBC 3.26* 3.18* 3.27*  HGB 8.8* 8.5* 8.7*  HCT 28.2* 27.5* 28.3*  MCV 86.5 86.5 86.5  MCH 27.0 26.7 26.6  MCHC 31.2 30.9 30.7  RDW 18.9* 18.8* 18.7*  PLT 196 193 178    Cardiac Enzymes Recent Labs Lab 12/03/16 0713 12/03/16 1106 12/03/16 1613 12/03/16 2310  TROPONINI 0.09* 0.14* 0.23* 0.21*    Recent Labs Lab 12/02/16 1655  TROPIPOC 0.10*     BNP Recent Labs Lab 12/02/16 1651 12/04/16 0626  BNP 2,238.5* 3,613.9*     DDimer No results for input(s): DDIMER in the last 168 hours.   Radiology    Dg Chest 2 View  Result Date: 12/02/2016 CLINICAL DATA:  Shortness of breath.  History of CHF. EXAM: CHEST  2 VIEW COMPARISON:  Chest radiograph  10/17/2016 FINDINGS: Monitoring leads overlie the patient. Stable cardiomegaly. Pulmonary vascular redistribution and mild interstitial opacities bilaterally. Small bilateral pleural effusions. No pneumothorax. Thoracic spine degenerative changes. Suspect skin fold projecting over the lower right hemithorax. IMPRESSION: Cardiomegaly, pulmonary vascular redistribution and mild interstitial edema. Small bilateral pleural effusions. Electronically Signed   By: Lovey Newcomer M.D.   On: 12/02/2016 16:54    Cardiac Studies     Patient Profile     76 y.o. male admitted with worsening congestive heart failure and minimally elevated troponin levels. He has diuresed and is feeling better.  Assessment & Plan    1. Elevated troponin level: He has known coronary artery disease. He denies any chest pain. His troponin levels are trending down. It is quite possible that his mildly elevated troponin level is due to exacerbation of CHF. We will consider an outpatient Myoview study. Continue home medications.  2. Atrial fibrillation:  He's on Coumadin. His INR this morning is 2.09. Rate is well-controlled.  3. Chronic kidney disease: His INR this morning is 2.24. Further plans and management per the internal medicine team.  4. Chronic systolic congestive heart failure: He has no edema.  He still has a few rales in his lungs. It should be noted that in the presence of chronic kidney disease, the B natruretic peptide will always be elevated and is not necessarily reliable. Echocardiogram is pending today.  Signed, Mertie Moores, MD  12/04/2016, 8:27 AM

## 2016-12-04 NOTE — Progress Notes (Signed)
CSW contacted patient's son to complete assessment, no answer. CSW left voicemail requesting return phone call. Per chart review patient only oriented to person, CSW attempted to speak with patient and patient was asleep. CSW will continue to try and make contact with patient's son to discuss discharge planning.   Abundio Miu, [place] Social Worker [facility] Cell#: [phone]

## 2016-12-04 NOTE — Progress Notes (Signed)
Patient's heart rate low at times when patient is sleeping, lowest 29 nonsustained. Dr. Tyrell Antonio notified, med changed and Cardiology [place]-NP notified as well. Will continue to assess patient.

## 2016-12-04 NOTE — Progress Notes (Signed)
Initial Nutrition Assessment  DOCUMENTATION CODES:   Non-severe (moderate) malnutrition in context of chronic illness  INTERVENTION:   Provide Ensure Enlive po BID, each supplement provides 350 kcal and 20 grams of protein RD will continue to monitor for needs  NUTRITION DIAGNOSIS:   Malnutrition (Moderate) related to chronic illness (dementia) as evidenced by percent weight loss, mild depletion of body fat.  GOAL:   Patient will meet greater than or equal to 90% of their needs  MONITOR:   PO intake, Supplement acceptance, Labs, Weight trends, I & O's  REASON FOR ASSESSMENT:   Consult Assessment of nutrition requirement/status  ASSESSMENT:   76 y.o. male with medical history significant for but not limited to Alzheimer's dementia, CHF with Systolic dysfunction, CAD, AAA, atrial fibrillation on chronic Coumadin, and COPD admitted for respiratory failure with acute on chronic CHF after an episode of agitation and wanting to run away from SNF.  Patient in room with no family at bedside. Pt with dementia and unable to provide history. Pt currently with lunch tray containing pot roast. Staff had set tray up for patient. Pt ate 100% of his breakfast this morning of eggs, sausage, toast and blueberry muffin. Per chart review, pt receives Ensure supplements at facility PTA. Will order for patient.  Pt's UBW is 220 lb. Pt has lost 12 lb since 6/20 (6% wt loss x 1 month, significant for time frame). Nutrition-Focused physical exam completed. Findings are mild fat depletion, no muscle depletion, and mild edema.   Labs reviewed. Medications: IV Lasix every 12 hours, Protonix tablet BID  Diet Order:  Diet 2 gram sodium Room service appropriate? Yes; Fluid consistency: Thin  Skin:  Reviewed, no issues  Last BM:  7/29  Height:   Ht Readings from Last 1 Encounters:  12/02/16 6' (1.829 m)    Weight:   Wt Readings from Last 1 Encounters:  12/04/16 200 lb 9.9 oz (91 kg)    Ideal  Body Weight:  80.9 kg  BMI:  Body mass index is 27.21 kg/m.  Estimated Nutritional Needs:   Kcal:  2050-2250  Protein:  95-105g  Fluid:  2L/day  EDUCATION NEEDS:   No education needs identified at this time  Clayton Bibles, MS, RD, LDN Pager: [phone] After Hours Pager: [phone]

## 2016-12-05 LAB — PROTIME-INR
INR: 3.25
PROTHROMBIN TIME: 33.9 s — AB (ref 11.4–15.2)

## 2016-12-05 LAB — CBC
HCT: 27.9 % — ABNORMAL LOW (ref 39.0–52.0)
HEMOGLOBIN: 8.5 g/dL — AB (ref 13.0–17.0)
MCH: 26.8 pg (ref 26.0–34.0)
MCHC: 30.5 g/dL (ref 30.0–36.0)
MCV: 88 fL (ref 78.0–100.0)
Platelets: 201 10*3/uL (ref 150–400)
RBC: 3.17 MIL/uL — ABNORMAL LOW (ref 4.22–5.81)
RDW: 19 % — ABNORMAL HIGH (ref 11.5–15.5)
WBC: 5.2 10*3/uL (ref 4.0–10.5)

## 2016-12-05 LAB — BASIC METABOLIC PANEL
Anion gap: 9 (ref 5–15)
BUN: 33 mg/dL — AB (ref 6–20)
CHLORIDE: 105 mmol/L (ref 101–111)
CO2: 29 mmol/L (ref 22–32)
Calcium: 8.7 mg/dL — ABNORMAL LOW (ref 8.9–10.3)
Creatinine, Ser: 2.19 mg/dL — ABNORMAL HIGH (ref 0.61–1.24)
GFR calc non Af Amer: 28 mL/min — ABNORMAL LOW (ref >60)
GFR, EST AFRICAN AMERICAN: 33 mL/min — AB (ref >60)
Glucose, Bld: 118 mg/dL — ABNORMAL HIGH (ref 65–99)
POTASSIUM: 3.7 mmol/L (ref 3.5–5.1)
SODIUM: 143 mmol/L (ref 135–145)

## 2016-12-05 MED ORDER — LORAZEPAM 2 MG/ML IJ SOLN
1.0000 mg | Freq: Once | INTRAMUSCULAR | Status: AC
  Administered 2016-12-05: 1 mg via INTRAVENOUS
  Filled 2016-12-05: qty 1

## 2016-12-05 NOTE — Care Management Note (Signed)
Case Management Note  Patient Details  Name: Mark [name] MRN: [id number] Date of Birth: Nov 19, 1940  Subjective/Objective:  76 y/o m admitted w/elevated troponin. Hx: CAD,psychosis. From SNF-[facility]. Psych-was ivc but has been rescinded-SNF w/memory care. Cardio-afib w/ rvr tachy/brady,meds adjusted. CSW following for SNF w/memory care unit.                  Action/Plan:d/c SNF   Expected Discharge Date:                  Expected Discharge Plan:  [place]  In-House Referral:  Clinical Social Work  Discharge planning Services  CM Consult  Post Acute Care Choice:    Choice offered to:     DME Arranged:    DME Agency:     HH Arranged:    [place] Agency:     Status of Service:  In process, will continue to follow  If discussed at Long Length of Stay Meetings, dates discussed:    Additional Comments:  Dessa Phi, RN 12/05/2016, 10:40 AM

## 2016-12-05 NOTE — Progress Notes (Signed)
[place] for coumadin Indication: atrial fibrillation  Allergies  Allergen Reactions  . Ace Inhibitors Cough    Patient Measurements: Height: 6' (182.9 cm) Weight: 202 lb 2.6 oz (91.7 kg) IBW/kg (Calculated) : 77.6   Vital Signs: Temp: 97.6 F (36.4 C) (07/31 0632) Temp Source: Axillary (07/31 4132) BP: 137/104 (07/31 4401) Pulse Rate: 100 (07/31 0632)  Labs:  Recent Labs  12/03/16 0127  12/03/16 1106 12/03/16 1613 12/03/16 2310 12/04/16 0626 12/05/16 0454  HGB 8.5*  --   --   --   --  8.7* 8.5*  HCT 27.5*  --   --   --   --  28.3* 27.9*  PLT 193  --   --   --   --  178 201  LABPROT 19.3*  --   --   --   --  23.8* 33.9*  INR 1.61  --   --   --   --  2.09 3.25  CREATININE 2.25*  --  2.19*  --   --  2.24* 2.19*  TROPONINI 0.07*  < > 0.14* 0.23* 0.21*  --   --   < > = values in this interval not displayed.  Estimated Creatinine Clearance: 33 mL/min (A) (by C-G formula based on SCr of 2.19 mg/dL (H)).   Medical History: Past Medical History:  Diagnosis Date  . AAA (abdominal aortic aneurysm) (HCC)    9.1 cm repair 05/2009 ( ENDOVASCULAR REPAIR ).  MORE RECENT RE-EVALUATION OF ENLARGING AAA BY DR. Trula Slade AND BY DR. Kathlene Cote  MAY 2015 - PT STATES HE UNDERSTANDS THAT NOTHING IS LEAKING AT PRESENT TIME AND HE IS TO FOLLOW UP WITH HIS DOCTORS FOR FUTURE MONITORING I  . Arthritis    LEFT HAND, RT KNEE (08/14/2016)  . Basal cell carcinoma    "right side of my nose; think they burned it off" (08/14/2016)  . CAD (coronary artery disease)    a. s/p PCI to LAD/diagonal, subtotal of PDA 2006. b. Negative nuc 2014.  Marland Kitchen Chronic atrial fibrillation ([place])   . Chronic systolic CHF (congestive heart failure) (HCC)    a. EF 20-25% dx in 08/2016; etiology not yet clear.  . CKD (chronic kidney disease), stage III    a. stage III-IV, Cr baseline appears 1.8-2.0  . COPD (chronic obstructive pulmonary disease) ([place])   . Depression   . GERD  (gastroesophageal reflux disease)   . History of kidney stones   . History of pneumothorax    LONG TIME AGO  . Hyperlipidemia   . Hypertension   . Migraine    "very rare the last 20 years" (08/14/2016)  . Myocardial infarction ([place]) 2006  . Osteoarthritis   . Renal cell carcinoma ([place])   . Shingles outbreak    PT NOTICED SKIN RASH RT GROIN AND RIGHT TRUNK ON MONDAY 10/27/13 - NOT A LOT OF DISCOMFORT- NOT ON ANY ORAL MEDS TO TREAT.  . Sleep apnea    "dx'd; didn't try mask" (08/14/2016)  . Stroke [facility])    "CT showed I've had old strokes; never knew about it before today" (08/14/2016)    Medications:  Prescriptions Prior to Admission  Medication Sig Dispense Refill Last Dose  . albuterol (PROVENTIL HFA;VENTOLIN HFA) 108 (90 Base) MCG/ACT inhaler Inhale 2 puffs into the lungs every 6 (six) hours as needed for wheezing or shortness of breath. 1 Inhaler 0 Past Month at Unknown time  . atorvastatin (LIPITOR) 40 MG tablet TAKE 1 TABLET  ONE TIME DAILY (Patient taking differently: Take 40 mg by mouth daily at 6 PM. TAKE 1 TABLET ONE TIME DAILY) 90 tablet 1 12/01/2016 at Unknown time  . carvedilol (COREG) 12.5 MG tablet Take 12.5 mg by mouth 2 (two) times daily.   12/02/2016 at 1700  . Coenzyme Q10 200 MG capsule Take 200 mg by mouth daily.   12/02/2016 at Unknown time  . digoxin 62.5 MCG TABS Please take 0.125 mg oral daily for 1 days, then change to 0.0625 mg oral daily on 6/23. (Patient taking differently: Take 0.0625 mg by mouth daily. Please take 0.125 mg oral daily for 1 days, then change to 0.0625 mg oral daily on 6/23.)   12/02/2016 at Unknown time  . isosorbide dinitrate (ISORDIL) 10 MG tablet Take 1 tablet (10 mg total) by mouth 3 (three) times daily.   12/02/2016 at 1500  . levalbuterol (XOPENEX) 0.63 MG/3ML nebulizer solution Take 3 mLs (0.63 mg total) by nebulization every 6 (six) hours as needed for wheezing or shortness of breath. 3 mL 12 Past Month at Unknown time  . Multiple Vitamins-Minerals  (MACUVITE EYE CARE PO) Take 1 capsule by mouth daily.   12/02/2016 at Unknown time  . Omega-3 Fatty Acids (FISH OIL) 1200 MG CAPS Take 1,200 mg by mouth daily.    12/02/2016 at Unknown time  . OXYGEN Inhale 3 L/min into the lungs continuous.   12/02/2016 at Unknown time  . pantoprazole (PROTONIX) 40 MG tablet Take 1 tablet (40 mg total) by mouth 2 (two) times daily.   12/02/2016 at Unknown time  . polyethylene glycol (MIRALAX / GLYCOLAX) packet Take 17 g by mouth daily as needed. 14 each 0 Past Month at Unknown time  . potassium chloride SA (K-DUR,KLOR-CON) 20 MEQ tablet Take 20-40 mEq by mouth as directed. Take 1 tablet to = 20 mEq M-W-F, take 2 tablets to = 40 mEq Tue-Thu-Sat-Sun    12/02/2016 at Unknown time  . QUEtiapine (SEROQUEL) 50 MG tablet Take 50 mg by mouth 2 (two) times daily.   12/02/2016 at Unknown time  . ranitidine (ZANTAC) 150 MG tablet Take 1 tablet (150 mg total) by mouth 2 (two) times daily. 180 tablet 1 12/02/2016 at Unknown time  . sertraline (ZOLOFT) 100 MG tablet Take 1 tablet (100 mg total) by mouth every evening. 90 tablet 1 12/01/2016 at Unknown time  . tamsulosin (FLOMAX) 0.4 MG CAPS capsule TAKE 1 CAPSULE (0.4 MG TOTAL) DAILY AFTER BREAKFAST. (Patient taking differently: Take 0.4 mg by mouth daily after breakfast. TAKE 1 CAPSULE (0.4 MG TOTAL) DAILY AFTER BREAKFAST.) 90 capsule 1 12/02/2016 at Unknown time  . torsemide (DEMADEX) 20 MG tablet Take 20 mg by mouth 2 (two) times daily.   12/02/2016 at Unknown time  . traZODone (DESYREL) 50 MG tablet Take 25-50 mg by mouth as directed. Take 50mg  every night at bedtime. May take 25mg  up to twice daily as needed for anxiety   Past Week at Unknown time  . warfarin (COUMADIN) 5 MG tablet Take 5-7.5 mg by mouth as directed. Take 5mg  Monday-Friday. Take 7.5mg  Saturday and Sunday.   12/01/2016 at Unknown time    Assessment: 76 yo M from SNF on coumadin for Afib. INR 1.54 is subtherapeutic on admission.  Patient had not been receiving prescribed  dose PTA which likely explains low level.    Note: warfarin at SNF was transcribed incorrectly and pt was not getting it as intended. Pt was supposed to be on warfarin 5 mg Mon-Fr  and 7.5 mg Sat/Sun but transcription error had pt on 5 mg Mon- Fri and nothing Sat/Sun. No dose given today.  SNF not sure how long error in place  12/05/2016:  INR rising quickly and now supratherapeutic after inpatient warfarin doses of 7.5mg , 7.5mg , and 5mg   Hg low but stable, Pltc WNL  No bleeding noted  Goal of Therapy:  INR 2-3   Plan:  1) No warfarin today 2) Daily INR 3) Monitor for signs and symptoms of bleeding   Adrian Saran, PharmD, BCPS Pager [phone] 12/05/2016 7:39 AM

## 2016-12-05 NOTE — Progress Notes (Signed)
Pharmacist Heart Failure Core Measure Documentation  Assessment: Mark [name] has an EF documented as 25-30% on 12/04/2016 by ECHO.  Rationale: Heart failure patients with left ventricular systolic dysfunction (LVSD) and an EF < 40% should be prescribed an angiotensin converting enzyme inhibitor (ACEI) or angiotensin receptor blocker (ARB) at discharge unless a contraindication is documented in the medical record.  This patient is not currently on an ACEI or ARB for HF.  This note is being placed in the record in order to provide documentation that a contraindication to the use of these agents is present for this encounter.  ACE Inhibitor or Angiotensin Receptor Blocker is contraindicated (specify all that apply)  []   ACEI allergy AND ARB allergy []   Angioedema []   Moderate or severe aortic stenosis []   Hyperkalemia []   Hypotension []   Renal artery stenosis [x]   Worsening renal function, preexisting renal disease or dysfunction   Mark [name] 12/05/2016 12:24 PM

## 2016-12-05 NOTE — Progress Notes (Signed)
Progress Note  Patient Name: Mark [name] Date of Encounter: 12/05/2016  Primary Cardiologist: Dr. Stanford Breed  Subjective   Dozing, no chest pain, was confused and restless during the night and did not allow tele.     Inpatient Medications    Scheduled Meds: . atorvastatin  40 mg Oral q1800  . chlorhexidine  15 mL Mouth Rinse BID  . digoxin  0.0625 mg Oral q morning - 10a  . famotidine  10 mg Oral BID  . feeding supplement (ENSURE ENLIVE)  237 mL Oral BID BM  . furosemide  40 mg Intravenous Q12H  . isosorbide dinitrate  10 mg Oral TID  . mouth rinse  15 mL Mouth Rinse q12n4p  . pantoprazole  40 mg Oral BID  . QUEtiapine  25 mg Oral BID  . sertraline  100 mg Oral QPM  . sodium chloride flush  3 mL Intravenous Q12H  . tamsulosin  0.4 mg Oral QPC breakfast  . traZODone  50 mg Oral QHS  . Warfarin - Pharmacist Dosing Inpatient   Does not apply q1800   Continuous Infusions: . sodium chloride     PRN Meds: sodium chloride, acetaminophen **OR** acetaminophen, HYDROcodone-acetaminophen, labetalol, levalbuterol, ondansetron **OR** ondansetron (ZOFRAN) IV, polyethylene glycol, sodium chloride flush, traZODone   Vital Signs    Vitals:   12/04/16 1002 12/04/16 1414 12/04/16 2051 12/05/16 0632  BP:  (!) 138/95 (!) 153/85 (!) 137/104  Pulse: 89 71 79 100  Resp:  20 20 20   Temp:  (!) 97.5 F (36.4 C) (!) 97.5 F (36.4 C) 97.6 F (36.4 C)  TempSrc:  Axillary Axillary Axillary  SpO2:  97% 95% 96%  Weight:    202 lb 2.6 oz (91.7 kg)  Height:        Intake/Output Summary (Last 24 hours) at 12/05/16 0903 Last data filed at 12/05/16 0600  Gross per 24 hour  Intake             2003 ml  Output             1375 ml  Net              628 ml   Filed Weights   12/03/16 0614 12/04/16 0615 12/05/16 7622  Weight: 204 lb 12.9 oz (92.9 kg) 200 lb 9.9 oz (91 kg) 202 lb 2.6 oz (91.7 kg)    Telemetry    HR to 29 per RN At times tachycardic at 120 other times HR to 30 has had 3 sec  pauses.  - Personally Reviewed  ECG    No new - Personally Reviewed  Physical Exam   GEN: No acute distress.   Neck: + JVD Cardiac: irreg irreg, + murmurs best heard Lt lateral, 4th IC space, no rubs, or gallops.  Respiratory: Clear to auscultation bilaterally, from ant position no wheezes or rales. GI: Soft, nontender, non-distended  MS: No edema; No deformity. Neuro:  Nonfocal  Psych: Normal affect -pleasant currently , confused.  Labs    Chemistry Recent Labs Lab 12/02/16 1651 12/03/16 0127 12/03/16 1106 12/04/16 0626 12/05/16 0454  NA 143 143 143 144 143  K 3.5 3.5 3.4* 3.8 3.7  CL 106 107 108 107 105  CO2 26 25 26 27 29   GLUCOSE 100* 134* 102* 112* 118*  BUN 30* 31* 29* 30* 33*  CREATININE 2.37* 2.25* 2.19* 2.24* 2.19*  CALCIUM 8.7* 8.7* 8.6* 8.7* 8.7*  PROT 6.6 6.2*  --   --   --  ALBUMIN 3.4* 3.2*  --   --   --   AST 22 24  --   --   --   ALT 8* 10*  --   --   --   ALKPHOS 87 79  --   --   --   BILITOT 1.4* 1.7*  --   --   --   GFRNONAA 26* 27* 28* 27* 28*  GFRAA 30* 32* 33* 32* 33*  ANIONGAP 11 11 9 10 9      Hematology Recent Labs Lab 12/03/16 0127 12/04/16 0626 12/05/16 0454  WBC 4.9 3.5* 5.2  RBC 3.18* 3.27* 3.17*  HGB 8.5* 8.7* 8.5*  HCT 27.5* 28.3* 27.9*  MCV 86.5 86.5 88.0  MCH 26.7 26.6 26.8  MCHC 30.9 30.7 30.5  RDW 18.8* 18.7* 19.0*  PLT 193 178 201    Cardiac Enzymes Recent Labs Lab 12/03/16 0713 12/03/16 1106 12/03/16 1613 12/03/16 2310  TROPONINI 0.09* 0.14* 0.23* 0.21*    Recent Labs Lab 12/02/16 1655  TROPIPOC 0.10*     BNP Recent Labs Lab 12/02/16 1651 12/04/16 0626  BNP 2,238.5* 3,613.9*     DDimer No results for input(s): DDIMER in the last 168 hours.   Radiology    No results found.  Cardiac Studies   Echo 12/04/16 Study Conclusions  - Left ventricle: The cavity size was normal. There was moderate   concentric hypertrophy. Systolic function was severely reduced.   The estimated ejection  fraction was in the range of 25% to 30%.   There is hypokinesis of the anteroseptal myocardium. - Ventricular septum: Septal motion showed abnormal function and   dyssynergy. - Aortic valve: There was mild regurgitation. - Aorta: Aortic root dimension: 42 mm (ED). - Ascending aorta: The ascending aorta was mildly dilated. - Mitral valve: Calcified annulus. There was moderate   regurgitation. - Left atrium: The atrium was moderately dilated. - Right atrium: The atrium was severely dilated. - Pericardium, extracardiac: There was a left pleural effusion.  Impressions:  - Compared to the prior study, there has been no significant   interval change.  Patient Profile     76 y.o. male with hx of dementia, CAD, Permanent AF, HTN,  Chronic systolic CHF ( EF 16-10%) AAA repair with residual endoleak, LBBB.  Admitted with increased shortness of breath - CHF and minimally elevated troponin.     Assessment & Plan    Permanent a fib, RVR on admit with acute CHF, now with slower rate at times and pauses of 3 sec.  Coreg has been stopped. CHA2DS2VASc is 5.  Pt with hx of sleep apnea.  Will check dig level in AM   Anticoagulation was on coumadin and is now resumed INR today 3.26   TachyBrady  BB on hold, pt has been restless at night and confused, not wearing monitor.  Sleep apnea doubt he will wear CPAP with confusion  Cardiomyopathy/acute  Systolic HF.  EF continues 25 to 30%.  I&O neg 399 wt down 3 lbs.    MR moderate no changes.  Elevated troponin pk troponin 0.23 "He has known coronary artery disease. He denies any chest pain. His troponin levels are trending down. It is quite possible that his mildly elevated troponin level is due to exacerbation of CHF. We will consider an outpatient Myoview study. Continue home medications."  nuc has been ordered but not done.  Anemia with Hgb 8.5  Hypothyroid with TSH 5.016 per IM  Dilated aortic root at 42 mm  CT has been ordered but not done as  outpt   AAA  CT in June Re- demonstration of surgically treated infrarenal abdominal aortic aneurysm with stent graft. Greatest diameter of the excluded aneurysm sac is relatively unchanged compared to most recent CT studies, approximately 9.1 cm.  Prior Lt nephrectomy  CKD 3-4   Signed, Cecilie Kicks, NP  12/05/2016, 9:03 AM     Attending Note:   The patient was seen and examined.  Agree with assessment and plan as noted above.  Changes made to the above note as needed.  Patient seen and independently examined with Cecilie Kicks, NP.   We discussed all aspects of the encounter. I agree with the assessment and plan as stated above.  1.  Atrial fib: HR is currently well controlled.  Agree with holding coreg for now. INR is 3.25  2. Chronic systolic CHF:   EF 62-56% - basically unchanged.  Has CKD.  Unable to start ARB. Had to stop coreg due to puases.     I have spent a total of 40 minutes with patient reviewing hospital  notes , telemetry, EKGs, labs and examining patient as well as establishing an assessment and plan that was discussed with the patient. > 50% of time was spent in direct patient care.    Thayer Headings, Brooke Bonito., MD, [facility] 12/05/2016, 1:20 PM [address]  [place] Pager [phone]

## 2016-12-05 NOTE — Progress Notes (Addendum)
Per chart review, patient placed in medical restraints. Patient will need to be out of restraints for at least 24 hours before he able to be placed at SNF. CSW will inform patient's RN.  Patient's RN reported that patient has not been placed in restraints. She reported that there is an order but patient has not been placed in restraints. CSW will contact patient's ex-wife and provide bed offers and follow up with patient's insurance authorization.   Abundio Miu, [facility]  Clinical Social Worker [phone]

## 2016-12-05 NOTE — Clinical Social Work Placement (Signed)
Patient received and accepted bed offer at [facility]. CSW attempted to follow up [facility] about patient's insurance authorization, CSW unable to complete insurance authorization. CSW notified [facility] who agreed to initiate insurance authorization.   CLINICAL SOCIAL WORK PLACEMENT  NOTE  Date:  12/05/2016  Patient Details  Name: Mark [name] MRN: [id number] Date of Birth: 07-31-1940  Clinical Social Work is seeking post-discharge placement for this patient at the [place] level of care (*CSW will initial, date and re-position this form in  chart as items are completed):  Yes   Patient/family provided with [place] Work Department's list of facilities offering this level of care within the geographic area requested by the patient (or if unable, by the patient's family).  Yes   Patient/family informed of their freedom to choose among providers that offer the needed level of care, that participate in Medicare, Medicaid or managed care program needed by the patient, have an available bed and are willing to accept the patient.  Yes   Patient/family informed of [facility]'s ownership interest in [facility] and [facility], as well as of the fact that they are under no obligation to receive care at these facilities.  PASRR submitted to EDS on       PASRR number received on       Existing PASRR number confirmed on 12/04/16     FL2 transmitted to all facilities in geographic area requested by pt/family on 12/04/16     FL2 transmitted to all facilities within larger geographic area on       Patient informed that his/her managed care company has contracts with or will negotiate with certain facilities, including the following:        Yes   Patient/family informed of bed offers received.  Patient chooses bed at [facility]     Physician recommends and patient chooses bed at      Patient to be transferred to   on   .  Patient to be transferred to facility by       Patient family notified on   of transfer.  Name of family member notified:        PHYSICIAN       Additional Comment:    _______________________________________________ Burnis Medin, LCSW 12/05/2016, 1:45 PM

## 2016-12-05 NOTE — Progress Notes (Signed)
[facility]                                                                              Patient Demographics  Mark [name], is a 76 y.o. male, DOB - [birth year], KZS:[id number]  Admit date - 12/02/2016   Admitting Physician Toy Baker, MD  Outpatient Primary MD for the patient is [name], NP  Outpatient specialists:   LOS - 3  days    Chief Complaint  Patient presents with  . Shortness of Breath       Brief summary  Mark [name] is a 76 y.o. male with medical history significant for but not limited to Alzheimer's dementia, CHF with Systolic dysfunction, CAD, AAA, atrial fibrillation on chronic Coumadin, and COPD admitted for respiratory failure with acute on chronic CHF after an episode of agitation and wanting to run away from SNF.   Assessment & Plan    Active Problems:   Hyperlipidemia   Depression   Essential hypertension   AAA (abdominal aortic aneurysm) (HCC)   Atrial fibrillation, chronic (HCC)   Acute systolic CHF (congestive heart failure) (HCC)   Alzheimer's dementia with behavioral disturbance   Elevated troponin   Acute combined systolic and diastolic CHF, NYHA class 1 (HCC)   1 Acute systolic  CHF: Daily weights noted.  Input and output monitoring;   Diuresis with monitoring of electrolytes and renal function 2-D echocardiogram noted: EF 25-30%, no significant interval change.  Continue with IV lasix 40 mg BID>  Cardiology following.  Monitor renal function while on lasix.   2 elevated troponin: History of CK D No evidence of ACS Cardiology consult appreciated - outpatient Myoview after acute episode resolves  3 atrial fibrillation:  CHA2DS2 5 Continue Coumadin anticoagulation Rate control:  now with slower rate at times and pauses of 3 sec.  Coreg has been stopped. CHA2DS2VASc is 5.  Pt with hx of sleep apnea.  Will check dig level in AM    4 Alzheimer's dementia with behavioral  disturbance: Psychotropic/psychoactive medications Supportive care   sitter prn Psychiatry consulted resume home meds; Seroquel, trazodone. \  EKG noted independently, has A fib, known LBBB  Code Status: Full code DVT Prophylaxis:  On Coumadin Family Communication:  None at bedside this am.   Disposition Plan: SNF Barrier to D/C: ongoing cardiac work up and stabilization from a cardiac standpoint, appreciate cardiology following up, patient to get dig level in am.   Time Spent in minutes   35 minutes  Procedures:    Consultants:   Cardiology-Dr. Johnsie Cancel Appreciate Dr Acie Fredrickson input on 12-05-16.   Antimicrobials:      Medications  Scheduled Meds: . atorvastatin  40 mg Oral q1800  . chlorhexidine  15 mL Mouth Rinse BID  . digoxin  0.0625 mg Oral q morning - 10a  . famotidine  10 mg Oral BID  . feeding supplement (ENSURE ENLIVE)  237 mL Oral BID BM  . furosemide  40 mg Intravenous Q12H  . isosorbide dinitrate  10 mg Oral TID  . LORazepam  1 mg Intravenous Once  . mouth rinse  15 mL Mouth Rinse q12n4p  .  pantoprazole  40 mg Oral BID  . QUEtiapine  25 mg Oral BID  . sertraline  100 mg Oral QPM  . sodium chloride flush  3 mL Intravenous Q12H  . tamsulosin  0.4 mg Oral QPC breakfast  . traZODone  50 mg Oral QHS  . Warfarin - Pharmacist Dosing Inpatient   Does not apply q1800   Continuous Infusions: . sodium chloride     PRN Meds:.sodium chloride, acetaminophen **OR** acetaminophen, HYDROcodone-acetaminophen, labetalol, levalbuterol, ondansetron **OR** ondansetron (ZOFRAN) IV, polyethylene glycol, sodium chloride flush, traZODone   Antibiotics   Anti-infectives    None        Subjective:   Mark [name] was seen and examined today.  He is alert, he denies chest pain. He wants to go play golf, he is in no distress.   Objective:   Vitals:   12/04/16 2051 12/05/16 0632 12/05/16 0825 12/05/16 1423  BP: (!) 153/85 (!) 137/104 (!) 136/94 (!) 160/103  Pulse: 79  100  92  Resp: 20 20  20   Temp: (!) 97.5 F (36.4 C) 97.6 F (36.4 C)  (!) 97.5 F (36.4 C)  TempSrc: Axillary Axillary  Axillary  SpO2: 95% 96%  100%  Weight:  91.7 kg (202 lb 2.6 oz)    Height:        Intake/Output Summary (Last 24 hours) at 12/05/16 1440 Last data filed at 12/05/16 0931  Gross per 24 hour  Intake              803 ml  Output             1150 ml  Net             -347 ml     Wt Readings from Last 3 Encounters:  12/05/16 91.7 kg (202 lb 2.6 oz)  11/27/16 94.3 kg (208 lb)  11/23/16 93.9 kg (207 lb)     Exam  General: NAD, pleasantly confused, trying to get out of bed at times.   Neck: supple,   Cardiovascular: S 1, S 2 RRR  Respiratory: respiratory effort normal, crackles bilateral.   Gastrointestinal: Soft , NT, ND  Ext: (-) no edema/   Neuro: alert, , confused but follows command.   Skin: no rash.    Data Reviewed:  I have personally reviewed following labs and imaging studies  Micro Results Recent Results (from the past 240 hour(s))  MRSA PCR Screening     Status: None   Collection Time: 12/02/16 10:21 PM  Result Value Ref Range Status   MRSA by PCR NEGATIVE NEGATIVE Final    Comment:        The GeneXpert MRSA Assay (FDA approved for NASAL specimens only), is one component of a comprehensive MRSA colonization surveillance program. It is not intended to diagnose MRSA infection nor to guide or monitor treatment for MRSA infections.     Radiology Reports Dg Chest 2 View  Result Date: 12/02/2016 CLINICAL DATA:  Shortness of breath.  History of CHF. EXAM: CHEST  2 VIEW COMPARISON:  Chest radiograph 10/17/2016 FINDINGS: Monitoring leads overlie the patient. Stable cardiomegaly. Pulmonary vascular redistribution and mild interstitial opacities bilaterally. Small bilateral pleural effusions. No pneumothorax. Thoracic spine degenerative changes. Suspect skin fold projecting over the lower right hemithorax. IMPRESSION: Cardiomegaly,  pulmonary vascular redistribution and mild interstitial edema. Small bilateral pleural effusions. Electronically Signed   By: Lovey Newcomer M.D.   On: 12/02/2016 16:54    Lab Data:  CBC:  Recent Labs Lab  12/02/16 1651 12/03/16 0127 12/04/16 0626 12/05/16 0454  WBC 4.4 4.9 3.5* 5.2  HGB 8.8* 8.5* 8.7* 8.5*  HCT 28.2* 27.5* 28.3* 27.9*  MCV 86.5 86.5 86.5 88.0  PLT 196 193 178 201   Basic Metabolic Panel:  Recent Labs Lab 12/02/16 1651 12/03/16 0127 12/03/16 1106 12/04/16 0626 12/05/16 0454  NA 143 143 143 144 143  K 3.5 3.5 3.4* 3.8 3.7  CL 106 107 108 107 105  CO2 26 25 26 27 29   GLUCOSE 100* 134* 102* 112* 118*  BUN 30* 31* 29* 30* 33*  CREATININE 2.37* 2.25* 2.19* 2.24* 2.19*  CALCIUM 8.7* 8.7* 8.6* 8.7* 8.7*  MG  --  2.1  --   --   --   PHOS  --  4.0  --   --   --    GFR: Estimated Creatinine Clearance: 33 mL/min (A) (by C-G formula based on SCr of 2.19 mg/dL (H)). Liver Function Tests:  Recent Labs Lab 12/02/16 1651 12/03/16 0127  AST 22 24  ALT 8* 10*  ALKPHOS 87 79  BILITOT 1.4* 1.7*  PROT 6.6 6.2*  ALBUMIN 3.4* 3.2*   No results for input(s): LIPASE, AMYLASE in the last 168 hours. No results for input(s): AMMONIA in the last 168 hours. Coagulation Profile:  Recent Labs Lab 12/02/16 1651 12/03/16 0127 12/04/16 0626 12/05/16 0454  INR 1.54 1.61 2.09 3.25   Cardiac Enzymes:  Recent Labs Lab 12/03/16 0127 12/03/16 0713 12/03/16 1106 12/03/16 1613 12/03/16 2310  TROPONINI 0.07* 0.09* 0.14* 0.23* 0.21*   BNP (last 3 results) No results for input(s): PROBNP in the last 8760 hours. HbA1C: No results for input(s): HGBA1C in the last 72 hours. CBG: No results for input(s): GLUCAP in the last 168 hours. Lipid Profile: No results for input(s): CHOL, HDL, LDLCALC, TRIG, CHOLHDL, LDLDIRECT in the last 72 hours. Thyroid Function Tests:  Recent Labs  12/03/16 0127  TSH 5.016*   Anemia Panel: No results for input(s): VITAMINB12, FOLATE,  FERRITIN, TIBC, IRON, RETICCTPCT in the last 72 hours. Urine analysis:    Component Value Date/Time   COLORURINE YELLOW 12/02/2016 1946   APPEARANCEUR CLEAR 12/02/2016 1946   LABSPEC 1.006 12/02/2016 1946   PHURINE 7.0 12/02/2016 1946   GLUCOSEU NEGATIVE 12/02/2016 1946   GLUCOSEU NEGATIVE 07/17/2014 0945   HGBUR NEGATIVE 12/02/2016 1946   BILIRUBINUR NEGATIVE 12/02/2016 1946   KETONESUR NEGATIVE 12/02/2016 1946   PROTEINUR NEGATIVE 12/02/2016 1946   UROBILINOGEN 0.2 07/17/2014 0945   NITRITE NEGATIVE 12/02/2016 1946   LEUKOCYTESUR NEGATIVE 12/02/2016 1946     Loistine Chance M.D. [facility] 12/05/2016, 2:40 PM  Pager: [phone]   Between 7am to 7pm - call Pager - [phone]   After 7pm go to [email or web address] - password [email or web address]  Call night coverage person covering after 7pm

## 2016-12-05 NOTE — Consult Note (Signed)
   [facility] CM Inpatient Consult   12/05/2016  NOEL RODIER 1940-10-03 [id number]    Patient screened for potential [facility] Care Management services.   Chart reviewed. Appears discharge plan is for SNF placement.   Confirmed disposition plans with inpatient LCSW.   No identifiable [facility] Care Management needs at this time.   Marthenia Rolling, MSN-Ed, RN,BSN [facility] Liaison [phone]

## 2016-12-05 NOTE — Progress Notes (Signed)
Report received from night RN that restraint order was not carried out. Patient has been resting this am. Will continue to monitor

## 2016-12-06 DIAGNOSIS — I5023 Acute on chronic systolic (congestive) heart failure: Secondary | ICD-10-CM

## 2016-12-06 LAB — CBC
HEMATOCRIT: 30 % — AB (ref 39.0–52.0)
Hemoglobin: 9 g/dL — ABNORMAL LOW (ref 13.0–17.0)
MCH: 26.5 pg (ref 26.0–34.0)
MCHC: 30 g/dL (ref 30.0–36.0)
MCV: 88.2 fL (ref 78.0–100.0)
Platelets: 194 10*3/uL (ref 150–400)
RBC: 3.4 MIL/uL — ABNORMAL LOW (ref 4.22–5.81)
RDW: 19.2 % — AB (ref 11.5–15.5)
WBC: 5.2 10*3/uL (ref 4.0–10.5)

## 2016-12-06 LAB — BASIC METABOLIC PANEL
Anion gap: 10 (ref 5–15)
BUN: 31 mg/dL — AB (ref 6–20)
CO2: 28 mmol/L (ref 22–32)
Calcium: 8.8 mg/dL — ABNORMAL LOW (ref 8.9–10.3)
Chloride: 106 mmol/L (ref 101–111)
Creatinine, Ser: 2.16 mg/dL — ABNORMAL HIGH (ref 0.61–1.24)
GFR calc Af Amer: 33 mL/min — ABNORMAL LOW (ref >60)
GFR, EST NON AFRICAN AMERICAN: 29 mL/min — AB (ref >60)
GLUCOSE: 104 mg/dL — AB (ref 65–99)
POTASSIUM: 3.7 mmol/L (ref 3.5–5.1)
Sodium: 144 mmol/L (ref 135–145)

## 2016-12-06 LAB — PROTIME-INR
INR: 2.66
Prothrombin Time: 28.9 seconds — ABNORMAL HIGH (ref 11.4–15.2)

## 2016-12-06 LAB — DIGOXIN LEVEL: Digoxin Level: 0.6 ng/mL — ABNORMAL LOW (ref 0.8–2.0)

## 2016-12-06 MED ORDER — METOPROLOL TARTRATE 25 MG PO TABS
12.5000 mg | ORAL_TABLET | Freq: Two times a day (BID) | ORAL | Status: DC
  Administered 2016-12-06 – 2016-12-07 (×3): 12.5 mg via ORAL
  Filled 2016-12-06 (×3): qty 1

## 2016-12-06 MED ORDER — WARFARIN SODIUM 2.5 MG PO TABS
2.5000 mg | ORAL_TABLET | Freq: Once | ORAL | Status: AC
  Administered 2016-12-06: 2.5 mg via ORAL
  Filled 2016-12-06: qty 1

## 2016-12-06 MED ORDER — LORAZEPAM 2 MG/ML IJ SOLN
1.0000 mg | Freq: Once | INTRAMUSCULAR | Status: AC
  Administered 2016-12-06: 1 mg via INTRAVENOUS
  Filled 2016-12-06: qty 1

## 2016-12-06 NOTE — Progress Notes (Signed)
PT Cancellation Note  Patient Details Name: Mark [name] MRN: [id number] DOB: 17-Feb-1941   Cancelled Treatment:    Reason Eval/Treat Not Completed: Medical issues which prohibited therapy--elevated BP-152/111. RN aware. Will check back another day.    Weston Anna, MPT Pager: [phone]

## 2016-12-06 NOTE — Progress Notes (Addendum)
Progress Note  Patient Name: Mark [name] Date of Encounter: 12/06/2016  Primary Cardiologist: Dr. Stanford Breed  Subjective   Totally confused, restless, and non verbal    Inpatient Medications    Scheduled Meds: . atorvastatin  40 mg Oral q1800  . chlorhexidine  15 mL Mouth Rinse BID  . digoxin  0.0625 mg Oral q morning - 10a  . famotidine  10 mg Oral BID  . feeding supplement (ENSURE ENLIVE)  237 mL Oral BID BM  . furosemide  40 mg Intravenous Q12H  . isosorbide dinitrate  10 mg Oral TID  . mouth rinse  15 mL Mouth Rinse q12n4p  . pantoprazole  40 mg Oral BID  . QUEtiapine  25 mg Oral BID  . sertraline  100 mg Oral QPM  . sodium chloride flush  3 mL Intravenous Q12H  . tamsulosin  0.4 mg Oral QPC breakfast  . traZODone  50 mg Oral QHS  . warfarin  2.5 mg Oral ONCE-1800  . Warfarin - Pharmacist Dosing Inpatient   Does not apply q1800   Continuous Infusions: . sodium chloride     PRN Meds: sodium chloride, acetaminophen **OR** acetaminophen, HYDROcodone-acetaminophen, levalbuterol, ondansetron **OR** ondansetron (ZOFRAN) IV, polyethylene glycol, sodium chloride flush, traZODone   Vital Signs    Vitals:   12/05/16 2109 12/05/16 2114 12/06/16 0532 12/06/16 0538  BP: (!) 156/107  (!) 147/114 (!) 158/112  Pulse: (!) 105  87   Resp: (!) 24 (!) 22 18   Temp: (!) 97.5 F (36.4 C)  98.2 F (36.8 C)   TempSrc: Axillary  Axillary   SpO2: (!) 83% 92% 97%   Weight:   200 lb 6.4 oz (90.9 kg)   Height:        Intake/Output Summary (Last 24 hours) at 12/06/16 0901 Last data filed at 12/06/16 0534  Gross per 24 hour  Intake                0 ml  Output              850 ml  Net             -850 ml   Filed Weights   12/04/16 0615 12/05/16 0632 12/06/16 0532  Weight: 200 lb 9.9 oz (91 kg) 202 lb 2.6 oz (91.7 kg) 200 lb 6.4 oz (90.9 kg)    Telemetry    HR 120-130 AF  - Personally Reviewed  ECG    No new - Personally Reviewed  Physical Exam   GEN: pale restless,  not following comands Neck: + JVD Cardiac: irreg irreg, + murmurs best heard Lt lateral, 4th IC space, no rubs, or gallops.  Respiratory: Clear to auscultation bilaterally, from ant position no wheezes or rales. GI: Soft, nontender, non-distended  MS: No edema; No deformity. Neuro:  Nonfocal  Psych: totally disoriented this am  Labs    Chemistry Recent Labs Lab 12/02/16 1651 12/03/16 0127  12/04/16 0626 12/05/16 0454 12/06/16 0509  NA 143 143  < > 144 143 144  K 3.5 3.5  < > 3.8 3.7 3.7  CL 106 107  < > 107 105 106  CO2 26 25  < > 27 29 28   GLUCOSE 100* 134*  < > 112* 118* 104*  BUN 30* 31*  < > 30* 33* 31*  CREATININE 2.37* 2.25*  < > 2.24* 2.19* 2.16*  CALCIUM 8.7* 8.7*  < > 8.7* 8.7* 8.8*  PROT 6.6 6.2*  --   --   --   --  ALBUMIN 3.4* 3.2*  --   --   --   --   AST 22 24  --   --   --   --   ALT 8* 10*  --   --   --   --   ALKPHOS 87 79  --   --   --   --   BILITOT 1.4* 1.7*  --   --   --   --   GFRNONAA 26* 27*  < > 27* 28* 29*  GFRAA 30* 32*  < > 32* 33* 33*  ANIONGAP 11 11  < > 10 9 10   < > = values in this interval not displayed.   Hematology  Recent Labs Lab 12/04/16 0626 12/05/16 0454 12/06/16 0509  WBC 3.5* 5.2 5.2  RBC 3.27* 3.17* 3.40*  HGB 8.7* 8.5* 9.0*  HCT 28.3* 27.9* 30.0*  MCV 86.5 88.0 88.2  MCH 26.6 26.8 26.5  MCHC 30.7 30.5 30.0  RDW 18.7* 19.0* 19.2*  PLT 178 201 194    Cardiac Enzymes  Recent Labs Lab 12/03/16 0713 12/03/16 1106 12/03/16 1613 12/03/16 2310  TROPONINI 0.09* 0.14* 0.23* 0.21*     Recent Labs Lab 12/02/16 1655  TROPIPOC 0.10*     BNP  Recent Labs Lab 12/02/16 1651 12/04/16 0626  BNP 2,238.5* 3,613.9*     DDimer No results for input(s): DDIMER in the last 168 hours.   Radiology    CXR 12/02/16- IMPRESSION: Cardiomegaly, pulmonary vascular redistribution and mild interstitial edema.  Small bilateral pleural effusions.  Cardiac Studies   Echo 12/04/16 Study Conclusions  - Left  ventricle: The cavity size was normal. There was moderate   concentric hypertrophy. Systolic function was severely reduced.   The estimated ejection fraction was in the range of 25% to 30%.   There is hypokinesis of the anteroseptal myocardium. - Ventricular septum: Septal motion showed abnormal function and   dyssynergy. - Aortic valve: There was mild regurgitation. - Aorta: Aortic root dimension: 42 mm (ED). - Ascending aorta: The ascending aorta was mildly dilated. - Mitral valve: Calcified annulus. There was moderate   regurgitation. - Left atrium: The atrium was moderately dilated. - Right atrium: The atrium was severely dilated. - Pericardium, extracardiac: There was a left pleural effusion.  Impressions:  - Compared to the prior study, there has been no significant   interval change.  Patient Profile     76 y.o. male with hx of dementia in SNF, CAD, Permanent AF, HTN,  Chronic systolic CHF ( EF 51-76%) AAA repair with residual endoleak, LBBB.  Admitted with increased shortness of breath - CHF and minimally elevated troponin.     Assessment & Plan    Permanent a fib, RVR on admit with acute CHF, then with slower rate at times and pauses of 2.5 sec.  Coreg has been stopped.  dig level 0.6  Anticoagulation CHA2DS2VASc is 5.  INR today 2.66   Sleep apnea doubt he will wear CPAP with confusion  Cardiomyopathy/acute  Systolic HF.  EF continues 25 to 30%.  I&O neg 1L wt down 5 lbs.    MR moderate no changes.  Elevated troponin pk troponin 0.23 pt has known coronary artery disease. His troponin levels are trending down. It is quite possible that his mildly elevated troponin level is due to exacerbation of CHF. 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 SCr stable 2.16  Dementia- see psychiatric eval 12/03/16  Plan: This is the first time I have seen Mr Dilorenzo. I will discuss with MD before witting any medication orders.  Difficult to tell volume status on exam but his BNP was 3K on 7/30, will re check in am. He is still on IV Lasix BID.   I could find no pauses > 2.4 seconds.  I would resume his beta blocker at a lower dose and take him off telemetry.   I see he is still a full code-? If this needs to be reviewed.  Signed, Kerin Ransom, PA-C  12/06/2016, 9:01 AM    Attending Note:   The patient was seen and examined.  Agree with assessment and plan as noted above.  Changes made to the above note as needed.  Patient seen and independently examined with Kerin Ransom, PA .   We discussed all aspects of the encounter. I agree with the assessment and plan as stated above.  1.   Atrial fib:   Will add back low dose metoprolol for better rate control Agree with a non-aggressive approach.  2.  Acute on chronic CHF:   I/O are - 1.0 liters so far Continue lasix, transition to PO in a day or so   3.  Elevated troponin:  Likely due to CHF No ischemic evaluation indicated.    4.  Dementia:   Quite severe. Pt is not able to communicate.    Code status should be addressed     I have spent a total of 30 minutes with patient reviewing hospital  notes , telemetry, EKGs, labs and examining patient as well as establishing an assessment and plan that was discussed with the patient. > 50% of time was spent in direct patient care.  No further cardiology recommendations. Will sign off. Call for questions   Ramond Dial., MD, [facility] 12/06/2016, 10:03 AM [address]  [place] Pager [phone]

## 2016-12-06 NOTE — Progress Notes (Signed)
PROGRESS NOTE    Mark [name]  SEG:[id number] DOB: 1941/01/30 DOA: 12/02/2016 PCP: [name], NP   Brief Narrative:  76 year old male with past medical history of Alzheimer's dementia, systolic CHF, coronary artery disease, abdominal aortic aneurysm, atrial fibrillation on chronic Coumadin and COPD came to the ER with agitation and found to be in respiratory failure due to acute on chronic CHF.   Assessment & Plan:   Active Problems:   Hyperlipidemia   Depression   Essential hypertension   AAA (abdominal aortic aneurysm) (HCC)   Atrial fibrillation, chronic (HCC)   Acute on chronic systolic congestive heart failure (HCC)   Alzheimer's dementia with behavioral disturbance   Elevated troponin   Acute combined systolic and diastolic CHF, NYHA class 1 (HCC)  Acute systolic CHF, improved -Continue daily weights and input output -Echocardiogram shows 25-30 percent -Evaluated by cardiology -We'll give him 1 more day of 5 he Lasix and then transition him to oral tomorrow  History of atrial fibrillation -Low dose metoprolol added per cardiology -Continue Coumadin  Elevated troponin -Likely from slight demand given CHF. No further workup  Alzheimer's dementia with some behavioral disturbances -Evaluated by psychiatry, appreciated medical dictation recommendations -Plan to discharge him eventually at SNF with dementia unit   DVT prophylaxis: On Coumadin Code Status: Full code Family Communication:  None at bedside. Will attempt to call his family Disposition Plan: Likely discharge in next 24-48 hours  Consultants:   Cardiology  Procedures:   None  Antimicrobials:   None   Subjective: Patient does not have any complaints and he is pleasantly confused at this time. He is tolerating his breakfast.  Objective: Vitals:   12/05/16 2109 12/05/16 2114 12/06/16 0532 12/06/16 0538  BP: (!) 156/107  (!) 147/114 (!) 158/112  Pulse: (!) 105  87   Resp: (!) 24  (!) 22 18   Temp: (!) 97.5 F (36.4 C)  98.2 F (36.8 C)   TempSrc: Axillary  Axillary   SpO2: (!) 83% 92% 97%   Weight:   90.9 kg (200 lb 6.4 oz)   Height:        Intake/Output Summary (Last 24 hours) at 12/06/16 1159 Last data filed at 12/06/16 0900  Gross per 24 hour  Intake              120 ml  Output              650 ml  Net             -530 ml   Filed Weights   12/04/16 0615 12/05/16 0632 12/06/16 0532  Weight: 91 kg (200 lb 9.9 oz) 91.7 kg (202 lb 2.6 oz) 90.9 kg (200 lb 6.4 oz)    Examination:  General exam: Appears calm and comfortable; follows commands Respiratory system: By basilar crackles Cardiovascular system: S1 & S2 heard, RRR. No JVD, murmurs, rubs, gallops or clicks. No pedal edema. Gastrointestinal system: Abdomen is nondistended, soft and nontender. No organomegaly or masses felt. Normal bowel sounds heard. Central nervous system: Alert and oriented x 1. No focal neurological deficits. Extremities: Symmetric 5 x 5 power. Skin: No rashes, lesions or ulcers Psychiatry: Judgement and insight appear normal. Mood & affect appropriate.     Data Reviewed:   CBC:  Recent Labs Lab 12/02/16 1651 12/03/16 0127 12/04/16 0626 12/05/16 0454 12/06/16 0509  WBC 4.4 4.9 3.5* 5.2 5.2  HGB 8.8* 8.5* 8.7* 8.5* 9.0*  HCT 28.2* 27.5* 28.3* 27.9* 30.0*  MCV 86.5  86.5 86.5 88.0 88.2  PLT 196 193 178 201 938   Basic Metabolic Panel:  Recent Labs Lab 12/03/16 0127 12/03/16 1106 12/04/16 0626 12/05/16 0454 12/06/16 0509  NA 143 143 144 143 144  K 3.5 3.4* 3.8 3.7 3.7  CL 107 108 107 105 106  CO2 25 26 27 29 28   GLUCOSE 134* 102* 112* 118* 104*  BUN 31* 29* 30* 33* 31*  CREATININE 2.25* 2.19* 2.24* 2.19* 2.16*  CALCIUM 8.7* 8.6* 8.7* 8.7* 8.8*  MG 2.1  --   --   --   --   PHOS 4.0  --   --   --   --    GFR: Estimated Creatinine Clearance: 33.4 mL/min (A) (by C-G formula based on SCr of 2.16 mg/dL (H)). Liver Function Tests:  Recent Labs Lab  12/02/16 1651 12/03/16 0127  AST 22 24  ALT 8* 10*  ALKPHOS 87 79  BILITOT 1.4* 1.7*  PROT 6.6 6.2*  ALBUMIN 3.4* 3.2*   No results for input(s): LIPASE, AMYLASE in the last 168 hours. No results for input(s): AMMONIA in the last 168 hours. Coagulation Profile:  Recent Labs Lab 12/02/16 1651 12/03/16 0127 12/04/16 0626 12/05/16 0454 12/06/16 0509  INR 1.54 1.61 2.09 3.25 2.66   Cardiac Enzymes:  Recent Labs Lab 12/03/16 0127 12/03/16 0713 12/03/16 1106 12/03/16 1613 12/03/16 2310  TROPONINI 0.07* 0.09* 0.14* 0.23* 0.21*   BNP (last 3 results) No results for input(s): PROBNP in the last 8760 hours. HbA1C: No results for input(s): HGBA1C in the last 72 hours. CBG: No results for input(s): GLUCAP in the last 168 hours. Lipid Profile: No results for input(s): CHOL, HDL, LDLCALC, TRIG, CHOLHDL, LDLDIRECT in the last 72 hours. Thyroid Function Tests: No results for input(s): TSH, T4TOTAL, FREET4, T3FREE, THYROIDAB in the last 72 hours. Anemia Panel: No results for input(s): VITAMINB12, FOLATE, FERRITIN, TIBC, IRON, RETICCTPCT in the last 72 hours. Sepsis Labs:  Recent Labs Lab 12/02/16 1657  LATICACIDVEN 0.99    Recent Results (from the past 240 hour(s))  MRSA PCR Screening     Status: None   Collection Time: 12/02/16 10:21 PM  Result Value Ref Range Status   MRSA by PCR NEGATIVE NEGATIVE Final    Comment:        The GeneXpert MRSA Assay (FDA approved for NASAL specimens only), is one component of a comprehensive MRSA colonization surveillance program. It is not intended to diagnose MRSA infection nor to guide or monitor treatment for MRSA infections.          Radiology Studies: No results found.      Scheduled Meds: . atorvastatin  40 mg Oral q1800  . chlorhexidine  15 mL Mouth Rinse BID  . digoxin  0.0625 mg Oral q morning - 10a  . famotidine  10 mg Oral BID  . feeding supplement (ENSURE ENLIVE)  237 mL Oral BID BM  . furosemide  40  mg Intravenous Q12H  . isosorbide dinitrate  10 mg Oral TID  . mouth rinse  15 mL Mouth Rinse q12n4p  . metoprolol tartrate  12.5 mg Oral BID  . pantoprazole  40 mg Oral BID  . QUEtiapine  25 mg Oral BID  . sertraline  100 mg Oral QPM  . sodium chloride flush  3 mL Intravenous Q12H  . tamsulosin  0.4 mg Oral QPC breakfast  . traZODone  50 mg Oral QHS  . warfarin  2.5 mg Oral ONCE-1800  . Warfarin - Pharmacist  Dosing Inpatient   Does not apply q1800   Continuous Infusions: . sodium chloride       LOS: 4 days    Time spent: 35 mins     Anastasha Ortez Arsenio Loader, MD [facility] Pager [phone]   If 7PM-7AM, please contact night-coverage [email or web address] Password [email or web address] 12/06/2016, 11:59 AM

## 2016-12-06 NOTE — Progress Notes (Signed)
CSW contacted by [employer] staff. Staff requested additional clinical documents, CSW faxed requested documents. CSW waiting to receive return call from insurance staff about patient's insurance authorization for SNF.   Abundio Miu, [place] Social Worker [facility] Cell#: [phone]

## 2016-12-06 NOTE — Care Management Important Message (Signed)
Important Message  Patient Details  Name: Mark [name] MRN: [id number] Date of Birth: [birth year]   Medicare Important Message Given:  Yes    Kerin Salen 12/06/2016, 11:30 AM

## 2016-12-06 NOTE — Progress Notes (Signed)
[place] for coumadin Indication: atrial fibrillation  Allergies  Allergen Reactions  . Ace Inhibitors Cough    Patient Measurements: Height: 6' (182.9 cm) Weight: 200 lb 6.4 oz (90.9 kg) IBW/kg (Calculated) : 77.6   Vital Signs: Temp: 98.2 F (36.8 C) (08/01 0532) Temp Source: Axillary (08/01 0532) BP: 158/112 (08/01 0538) Pulse Rate: 87 (08/01 0532)  Labs:  Recent Labs  12/03/16 1106 12/03/16 1613 12/03/16 2310  12/04/16 0626 12/05/16 0454 12/06/16 0509  HGB  --   --   --   < > 8.7* 8.5* 9.0*  HCT  --   --   --   --  28.3* 27.9* 30.0*  PLT  --   --   --   --  178 201 194  LABPROT  --   --   --   --  23.8* 33.9* 28.9*  INR  --   --   --   --  2.09 3.25 2.66  CREATININE 2.19*  --   --   --  2.24* 2.19* 2.16*  TROPONINI 0.14* 0.23* 0.21*  --   --   --   --   < > = values in this interval not displayed.  Estimated Creatinine Clearance: 33.4 mL/min (A) (by C-G formula based on SCr of 2.16 mg/dL (H)).   Medical History: Past Medical History:  Diagnosis Date  . AAA (abdominal aortic aneurysm) (HCC)    9.1 cm repair 05/2009 ( ENDOVASCULAR REPAIR ).  MORE RECENT RE-EVALUATION OF ENLARGING AAA BY DR. Trula Slade AND BY DR. Kathlene Cote  MAY 2015 - PT STATES HE UNDERSTANDS THAT NOTHING IS LEAKING AT PRESENT TIME AND HE IS TO FOLLOW UP WITH HIS DOCTORS FOR FUTURE MONITORING I  . Arthritis    LEFT HAND, RT KNEE (08/14/2016)  . Basal cell carcinoma    "right side of my nose; think they burned it off" (08/14/2016)  . CAD (coronary artery disease)    a. s/p PCI to LAD/diagonal, subtotal of PDA 2006. b. Negative nuc 2014.  Marland Kitchen Chronic atrial fibrillation ([place])   . Chronic systolic CHF (congestive heart failure) (HCC)    a. EF 20-25% dx in 08/2016; etiology not yet clear.  . CKD (chronic kidney disease), stage III    a. stage III-IV, Cr baseline appears 1.8-2.0  . COPD (chronic obstructive pulmonary disease) ([place])   . Depression   . GERD  (gastroesophageal reflux disease)   . History of kidney stones   . History of pneumothorax    LONG TIME AGO  . Hyperlipidemia   . Hypertension   . Migraine    "very rare the last 20 years" (08/14/2016)  . Myocardial infarction ([place]) 2006  . Osteoarthritis   . Renal cell carcinoma ([place])   . Shingles outbreak    PT NOTICED SKIN RASH RT GROIN AND RIGHT TRUNK ON MONDAY 10/27/13 - NOT A LOT OF DISCOMFORT- NOT ON ANY ORAL MEDS TO TREAT.  . Sleep apnea    "dx'd; didn't try mask" (08/14/2016)  . Stroke [facility])    "CT showed I've had old strokes; never knew about it before today" (08/14/2016)    Medications:  Prescriptions Prior to Admission  Medication Sig Dispense Refill Last Dose  . albuterol (PROVENTIL HFA;VENTOLIN HFA) 108 (90 Base) MCG/ACT inhaler Inhale 2 puffs into the lungs every 6 (six) hours as needed for wheezing or shortness of breath. 1 Inhaler 0 Past Month at Unknown time  . atorvastatin (LIPITOR) 40 MG tablet TAKE  1 TABLET ONE TIME DAILY (Patient taking differently: Take 40 mg by mouth daily at 6 PM. TAKE 1 TABLET ONE TIME DAILY) 90 tablet 1 12/01/2016 at Unknown time  . carvedilol (COREG) 12.5 MG tablet Take 12.5 mg by mouth 2 (two) times daily.   12/02/2016 at 1700  . Coenzyme Q10 200 MG capsule Take 200 mg by mouth daily.   12/02/2016 at Unknown time  . digoxin 62.5 MCG TABS Please take 0.125 mg oral daily for 1 days, then change to 0.0625 mg oral daily on 6/23. (Patient taking differently: Take 0.0625 mg by mouth daily. Please take 0.125 mg oral daily for 1 days, then change to 0.0625 mg oral daily on 6/23.)   12/02/2016 at Unknown time  . isosorbide dinitrate (ISORDIL) 10 MG tablet Take 1 tablet (10 mg total) by mouth 3 (three) times daily.   12/02/2016 at 1500  . levalbuterol (XOPENEX) 0.63 MG/3ML nebulizer solution Take 3 mLs (0.63 mg total) by nebulization every 6 (six) hours as needed for wheezing or shortness of breath. 3 mL 12 Past Month at Unknown time  . Multiple Vitamins-Minerals  (MACUVITE EYE CARE PO) Take 1 capsule by mouth daily.   12/02/2016 at Unknown time  . Omega-3 Fatty Acids (FISH OIL) 1200 MG CAPS Take 1,200 mg by mouth daily.    12/02/2016 at Unknown time  . OXYGEN Inhale 3 L/min into the lungs continuous.   12/02/2016 at Unknown time  . pantoprazole (PROTONIX) 40 MG tablet Take 1 tablet (40 mg total) by mouth 2 (two) times daily.   12/02/2016 at Unknown time  . polyethylene glycol (MIRALAX / GLYCOLAX) packet Take 17 g by mouth daily as needed. 14 each 0 Past Month at Unknown time  . potassium chloride SA (K-DUR,KLOR-CON) 20 MEQ tablet Take 20-40 mEq by mouth as directed. Take 1 tablet to = 20 mEq M-W-F, take 2 tablets to = 40 mEq Tue-Thu-Sat-Sun    12/02/2016 at Unknown time  . QUEtiapine (SEROQUEL) 50 MG tablet Take 50 mg by mouth 2 (two) times daily.   12/02/2016 at Unknown time  . ranitidine (ZANTAC) 150 MG tablet Take 1 tablet (150 mg total) by mouth 2 (two) times daily. 180 tablet 1 12/02/2016 at Unknown time  . sertraline (ZOLOFT) 100 MG tablet Take 1 tablet (100 mg total) by mouth every evening. 90 tablet 1 12/01/2016 at Unknown time  . tamsulosin (FLOMAX) 0.4 MG CAPS capsule TAKE 1 CAPSULE (0.4 MG TOTAL) DAILY AFTER BREAKFAST. (Patient taking differently: Take 0.4 mg by mouth daily after breakfast. TAKE 1 CAPSULE (0.4 MG TOTAL) DAILY AFTER BREAKFAST.) 90 capsule 1 12/02/2016 at Unknown time  . torsemide (DEMADEX) 20 MG tablet Take 20 mg by mouth 2 (two) times daily.   12/02/2016 at Unknown time  . traZODone (DESYREL) 50 MG tablet Take 25-50 mg by mouth as directed. Take 50mg  every night at bedtime. May take 25mg  up to twice daily as needed for anxiety   Past Week at Unknown time  . warfarin (COUMADIN) 5 MG tablet Take 5-7.5 mg by mouth as directed. Take 5mg  Monday-Friday. Take 7.5mg  Saturday and Sunday.   12/01/2016 at Unknown time    Assessment: 76 yo M from SNF on coumadin for Afib. INR 1.54 is subtherapeutic on admission.  Patient had not been receiving prescribed  dose PTA which likely explains low level.    Note: warfarin at SNF was transcribed incorrectly and pt was not getting it as intended. Pt was supposed to be on warfarin 5  mg Mon-Fr and 7.5 mg Sat/Sun but transcription error had pt on 5 mg Mon- Fri and nothing Sat/Sun. No dose given today.  SNF not sure how long error in place  12/06/2016:  INR dropped back to therapeutic range after inpatient warfarin doses of 7.5mg , 7.5mg , 5mg  and 0mg   Hg low but stable, Pltc WNL  No bleeding noted  Goal of Therapy:  INR 2-3   Plan:  1) Resume warfarin at 2.5mg  today 2) Daily INR 3) Monitor for signs and symptoms of bleeding   Adrian Saran, PharmD, BCPS Pager [phone] 12/06/2016 7:26 AM

## 2016-12-07 DIAGNOSIS — I5041 Acute combined systolic (congestive) and diastolic (congestive) heart failure: Secondary | ICD-10-CM | POA: Diagnosis not present

## 2016-12-07 DIAGNOSIS — I509 Heart failure, unspecified: Secondary | ICD-10-CM | POA: Diagnosis not present

## 2016-12-07 DIAGNOSIS — F29 Unspecified psychosis not due to a substance or known physiological condition: Secondary | ICD-10-CM | POA: Diagnosis not present

## 2016-12-07 DIAGNOSIS — R4182 Altered mental status, unspecified: Secondary | ICD-10-CM | POA: Diagnosis not present

## 2016-12-07 DIAGNOSIS — I10 Essential (primary) hypertension: Secondary | ICD-10-CM

## 2016-12-07 DIAGNOSIS — G309 Alzheimer's disease, unspecified: Secondary | ICD-10-CM | POA: Diagnosis not present

## 2016-12-07 DIAGNOSIS — E785 Hyperlipidemia, unspecified: Secondary | ICD-10-CM | POA: Diagnosis not present

## 2016-12-07 DIAGNOSIS — N184 Chronic kidney disease, stage 4 (severe): Secondary | ICD-10-CM | POA: Diagnosis not present

## 2016-12-07 DIAGNOSIS — I482 Chronic atrial fibrillation: Secondary | ICD-10-CM | POA: Diagnosis not present

## 2016-12-07 DIAGNOSIS — F22 Delusional disorders: Secondary | ICD-10-CM | POA: Diagnosis not present

## 2016-12-07 DIAGNOSIS — R278 Other lack of coordination: Secondary | ICD-10-CM | POA: Diagnosis not present

## 2016-12-07 DIAGNOSIS — F322 Major depressive disorder, single episode, severe without psychotic features: Secondary | ICD-10-CM | POA: Diagnosis not present

## 2016-12-07 DIAGNOSIS — F323 Major depressive disorder, single episode, severe with psychotic features: Secondary | ICD-10-CM | POA: Diagnosis not present

## 2016-12-07 DIAGNOSIS — I5021 Acute systolic (congestive) heart failure: Secondary | ICD-10-CM | POA: Diagnosis not present

## 2016-12-07 DIAGNOSIS — F321 Major depressive disorder, single episode, moderate: Secondary | ICD-10-CM

## 2016-12-07 DIAGNOSIS — F0281 Dementia in other diseases classified elsewhere with behavioral disturbance: Secondary | ICD-10-CM | POA: Diagnosis not present

## 2016-12-07 DIAGNOSIS — J441 Chronic obstructive pulmonary disease with (acute) exacerbation: Secondary | ICD-10-CM | POA: Diagnosis not present

## 2016-12-07 DIAGNOSIS — F0151 Vascular dementia with behavioral disturbance: Secondary | ICD-10-CM | POA: Diagnosis not present

## 2016-12-07 DIAGNOSIS — I5023 Acute on chronic systolic (congestive) heart failure: Secondary | ICD-10-CM | POA: Diagnosis not present

## 2016-12-07 DIAGNOSIS — R2689 Other abnormalities of gait and mobility: Secondary | ICD-10-CM | POA: Diagnosis not present

## 2016-12-07 DIAGNOSIS — R748 Abnormal levels of other serum enzymes: Secondary | ICD-10-CM | POA: Diagnosis not present

## 2016-12-07 DIAGNOSIS — G308 Other Alzheimer's disease: Secondary | ICD-10-CM | POA: Diagnosis not present

## 2016-12-07 DIAGNOSIS — M6281 Muscle weakness (generalized): Secondary | ICD-10-CM | POA: Diagnosis not present

## 2016-12-07 DIAGNOSIS — F0391 Unspecified dementia with behavioral disturbance: Secondary | ICD-10-CM | POA: Diagnosis not present

## 2016-12-07 DIAGNOSIS — I5040 Unspecified combined systolic (congestive) and diastolic (congestive) heart failure: Secondary | ICD-10-CM | POA: Diagnosis not present

## 2016-12-07 DIAGNOSIS — F0280 Dementia in other diseases classified elsewhere without behavioral disturbance: Secondary | ICD-10-CM | POA: Diagnosis not present

## 2016-12-07 DIAGNOSIS — I714 Abdominal aortic aneurysm, without rupture: Secondary | ICD-10-CM | POA: Diagnosis not present

## 2016-12-07 LAB — PROTIME-INR
INR: 2.8
PROTHROMBIN TIME: 30.1 s — AB (ref 11.4–15.2)

## 2016-12-07 LAB — BASIC METABOLIC PANEL
Anion gap: 12 (ref 5–15)
BUN: 32 mg/dL — AB (ref 6–20)
CALCIUM: 8.8 mg/dL — AB (ref 8.9–10.3)
CO2: 26 mmol/L (ref 22–32)
CREATININE: 2.21 mg/dL — AB (ref 0.61–1.24)
Chloride: 105 mmol/L (ref 101–111)
GFR calc Af Amer: 32 mL/min — ABNORMAL LOW (ref >60)
GFR, EST NON AFRICAN AMERICAN: 28 mL/min — AB (ref >60)
Glucose, Bld: 104 mg/dL — ABNORMAL HIGH (ref 65–99)
POTASSIUM: 4.6 mmol/L (ref 3.5–5.1)
SODIUM: 143 mmol/L (ref 135–145)

## 2016-12-07 LAB — BRAIN NATRIURETIC PEPTIDE: B Natriuretic Peptide: 2663.9 pg/mL — ABNORMAL HIGH (ref 0.0–100.0)

## 2016-12-07 LAB — MAGNESIUM: Magnesium: 2.3 mg/dL (ref 1.7–2.4)

## 2016-12-07 MED ORDER — FUROSEMIDE 40 MG PO TABS: 40.0000 mg | ORAL_TABLET | Freq: Every day | ORAL | 0 refills | Status: DC

## 2016-12-07 MED ORDER — METOPROLOL TARTRATE 5 MG/5ML IV SOLN
5.0000 mg | Freq: Once | INTRAVENOUS | Status: AC
  Administered 2016-12-07: 5 mg via INTRAVENOUS
  Filled 2016-12-07: qty 5

## 2016-12-07 MED ORDER — FUROSEMIDE 40 MG PO TABS
40.0000 mg | ORAL_TABLET | Freq: Every day | ORAL | Status: DC
  Administered 2016-12-07: 40 mg via ORAL
  Filled 2016-12-07: qty 1

## 2016-12-07 MED ORDER — METOPROLOL TARTRATE 25 MG PO TABS: 12.5000 mg | ORAL_TABLET | Freq: Two times a day (BID) | ORAL | 0 refills | Status: DC

## 2016-12-07 MED ORDER — WARFARIN SODIUM 2.5 MG PO TABS: 2.5000 mg | ORAL_TABLET | Freq: Once | ORAL | Status: DC

## 2016-12-07 NOTE — Progress Notes (Signed)
CSW assisting with dc planning. [employer] has authorized SNF placement at [facility] for today if pt is stable for dc. CSW will continue to follow to assist with d/c planning to SNF.  Werner Lean LCSW [phone]

## 2016-12-07 NOTE — Care Management Note (Signed)
Case Management Note  Patient Details  Name: Mark [name] MRN: [id number] Date of Birth: 1941-01-23  Subjective/Objective:                    Action/Plan:d/c SNF w/memory care.   Expected Discharge Date:                  Expected Discharge Plan:  Skilled Nursing Facility  In-House Referral:  Clinical Social Work  Discharge planning Services  CM Consult  Post Acute Care Choice:    Choice offered to:     DME Arranged:    DME Agency:     HH Arranged:    [place] Agency:     Status of Service:  Completed, signed off  If discussed at [employer], dates discussed:    Additional Comments:  Dessa Phi, RN 12/07/2016, 10:17 AM

## 2016-12-07 NOTE — Progress Notes (Signed)
[place] for coumadin Indication: atrial fibrillation  Allergies  Allergen Reactions  . Ace Inhibitors Cough    Patient Measurements: Height: 6' (182.9 cm) Weight: 197 lb 12 oz (89.7 kg) IBW/kg (Calculated) : 77.6   Vital Signs: Temp: 97.7 F (36.5 C) (08/02 0509) Temp Source: Oral (08/02 0509) BP: 155/127 (08/02 0509) Pulse Rate: 96 (08/02 0509)  Labs:  Recent Labs  12/05/16 0454 12/06/16 0509 12/07/16 0446  HGB 8.5* 9.0*  --   HCT 27.9* 30.0*  --   PLT 201 194  --   LABPROT 33.9* 28.9* 30.1*  INR 3.25 2.66 2.80  CREATININE 2.19* 2.16* 2.21*    Estimated Creatinine Clearance: 32.7 mL/min (A) (by C-G formula based on SCr of 2.21 mg/dL (H)).   Medical History: Past Medical History:  Diagnosis Date  . AAA (abdominal aortic aneurysm) (HCC)    9.1 cm repair 05/2009 ( ENDOVASCULAR REPAIR ).  MORE RECENT RE-EVALUATION OF ENLARGING AAA BY DR. Trula Slade AND BY DR. Kathlene Cote  MAY 2015 - PT STATES HE UNDERSTANDS THAT NOTHING IS LEAKING AT PRESENT TIME AND HE IS TO FOLLOW UP WITH HIS DOCTORS FOR FUTURE MONITORING I  . Arthritis    LEFT HAND, RT KNEE (08/14/2016)  . Basal cell carcinoma    "right side of my nose; think they burned it off" (08/14/2016)  . CAD (coronary artery disease)    a. s/p PCI to LAD/diagonal, subtotal of PDA 2006. b. Negative nuc 2014.  Marland Kitchen Chronic atrial fibrillation ([place])   . Chronic systolic CHF (congestive heart failure) (HCC)    a. EF 20-25% dx in 08/2016; etiology not yet clear.  . CKD (chronic kidney disease), stage III    a. stage III-IV, Cr baseline appears 1.8-2.0  . COPD (chronic obstructive pulmonary disease) ([place])   . Depression   . GERD (gastroesophageal reflux disease)   . History of kidney stones   . History of pneumothorax    LONG TIME AGO  . Hyperlipidemia   . Hypertension   . Migraine    "very rare the last 20 years" (08/14/2016)  . Myocardial infarction ([place]) 2006  . Osteoarthritis   . Renal  cell carcinoma ([place])   . Shingles outbreak    PT NOTICED SKIN RASH RT GROIN AND RIGHT TRUNK ON MONDAY 10/27/13 - NOT A LOT OF DISCOMFORT- NOT ON ANY ORAL MEDS TO TREAT.  . Sleep apnea    "dx'd; didn't try mask" (08/14/2016)  . Stroke [facility])    "CT showed I've had old strokes; never knew about it before today" (08/14/2016)    Medications:  Prescriptions Prior to Admission  Medication Sig Dispense Refill Last Dose  . albuterol (PROVENTIL HFA;VENTOLIN HFA) 108 (90 Base) MCG/ACT inhaler Inhale 2 puffs into the lungs every 6 (six) hours as needed for wheezing or shortness of breath. 1 Inhaler 0 Past Month at Unknown time  . atorvastatin (LIPITOR) 40 MG tablet TAKE 1 TABLET ONE TIME DAILY (Patient taking differently: Take 40 mg by mouth daily at 6 PM. TAKE 1 TABLET ONE TIME DAILY) 90 tablet 1 12/01/2016 at Unknown time  . carvedilol (COREG) 12.5 MG tablet Take 12.5 mg by mouth 2 (two) times daily.   12/02/2016 at 1700  . Coenzyme Q10 200 MG capsule Take 200 mg by mouth daily.   12/02/2016 at Unknown time  . digoxin 62.5 MCG TABS Please take 0.125 mg oral daily for 1 days, then change to 0.0625 mg oral daily on 6/23. (Patient taking  differently: Take 0.0625 mg by mouth daily. Please take 0.125 mg oral daily for 1 days, then change to 0.0625 mg oral daily on 6/23.)   12/02/2016 at Unknown time  . isosorbide dinitrate (ISORDIL) 10 MG tablet Take 1 tablet (10 mg total) by mouth 3 (three) times daily.   12/02/2016 at 1500  . levalbuterol (XOPENEX) 0.63 MG/3ML nebulizer solution Take 3 mLs (0.63 mg total) by nebulization every 6 (six) hours as needed for wheezing or shortness of breath. 3 mL 12 Past Month at Unknown time  . Multiple Vitamins-Minerals (MACUVITE EYE CARE PO) Take 1 capsule by mouth daily.   12/02/2016 at Unknown time  . Omega-3 Fatty Acids (FISH OIL) 1200 MG CAPS Take 1,200 mg by mouth daily.    12/02/2016 at Unknown time  . OXYGEN Inhale 3 L/min into the lungs continuous.   12/02/2016 at Unknown time  .  pantoprazole (PROTONIX) 40 MG tablet Take 1 tablet (40 mg total) by mouth 2 (two) times daily.   12/02/2016 at Unknown time  . polyethylene glycol (MIRALAX / GLYCOLAX) packet Take 17 g by mouth daily as needed. 14 each 0 Past Month at Unknown time  . potassium chloride SA (K-DUR,KLOR-CON) 20 MEQ tablet Take 20-40 mEq by mouth as directed. Take 1 tablet to = 20 mEq M-W-F, take 2 tablets to = 40 mEq Tue-Thu-Sat-Sun    12/02/2016 at Unknown time  . QUEtiapine (SEROQUEL) 50 MG tablet Take 50 mg by mouth 2 (two) times daily.   12/02/2016 at Unknown time  . ranitidine (ZANTAC) 150 MG tablet Take 1 tablet (150 mg total) by mouth 2 (two) times daily. 180 tablet 1 12/02/2016 at Unknown time  . sertraline (ZOLOFT) 100 MG tablet Take 1 tablet (100 mg total) by mouth every evening. 90 tablet 1 12/01/2016 at Unknown time  . tamsulosin (FLOMAX) 0.4 MG CAPS capsule TAKE 1 CAPSULE (0.4 MG TOTAL) DAILY AFTER BREAKFAST. (Patient taking differently: Take 0.4 mg by mouth daily after breakfast. TAKE 1 CAPSULE (0.4 MG TOTAL) DAILY AFTER BREAKFAST.) 90 capsule 1 12/02/2016 at Unknown time  . torsemide (DEMADEX) 20 MG tablet Take 20 mg by mouth 2 (two) times daily.   12/02/2016 at Unknown time  . traZODone (DESYREL) 50 MG tablet Take 25-50 mg by mouth as directed. Take 50mg  every night at bedtime. May take 25mg  up to twice daily as needed for anxiety   Past Week at Unknown time  . warfarin (COUMADIN) 5 MG tablet Take 5-7.5 mg by mouth as directed. Take 5mg  Monday-Friday. Take 7.5mg  Saturday and Sunday.   12/01/2016 at Unknown time    Assessment: 76 yo M from SNF on coumadin for Afib. INR 1.54 is subtherapeutic on admission.  Patient had not been receiving prescribed dose PTA which likely explains low level.    Note: warfarin at SNF was transcribed incorrectly and pt was not getting it as intended. Pt was supposed to be on warfarin 5 mg Mon-Fr and 7.5 mg Sat/Sun but transcription error had pt on 5 mg Mon- Fri and nothing Sat/Sun. SNF  not sure how long error in place  Today, 12/07/2016:  INR is therapeutic at 2.80  Cbc relatively stable on 12/06/16  No bleeding documented  No significant-drug-drug intxns noted  Eating 10% of meals  Goal of Therapy:  INR 2-3   Plan:  - repeat warfarin 2.5mg   - Daily INR - Monitor for signs and symptoms of bleeding   Dia Sitter, PharmD, BCPS 12/07/2016 7:27 AM

## 2016-12-07 NOTE — Clinical Social Work Placement (Addendum)
   CLINICAL SOCIAL WORK PLACEMENT  NOTE  Date:  12/07/2016  Patient Details  Name: Mark [name] MRN: [id number] Date of Birth: [birth year]  Clinical Social Work is seeking post-discharge placement for this patient at the [place] level of care (*CSW will initial, date and re-position this form in  chart as items are completed):  Yes   Patient/family provided with [place] Work Department's list of facilities offering this level of care within the geographic area requested by the patient (or if unable, by the patient's family).  Yes   Patient/family informed of their freedom to choose among providers that offer the needed level of care, that participate in Medicare, Medicaid or managed care program needed by the patient, have an available bed and are willing to accept the patient.  Yes   Patient/family informed of [facility]'s ownership interest in [facility] and [facility], as well as of the fact that they are under no obligation to receive care at these facilities.  PASRR submitted to EDS on       PASRR number received on       Existing PASRR number confirmed on 12/04/16     FL2 transmitted to all facilities in geographic area requested by pt/family on 12/04/16     FL2 transmitted to all facilities within larger geographic area on       Patient informed that his/her managed care company has contracts with or will negotiate with certain facilities, including the following:        Yes   Patient/family informed of bed offers received.  Patient chooses bed at [facility]     Physician recommends and patient chooses bed at      Patient to be transferred to [facility] on 12/07/16.  Patient to be transferred to facility by       Patient family notified on 12/07/16 of transfer.  Name of family member notified:  son     PHYSICIAN       Additional Comment: Pt is ready to dc to [facility]. Pt's son is in agreement with this plan.  [facility] trasport is required. Medical necessity form completed. Son is aware out of pocket cost may be associated with [facility] transport. DC Summary sent to SNF for review. No scripts printed. # for report provided to nsg.   _______________________________________________ Luretha Rued, [place] 12/07/2016, 1:47 PM

## 2016-12-07 NOTE — Progress Notes (Signed)
Patient remains a&o self only. Stable, see flow sheet VS. IV dc 'd CN. Report called to [place], [place]. Questions, concerns denied.

## 2016-12-07 NOTE — Discharge Summary (Signed)
Physician Discharge Summary  Mark [name] XTG:[id number] DOB: 11/20/1940 DOA: 12/02/2016  PCP: [name], NP  Admit date: 12/02/2016 Discharge date: 12/07/2016  Admitted From: [name] living in rehabilitation Disposition: Skilled nursing facility with the memory unit  Recommendations for Outpatient Follow-up:  1. Follow up with PCP in 1-2 weeks 2. Low dose Metoprolol 12.5mg  orally started per Cardiology ,stop Coreg  3. Stop Torsemide, Start Lasix 40mg  orally daily for now.  4. Should have his code status discussion with his PCP.   Home Health: None Equipment/Devices: None Discharge Condition: Stable  CODE STATUS: Full  Diet recommendation: Heart Healthy  Brief/Interim Summary: 76 year old male with past medical history of Bell's hours dementia, systolic CHF, coronary artery disease, abdominal aortic aneurysm, atrial fibrillation on chronic Coumadin and COPD came to the ER with complaints of agitation. Patient was found to be respiratory failure due to acute on chronic CHF. He was started on lasix IV and later transitioned to oral. During his stay there was concern for brief pauses on telemetry therefore he was stopped from any AV nodal blocker. Cardiology was consulted who did not think it was significant enough therefore restarted on low dose of metoprolol 12.5 mg twice daily. Over the course of 3 days patient was diuresed with IV Lasix and transitioned to oral Lasix to be taken daily. His torsemide has been stopped for now. Echo done showed ef 25-30% which wasn't much different from the previous one done 3 months ago.  Today he has reached his euvolemic state and will transition him to oral Lasix. Social worker has been consulted to transfer him to the skilled nursing facility with memory unit. He still has episodes of brief agitation and some confusion but does not have any complaints. He does follow all the commands. We have tried to reach his family to discuss his goals of care  and CODE STATUS but no response yet. Looking at the previous records family wanted patient to remain full code but this needs to be addressed outpatient with his PCP. No complaints today.   Discharge Diagnoses:  Active Problems:   Hyperlipidemia   Depression   Essential hypertension   AAA (abdominal aortic aneurysm) (HCC)   Atrial fibrillation, chronic (HCC)   Acute on chronic systolic congestive heart failure (HCC)   Alzheimer's dementia with behavioral disturbance   Elevated troponin   Acute combined systolic and diastolic CHF, NYHA class 1 (HCC)  Acute systolic CHF, stable and improved -Continue daily weights and input output -Echocardiogram shows 25-30 percent. Echo in April showed ejection fraction 20-25 percent. -Evaluated by cardiology. Will transition him to oral Lasix and stop his torsemide for now. Cont other home medications.   History of atrial fibrillation -Low dose metoprolol added per cardiology. Stop Coreg -Continue Coumadin  Alzheimer's dementia with some behavioral disturbances -Evaluated by psychiatry, appreciated medical dictation recommendations -Discharge to SNF to the memory unit.   Discharge Instructions   Allergies as of 12/07/2016      Reactions   Ace Inhibitors Cough      Medication List    STOP taking these medications   carvedilol 12.5 MG tablet Commonly known as:  COREG   torsemide 20 MG tablet Commonly known as:  DEMADEX     TAKE these medications   albuterol 108 (90 Base) MCG/ACT inhaler Commonly known as:  PROVENTIL HFA;VENTOLIN HFA Inhale 2 puffs into the lungs every 6 (six) hours as needed for wheezing or shortness of breath.   atorvastatin 40 MG tablet Commonly known  as:  LIPITOR TAKE 1 TABLET ONE TIME DAILY What changed:  how much to take  how to take this  when to take this  additional instructions   Coenzyme Q10 200 MG capsule Take 200 mg by mouth daily.   Digoxin 62.5 MCG Tabs Please take 0.125 mg oral daily  for 1 days, then change to 0.0625 mg oral daily on 6/23. What changed:  how much to take  how to take this  when to take this  additional instructions   Fish Oil 1200 MG Caps Take 1,200 mg by mouth daily.   furosemide 40 MG tablet Commonly known as:  LASIX Take 1 tablet (40 mg total) by mouth daily.   isosorbide dinitrate 10 MG tablet Commonly known as:  ISORDIL Take 1 tablet (10 mg total) by mouth 3 (three) times daily.   levalbuterol 0.63 MG/3ML nebulizer solution Commonly known as:  XOPENEX Take 3 mLs (0.63 mg total) by nebulization every 6 (six) hours as needed for wheezing or shortness of breath.   MACUVITE EYE CARE PO Take 1 capsule by mouth daily.   metoprolol tartrate 25 MG tablet Commonly known as:  LOPRESSOR Take 0.5 tablets (12.5 mg total) by mouth 2 (two) times daily.   OXYGEN Inhale 3 L/min into the lungs continuous.   pantoprazole 40 MG tablet Commonly known as:  PROTONIX Take 1 tablet (40 mg total) by mouth 2 (two) times daily.   polyethylene glycol packet Commonly known as:  MIRALAX / GLYCOLAX Take 17 g by mouth daily as needed.   potassium chloride SA 20 MEQ tablet Commonly known as:  K-DUR,KLOR-CON Take 20-40 mEq by mouth as directed. Take 1 tablet to = 20 mEq M-W-F, take 2 tablets to = 40 mEq Tue-Thu-Sat-Sun   QUEtiapine 50 MG tablet Commonly known as:  SEROQUEL Take 50 mg by mouth 2 (two) times daily.   ranitidine 150 MG tablet Commonly known as:  ZANTAC Take 1 tablet (150 mg total) by mouth 2 (two) times daily.   sertraline 100 MG tablet Commonly known as:  ZOLOFT Take 1 tablet (100 mg total) by mouth every evening.   tamsulosin 0.4 MG Caps capsule Commonly known as:  FLOMAX TAKE 1 CAPSULE (0.4 MG TOTAL) DAILY AFTER BREAKFAST. What changed:  how much to take  how to take this  when to take this  additional instructions   traZODone 50 MG tablet Commonly known as:  DESYREL Take 25-50 mg by mouth as directed. Take 50mg  every  night at bedtime. May take 25mg  up to twice daily as needed for anxiety   warfarin 5 MG tablet Commonly known as:  COUMADIN Take 5-7.5 mg by mouth as directed. Take 5mg  Monday-Friday. Take 7.5mg  Saturday and Sunday.      Contact information for after-discharge care    [facility] .   Specialty:  Skilled Nursing Facility Contact information: [place] [address] [phone]             Allergies  Allergen Reactions  . Ace Inhibitors Cough    On your next visit with your primary care physician please Get Medicines reviewed and adjusted.   Please request your Prim.MD to go over all Hospital Tests and Procedure/Radiological results at the follow up, please get all Hospital records sent to your Prim MD by signing hospital release before you go home.   If you experience worsening of your admission symptoms, develop shortness of breath, life threatening emergency,  suicidal or homicidal thoughts you must seek medical attention immediately by calling 911 or calling your MD immediately  if symptoms less severe.  You Must read complete instructions/literature along with all the possible adverse reactions/side effects for all the Medicines you take and that have been prescribed to you. Take any new Medicines after you have completely understood and accpet all the possible adverse reactions/side effects.   Do not drive, operate heavy machinery, perform activities at heights, swimming or participation in water activities or provide baby sitting services if your were admitted for syncope or siezures until you have seen by Primary MD or a Neurologist and advised to do so again.  Do not drive when taking Pain medications.    Do not take more than prescribed Pain, Sleep and Anxiety Medications  Special Instructions: If you have smoked or chewed Tobacco  in the last 2 yrs please stop smoking, stop any regular Alcohol  and or  any Recreational drug use.  Wear Seat belts while driving.   Please note  You were cared for by a hospitalist during your hospital stay. If you have any questions about your discharge medications or the care you received while you were in the hospital after you are discharged, you can call the unit and asked to speak with the hospitalist on call if the hospitalist that took care of you is not available. Once you are discharged, your primary care physician will handle any further medical issues. Please note that NO REFILLS for any discharge medications will be authorized once you are discharged, as it is imperative that you return to your primary care physician (or establish a relationship with a primary care physician if you do not have one) for your aftercare needs so that they can reassess your need for medications and monitor your lab values.   Increase activity slowly        Consultations:  Cardiology    Procedures/Studies: Dg Chest 2 View  Result Date: 12/02/2016 CLINICAL DATA:  Shortness of breath.  History of CHF. EXAM: CHEST  2 VIEW COMPARISON:  Chest radiograph 10/17/2016 FINDINGS: Monitoring leads overlie the patient. Stable cardiomegaly. Pulmonary vascular redistribution and mild interstitial opacities bilaterally. Small bilateral pleural effusions. No pneumothorax. Thoracic spine degenerative changes. Suspect skin fold projecting over the lower right hemithorax. IMPRESSION: Cardiomegaly, pulmonary vascular redistribution and mild interstitial edema. Small bilateral pleural effusions. Electronically Signed   By: Lovey Newcomer M.D.   On: 12/02/2016 16:54       Subjective:   Discharge Exam: Vitals:   12/07/16 0159 12/07/16 0509  BP: (!) 164/101 (!) 155/127  Pulse: (!) 101 96  Resp:  20  Temp:  97.7 F (36.5 C)   Vitals:   12/06/16 2131 12/06/16 2343 12/07/16 0159 12/07/16 0509  BP: (!) 188/121 (!) 155/122 (!) 164/101 (!) 155/127  Pulse:   (!) 101 96  Resp:    20   Temp:    97.7 F (36.5 C)  TempSrc:    Oral  SpO2:   93% 94%  Weight:    89.7 kg (197 lb 12 oz)  Height:        General: Pt is alert, awake, not in acute distress. pleasantly confused AAOx2. Follows commands Cardiovascular: RRR, S1/S2 +, no rubs, no gallops Respiratory: CTA bilaterally, no wheezing, no rhonchi Abdominal: Soft, NT, ND, bowel sounds + Extremities: no edema, no cyanosis    The results of significant diagnostics from this hospitalization (including imaging, microbiology, ancillary and laboratory) are listed  below for reference.     Microbiology: Recent Results (from the past 240 hour(s))  MRSA PCR Screening     Status: None   Collection Time: 12/02/16 10:21 PM  Result Value Ref Range Status   MRSA by PCR NEGATIVE NEGATIVE Final    Comment:        The GeneXpert MRSA Assay (FDA approved for NASAL specimens only), is one component of a comprehensive MRSA colonization surveillance program. It is not intended to diagnose MRSA infection nor to guide or monitor treatment for MRSA infections.      Labs: BNP (last 3 results)  Recent Labs  12/02/16 1651 12/04/16 0626 12/07/16 0852  BNP 2,238.5* 3,613.9* 5,625.6*   Basic Metabolic Panel:  Recent Labs Lab 12/03/16 0127 12/03/16 1106 12/04/16 0626 12/05/16 0454 12/06/16 0509 12/07/16 0446  NA 143 143 144 143 144 143  K 3.5 3.4* 3.8 3.7 3.7 4.6  CL 107 108 107 105 106 105  CO2 25 26 27 29 28 26   GLUCOSE 134* 102* 112* 118* 104* 104*  BUN 31* 29* 30* 33* 31* 32*  CREATININE 2.25* 2.19* 2.24* 2.19* 2.16* 2.21*  CALCIUM 8.7* 8.6* 8.7* 8.7* 8.8* 8.8*  MG 2.1  --   --   --   --  2.3  PHOS 4.0  --   --   --   --   --    Liver Function Tests:  Recent Labs Lab 12/02/16 1651 12/03/16 0127  AST 22 24  ALT 8* 10*  ALKPHOS 87 79  BILITOT 1.4* 1.7*  PROT 6.6 6.2*  ALBUMIN 3.4* 3.2*   No results for input(s): LIPASE, AMYLASE in the last 168 hours. No results for input(s): AMMONIA in the last 168  hours. CBC:  Recent Labs Lab 12/02/16 1651 12/03/16 0127 12/04/16 0626 12/05/16 0454 12/06/16 0509  WBC 4.4 4.9 3.5* 5.2 5.2  HGB 8.8* 8.5* 8.7* 8.5* 9.0*  HCT 28.2* 27.5* 28.3* 27.9* 30.0*  MCV 86.5 86.5 86.5 88.0 88.2  PLT 196 193 178 201 194   Cardiac Enzymes:  Recent Labs Lab 12/03/16 0127 12/03/16 0713 12/03/16 1106 12/03/16 1613 12/03/16 2310  TROPONINI 0.07* 0.09* 0.14* 0.23* 0.21*   BNP: Invalid input(s): POCBNP CBG: No results for input(s): GLUCAP in the last 168 hours. D-Dimer No results for input(s): DDIMER in the last 72 hours. Hgb A1c No results for input(s): HGBA1C in the last 72 hours. Lipid Profile No results for input(s): CHOL, HDL, LDLCALC, TRIG, CHOLHDL, LDLDIRECT in the last 72 hours. Thyroid function studies No results for input(s): TSH, T4TOTAL, T3FREE, THYROIDAB in the last 72 hours.  Invalid input(s): FREET3 Anemia work up No results for input(s): VITAMINB12, FOLATE, FERRITIN, TIBC, IRON, RETICCTPCT in the last 72 hours. Urinalysis    Component Value Date/Time   COLORURINE YELLOW 12/02/2016 1946   APPEARANCEUR CLEAR 12/02/2016 1946   LABSPEC 1.006 12/02/2016 1946   PHURINE 7.0 12/02/2016 1946   GLUCOSEU NEGATIVE 12/02/2016 1946   GLUCOSEU NEGATIVE 07/17/2014 0945   HGBUR NEGATIVE 12/02/2016 1946   BILIRUBINUR NEGATIVE 12/02/2016 1946   KETONESUR NEGATIVE 12/02/2016 1946   PROTEINUR NEGATIVE 12/02/2016 1946   UROBILINOGEN 0.2 07/17/2014 0945   NITRITE NEGATIVE 12/02/2016 1946   LEUKOCYTESUR NEGATIVE 12/02/2016 1946   Sepsis Labs Invalid input(s): PROCALCITONIN,  WBC,  LACTICIDVEN Microbiology Recent Results (from the past 240 hour(s))  MRSA PCR Screening     Status: None   Collection Time: 12/02/16 10:21 PM  Result Value Ref Range Status   MRSA by  PCR NEGATIVE NEGATIVE Final    Comment:        The GeneXpert MRSA Assay (FDA approved for NASAL specimens only), is one component of a comprehensive MRSA  colonization surveillance program. It is not intended to diagnose MRSA infection nor to guide or monitor treatment for MRSA infections.      Time coordinating discharge: Over 30 minutes  SIGNED:   Damita Lack, MD  [facility] 12/07/2016, 11:57 AM Pager   If 7PM-7AM, please contact night-coverage [email or web address] Password [email or web address]

## 2016-12-14 ENCOUNTER — Encounter: Payer: Self-pay | Admitting: Cardiology

## 2016-12-22 ENCOUNTER — Ambulatory Visit (INDEPENDENT_AMBULATORY_CARE_PROVIDER_SITE_OTHER): Payer: Medicare HMO | Admitting: Family

## 2016-12-22 ENCOUNTER — Encounter: Payer: Self-pay | Admitting: Family

## 2016-12-22 ENCOUNTER — Other Ambulatory Visit (HOSPITAL_COMMUNITY): Payer: Self-pay | Admitting: Internal Medicine

## 2016-12-22 VITALS — BP 121/77 | HR 93 | Temp 98.5°F | Resp 18 | Ht 72.0 in | Wt 204.8 lb

## 2016-12-22 DIAGNOSIS — G309 Alzheimer's disease, unspecified: Secondary | ICD-10-CM | POA: Diagnosis not present

## 2016-12-22 DIAGNOSIS — F0280 Dementia in other diseases classified elsewhere without behavioral disturbance: Secondary | ICD-10-CM | POA: Diagnosis not present

## 2016-12-22 DIAGNOSIS — I509 Heart failure, unspecified: Secondary | ICD-10-CM | POA: Diagnosis not present

## 2016-12-22 DIAGNOSIS — R1310 Dysphagia, unspecified: Secondary | ICD-10-CM

## 2016-12-22 NOTE — Progress Notes (Signed)
Subjective:    Patient ID: Mark [name], male    DOB: 1940-07-07, 76 y.o.   MRN: [id number]  HPI  Mr. Freimark is a 76 yr old male with dementia who presents today for follow up. He is currently in a memory unit at a SNF.    He was admitted 7/28-12/07/16 due to acute on chronic chf.  He was diuresed during his admission with IV lasix.  He was transitioned to oral lasix upon discharge. He denies sob.    Wt Readings from Last 3 Encounters:  12/22/16 204 lb 12.8 oz (92.9 kg)  12/07/16 197 lb 12 oz (89.7 kg)  11/27/16 208 lb (94.3 kg)    Review of Systems See HPI  Past Medical History:  Diagnosis Date  . AAA (abdominal aortic aneurysm) (HCC)    9.1 cm repair 05/2009 ( ENDOVASCULAR REPAIR ).  MORE RECENT RE-EVALUATION OF ENLARGING AAA BY DR. Trula Slade AND BY DR. Kathlene Cote  MAY 2015 - PT STATES HE UNDERSTANDS THAT NOTHING IS LEAKING AT PRESENT TIME AND HE IS TO FOLLOW UP WITH HIS DOCTORS FOR FUTURE MONITORING I  . Arthritis    LEFT HAND, RT KNEE (08/14/2016)  . Basal cell carcinoma    "right side of my nose; think they burned it off" (08/14/2016)  . CAD (coronary artery disease)    a. s/p PCI to LAD/diagonal, subtotal of PDA 2006. b. Negative nuc 2014.  Marland Kitchen Chronic atrial fibrillation ([place])   . Chronic systolic CHF (congestive heart failure) (HCC)    a. EF 20-25% dx in 08/2016; etiology not yet clear.  . CKD (chronic kidney disease), stage III    a. stage III-IV, Cr baseline appears 1.8-2.0  . COPD (chronic obstructive pulmonary disease) ([place])   . Depression   . GERD (gastroesophageal reflux disease)   . History of kidney stones   . History of pneumothorax    LONG TIME AGO  . Hyperlipidemia   . Hypertension   . Migraine    "very rare the last 20 years" (08/14/2016)  . Myocardial infarction ([place]) 2006  . Osteoarthritis   . Renal cell carcinoma ([place])   . Shingles outbreak    PT NOTICED SKIN RASH RT GROIN AND RIGHT TRUNK ON MONDAY 10/27/13 - NOT A LOT OF DISCOMFORT- NOT ON ANY ORAL MEDS TO  TREAT.  . Sleep apnea    "dx'd; didn't try mask" (08/14/2016)  . Stroke [facility])    "CT showed I've had old strokes; never knew about it before today" (08/14/2016)     Social History   Social History  . Marital status: Divorced    Spouse name: N/A  . Number of children: 2  . Years of education: N/A   Occupational History  .  Retired   Social History Main Topics  . Smoking status: Former Smoker    Packs/day: 1.50    Years: 23.00    Types: Cigarettes    Quit date: 08/24/1980  . Smokeless tobacco: Never Used  . Alcohol use 1.5 oz/week    3 Standard drinks or equivalent per week     Comment: 08/14/2016 "maybe 2-3 drinks/month average"  . Drug use: No  . Sexual activity: Not on file   Other Topics Concern  . Not on file   Social History Narrative   Retired   Divorced   2 sons 88 &30     Occupation:  Part time sports broadcaster    Smoking Status:  quit (08/24/1980)   Packs/Day:  1.0  Caffeine use/day:  3 beverages daily    Does Patient Exercise:  no   Alcohol Use - yes    Past Surgical History:  Procedure Laterality Date  . ABDOMINAL AORTAGRAM N/A 09/24/2013   Procedure: ABDOMINAL Maxcine Ham;  Surgeon: Serafina Mitchell, MD;  Location[facility] CATH LAB;  Service: Cardiovascular;  Laterality: N/A;  . ABDOMINAL AORTIC ANEURYSM REPAIR  JAN 2011   ENDOVASCULAR - DR. BRABHAM  . CARDIAC CATHETERIZATION  07/04/2004   Archie Endo 07/04/2004  . COLONOSCOPY  latest 2004   Dr Sammuel Cooper.  reported in 11/11/2008 PMD note as normal   . CORONARY ANGIOPLASTY WITH STENT PLACEMENT  07/05/2004   notes 07/05/2004  . ESOPHAGOGASTRODUODENOSCOPY N/A 10/12/2016   Procedure: ESOPHAGOGASTRODUODENOSCOPY (EGD);  Surgeon: Doran Stabler, MD;  Location[facility] ENDOSCOPY;  Service: Endoscopy;  Laterality: N/A;  bedside procedure in [facility] [place]  . KNEE ARTHROSCOPY Right   . LITHOTRIPSY    . ROBOT ASSISTED LAPAROSCOPIC NEPHRECTOMY Left 11/06/2013   Procedure: ROBOTIC ASSISTED LAPAROSCOPIC RADICAL  NEPHRECTOMY;  Surgeon:  Alexis Frock, MD;  Location: [facility] ORS;  Service: Urology;  Laterality: Left;  . TONSILLECTOMY  1964    Family History  Problem Relation Age of Onset  . Pulmonary embolism Mother        died of PE  . Heart disease Mother        before age 48  . Anuerysm Father        history of popliteal  . Coronary artery disease Unknown   . Hyperlipidemia Unknown   . Hypertension Unknown   . Prostate cancer Unknown   . Other Neg Hx        polycystic kidney disease, and colon cancer  . Colon cancer Neg Hx     Allergies  Allergen Reactions  . Ace Inhibitors Cough    Current Outpatient Prescriptions on File Prior to Visit  Medication Sig Dispense Refill  . albuterol (PROVENTIL HFA;VENTOLIN HFA) 108 (90 Base) MCG/ACT inhaler Inhale 2 puffs into the lungs every 6 (six) hours as needed for wheezing or shortness of breath. 1 Inhaler 0  . atorvastatin (LIPITOR) 40 MG tablet TAKE 1 TABLET ONE TIME DAILY (Patient taking differently: Take 40 mg by mouth daily at 6 PM. TAKE 1 TABLET ONE TIME DAILY) 90 tablet 1  . Coenzyme Q10 200 MG capsule Take 200 mg by mouth daily.    . furosemide (LASIX) 40 MG tablet Take 1 tablet (40 mg total) by mouth daily. 30 tablet 0  . isosorbide dinitrate (ISORDIL) 10 MG tablet Take 1 tablet (10 mg total) by mouth 3 (three) times daily.    Marland Kitchen levalbuterol (XOPENEX) 0.63 MG/3ML nebulizer solution Take 3 mLs (0.63 mg total) by nebulization every 6 (six) hours as needed for wheezing or shortness of breath. 3 mL 12  . metoprolol tartrate (LOPRESSOR) 25 MG tablet Take 0.5 tablets (12.5 mg total) by mouth 2 (two) times daily. 60 tablet 0  . Multiple Vitamins-Minerals (MACUVITE EYE CARE PO) Take 1 capsule by mouth daily.    . Omega-3 Fatty Acids (FISH OIL) 1200 MG CAPS Take 1,200 mg by mouth daily.     . OXYGEN Inhale 3 L/min into the lungs continuous.    . pantoprazole (PROTONIX) 40 MG tablet Take 1 tablet (40 mg total) by mouth 2 (two) times daily.    . polyethylene glycol (MIRALAX /  GLYCOLAX) packet Take 17 g by mouth daily as needed. 14 each 0  . potassium chloride SA (K-DUR,KLOR-CON) 20 MEQ  tablet Take 20-40 mEq by mouth as directed. Take 1 tablet to = 20 mEq M-W-F, take 2 tablets to = 40 mEq Tue-Thu-Sat-Sun     . QUEtiapine (SEROQUEL) 50 MG tablet Take 50 mg by mouth 2 (two) times daily.    . ranitidine (ZANTAC) 150 MG tablet Take 1 tablet (150 mg total) by mouth 2 (two) times daily. 180 tablet 1  . sertraline (ZOLOFT) 100 MG tablet Take 1 tablet (100 mg total) by mouth every evening. 90 tablet 1  . tamsulosin (FLOMAX) 0.4 MG CAPS capsule TAKE 1 CAPSULE (0.4 MG TOTAL) DAILY AFTER BREAKFAST. (Patient taking differently: Take 0.4 mg by mouth daily after breakfast. TAKE 1 CAPSULE (0.4 MG TOTAL) DAILY AFTER BREAKFAST.) 90 capsule 1  . traZODone (DESYREL) 50 MG tablet Take 25-50 mg by mouth as directed. Take 50mg  every night at bedtime. May take 25mg  up to twice daily as needed for anxiety    . warfarin (COUMADIN) 5 MG tablet Take 5-7.5 mg by mouth as directed. Take 5mg  Monday-Friday. Take 7.5mg  Saturday and Sunday.    . digoxin 62.5 MCG TABS Please take 0.125 mg oral daily for 1 days, then change to 0.0625 mg oral daily on 6/23. (Patient taking differently: Take 0.0625 mg by mouth daily. Please take 0.125 mg oral daily for 1 days, then change to 0.0625 mg oral daily on 6/23.)     No current facility-administered medications on file prior to visit.     BP 121/77 (BP Location: Right Arm, Cuff Size: Normal)   Pulse 93   Temp 98.5 F (36.9 C) (Oral)   Resp 18   Ht 6' (1.829 m)   Wt 204 lb 12.8 oz (92.9 kg)   SpO2 97%   BMI 27.78 kg/m       Objective:   Physical Exam  Constitutional: He is oriented to person, place, and time. He appears well-developed and well-nourished. No distress.  Slumped forward in wheel chair.  Easily arousable   HENT:  Head: Normocephalic and atraumatic.  Cardiovascular: Normal rate and regular rhythm.   No murmur heard. Pulmonary/Chest:  Effort normal and breath sounds normal. No respiratory distress. He has no wheezes. He has no rales.  Musculoskeletal:  3+ bilateral LE edema  Neurological: He is alert and oriented to person, place, and time.  Skin: Skin is warm and dry.  Psychiatric: His speech is tangential.  Oriented to self.            Assessment & Plan:  Left message for HCPOA re: code status.   CHF exacerbation-  Appears volume overloaded.  It appears that he should have been d/c'd on digoxen but this is not on the [facility] Will ask SNF to start dig. Also, will ask SNF as follows:   Increase furosemide to 40mg  twice daily x 3 days.  Weigh patient daily (call Monday with weight) Check BMET on Monday and fax results to [phone]. I will arrange a follow up with his cardiologist.   Dementia- seems to be rapidly progressing. He is much less able to converse now than he was several months ago.

## 2016-12-22 NOTE — Patient Instructions (Addendum)
Start digoxen 62.5 mcg once daily. Increase furosemide to 40mg  twice daily x 3 days.  Weigh patient daily (call Monday with weight) Check BMET on Monday and fax results to [phone].

## 2016-12-24 DIAGNOSIS — M5440 Lumbago with sciatica, unspecified side: Secondary | ICD-10-CM | POA: Diagnosis not present

## 2016-12-24 DIAGNOSIS — M25551 Pain in right hip: Secondary | ICD-10-CM | POA: Diagnosis not present

## 2016-12-25 ENCOUNTER — Telehealth: Payer: Self-pay | Admitting: *Deleted

## 2016-12-25 ENCOUNTER — Telehealth: Payer: Self-pay | Admitting: Family

## 2016-12-25 DIAGNOSIS — Z0000 Encounter for general adult medical examination without abnormal findings: Secondary | ICD-10-CM | POA: Diagnosis not present

## 2016-12-25 NOTE — Telephone Encounter (Signed)
Melissa--please see below message re: call from [place].

## 2016-12-25 NOTE — Telephone Encounter (Signed)
Noted faxed to below number. Unable to verify receipt by facility tonight. Will check again tomorrow.

## 2016-12-25 NOTE — Telephone Encounter (Signed)
Continue bid dosing and daily weights until he follows back up with [place] on 01/01/17. Can you please make sure they check a bmet is drawn in next 2 days or so please and fax [place] results.  Call us if further weight gain between now and his follow up.

## 2016-12-25 NOTE — Telephone Encounter (Signed)
Pt's POA Lelan Pons returned call. She said it was from PCP.    CB #: [id number]

## 2016-12-25 NOTE — Telephone Encounter (Signed)
Pilar Jarvis  [phone]  Nursing home staff returned call from [facility]

## 2016-12-25 NOTE — Telephone Encounter (Signed)
Spoke with Enid Derry on the [place] Unit at [phone] [facility]) to ensure orders were received on Friday for increase of lasix, restart digoxen, weigh daily and call with weight due to his CHF. She states orders were received and meds were adjusted per order. Pt's weight on Saturday was 206, Sunday 205 and today 205. She requests that we fax future orders to [phone].  Please advise if further recommendations as weight has not gone down since Friday?

## 2016-12-26 ENCOUNTER — Encounter: Payer: Self-pay | Admitting: Family

## 2016-12-26 NOTE — Telephone Encounter (Signed)
Spoke with Jenny Reichmann on the [place] unit and he is unable to confirm that orders from last night were received. Gave verbal to Jenny Reichmann and he transferred me to the [employer] Maudie Mercury or Diane). Spoke with Maudie Mercury about having bmet drawn. Advised her to have results faxed to nurse station to my attn.  Awaiting result.

## 2016-12-27 ENCOUNTER — Ambulatory Visit (HOSPITAL_COMMUNITY)
Admission: RE | Admit: 2016-12-27 | Discharge: 2016-12-27 | Disposition: A | Discharge status: Home / Self Care | Payer: Medicare HMO | Source: Ambulatory Visit | Attending: Internal Medicine | Admitting: Internal Medicine

## 2016-12-27 DIAGNOSIS — I4891 Unspecified atrial fibrillation: Secondary | ICD-10-CM | POA: Diagnosis not present

## 2016-12-27 DIAGNOSIS — F329 Major depressive disorder, single episode, unspecified: Secondary | ICD-10-CM | POA: Diagnosis not present

## 2016-12-27 DIAGNOSIS — I10 Essential (primary) hypertension: Secondary | ICD-10-CM | POA: Diagnosis not present

## 2016-12-27 DIAGNOSIS — R1310 Dysphagia, unspecified: Secondary | ICD-10-CM | POA: Diagnosis not present

## 2016-12-27 DIAGNOSIS — I509 Heart failure, unspecified: Secondary | ICD-10-CM | POA: Diagnosis not present

## 2016-12-27 DIAGNOSIS — R1312 Dysphagia, oropharyngeal phase: Secondary | ICD-10-CM | POA: Insufficient documentation

## 2016-12-27 DIAGNOSIS — F0390 Unspecified dementia without behavioral disturbance: Secondary | ICD-10-CM | POA: Diagnosis not present

## 2016-12-27 DIAGNOSIS — R7989 Other specified abnormal findings of blood chemistry: Secondary | ICD-10-CM | POA: Diagnosis not present

## 2016-12-27 DIAGNOSIS — R05 Cough: Secondary | ICD-10-CM | POA: Diagnosis not present

## 2016-12-27 DIAGNOSIS — E785 Hyperlipidemia, unspecified: Secondary | ICD-10-CM | POA: Insufficient documentation

## 2016-12-27 DIAGNOSIS — G309 Alzheimer's disease, unspecified: Secondary | ICD-10-CM | POA: Insufficient documentation

## 2016-12-27 DIAGNOSIS — I714 Abdominal aortic aneurysm, without rupture: Secondary | ICD-10-CM | POA: Insufficient documentation

## 2016-12-27 NOTE — Progress Notes (Addendum)
  Clinical Impressions from Outpatient MBS 12-27-16:   Pt referred for outpatient MBS from memory care unit at [facility]. Baseline diet unknown from accompanying caregiver this date. Pt presents with a mild oral and mild to moderate pharyngeal dysphagia with motor and sensory involvements. Dysphagia etiology likely mulitfactorial as medical hx signficant for remote CVA of bilateral basal ganglia, dementia, and COPD. Cognitive deficits also put pt at increased risk for aspiration as occassional impulsivity noted during PO consumption.   Pt with intermittent oral holding of liquids. Piece meal swallow exhibited across consistencies as well as delay in swallow initiation. Mistimed swallow initiation and reduced laryngeal closure resulted in inconsistent aspiration of thin liquids before the swallow that was primarily unsensed. When pt was cued to cough, aspirates were unable to be expelled from the trachea. Aspiration occured intermittently with thin liquids both by cup and teaspoon. Remaining consistencies trialed without penetration or aspiration including nectar thick liquids, puree, and solids. Recommend dysphagia 3 (mechanical soft) and nectar thick liquids with medicines whole in puree. ST follow up at SNF is indicated for dysphagia management. Recommend [employer] Protocol: supervised cup sips of water in between meals after oral care per MD and ST discretion.     SLP Visit Diagnosis Dysphagia, oropharyngeal phase (R13.12)  Impact on safety and function Mild aspiration risk;Moderate aspiration risk   Arvil Chaco MA, CCC-SLP Acute Care Speech Language Pathologist

## 2016-12-28 DIAGNOSIS — I509 Heart failure, unspecified: Secondary | ICD-10-CM | POA: Diagnosis not present

## 2016-12-28 DIAGNOSIS — N400 Enlarged prostate without lower urinary tract symptoms: Secondary | ICD-10-CM | POA: Diagnosis not present

## 2016-12-28 DIAGNOSIS — F322 Major depressive disorder, single episode, severe without psychotic features: Secondary | ICD-10-CM | POA: Diagnosis not present

## 2016-12-28 DIAGNOSIS — I4891 Unspecified atrial fibrillation: Secondary | ICD-10-CM | POA: Diagnosis not present

## 2016-12-28 DIAGNOSIS — J449 Chronic obstructive pulmonary disease, unspecified: Secondary | ICD-10-CM | POA: Diagnosis not present

## 2016-12-28 DIAGNOSIS — R2689 Other abnormalities of gait and mobility: Secondary | ICD-10-CM | POA: Diagnosis not present

## 2016-12-28 DIAGNOSIS — F0391 Unspecified dementia with behavioral disturbance: Secondary | ICD-10-CM | POA: Diagnosis not present

## 2016-12-28 DIAGNOSIS — D649 Anemia, unspecified: Secondary | ICD-10-CM | POA: Diagnosis not present

## 2016-12-28 DIAGNOSIS — N184 Chronic kidney disease, stage 4 (severe): Secondary | ICD-10-CM | POA: Diagnosis not present

## 2016-12-28 NOTE — Telephone Encounter (Signed)
Late entry. Spoke to [employer] on 12/26/16.   She states that family requests DNR. Told her that I will confirm with Thurmond Butts primary HCPOA then will fill form to send with him when I see him back in the office. Attempted to reach [place] today- no answer. Will try again later.

## 2017-01-01 ENCOUNTER — Emergency Department (HOSPITAL_COMMUNITY): Payer: Medicare HMO

## 2017-01-01 ENCOUNTER — Ambulatory Visit: Payer: Self-pay | Admitting: Family

## 2017-01-01 ENCOUNTER — Telehealth: Payer: Self-pay | Admitting: *Deleted

## 2017-01-01 ENCOUNTER — Encounter (HOSPITAL_COMMUNITY): Payer: Self-pay | Admitting: Emergency Medicine

## 2017-01-01 ENCOUNTER — Emergency Department (HOSPITAL_COMMUNITY)
Admission: EM | Admit: 2017-01-01 | Discharge: 2017-01-02 | Disposition: A | Discharge status: Home / Self Care | Payer: Medicare HMO | Attending: Emergency Medicine | Admitting: Emergency Medicine

## 2017-01-01 DIAGNOSIS — R569 Unspecified convulsions: Secondary | ICD-10-CM | POA: Diagnosis not present

## 2017-01-01 DIAGNOSIS — W010XXA Fall on same level from slipping, tripping and stumbling without subsequent striking against object, initial encounter: Secondary | ICD-10-CM | POA: Insufficient documentation

## 2017-01-01 DIAGNOSIS — N183 Chronic kidney disease, stage 3 (moderate): Secondary | ICD-10-CM | POA: Diagnosis not present

## 2017-01-01 DIAGNOSIS — Z85828 Personal history of other malignant neoplasm of skin: Secondary | ICD-10-CM | POA: Insufficient documentation

## 2017-01-01 DIAGNOSIS — J449 Chronic obstructive pulmonary disease, unspecified: Secondary | ICD-10-CM | POA: Diagnosis not present

## 2017-01-01 DIAGNOSIS — Y9389 Activity, other specified: Secondary | ICD-10-CM | POA: Insufficient documentation

## 2017-01-01 DIAGNOSIS — I252 Old myocardial infarction: Secondary | ICD-10-CM | POA: Insufficient documentation

## 2017-01-01 DIAGNOSIS — Z79899 Other long term (current) drug therapy: Secondary | ICD-10-CM | POA: Insufficient documentation

## 2017-01-01 DIAGNOSIS — I5041 Acute combined systolic (congestive) and diastolic (congestive) heart failure: Secondary | ICD-10-CM | POA: Insufficient documentation

## 2017-01-01 DIAGNOSIS — Z87891 Personal history of nicotine dependence: Secondary | ICD-10-CM | POA: Diagnosis not present

## 2017-01-01 DIAGNOSIS — Z0000 Encounter for general adult medical examination without abnormal findings: Secondary | ICD-10-CM | POA: Diagnosis not present

## 2017-01-01 DIAGNOSIS — R51 Headache: Secondary | ICD-10-CM | POA: Diagnosis present

## 2017-01-01 DIAGNOSIS — Y92129 Unspecified place in nursing home as the place of occurrence of the external cause: Secondary | ICD-10-CM | POA: Insufficient documentation

## 2017-01-01 DIAGNOSIS — I130 Hypertensive heart and chronic kidney disease with heart failure and stage 1 through stage 4 chronic kidney disease, or unspecified chronic kidney disease: Secondary | ICD-10-CM | POA: Insufficient documentation

## 2017-01-01 DIAGNOSIS — G4489 Other headache syndrome: Secondary | ICD-10-CM | POA: Diagnosis not present

## 2017-01-01 DIAGNOSIS — S0990XA Unspecified injury of head, initial encounter: Secondary | ICD-10-CM

## 2017-01-01 DIAGNOSIS — F329 Major depressive disorder, single episode, unspecified: Secondary | ICD-10-CM | POA: Diagnosis not present

## 2017-01-01 DIAGNOSIS — Z85528 Personal history of other malignant neoplasm of kidney: Secondary | ICD-10-CM | POA: Insufficient documentation

## 2017-01-01 DIAGNOSIS — I2510 Atherosclerotic heart disease of native coronary artery without angina pectoris: Secondary | ICD-10-CM | POA: Insufficient documentation

## 2017-01-01 DIAGNOSIS — Z8673 Personal history of transient ischemic attack (TIA), and cerebral infarction without residual deficits: Secondary | ICD-10-CM | POA: Insufficient documentation

## 2017-01-01 DIAGNOSIS — Y998 Other external cause status: Secondary | ICD-10-CM | POA: Diagnosis not present

## 2017-01-01 NOTE — Telephone Encounter (Signed)
Pt was scheduled to see PCP today but appt note indicates nursing facility called and cancelled his appt "due to fever". Per verbal from PCP, we need to contact nursing home to make sure pt is evaluated by someone today and has close follow up re: CHF. We also never received bmet that was ordered on 12/22/16. PCP wanted [place] to obtain pt's weight today as well. Called and spoke with nurse, Mark [name] and inquired about fever and was pt going to be evaluated by someone today. She did not address fever and states they were told that pt could not have 2 PCPS and the facility Provider was going to be managing his care and they are referring him to another cardiologist. She then stated that I would need to speak with the ADON, Mark [name] about pt's PCP going forward. Per PCP,  Mark Castle NP; she would like to speak with nursing facility Doctor to coordinate / hand off pt's care. Gave PCP cell# to Mark [name] to pass to PCP at facility. She states ADON will also need to call us back. Awaiting call.

## 2017-01-01 NOTE — Telephone Encounter (Signed)
I left a message for his son to call me back as well.  I too spoke to shirley who told me that MD saw pt on Thursday.  She told me that patient did not have a fever today.

## 2017-01-01 NOTE — ED Triage Notes (Addendum)
Pt from [place] [facility] by EMS. Facility reports a fall earlier today, unknown seizure activity. No hx of seizures or seizure medication. Pt hit the back of his head and takes Warfarin. No LOC per staff. Pt was reporting pain in the back of his head and back. EMS VS 125/86, HR 99, SpO2 992% on 3L [place] which is what he wears daily. CBG 142. Pt is baseline dementia/garbled speech. Unable to ask assessment questions d/t poor attention/concentration.

## 2017-01-01 NOTE — ED Notes (Signed)
Patient transported to CT 

## 2017-01-01 NOTE — ED Provider Notes (Signed)
[facility] DEPT Provider Note   CSN: [id number] Arrival date & time: 01/01/17  2054     History   Chief Complaint Chief Complaint  Patient presents with  . Fall    HPI Mark [name] is a 76 y.o. male.  Patient brought to the emergency department by ambulance from skilled nursing facility. Patient reportedly fell earlier today. There was no loss of consciousness per staff. Patient reportedly complained of headache and possibly back pain after the fall. At arrival to the emergency room, patient is unable to answer questions appropriately, which is apparently his normal baseline mental status secondary to dementia.  Level V Caveat due to dementia.      Past Medical History:  Diagnosis Date  . AAA (abdominal aortic aneurysm) (HCC)    9.1 cm repair 05/2009 ( ENDOVASCULAR REPAIR ).  MORE RECENT RE-EVALUATION OF ENLARGING AAA BY DR. Trula Slade AND BY DR. Kathlene Cote  MAY 2015 - PT STATES HE UNDERSTANDS THAT NOTHING IS LEAKING AT PRESENT TIME AND HE IS TO FOLLOW UP WITH HIS DOCTORS FOR FUTURE MONITORING I  . Arthritis    LEFT HAND, RT KNEE (08/14/2016)  . Basal cell carcinoma    "right side of my nose; think they burned it off" (08/14/2016)  . CAD (coronary artery disease)    a. s/p PCI to LAD/diagonal, subtotal of PDA 2006. b. Negative nuc 2014.  Marland Kitchen Chronic atrial fibrillation ([place])   . Chronic systolic CHF (congestive heart failure) (HCC)    a. EF 20-25% dx in 08/2016; etiology not yet clear.  . CKD (chronic kidney disease), stage III    a. stage III-IV, Cr baseline appears 1.8-2.0  . COPD (chronic obstructive pulmonary disease) ([place])   . Depression   . GERD (gastroesophageal reflux disease)   . History of kidney stones   . History of pneumothorax    LONG TIME AGO  . Hyperlipidemia   . Hypertension   . Migraine    "very rare the last 20 years" (08/14/2016)  . Myocardial infarction ([place]) 2006  . Osteoarthritis   . Renal cell carcinoma ([place])   . Shingles outbreak    PT NOTICED  SKIN RASH RT GROIN AND RIGHT TRUNK ON MONDAY 10/27/13 - NOT A LOT OF DISCOMFORT- NOT ON ANY ORAL MEDS TO TREAT.  . Sleep apnea    "dx'd; didn't try mask" (08/14/2016)  . Stroke [facility])    "CT showed I've had old strokes; never knew about it before today" (08/14/2016)    Patient Active Problem List   Diagnosis Date Noted  . Elevated troponin 12/02/2016  . Acute combined systolic and diastolic CHF, NYHA class 1 ([place]) 12/02/2016  . Pulmonary edema   . GI bleed 10/11/2016  . Subacute delirium 10/07/2016  . Cerebrovascular accident (CVA) ([place])   . AKI (acute kidney injury) ([place]) 10/06/2016  . Alzheimer's dementia with behavioral disturbance   . Hallucinations   . LBBB (left bundle branch block)   . Acute on chronic systolic congestive heart failure ([place]) 08/16/2016  . Acute encephalopathy 08/14/2016  . Acute on chronic respiratory failure with hypoxia ([place]) 08/14/2016  . Asthma 03/24/2016  . Preventative health care 09/22/2015  . Osteopenia 09/07/2014  . Osteoarthritis, hand 04/09/2014  . Normocytic anemia 04/09/2014  . OSA (obstructive sleep apnea) 01/08/2014  . Daytime somnolence 11/20/2013  . Renal cell carcinoma ([place]) 11/06/2013  . AAA (abdominal aortic aneurysm) without rupture ([place]) 09/22/2013  . Atrial fibrillation, chronic ([place]) 07/25/2012  . ED (erectile dysfunction) of organic origin  05/11/2011  . BPH (benign prostatic hyperplasia) 12/21/2009  . AAA (abdominal aortic aneurysm) ([place]) 05/28/2009  . Disorder resulting from impaired renal function 05/28/2009  . Hyperlipidemia 11/11/2008  . Depression 11/11/2008  . Essential hypertension 11/11/2008  . Coronary atherosclerosis 11/11/2008  . GERD 11/11/2008  . SKIN CANCER, HX OF 11/11/2008  . NEPHROLITHIASIS, HX OF 11/11/2008    Past Surgical History:  Procedure Laterality Date  . ABDOMINAL AORTAGRAM N/A 09/24/2013   Procedure: ABDOMINAL Maxcine Ham;  Surgeon: Serafina Mitchell, MD;  Location: [facility] CATH LAB;  Service: Cardiovascular;   Laterality: N/A;  . ABDOMINAL AORTIC ANEURYSM REPAIR  JAN 2011   ENDOVASCULAR - DR. BRABHAM  . CARDIAC CATHETERIZATION  07/04/2004   Archie Endo 07/04/2004  . COLONOSCOPY  latest 2004   Dr Sammuel Cooper.  reported in 11/11/2008 PMD note as normal   . CORONARY ANGIOPLASTY WITH STENT PLACEMENT  07/05/2004   notes 07/05/2004  . ESOPHAGOGASTRODUODENOSCOPY N/A 10/12/2016   Procedure: ESOPHAGOGASTRODUODENOSCOPY (EGD);  Surgeon: Doran Stabler, MD;  Location[facility] ENDOSCOPY;  Service: Endoscopy;  Laterality: N/A;  bedside procedure in [facility] [place]  . KNEE ARTHROSCOPY Right   . LITHOTRIPSY    . ROBOT ASSISTED LAPAROSCOPIC NEPHRECTOMY Left 11/06/2013   Procedure: ROBOTIC ASSISTED LAPAROSCOPIC RADICAL  NEPHRECTOMY;  Surgeon: Alexis Frock, MD;  Location: [facility] ORS;  Service: Urology;  Laterality: Left;  . TONSILLECTOMY  1964       Home Medications    Prior to Admission medications   Medication Sig Start Date End Date Taking? Authorizing Provider  albuterol (PROVENTIL HFA;VENTOLIN HFA) 108 (90 Base) MCG/ACT inhaler Inhale 2 puffs into the lungs every 6 (six) hours as needed for wheezing or shortness of breath. 01/26/16  Yes Shelda Pal, DO  atorvastatin (LIPITOR) 40 MG tablet TAKE 1 TABLET ONE TIME DAILY Patient taking differently: Take 40 mg by mouth every evening.  04/06/16  Yes [name], NP  Coenzyme Q10 200 MG capsule Take 200 mg by mouth daily.   Yes [provider]  furosemide (LASIX) 40 MG tablet Take 1 tablet (40 mg total) by mouth daily. 12/08/16  Yes Amin, Jeanella Flattery, MD  isosorbide dinitrate (ISORDIL) 10 MG tablet Take 1 tablet (10 mg total) by mouth 3 (three) times daily. 10/26/16  Yes Elgergawy, Silver Huguenin, MD  levalbuterol Penne Lash) 0.63 MG/3ML nebulizer solution Take 3 mLs (0.63 mg total) by nebulization every 6 (six) hours as needed for wheezing or shortness of breath. 10/26/16  Yes Elgergawy, Silver Huguenin, MD  metoprolol tartrate (LOPRESSOR) 25 MG tablet Take 0.5 tablets (12.5  mg total) by mouth 2 (two) times daily. Patient taking differently: Take 25 mg by mouth 2 (two) times daily.  12/07/16  Yes Amin, Jeanella Flattery, MD  Multiple Vitamins-Minerals (MACUVITE EYE CARE) TABS Take 1 tablet by mouth daily.   Yes [provider]  pantoprazole (PROTONIX) 40 MG tablet Take 1 tablet (40 mg total) by mouth 2 (two) times daily. 10/26/16  Yes Elgergawy, Silver Huguenin, MD  polyethylene glycol (MIRALAX / GLYCOLAX) packet Take 17 g by mouth daily as needed. Patient taking differently: Take 17 g by mouth daily as needed for mild constipation.  10/26/16  Yes Elgergawy, Silver Huguenin, MD  potassium chloride SA (K-DUR,KLOR-CON) 20 MEQ tablet Take 20-40 mEq by mouth See admin instructions. Take 1 tablet on Monday, Wednesday and Friday then take 2 tablets on Tuesday, Thursday, Saturday and Sunday   Yes [provider]  QUEtiapine (SEROQUEL) 50 MG tablet Take 50 mg by mouth 2 (  two) times daily.   Yes [provider]  ranitidine (ZANTAC) 150 MG tablet Take 1 tablet (150 mg total) by mouth 2 (two) times daily. 04/06/16  Yes [name], NP  sertraline (ZOLOFT) 100 MG tablet Take 1 tablet (100 mg total) by mouth every evening. 04/06/16  Yes [name], NP  tamsulosin (FLOMAX) 0.4 MG CAPS capsule TAKE 1 CAPSULE (0.4 MG TOTAL) DAILY AFTER BREAKFAST. Patient taking differently: Take 0.4 mg by mouth daily after breakfast. TAKE 1 CAPSULE (0.4 MG TOTAL) DAILY AFTER BREAKFAST. 09/22/16  Yes [name], NP  traZODone (DESYREL) 50 MG tablet Take 50 mg by mouth at bedtime.    Yes [provider]  traZODone (DESYREL) 50 MG tablet Take 25 mg by mouth as needed (anxiety).   Yes [provider]  warfarin (COUMADIN) 5 MG tablet Take 5-7.5 mg by mouth See admin instructions. Take 5mg  Monday through Friday then  Take 7.5mg  Saturday and Sunday.   Yes [provider]  digoxin 62.5 MCG TABS Please take 0.125 mg oral daily for 1 days, then change to  0.0625 mg oral daily on 6/23. Patient not taking: Reported on 01/01/2017 10/26/16   Elgergawy, Silver Huguenin, MD  OXYGEN Inhale 3 L/min into the lungs continuous.    [provider]    Family History Family History  Problem Relation Age of Onset  . Pulmonary embolism Mother        died of PE  . Heart disease Mother        before age 60  . Anuerysm Father        history of popliteal  . Coronary artery disease Unknown   . Hyperlipidemia Unknown   . Hypertension Unknown   . Prostate cancer Unknown   . Other Neg Hx        polycystic kidney disease, and colon cancer  . Colon cancer Neg Hx     Social History Social History  Substance Use Topics  . Smoking status: Former Smoker    Packs/day: 1.50    Years: 23.00    Types: Cigarettes    Quit date: 08/24/1980  . Smokeless tobacco: Never Used  . Alcohol use 1.5 oz/week    3 Standard drinks or equivalent per week     Comment: 08/14/2016 "maybe 2-3 drinks/month average"     Allergies   Ace inhibitors   Review of Systems Review of Systems  Unable to perform ROS: Dementia     Physical Exam Updated Vital Signs BP (!) 150/89 (BP Location: Left Arm)   Pulse 89   Temp 99.1 F (37.3 C) (Oral)   Resp 18   Ht 5\' 11"  (1.803 m)   Wt 93.6 kg (206 lb 6.4 oz)   SpO2 94%   BMI 28.79 kg/m   Physical Exam  Constitutional: He appears well-developed and well-nourished. No distress.  HENT:  Head: Normocephalic and atraumatic.  Right Ear: Hearing normal.  Left Ear: Hearing normal.  Nose: Nose normal.  Mouth/Throat: Oropharynx is clear and moist and mucous membranes are normal.  Eyes: Pupils are equal, round, and reactive to light. Conjunctivae and EOM are normal.  Neck: Normal range of motion. Neck supple.  Cardiovascular: Regular rhythm, S1 normal and S2 normal.  Exam reveals no gallop and no friction rub.   No murmur heard. Pulmonary/Chest: Effort normal and breath sounds normal. No respiratory distress. He exhibits no  tenderness.  Abdominal: Soft. Normal appearance and bowel sounds are normal. There is no hepatosplenomegaly. There is no  tenderness. There is no rebound, no guarding, no tenderness at McBurney's point and negative Murphy's sign. No hernia.  Musculoskeletal: Normal range of motion.  Neurological: He is alert. He has normal strength. He is disoriented. No cranial nerve deficit or sensory deficit. Coordination normal. GCS eye subscore is 4. GCS verbal subscore is 5. GCS motor subscore is 6.  Skin: Skin is warm, dry and intact. No rash noted. No cyanosis.  Psychiatric: He has a normal mood and affect. His speech is normal and behavior is normal. Thought content normal.  Nursing note and vitals reviewed.    ED Treatments / Results  Labs (all labs ordered are listed, but only abnormal results are displayed) Labs Reviewed - No data to display  EKG  EKG Interpretation None       Radiology Ct Head Wo Contrast  Result Date: 01/01/2017 CLINICAL DATA:  Patient fell earlier today. On known seizure activity. Patient hit back of head while on warfarin. EXAM: CT HEAD WITHOUT CONTRAST TECHNIQUE: Contiguous axial images were obtained from the base of the skull through the vertex without intravenous contrast. COMPARISON:  None. FINDINGS: BRAIN: There is sulcal and ventricular prominence consistent with superficial and central atrophy. No intraparenchymal hemorrhage, mass effect nor midline shift. Periventricular and subcortical white matter hypodensities consistent with chronic small vessel ischemic disease are identified. No acute large vascular territory infarcts. No abnormal extra-axial fluid collections. Basal cisterns are not effaced and midline. VASCULAR: No hyperdense vessels. Mild calcific atherosclerosis of the carotid siphons. SKULL: No skull fracture. No significant scalp soft tissue swelling. SINUSES/ORBITS: The mastoid air-cells are clear. The included paranasal sinuses are well-aerated.The  included ocular globes and orbital contents are non-suspicious. OTHER: None. IMPRESSION: 1. Chronic atrophy and small vessel ischemia. 2. No acute intracranial hemorrhage or edema. 3. No skull fracture. Electronically Signed   By: Ashley Royalty M.D.   On: 01/01/2017 23:45    Procedures Procedures (including critical care time)  Medications Ordered in ED Medications - No data to display   Initial Impression / Assessment and Plan / ED Course  I have reviewed the triage vital signs and the nursing notes.  Pertinent labs & imaging results that were available during my care of the patient were reviewed by me and considered in my medical decision making (see chart for details).     Patient sent to the emergency department for evaluation after a fall. Patient initially was very confused for me during examination, I had to wake him up. After he was awake he became more alert and was able to answer questions. At this time he has no complaints Patient denying back pain which she initially complained of. Examination reveals no tenderness in the cervical spine, thoracic spine, lumbar spine. Patient has been ambulatory here in the ER. Head CT was negative. Patient appropriate for discharge back to skilled nursing facility.  Final Clinical Impressions(s) / ED Diagnoses   Final diagnoses:  Injury of head, initial encounter    New Prescriptions New Prescriptions   No medications on file     Orpah Greek, MD 01/02/17 [phone]

## 2017-01-02 DIAGNOSIS — R4182 Altered mental status, unspecified: Secondary | ICD-10-CM | POA: Diagnosis not present

## 2017-01-02 DIAGNOSIS — S0993XA Unspecified injury of face, initial encounter: Secondary | ICD-10-CM | POA: Diagnosis not present

## 2017-01-02 NOTE — ED Notes (Signed)
[facility] called for transport.  

## 2017-01-02 NOTE — Telephone Encounter (Signed)
Did you get to speak with pt's son yesterday? Do I need try to call him for you?

## 2017-01-02 NOTE — Telephone Encounter (Signed)
I did speak with the patient's son yesterday. I advised him that the patient is now being followed by a physician at the facility. I advised him to discuss CODE STATUS with the physician that his current facility. I updated him on my recent visit and findings. He verbalized understanding and thanked [place] for care.

## 2017-01-03 DIAGNOSIS — F0390 Unspecified dementia without behavioral disturbance: Secondary | ICD-10-CM | POA: Diagnosis not present

## 2017-01-03 DIAGNOSIS — I509 Heart failure, unspecified: Secondary | ICD-10-CM | POA: Diagnosis not present

## 2017-01-03 DIAGNOSIS — R4182 Altered mental status, unspecified: Secondary | ICD-10-CM | POA: Diagnosis not present

## 2017-01-03 DIAGNOSIS — N183 Chronic kidney disease, stage 3 (moderate): Secondary | ICD-10-CM | POA: Diagnosis not present

## 2017-01-04 ENCOUNTER — Ambulatory Visit (INDEPENDENT_AMBULATORY_CARE_PROVIDER_SITE_OTHER): Payer: Medicare HMO | Admitting: Physician Assistant

## 2017-01-04 ENCOUNTER — Encounter: Payer: Self-pay | Admitting: Physician Assistant

## 2017-01-04 VITALS — BP 123/78 | HR 80 | Ht 71.0 in | Wt 210.0 lb

## 2017-01-04 DIAGNOSIS — E785 Hyperlipidemia, unspecified: Secondary | ICD-10-CM

## 2017-01-04 DIAGNOSIS — I2510 Atherosclerotic heart disease of native coronary artery without angina pectoris: Secondary | ICD-10-CM

## 2017-01-04 DIAGNOSIS — I481 Persistent atrial fibrillation: Secondary | ICD-10-CM | POA: Diagnosis not present

## 2017-01-04 DIAGNOSIS — N183 Chronic kidney disease, stage 3 unspecified: Secondary | ICD-10-CM

## 2017-01-04 DIAGNOSIS — I10 Essential (primary) hypertension: Secondary | ICD-10-CM | POA: Diagnosis not present

## 2017-01-04 DIAGNOSIS — I4819 Other persistent atrial fibrillation: Secondary | ICD-10-CM

## 2017-01-04 DIAGNOSIS — G4733 Obstructive sleep apnea (adult) (pediatric): Secondary | ICD-10-CM

## 2017-01-04 DIAGNOSIS — Z9889 Other specified postprocedural states: Secondary | ICD-10-CM | POA: Diagnosis not present

## 2017-01-04 DIAGNOSIS — Z8679 Personal history of other diseases of the circulatory system: Secondary | ICD-10-CM

## 2017-01-04 DIAGNOSIS — F0391 Unspecified dementia with behavioral disturbance: Secondary | ICD-10-CM | POA: Diagnosis not present

## 2017-01-04 MED ORDER — DIGOXIN 62.5 MCG PO TABS: 1.0000 | ORAL_TABLET | Freq: Every day | ORAL | 3 refills | Status: AC

## 2017-01-04 MED ORDER — DIGOXIN 62.5 MCG PO TABS: 1.0000 | ORAL_TABLET | Freq: Every day | ORAL | 3 refills | Status: DC

## 2017-01-04 MED ORDER — FUROSEMIDE 40 MG PO TABS: 60.0000 mg | ORAL_TABLET | Freq: Every day | ORAL | 3 refills | Status: AC

## 2017-01-04 MED ORDER — METOPROLOL TARTRATE 25 MG PO TABS: 12.5000 mg | ORAL_TABLET | Freq: Two times a day (BID) | ORAL | 3 refills | Status: AC

## 2017-01-04 NOTE — Patient Instructions (Addendum)
Medication Instructions:   RESTART DIGOXIN 0.0625 MG ONCE DAILY  DECREASE METOPROLOL TO 12.5 MG TWICE DAILY  TAKE FUROSEMIDE 40 MG TWICE DAILY X 2 DAYS THEN TAKE FUROSEMIDE 60 MG ONCE DAILY  Labwork:  Your physician recommends that you HAVE LAB WORK TODAY  Your physician recommends that you return for lab work in: IN 2 WEEKS=BMET AND DIGOXIN LEVEL PRIOR TO MORNING DOSAGE  Testing/Procedures:  Your physician has recommended that you wear a 24 HOUR holter monitor. Holter monitors are medical devices that record the heart's electrical activity. Doctors most often use these monitors to diagnose arrhythmias. Arrhythmias are problems with the speed or rhythm of the heartbeat. The monitor is a small, portable device. You can wear one while you do your normal daily activities. This is usually used to diagnose what is causing palpitations/syncope (passing out).    Follow-Up:  Your physician recommends that you schedule a follow-up appointment in: 3-4 [place]

## 2017-01-04 NOTE — Progress Notes (Signed)
Cardiology Office Note    Date:  01/06/2017   ID:  Mark [name], DOB 03-Jul-1940, MRN [id number]  PCP:  Mark Alar, NP  Cardiologist:  Dr. Stanford [name]  Chief Complaint  Patient presents with  . Follow-up    seen for Dr. Stanford [name].     History of Present Illness:  Mark [name] is a 76 y.o. 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 He had a low risk Myoview in 2014. Echocardiogram obtained in April 2018 showed EF 20-25% which has dropped significantly from the previous apical in June 2017 which showed EF 60-65%. The drop in the ejection fraction was not worked up due to dementia and the lack of chest pain. He was seen by Dr. Stanford [name] on 09/20/2016, CT of the chest, 24-hour Holter monitor and a Myoview was ordered, however never done. 3 month follow-up echocardiogram was recommended. He was admitted for confusion in June 2018 and later again in June 2018 with GI bleed.  He was admitted nearly end of July 2018 breaths increased agitation and attempted to run away from SNF. He had no chest pain, however did have some dyspnea. He was lethargic with the sitter. BNP was elevated at greater than 2000. He also had atrial fibrillation with RVR. During the admission, he had pauses of 3 seconds, carvedilol was stopped. He was treated with IV Lasix. His carvedilol was switched to low-dose metoprolol to avoid significant sinus pause.  Mark [name] presented for cardiology office evaluation today from [facility] accompanied by [facility] staff. He is still very much confused at this point. He says the year is 2022, he does realize Dr. Stanford [name] is his primary cardiologist but does not know where he is, when asked about who the current president is, he also says it is  Dr. Stanford [name]. He does not remember his only name at this point. He is still full code at this point. There is no family nearby to discuss CODE STATUS. And the patient cannot make his own decision. He is very frail. He is no longer a candidate for Myoview as the study will unlikely change his status. I will recommend a 24-hour Holter monitor to make sure there are no significant sinus pause. He is currently not taking the digoxin, I will restart digoxin and the he will obtain a digoxin level in a week. This should help him control his heart rate. His metoprolol was cut back to 12.5 mg during the recent admission to avoid prolonged pause, however when he went back to [facility], his metoprolol dose was changed back to the previous 25 mg daily. I have lowered it again to 12.5 mg.    Past Medical History:  Diagnosis Date  . AAA (abdominal aortic aneurysm) (HCC)    9.1 cm repair 05/2009 ( ENDOVASCULAR REPAIR ).  MORE RECENT RE-EVALUATION OF ENLARGING AAA BY DR. Trula Slade AND BY DR. Kathlene Cote  MAY 2015 - PT STATES HE UNDERSTANDS THAT NOTHING IS LEAKING AT PRESENT TIME AND HE IS TO FOLLOW UP WITH HIS DOCTORS FOR FUTURE MONITORING I  . Arthritis    LEFT HAND, RT KNEE (08/14/2016)  . Basal cell carcinoma    "right side of my nose; think they burned it off" (08/14/2016)  . CAD (  coronary artery disease)    a. s/p PCI to LAD/diagonal, subtotal of PDA 2006. b. Negative nuc 2014.  Marland Kitchen Chronic atrial fibrillation ([place])   . Chronic systolic CHF (congestive heart failure) (HCC)    a. EF 20-25% dx in 08/2016; etiology not yet clear.  . CKD (chronic kidney disease), stage III    a. stage III-IV, Cr baseline appears 1.8-2.0  . COPD (chronic obstructive pulmonary disease) ([place])   . Depression   . GERD (gastroesophageal reflux disease)   . History of kidney stones   . History of pneumothorax    LONG TIME AGO  . Hyperlipidemia   . Hypertension   . Migraine    "very rare the last 20 years" (08/14/2016)  . Myocardial  infarction ([place]) 2006  . Osteoarthritis   . Renal cell carcinoma ([place])   . Shingles outbreak    PT NOTICED SKIN RASH RT GROIN AND RIGHT TRUNK ON MONDAY 10/27/13 - NOT A LOT OF DISCOMFORT- NOT ON ANY ORAL MEDS TO TREAT.  . Sleep apnea    "dx'd; didn't try mask" (08/14/2016)  . Stroke [facility])    "CT showed I've had old strokes; never knew about it before today" (08/14/2016)    Past Surgical History:  Procedure Laterality Date  . ABDOMINAL AORTAGRAM N/A 09/24/2013   Procedure: ABDOMINAL Maxcine Ham;  Surgeon: Mark Mitchell, MD;  Location: [facility] CATH LAB;  Service: Cardiovascular;  Laterality: N/A;  . ABDOMINAL AORTIC ANEURYSM REPAIR  JAN 2011   ENDOVASCULAR - DR. BRABHAM  . CARDIAC CATHETERIZATION  07/04/2004   Archie Endo 07/04/2004  . COLONOSCOPY  latest 2004   Dr Sammuel [name].  reported in 11/11/2008 PMD note as normal   . CORONARY ANGIOPLASTY WITH STENT PLACEMENT  07/05/2004   notes 07/05/2004  . ESOPHAGOGASTRODUODENOSCOPY N/A 10/12/2016   Procedure: ESOPHAGOGASTRODUODENOSCOPY (EGD);  Surgeon: Mark Stabler, MD;  Location: [facility] ENDOSCOPY;  Service: Endoscopy;  Laterality: N/A;  bedside procedure in [facility] [place]  . KNEE ARTHROSCOPY Right   . LITHOTRIPSY    . ROBOT ASSISTED LAPAROSCOPIC NEPHRECTOMY Left 11/06/2013   Procedure: ROBOTIC ASSISTED LAPAROSCOPIC RADICAL  NEPHRECTOMY;  Surgeon: Mark Frock, MD;  Location: [facility] ORS;  Service: Urology;  Laterality: Left;  . TONSILLECTOMY  1964    Current Medications: Outpatient Medications Prior to Visit  Medication Sig Dispense Refill  . albuterol (PROVENTIL HFA;VENTOLIN HFA) 108 (90 Base) MCG/ACT inhaler Inhale 2 puffs into the lungs every 6 (six) hours as needed for wheezing or shortness of breath. 1 Inhaler 0  . atorvastatin (LIPITOR) 40 MG tablet TAKE 1 TABLET ONE TIME DAILY (Patient taking differently: Take 40 mg by mouth every evening. ) 90 tablet 1  . Coenzyme Q10 200 MG capsule Take 200 mg by mouth daily.    . isosorbide dinitrate (ISORDIL) 10 MG tablet  Take 1 tablet (10 mg total) by mouth 3 (three) times daily.    Marland Kitchen levalbuterol (XOPENEX) 0.63 MG/3ML nebulizer solution Take 3 mLs (0.63 mg total) by nebulization every 6 (six) hours as needed for wheezing or shortness of breath. 3 mL 12  . Multiple Vitamins-Minerals (MACUVITE EYE CARE) TABS Take 1 tablet by mouth daily.    . OXYGEN Inhale 3 L/min into the lungs continuous.    . pantoprazole (PROTONIX) 40 MG tablet Take 1 tablet (40 mg total) by mouth 2 (two) times daily.    . polyethylene glycol (MIRALAX / GLYCOLAX) packet Take 17 g by mouth daily as needed. (Patient taking differently: Take 17 g by mouth daily  as needed for mild constipation. ) 14 each 0  . potassium chloride SA (K-DUR,KLOR-CON) 20 MEQ tablet Take 20-40 mEq by mouth See admin instructions. Take 1 tablet on Monday, Wednesday and Friday then take 2 tablets on Tuesday, Thursday, Saturday and Sunday    . QUEtiapine (SEROQUEL) 50 MG tablet Take 50 mg by mouth 2 (two) times daily.    . tamsulosin (FLOMAX) 0.4 MG CAPS capsule TAKE 1 CAPSULE (0.4 MG TOTAL) DAILY AFTER BREAKFAST. (Patient taking differently: Take 0.4 mg by mouth daily after breakfast. TAKE 1 CAPSULE (0.4 MG TOTAL) DAILY AFTER BREAKFAST.) 90 capsule 1  . traZODone (DESYREL) 50 MG tablet Take 50 mg by mouth at bedtime.     . traZODone (DESYREL) 50 MG tablet Take 25 mg by mouth as needed (anxiety).    . warfarin (COUMADIN) 5 MG tablet Take 5-7.5 mg by mouth See admin instructions. Take 5mg  Monday through Friday then  Take 7.5mg  Saturday and Sunday.    . furosemide (LASIX) 40 MG tablet Take 1 tablet (40 mg total) by mouth daily. 30 tablet 0  . metoprolol tartrate (LOPRESSOR) 25 MG tablet Take 0.5 tablets (12.5 mg total) by mouth 2 (two) times daily. (Patient taking differently: Take 25 mg by mouth 2 (two) times daily. ) 60 tablet 0  . ranitidine (ZANTAC) 150 MG tablet Take 1 tablet (150 mg total) by mouth 2 (two) times daily. (Patient not taking: Reported on 01/04/2017) 180 tablet  1  . sertraline (ZOLOFT) 100 MG tablet Take 1 tablet (100 mg total) by mouth every evening. 90 tablet 1  . digoxin 62.5 MCG TABS Please take 0.125 mg oral daily for 1 days, then change to 0.0625 mg oral daily on 6/23.     No facility-administered medications prior to visit.      Allergies:   Ace inhibitors   Social History   Social History  . Marital status: Divorced    Spouse name: N/A  . Number of children: 2  . Years of education: N/A   Occupational History  .  Retired   Social History Main Topics  . Smoking status: Former Smoker    Packs/day: 1.50    Years: 23.00    Types: Cigarettes    Quit date: 08/24/1980  . Smokeless tobacco: Never Used  . Alcohol use 1.5 oz/week    3 Standard drinks or equivalent per week     Comment: 08/14/2016 "maybe 2-3 drinks/month average"  . Drug use: No  . Sexual activity: Not Asked   Other Topics Concern  . None   Social History Narrative   Retired   Divorced   2 sons 25 &30     Occupation:  Part time sports Tax adviser    Smoking Status:  quit (08/24/1980)   Packs/Day:  1.0    Caffeine use/day:  3 beverages daily    Does Patient Exercise:  no   Alcohol Use - yes     Family History:  The patient's family history includes Anuerysm in his father; Coronary artery disease in his unknown relative; Heart disease in his mother; Hyperlipidemia in his unknown relative; Hypertension in his unknown relative; Prostate cancer in his unknown relative; Pulmonary embolism in his mother.   ROS:   Please see the history of present illness.    ROS All other systems reviewed and are negative.   PHYSICAL EXAM:   VS:  BP 123/78   Pulse 80   Ht 5\' 11"  (1.803 m)   Wt 210 lb (95.3  kg)   BMI 29.29 kg/m    GEN: Well nourished, well developed, in no acute distress  HEENT: normal  Neck: no JVD, carotid bruits, or masses Cardiac: Irregularly irregular; no murmurs, rubs, or gallops. 2-3+ LE edema  Respiratory:  clear to auscultation bilaterally, normal  work of breathing GI: soft, nontender, nondistended, + BS MS: no deformity or atrophy  Skin: warm and dry, no rash Neuro:  Alert and Oriented x 3, Strength and sensation are intact Psych: euthymic mood, full affect  Wt Readings from Last 3 Encounters:  01/04/17 210 lb (95.3 kg)  01/01/17 206 lb 6.4 oz (93.6 kg)  12/22/16 204 lb 12.8 oz (92.9 kg)      Studies/Labs Reviewed:   EKG:  EKG is ordered today.  The ekg ordered today demonstrates Atrial fibrillation, left bundle branch block, heart rate 107  Recent Labs: 12/03/2016: TSH 5.016 12/06/2016: Hemoglobin 9.0; Platelets 194 12/07/2016: B Natriuretic Peptide 2,663.9; Magnesium 2.3 01/04/2017: ALT 10; BUN 45; Creatinine, Ser 2.55; Potassium 4.6; Sodium 145   Lipid Panel    Component Value Date/Time   CHOL 98 10/07/2016 0819   TRIG 93 10/07/2016 0819   HDL 25 (L) 10/07/2016 0819   CHOLHDL 3.9 10/07/2016 0819   VLDL 19 10/07/2016 0819   LDLCALC 54 10/07/2016 0819    Additional studies/ records that were reviewed today include:   Echo 10/13/2015 LV EF: 60% -   65%  Study Conclusions  - Left ventricle: The cavity size was normal. There was mild focal   basal hypertrophy of the septum. Systolic function was normal.   The estimated ejection fraction was in the range of 60% to 65%.   Indeterminant diastolic function (atrial fibrillation). Wall   motion was normal; there were no regional wall motion   abnormalities. - Aortic valve: There was no stenosis. There was trivial   regurgitation. - Aorta: Ascending aorta dimension: 41 mm. Aortic root dimension:   45 mm (ED). - Aortic root: The aortic root was dilated. - Ascending aorta: The ascending aorta was mildly dilated. - Mitral valve: Mildly calcified annulus. There was mild   regurgitation. - Left atrium: The atrium was moderately dilated. - Right ventricle: The cavity size was normal. Systolic function   was mildly reduced. - Right atrium: The atrium was moderately  dilated. - Pulmonary arteries: PA peak pressure: 24 mm Hg (S). - Inferior vena cava: The vessel was normal in size. The   respirophasic diameter changes were in the normal range (= 50%),   consistent with normal central venous pressure.  Impressions:  - The patient was in atrial fibrillation. There was mild focal   basal septal hypertrophy. Normal LV systolic function, EF 62-70%.   Normal RV size with mildly reduced systolic function. Moderate   biatrial enlargement. Dilated aortic root and ascending aorta.      Echo 08/15/2016 LV EF: 20% -   25%  Study Conclusions  - Left ventricle: The cavity size was mildly dilated. Wall   thickness was increased in a pattern of mild LVH. There was   moderate focal basal hypertrophy of the septum. Systolic function   was severely reduced. The estimated ejection fraction was in the   range of 20% to 25%. Diffuse hypokinesis. The study is not   technically sufficient to allow evaluation of LV diastolic   function. - Aortic valve: There was mild regurgitation. - Aortic root: The aortic root was mildly dilated. - Ascending aorta: The ascending aorta was mildly dilated. -  Mitral valve: There was moderate regurgitation. - Left atrium: The atrium was severely dilated. - Right ventricle: The cavity size was mildly dilated. - Right atrium: The atrium was moderately dilated.  Impressions:  - Severe LV systolic dysfunction; mild AI; mildly dilated aortic   root (4.6 cm; suggest CTA or MRA to further assess); moderate MR;   biatrial enlargement; mild RVE; mild TR.    Echo 12/04/2016 LV EF: 25% -   30%  Study Conclusions  - Left ventricle: The cavity size was normal. There was moderate   concentric hypertrophy. Systolic function was severely reduced.   The estimated ejection fraction was in the range of 25% to 30%.   There is hypokinesis of the anteroseptal myocardium. - Ventricular septum: Septal motion showed abnormal function  and   dyssynergy. - Aortic valve: There was mild regurgitation. - Aorta: Aortic root dimension: 42 mm (ED). - Ascending aorta: The ascending aorta was mildly dilated. - Mitral valve: Calcified annulus. There was moderate   regurgitation. - Left atrium: The atrium was moderately dilated. - Right atrium: The atrium was severely dilated. - Pericardium, extracardiac: There was a left pleural effusion.  Impressions:  - Compared to the prior study, there has been no significant   interval change.   ASSESSMENT:    1. Persistent atrial fibrillation ([place])   2. Coronary artery disease involving native coronary artery of native heart without angina pectoris   3. S/P AAA repair   4. CKD (chronic kidney disease), stage III   5. Essential hypertension   6. Hyperlipidemia, unspecified hyperlipidemia type   7. OSA (obstructive sleep apnea)   8. Dementia with behavioral disturbance, unspecified dementia type      PLAN:  In order of problems listed above:  1. Permanent atrial fibrillation: his metoprolol was reduced to 12.5mg  BID during recent admission, however after he returned to [facility], he was restarted at 12.5 mg twice a day. He is currently not taking any digoxin either. I have returned the metoprolol to 12.5 mg twice a day and restarted digoxin. He will need a one-week digoxin level. He will also need a 24 hour Holter monitor to assess for prolonged pause, during the recent admission, he had pauses up to 3 seconds.  2. CAD: He was supposed to have a Myoview in May, however this was delayed. He is no longer a candidate for Myoview given his current mental status. He is unable to make any reasonable decision by himself.  3. CKD stage III: Check basic metabolic panel  4. Hypertension: Blood pressure stable, his blood pressure medication has been reconciled with our record  5. Hyperlipidemia: On Lipitor 40 mg daily  6. Dementia: Likely have some degree of persistent encephalopathy  from recent admission. His CODE STATUS will need to be discussed, however no family is available.    Medication Adjustments/Labs and Tests Ordered: Current medicines are reviewed at length with the patient today.  Concerns regarding medicines are outlined above.  Medication changes, Labs and Tests ordered today are listed in the Patient Instructions below. Patient Instructions  Medication Instructions:   RESTART DIGOXIN 0.0625 MG ONCE DAILY  DECREASE METOPROLOL TO 12.5 MG TWICE DAILY  TAKE FUROSEMIDE 40 MG TWICE DAILY X 2 DAYS THEN TAKE FUROSEMIDE 60 MG ONCE DAILY  Labwork:  Your physician recommends that you HAVE LAB WORK TODAY  Your physician recommends that you return for lab work in: IN 2 WEEKS=BMET AND DIGOXIN LEVEL PRIOR TO MORNING DOSAGE  Testing/Procedures:  Your physician  has recommended that you wear a 24 HOUR holter monitor. Holter monitors are medical devices that record the heart's electrical activity. Doctors most often use these monitors to diagnose arrhythmias. Arrhythmias are problems with the speed or rhythm of the heartbeat. The monitor is a small, portable device. You can wear one while you do your normal daily activities. This is usually used to diagnose what is causing palpitations/syncope (passing out).    Follow-Up:  Your physician recommends that you schedule a follow-up appointment in: 3-4 [place]      Signed, Almyra Deforest, [place]  01/06/2017 2:19 PM    [facility] [place], [place], [place]  [address] Phone: [phone]; Fax: [phone]

## 2017-01-05 ENCOUNTER — Telehealth: Payer: Self-pay | Admitting: Physician Assistant

## 2017-01-05 DIAGNOSIS — Z0000 Encounter for general adult medical examination without abnormal findings: Secondary | ICD-10-CM | POA: Diagnosis not present

## 2017-01-05 LAB — COMPREHENSIVE METABOLIC PANEL
ALBUMIN: 3.3 g/dL — AB (ref 3.5–4.8)
ALK PHOS: 115 IU/L (ref 39–117)
ALT: 10 IU/L (ref 0–44)
AST: 21 IU/L (ref 0–40)
Albumin/Globulin Ratio: 1.5 (ref 1.2–2.2)
BUN / CREAT RATIO: 18 (ref 10–24)
BUN: 45 mg/dL — ABNORMAL HIGH (ref 8–27)
Bilirubin Total: 1.5 mg/dL — ABNORMAL HIGH (ref 0.0–1.2)
CALCIUM: 8.4 mg/dL — AB (ref 8.6–10.2)
CO2: 20 mmol/L (ref 20–29)
CREATININE: 2.55 mg/dL — AB (ref 0.76–1.27)
Chloride: 110 mmol/L — ABNORMAL HIGH (ref 96–106)
GFR, EST AFRICAN AMERICAN: 28 mL/min/{1.73_m2} — AB (ref >59)
GFR, EST NON AFRICAN AMERICAN: 24 mL/min/{1.73_m2} — AB (ref >59)
GLOBULIN, TOTAL: 2.2 g/dL (ref 1.5–4.5)
Glucose: 107 mg/dL — ABNORMAL HIGH (ref 65–99)
Potassium: 4.6 mmol/L (ref 3.5–5.2)
SODIUM: 145 mmol/L — AB (ref 134–144)
TOTAL PROTEIN: 5.5 g/dL — AB (ref 6.0–8.5)

## 2017-01-05 NOTE — Telephone Encounter (Signed)
Spoke with NURSE BODE. NEED CLARIFICATION ON AFTER SUMMARY VISIT PAPER QUESTION ANSWERED - 1  CMP DONE 01/05/17 2)  BMP, DIG LEVEL MAYBE DONE AT [facility] AND SEND RESULTS. 3) APPOINTMENT ANS ADDRESS FOR HOLTER MONITOR  01/22/17   11:30 AM GIVEN. 4) FOLLOW UP APPOINTMENT GIVEN 01/31/17 AT 10:30 AM  TO SEE HAO MENG.  VERBALIZED UNDERSTANDING

## 2017-01-05 NOTE — Telephone Encounter (Signed)
ATTEMPT TO CALL [facility] - ANSWER BUT REMAINED ON HOLD THEN DISCONNECTED X3 WILL TRY LATER

## 2017-01-05 NOTE — Telephone Encounter (Signed)
Cloyd Stagers (with [employer]) calling, has a some questions in regards to patient appt yesterday. Please call and ask for Bode (bo-dey)

## 2017-01-06 ENCOUNTER — Encounter: Payer: Self-pay | Admitting: Physician Assistant

## 2017-01-10 DIAGNOSIS — Z0000 Encounter for general adult medical examination without abnormal findings: Secondary | ICD-10-CM | POA: Diagnosis not present

## 2017-01-10 NOTE — Addendum Note (Signed)
Addended by: Milderd Meager on: 01/10/2017 08:49 AM   Modules accepted: Orders

## 2017-01-18 DIAGNOSIS — R600 Localized edema: Secondary | ICD-10-CM | POA: Diagnosis not present

## 2017-01-18 DIAGNOSIS — N183 Chronic kidney disease, stage 3 (moderate): Secondary | ICD-10-CM | POA: Diagnosis not present

## 2017-01-18 DIAGNOSIS — I509 Heart failure, unspecified: Secondary | ICD-10-CM | POA: Diagnosis not present

## 2017-01-22 ENCOUNTER — Ambulatory Visit (INDEPENDENT_AMBULATORY_CARE_PROVIDER_SITE_OTHER): Payer: Medicare HMO

## 2017-01-22 DIAGNOSIS — I481 Persistent atrial fibrillation: Secondary | ICD-10-CM

## 2017-01-22 DIAGNOSIS — I4819 Other persistent atrial fibrillation: Secondary | ICD-10-CM

## 2017-01-23 DIAGNOSIS — I10 Essential (primary) hypertension: Secondary | ICD-10-CM | POA: Diagnosis not present

## 2017-01-24 DIAGNOSIS — I10 Essential (primary) hypertension: Secondary | ICD-10-CM | POA: Diagnosis not present

## 2017-01-25 ENCOUNTER — Ambulatory Visit: Payer: Self-pay | Admitting: Physician Assistant

## 2017-01-26 DIAGNOSIS — F0151 Vascular dementia with behavioral disturbance: Secondary | ICD-10-CM | POA: Diagnosis not present

## 2017-01-26 DIAGNOSIS — I679 Cerebrovascular disease, unspecified: Secondary | ICD-10-CM | POA: Diagnosis not present

## 2017-01-26 DIAGNOSIS — I10 Essential (primary) hypertension: Secondary | ICD-10-CM | POA: Diagnosis not present

## 2017-01-26 DIAGNOSIS — I4891 Unspecified atrial fibrillation: Secondary | ICD-10-CM | POA: Diagnosis not present

## 2017-01-26 DIAGNOSIS — I519 Heart disease, unspecified: Secondary | ICD-10-CM | POA: Diagnosis not present

## 2017-01-26 DIAGNOSIS — I509 Heart failure, unspecified: Secondary | ICD-10-CM | POA: Diagnosis not present

## 2017-01-26 DIAGNOSIS — N184 Chronic kidney disease, stage 4 (severe): Secondary | ICD-10-CM | POA: Diagnosis not present

## 2017-01-26 DIAGNOSIS — I25810 Atherosclerosis of coronary artery bypass graft(s) without angina pectoris: Secondary | ICD-10-CM | POA: Diagnosis not present

## 2017-01-26 DIAGNOSIS — I447 Left bundle-branch block, unspecified: Secondary | ICD-10-CM | POA: Diagnosis not present

## 2017-01-31 ENCOUNTER — Ambulatory Visit: Payer: Medicare HMO | Admitting: Physician Assistant

## 2017-01-31 ENCOUNTER — Telehealth: Payer: Self-pay

## 2017-01-31 NOTE — Telephone Encounter (Signed)
February 07, 2017 @356pm - spoke with [facility] pt's emergency contact she states that pt passed away this morning  02-02-2017 207pm S/w hospice nurse Cyril Mourning [phone] pt was started on hospice 9-21 due to declination in mental status and combativeness. Mark [name] is his ex-wife and is taking over since pt is unable to due to the dementia. Per kristen she states that pt has been transferred to the dementia unit at [facility] and is declined and is not eating very much  Pt is a resident on dementia unit at [facility]: [phone] spoke with medical records at [facility] pt had cbc w/diff and CMP done on 9-19 she states that she will fax results right over gave her our 2 fax numbers. Our medical records notified they will bring faxed lab to [facility] for review as soon as they get if, I will check with them later.   Mark [name] (ex-wife-she is a nurse in urgent care) [phone]; spoke with wife(DPR) she states that mentally pt has declined a great deal also and she states that pt had INR done yesterday and it was too high and they are holding the warfarin. Pt had 2+ pitting edema ans was increased on his lasix and the swelling went down and is back to his regular dosing. Mark states that she was wondering about his kidney function and would like to know lab results also. She states that pt is a hospice pt now and does not think at this point he needs follow up appt at this time. We will wait for the results to be faxed from [facility] and go from there. Informed Almyra Deforest PA-C of above and he agrees to wait for faxed labs for next steps to do. He states that pt needs CMP and digoxin level if these are not done in the faxed results.

## 2017-02-01 DIAGNOSIS — I10 Essential (primary) hypertension: Secondary | ICD-10-CM | POA: Diagnosis not present

## 2017-02-01 DIAGNOSIS — F322 Major depressive disorder, single episode, severe without psychotic features: Secondary | ICD-10-CM | POA: Diagnosis not present

## 2017-02-01 DIAGNOSIS — I4891 Unspecified atrial fibrillation: Secondary | ICD-10-CM | POA: Diagnosis not present

## 2017-02-01 DIAGNOSIS — F0391 Unspecified dementia with behavioral disturbance: Secondary | ICD-10-CM | POA: Diagnosis not present

## 2017-02-01 DIAGNOSIS — N184 Chronic kidney disease, stage 4 (severe): Secondary | ICD-10-CM | POA: Diagnosis not present

## 2017-02-01 DIAGNOSIS — I714 Abdominal aortic aneurysm, without rupture: Secondary | ICD-10-CM | POA: Diagnosis not present

## 2017-02-01 DIAGNOSIS — J449 Chronic obstructive pulmonary disease, unspecified: Secondary | ICD-10-CM | POA: Diagnosis not present

## 2017-02-01 DIAGNOSIS — F29 Unspecified psychosis not due to a substance or known physiological condition: Secondary | ICD-10-CM | POA: Diagnosis not present

## 2017-02-01 DIAGNOSIS — I509 Heart failure, unspecified: Secondary | ICD-10-CM | POA: Diagnosis not present

## 2017-02-02 ENCOUNTER — Encounter: Payer: Self-pay | Admitting: *Deleted

## 2017-03-08 DEATH — deceased

## 2017-04-16 ENCOUNTER — Other Ambulatory Visit (HOSPITAL_COMMUNITY): Payer: Self-pay

## 2017-04-16 ENCOUNTER — Ambulatory Visit: Payer: Self-pay | Admitting: Surgery

## 2018-07-20 IMAGING — CR DG CHEST 2V
2 series · 2 of 2 positions shown · non-contrast
Comparison: September 15, 2016 and September 01, 2016

CLINICAL DATA: Cough and congestion for 2 weeks.  Chest pain.

EXAM:
CHEST  2 VIEW

[w chest pa]
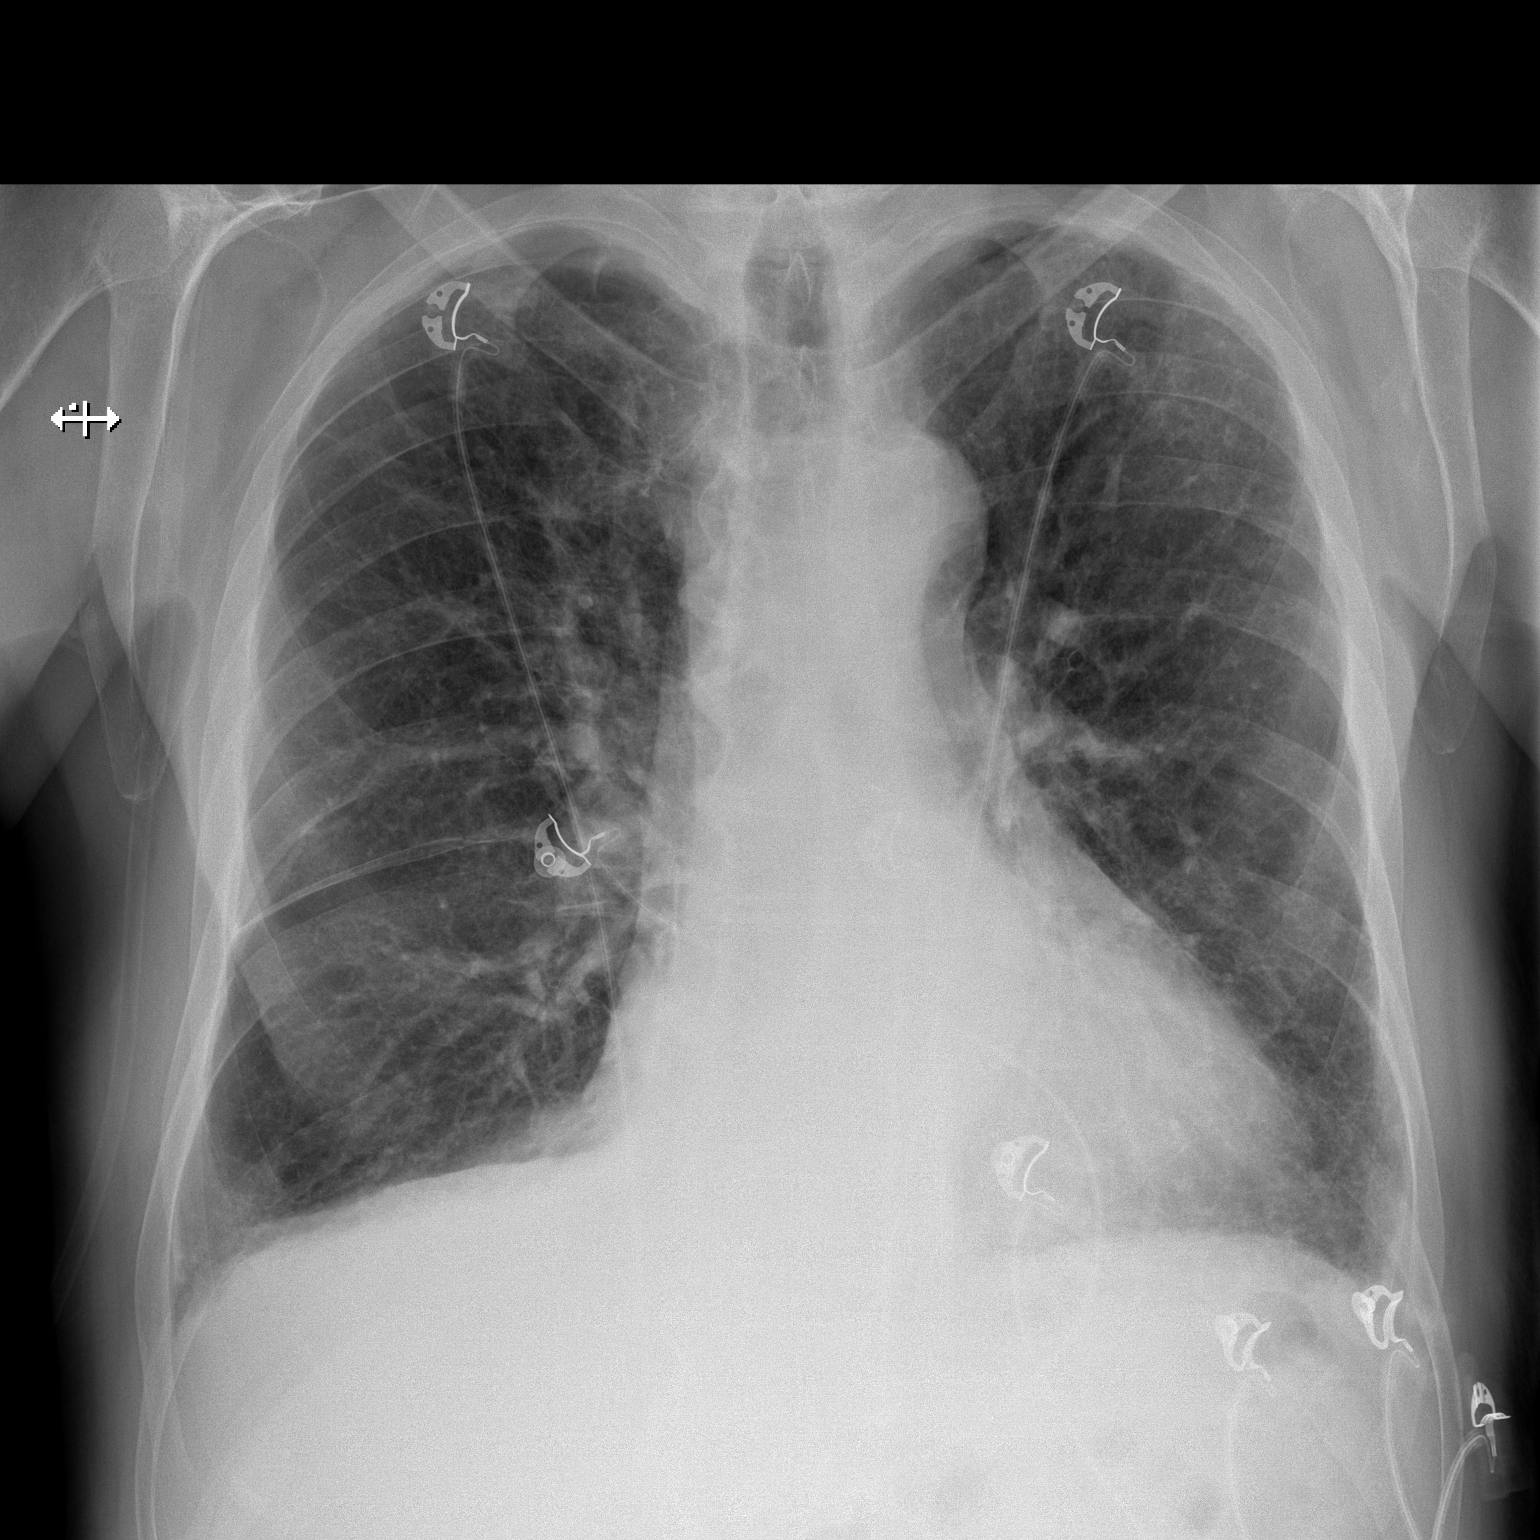

[w chest lat]
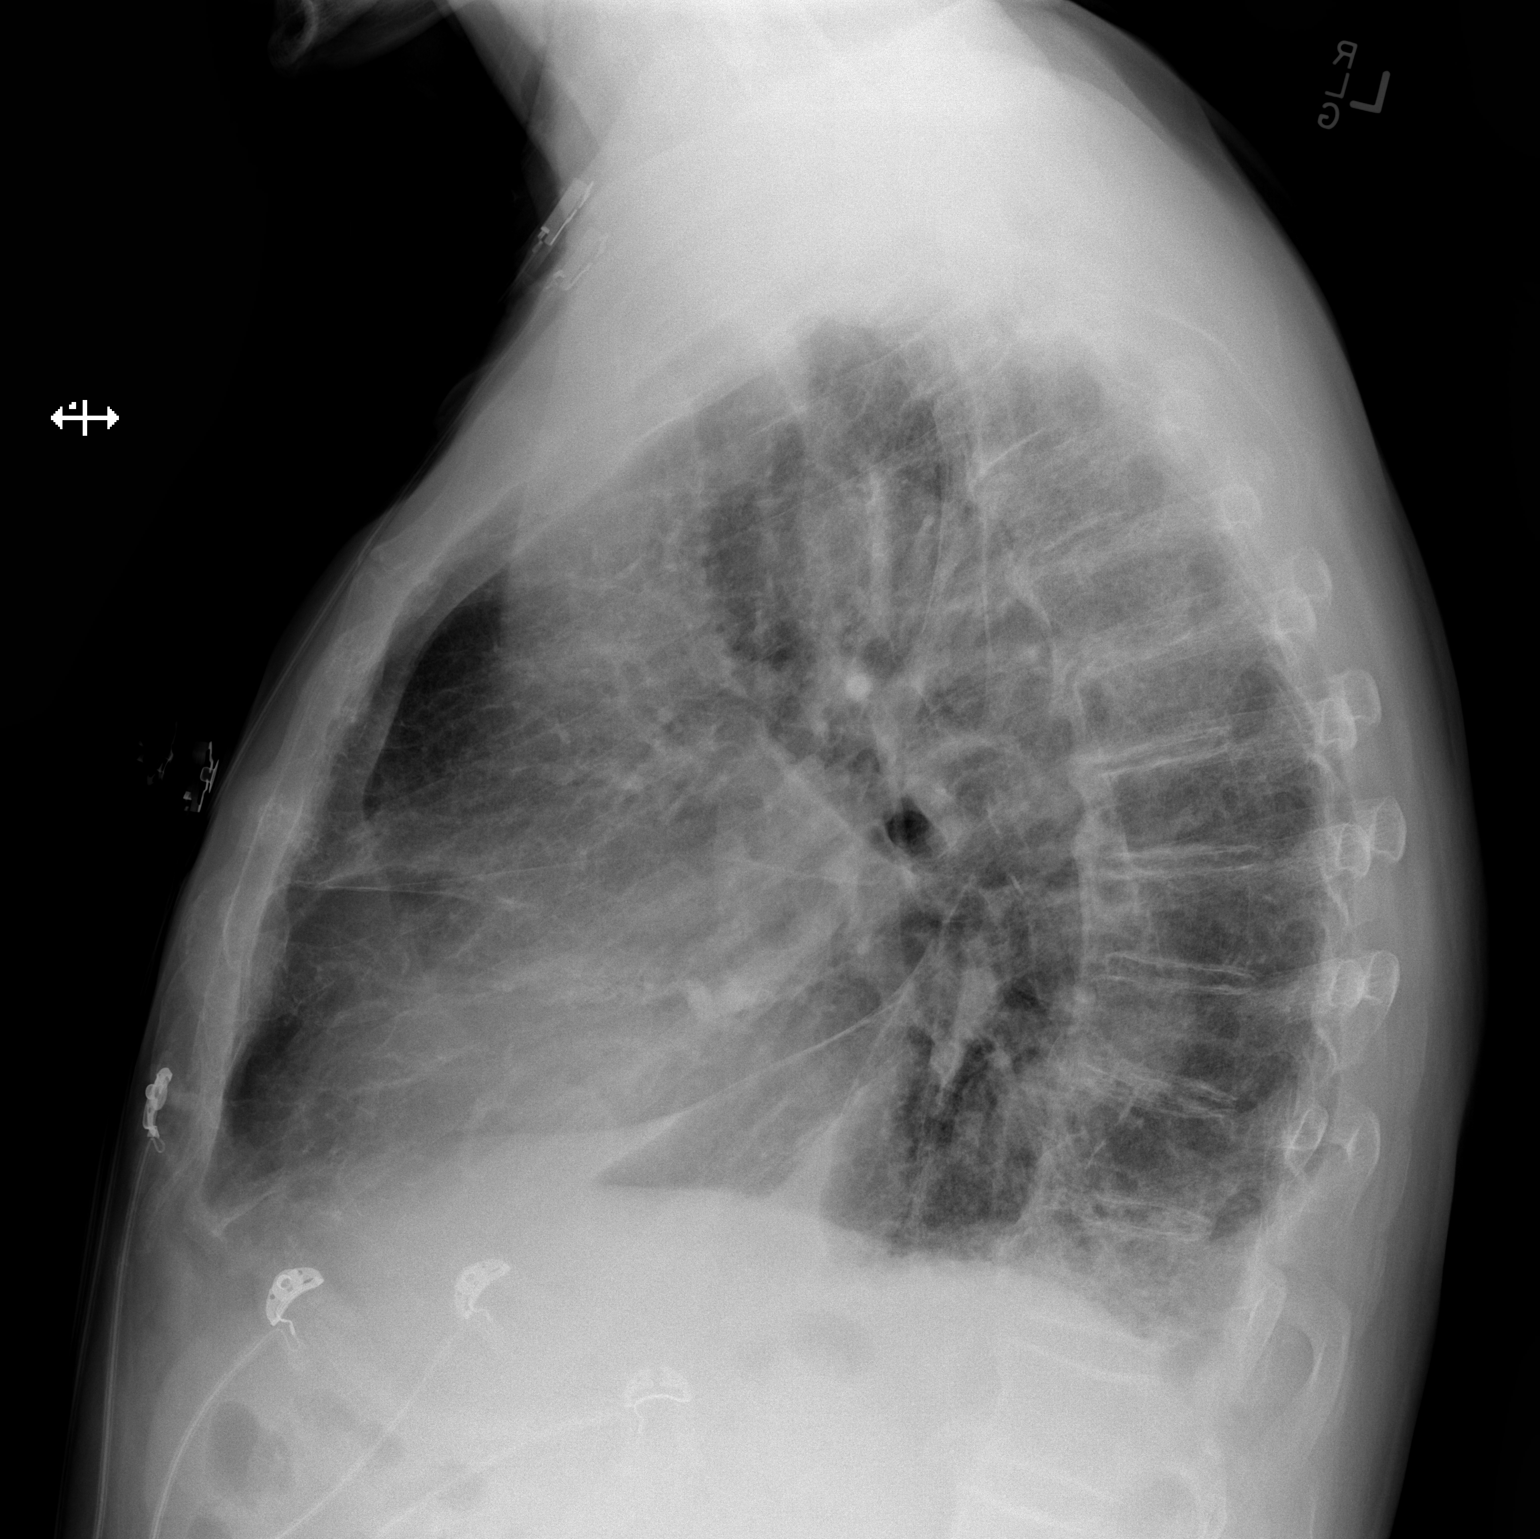

[2 of 2 positions shown; findings below may reference images not displayed]

FINDINGS: There is stable interstitial prominence, most notably in the left
upper lobe and both bases, likely due to underlying fibrotic type
change. There is no appreciable edema or consolidation. Heart is
borderline prominent with pulmonary vascularity within normal
limits. No evident adenopathy. There is degenerative change in the
thoracic spine with diffuse idiopathic skeletal hyperostosis.
IMPRESSION: Areas of underlying scarring/ fibrotic change without frank edema or
consolidation. Stable cardiac prominence. There is diffuse
idiopathic skeletal hyperostosis in the thoracic spine. No evident
adenopathy. There is no appreciable change compared to recent
studies.

## 2018-07-20 IMAGING — MR MR HEAD W/O CM
9 of 10 series · 36 of 48 positions shown · non-contrast
Comparison: Head CT without contrast 1912 hours today. Brain MRI
08/14/2016.

CLINICAL DATA: 75-year-old male with confusion and slurred speech
today. Possible dementia.

EXAM:
MRI HEAD WITHOUT CONTRAST
TECHNIQUE: Multiplanar, multiecho pulse sequences of the brain and surrounding
structures were obtained without intravenous contrast.

[Series 3: T1 · sagittal · 5.0mm · 0.47mm/px · 3 of 25 slices shown]
[im 1/25]
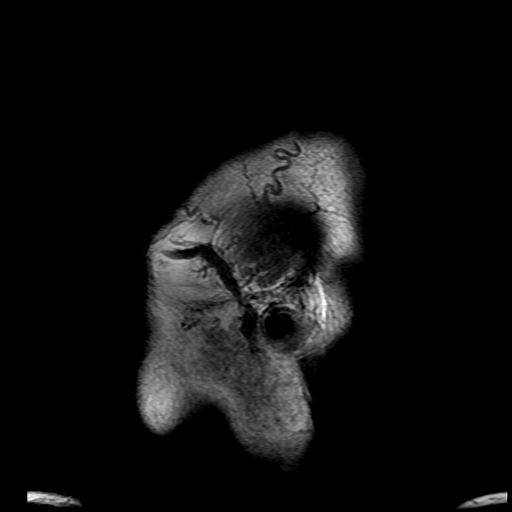
[im 13/25]
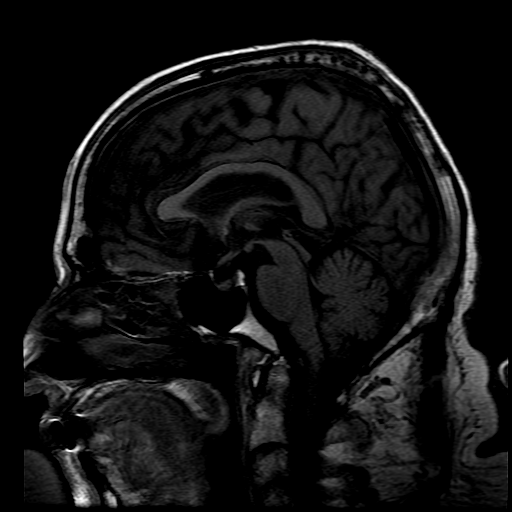
[im 25/25]
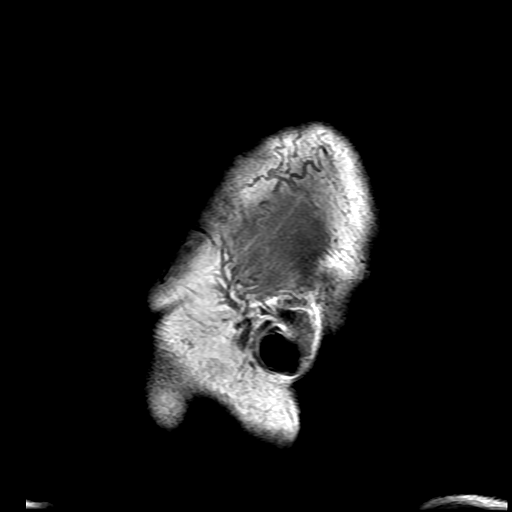

[Series 4: DWI · axial · 3.0mm · 1.09mm/px · z∈[-21,+147]mm · 8 of 116 slices shown (1 of 4)]
[im 1/116]
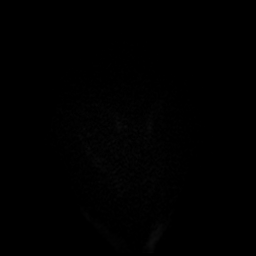
[im 13/116]
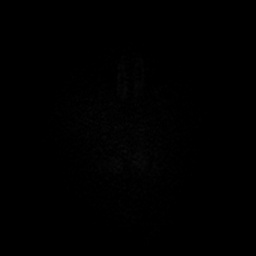
[im 39/116]
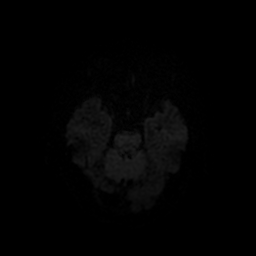
[im 52/116]
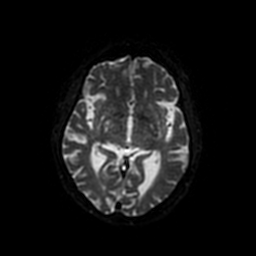
[im 64/116]
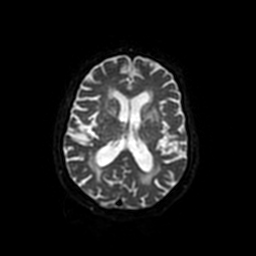
[im 77/116]
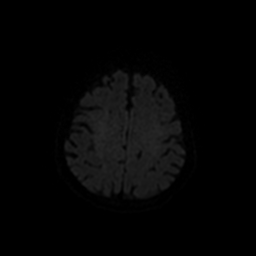
[im 103/116]
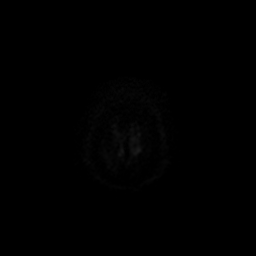
[im 116/116]
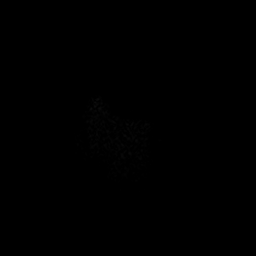

[Series 5: DWI · coronal · 5.0mm · 1.09mm/px · 9 of 156 slices shown (2 of 4)]
[im 1/156]
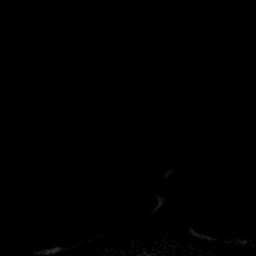
[im 26/156]
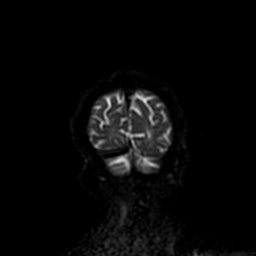
[im 52/156]
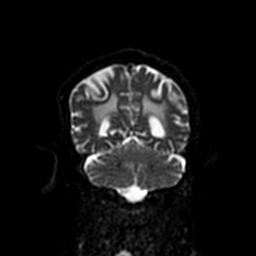
[im 65/156]
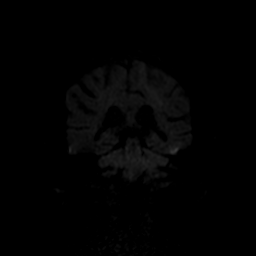
[im 78/156]
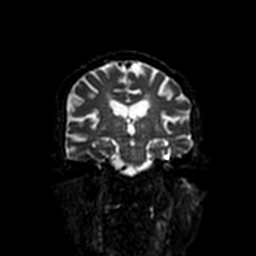
[im 91/156]
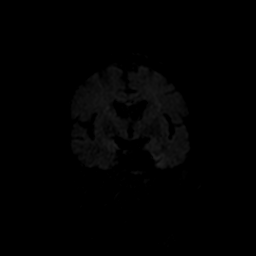
[im 104/156]
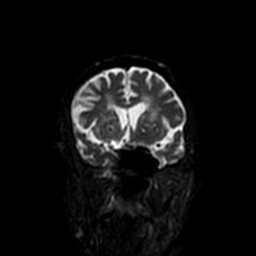
[im 130/156]
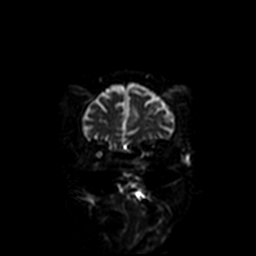
[im 156/156]
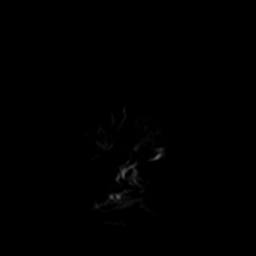

[Series 6: T2 · axial · 5.0mm · 0.43mm/px · z∈[-30,+134]mm · 2 of 25 slices shown (1 of 2)]
[im 1/25]
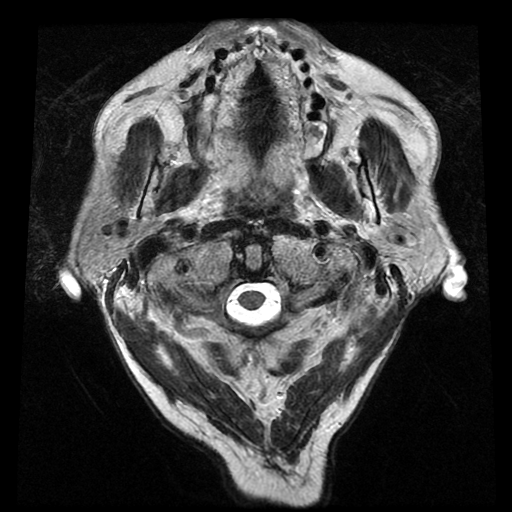
[im 25/25]
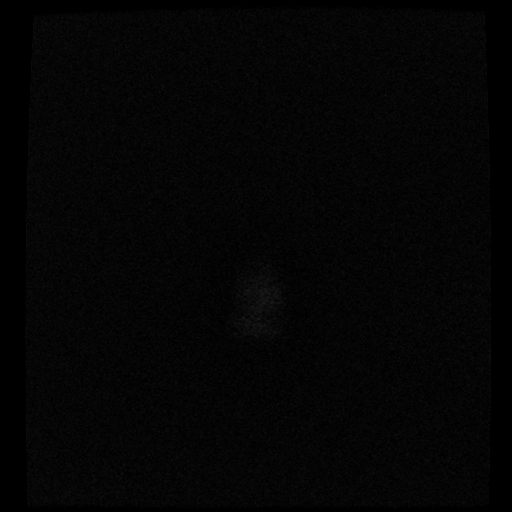

[Series 7: FLAIR · axial · 3.0mm · 0.43mm/px · z∈[-33,+131]mm · 2 of 29 slices shown]
[im 1/29]
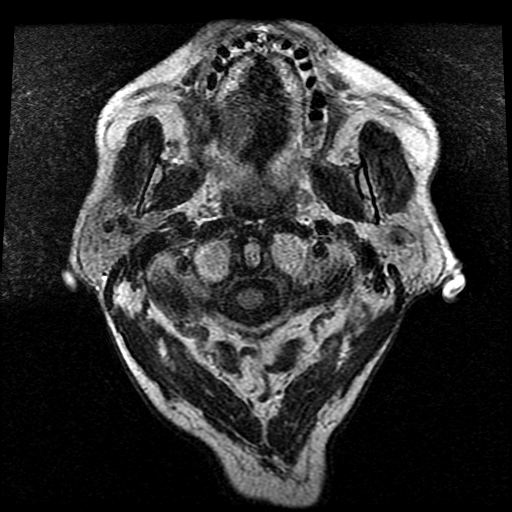
[im 29/29]
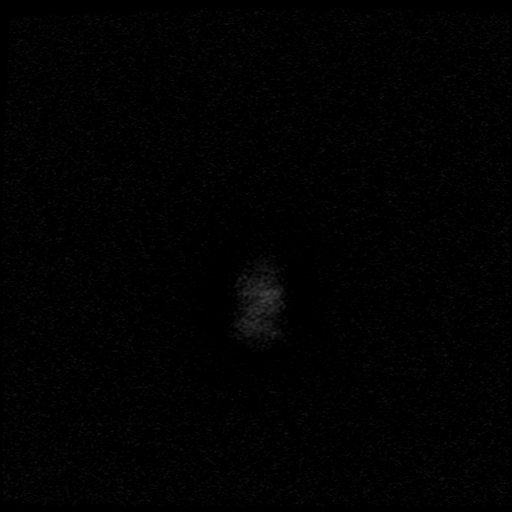

[Series 8: ax mpgr · axial · 5.0mm · 0.43mm/px · 1 of 24 slices shown]
[im 1/24]
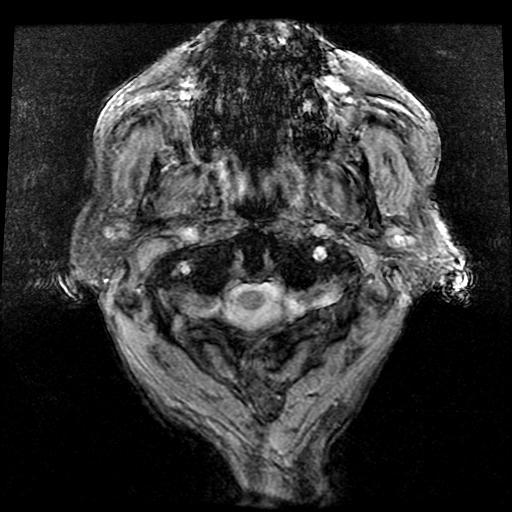

[Series 10: T2 · coronal · 5.0mm · 0.45mm/px · 3 of 30 slices shown (2 of 2)]
[im 1/30]
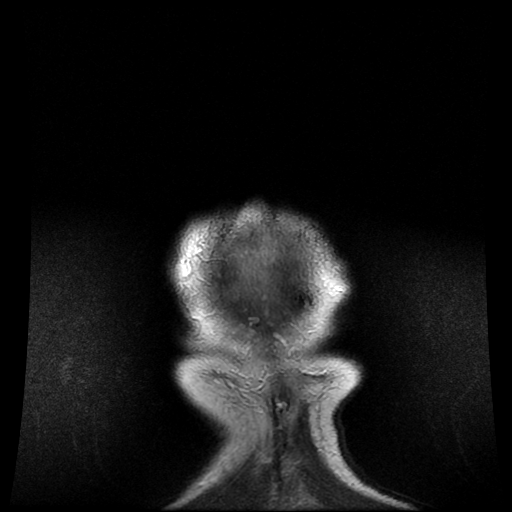
[im 15/30]
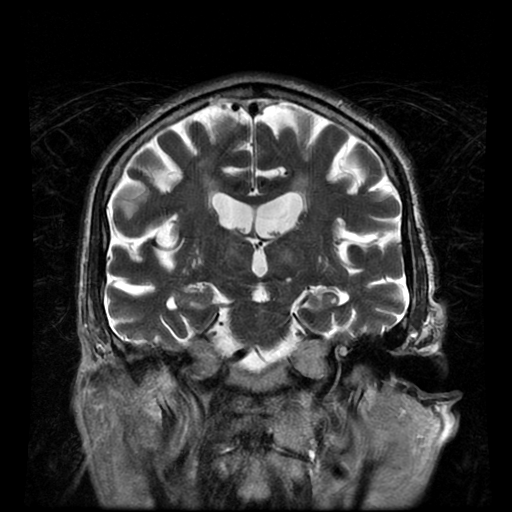
[im 30/30]
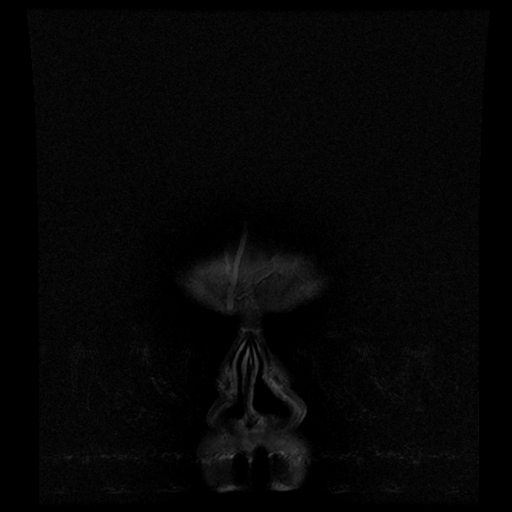

[Series 400: DWI · axial · 3.0mm · 1.09mm/px · z∈[-21,+144]mm · 5 of 57 slices shown (3 of 4)]
[im 1/57]
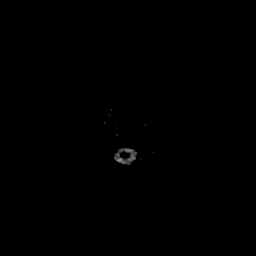
[im 15/57]
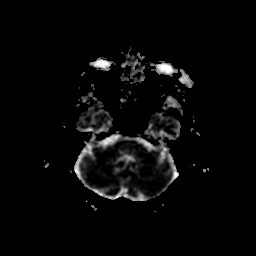
[im 29/57]
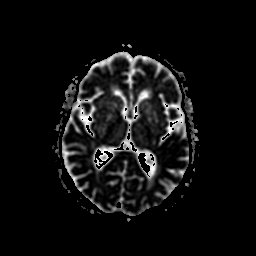
[im 43/57]
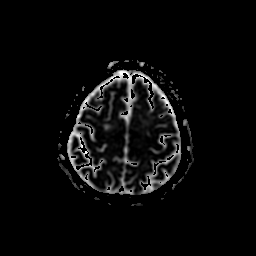
[im 57/57]
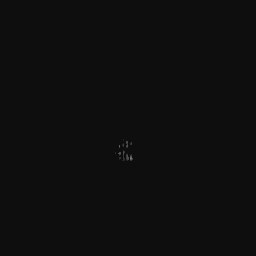

[Series 500: DWI · coronal · 5.0mm · 1.09mm/px · 3 of 39 slices shown (4 of 4)]
[im 1/39]
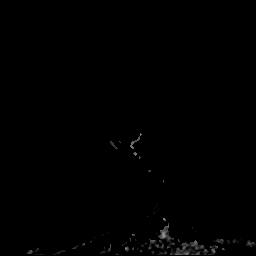
[im 20/39]
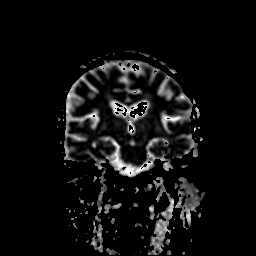
[im 39/39]
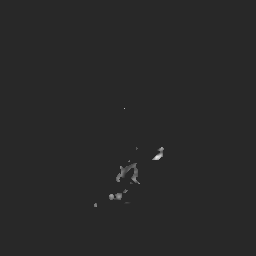

[36 of 48 positions shown; findings below may reference images not displayed]

FINDINGS: Brain: No restricted diffusion to suggest acute infarction. No
midline shift, mass effect, evidence of mass lesion,
ventriculomegaly, extra-axial collection or acute intracranial
hemorrhage. Cervicomedullary junction and pituitary are within
normal limits.

Confluent bilateral cerebral white matter T2 and FLAIR
hyperintensity with several small areas which most resemble chronic
white matter lacunar infarcts. Similar widespread T2 heterogeneity
throughout the deep gray matter nuclei, the midbrain and pons, in
part related to perivascular spaces. No new signal abnormality
identified. Occasional chronic micro hemorrhages in the brain ir
stable (right parietal lobe, left posterior temporal lobe). No new
signal abnormality identified.

Vascular: Major intracranial vascular flow voids are stable with
generalized intracranial artery dolichoectasia.

Skull and upper cervical spine: Negative. Normal bone marrow signal.

Sinuses/Orbits: Normal orbits soft tissues. Stable paranasal sinuses
and mastoids, with occasional mucosal thickening and small mucous
retention cysts.

Other: Visible internal auditory structures appear normal. Negative
scalp soft tissues.
IMPRESSION: 1. Stable noncontrast MRI appearance of the brain since [place] with
no acute intracranial abnormality.
2. Widespread signal changes again suggestive of advanced chronic
small vessel disease. Consider vascular causes of dementia.
